# Patient Record
Sex: Male | Born: 1945 | Hispanic: Yes | Smoking: Never smoker
Health system: Southern US, Community
[De-identification: ages and names within clinical notes are randomized; demographics above are authoritative.]

## PROBLEM LIST (undated history)

## (undated) DIAGNOSIS — E119 Type 2 diabetes mellitus without complications: Secondary | ICD-10-CM

## (undated) DIAGNOSIS — B89 Unspecified parasitic disease: Secondary | ICD-10-CM

## (undated) DIAGNOSIS — F32A Depression, unspecified: Secondary | ICD-10-CM

## (undated) HISTORY — PX: NO PAST SURGERIES: SHX2092

---

## 2002-07-18 ENCOUNTER — Encounter: Payer: Self-pay | Admitting: Emergency Medicine

## 2002-07-18 ENCOUNTER — Emergency Department (HOSPITAL_COMMUNITY): Admission: EM | Admit: 2002-07-18 | Discharge: 2002-07-18 | Payer: Self-pay | Admitting: Emergency Medicine

## 2007-05-05 ENCOUNTER — Emergency Department (HOSPITAL_COMMUNITY): Admission: EM | Admit: 2007-05-05 | Discharge: 2007-05-06 | Payer: Self-pay | Admitting: Emergency Medicine

## 2007-05-05 IMAGING — CT CT PELVIS W/O CM
3 of 4 series · 13 of 42 positions shown, 19 images · IV contrast (agent unspecified)
Comparison: None available.

CLINICAL DATA: Abdominal and pelvic pain.  Right flank pain.
ABDOMEN CT WITHOUT CONTRAST:
TECHNIQUE: Multidetector CT imaging of the abdomen was performed following the standard protocol without IV contrast.
TECHNIQUE: Multidetector CT imaging of the pelvis was performed following the standard protocol without IV contrast.

[Series 2: routine abdomen · axial · 0.70mm/px · z∈[-406,-112]mm · 8 of 77 slices shown, 13 images]
[im 9/77  soft-tissue]
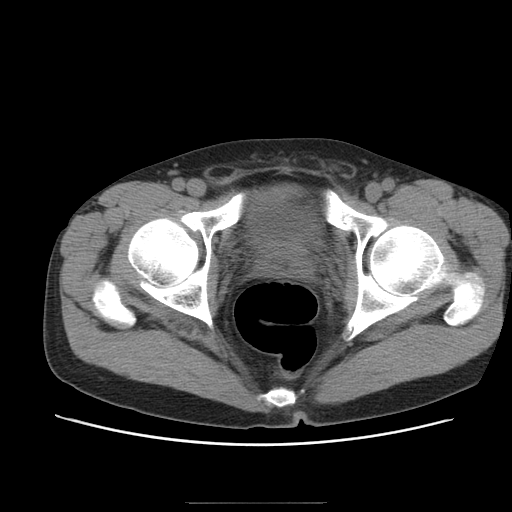
[im 9/77  bone]
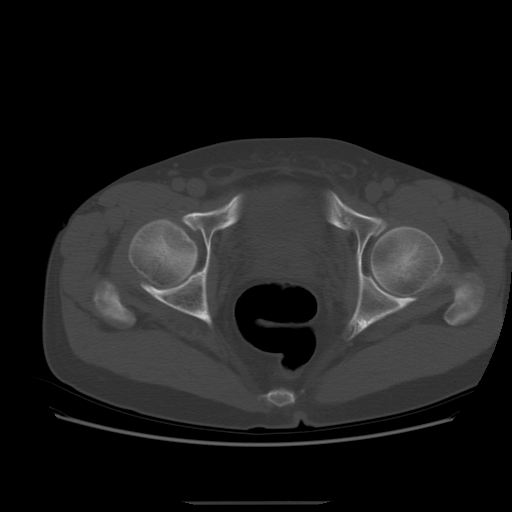
[im 17/77  soft-tissue]
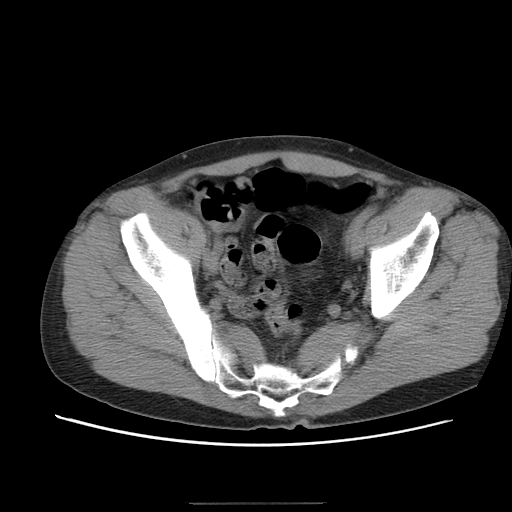
[im 26/77  soft-tissue]
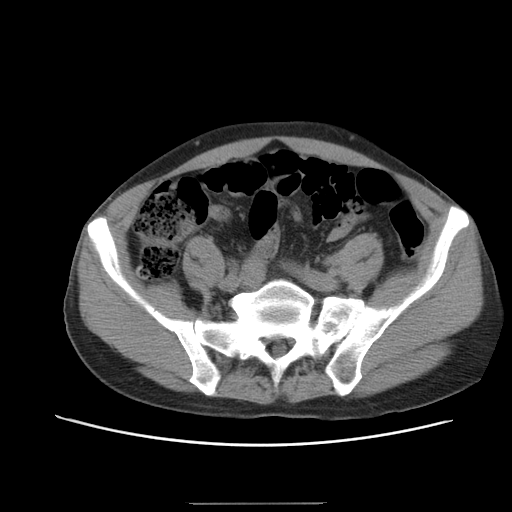
[im 34/77  soft-tissue]
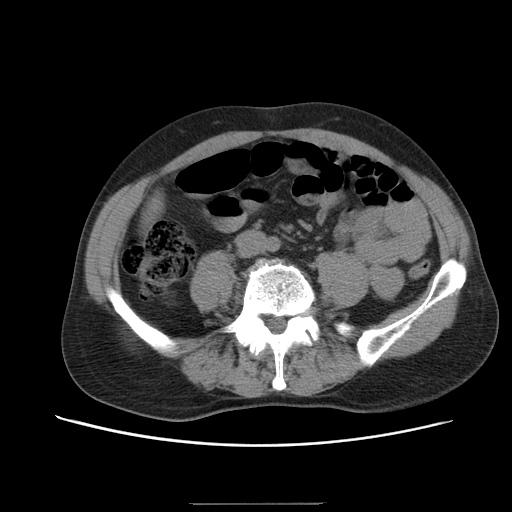
[im 43/77  soft-tissue]
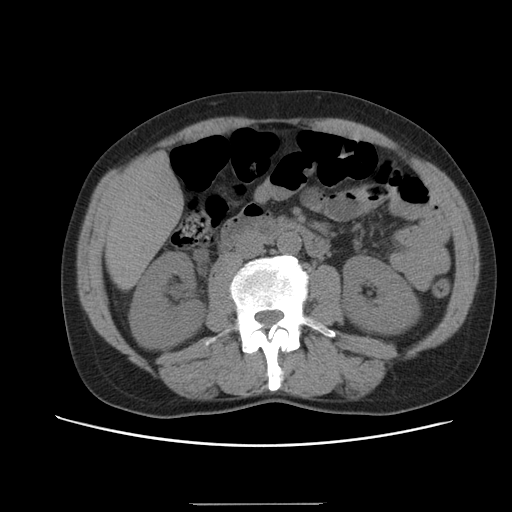
[im 43/77  lung]
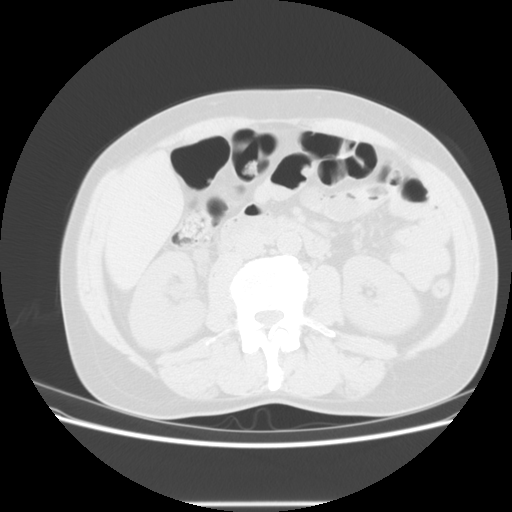
[im 51/77  soft-tissue]
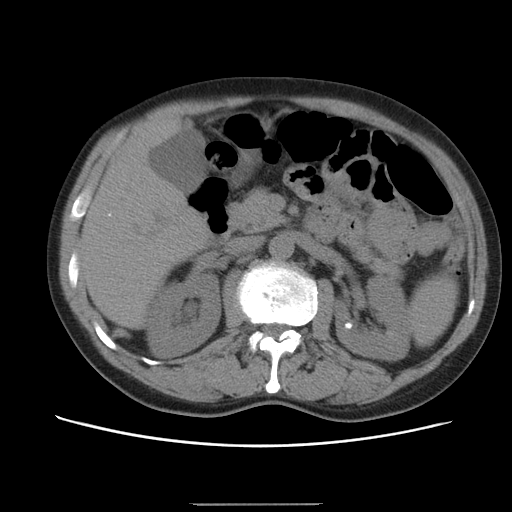
[im 51/77  lung]
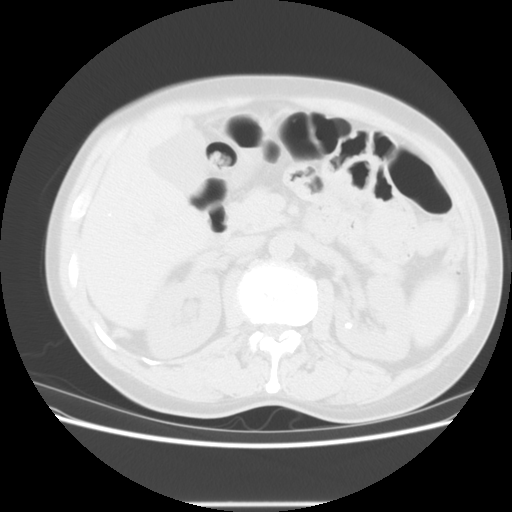
[im 60/77  soft-tissue]
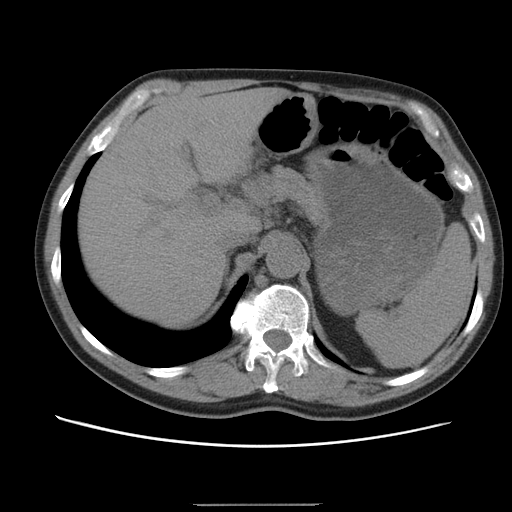
[im 60/77  lung]
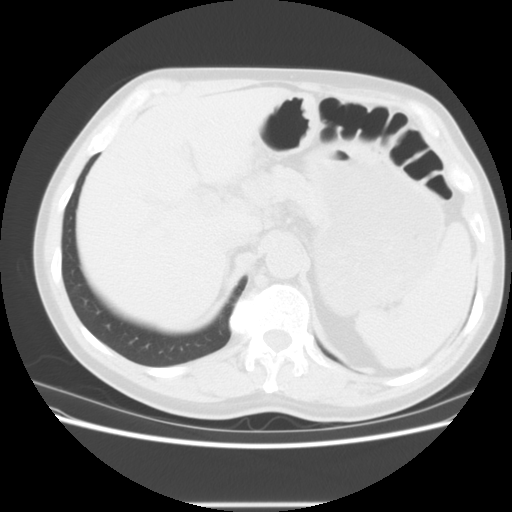
[im 68/77  soft-tissue]
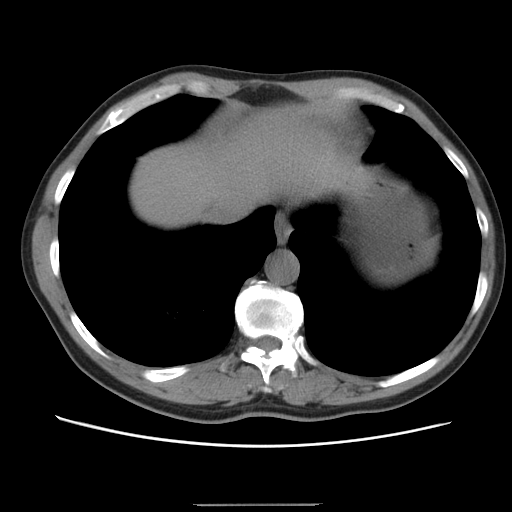
[im 68/77  lung]
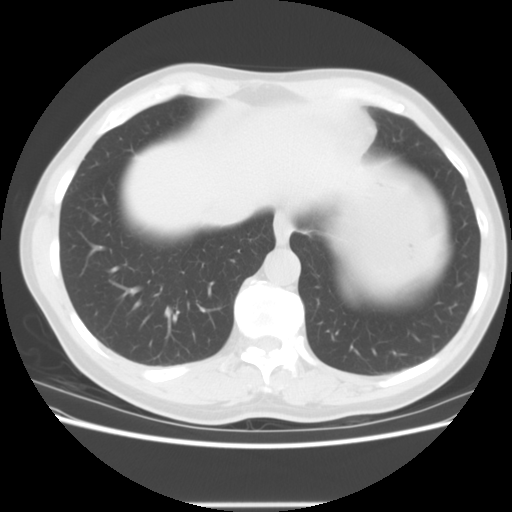

[Series 400: sag · sagittal · 0.82mm/px · 3 of 164 slices shown, 4 images]
[im 55/164  soft-tissue]
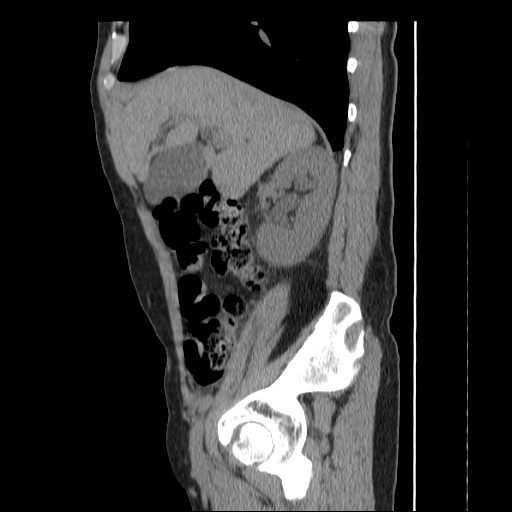
[im 73/164  soft-tissue]
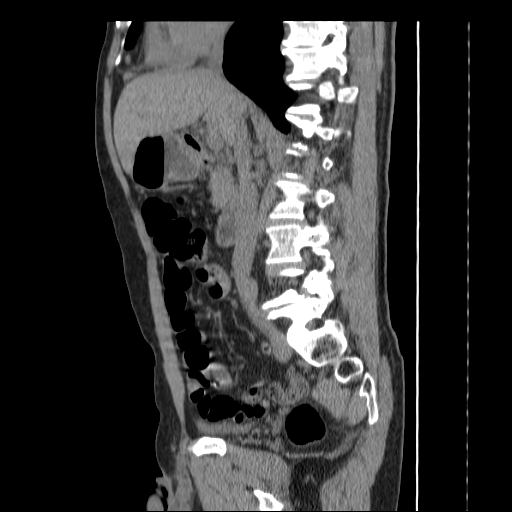
[im 73/164  bone]
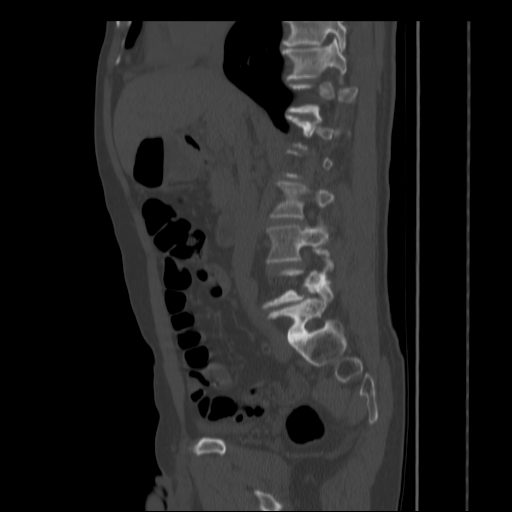
[im 91/164  soft-tissue]
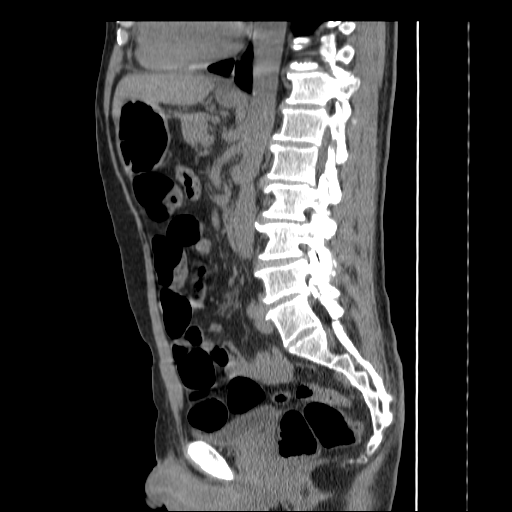

[Series 401: cor · coronal · 0.82mm/px · 2 of 128 slices shown]
[im 15/128  soft-tissue]
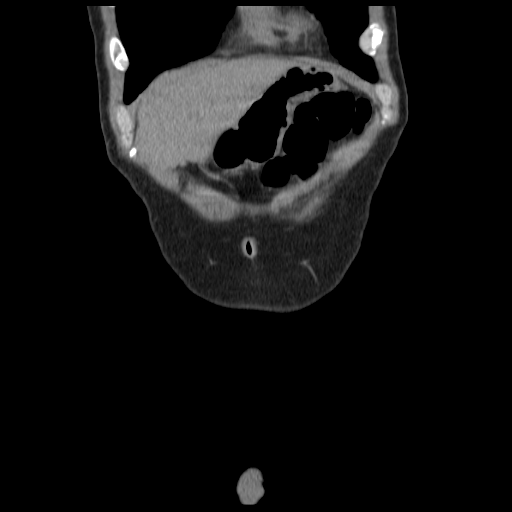
[im 30/128  soft-tissue]
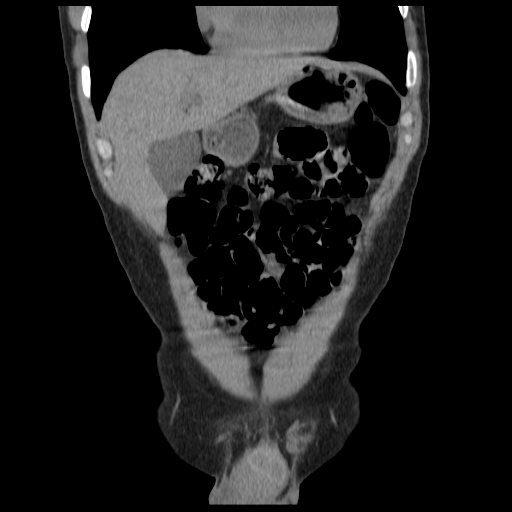

[13 of 42 positions shown; findings below may reference images not displayed]

FINDINGS: The liver, spleen, adrenal glands, pancreas, gallbladder are unremarkable.  There is a 5 mm nonobstructing calcification within the left mid kidney, but the remainder of the left kidney is unremarkable.  There is mild right hydronephrosis caused by a 1.5 mm mid right ureteral calculus (image 46).  No evidence of free fluid, enlarged lymph nodes, abdominal aortic aneurysm, or biliary dilatation.  The visualized bowel is within normal limits.  Please note that parenchymal abnormalities may be missed as intravenous contrast was not administered.
IMPRESSION: 1.5 mm mid right ureteral calculus with mild right hydronephrosis. 
PELVIS CT WITHOUT CONTRAST:
FINDINGS: The bladder and bowel are within normal limits.  No evidence of free fluid or enlarged lymph nodes.  The appendix is unremarkable.  Moderate-severe degenerative changes throughout the lumbar spine are noted.
IMPRESSION: 1. No acute abnormality within the pelvis.
2. Moderate to severe degenerative changes within the lumbar spine.

## 2011-08-01 LAB — CBC
HCT: 40
Hemoglobin: 13.5
MCHC: 33.7
MCV: 95.9
RDW: 11.9

## 2011-08-01 LAB — URINALYSIS, ROUTINE W REFLEX MICROSCOPIC
Bilirubin Urine: NEGATIVE
Glucose, UA: NEGATIVE
Ketones, ur: 15 — AB
Leukocytes, UA: NEGATIVE
Nitrite: NEGATIVE
Protein, ur: NEGATIVE
Specific Gravity, Urine: 1.017
Urobilinogen, UA: 0.2
pH: 7.5

## 2011-08-01 LAB — DIFFERENTIAL
Basophils Absolute: 0
Basophils Relative: 0
Eosinophils Absolute: 0.1
Eosinophils Relative: 2
Monocytes Absolute: 0.5
Neutro Abs: 4.2

## 2011-08-01 LAB — I-STAT 8, (EC8 V) (CONVERTED LAB)
Acid-Base Excess: 1
TCO2: 28
pCO2, Ven: 45.1
pH, Ven: 7.378 — ABNORMAL HIGH

## 2011-08-01 LAB — URINE MICROSCOPIC-ADD ON

## 2016-02-08 ENCOUNTER — Emergency Department (HOSPITAL_COMMUNITY): Payer: Medicare Other

## 2016-02-08 ENCOUNTER — Encounter (HOSPITAL_COMMUNITY): Payer: Self-pay | Admitting: Emergency Medicine

## 2016-02-08 ENCOUNTER — Emergency Department (HOSPITAL_COMMUNITY)
Admission: EM | Admit: 2016-02-08 | Discharge: 2016-02-08 | Disposition: A | Discharge status: Home / Self Care | Payer: Medicare Other | Attending: Emergency Medicine | Admitting: Emergency Medicine

## 2016-02-08 DIAGNOSIS — R200 Anesthesia of skin: Secondary | ICD-10-CM | POA: Diagnosis not present

## 2016-02-08 DIAGNOSIS — R079 Chest pain, unspecified: Secondary | ICD-10-CM | POA: Insufficient documentation

## 2016-02-08 DIAGNOSIS — Z79899 Other long term (current) drug therapy: Secondary | ICD-10-CM | POA: Insufficient documentation

## 2016-02-08 DIAGNOSIS — M5441 Lumbago with sciatica, right side: Secondary | ICD-10-CM | POA: Insufficient documentation

## 2016-02-08 DIAGNOSIS — Z7982 Long term (current) use of aspirin: Secondary | ICD-10-CM | POA: Insufficient documentation

## 2016-02-08 DIAGNOSIS — M79604 Pain in right leg: Secondary | ICD-10-CM | POA: Insufficient documentation

## 2016-02-08 DIAGNOSIS — M545 Low back pain: Secondary | ICD-10-CM | POA: Diagnosis present

## 2016-02-08 LAB — BASIC METABOLIC PANEL
ANION GAP: 9 (ref 5–15)
BUN: 14 mg/dL (ref 6–20)
CALCIUM: 9.7 mg/dL (ref 8.9–10.3)
CHLORIDE: 101 mmol/L (ref 101–111)
CO2: 25 mmol/L (ref 22–32)
Creatinine, Ser: 0.75 mg/dL (ref 0.61–1.24)
GFR calc non Af Amer: >60 mL/min (ref >60)
Glucose, Bld: 111 mg/dL — ABNORMAL HIGH (ref 65–99)
Potassium: 4.3 mmol/L (ref 3.5–5.1)
Sodium: 135 mmol/L (ref 135–145)

## 2016-02-08 LAB — CBC
HCT: 39.4 % (ref 39.0–52.0)
HEMOGLOBIN: 14 g/dL (ref 13.0–17.0)
MCH: 32.2 pg (ref 26.0–34.0)
MCHC: 35.5 g/dL (ref 30.0–36.0)
MCV: 90.6 fL (ref 78.0–100.0)
Platelets: 101 10*3/uL — ABNORMAL LOW (ref 150–400)
RBC: 4.35 MIL/uL (ref 4.22–5.81)
RDW: 12.1 % (ref 11.5–15.5)
WBC: 4.8 10*3/uL (ref 4.0–10.5)

## 2016-02-08 LAB — I-STAT TROPONIN, ED: TROPONIN I, POC: 0 ng/mL (ref 0.00–0.08)

## 2016-02-08 IMAGING — DX DG CHEST 2V
2 series · 2 of 2 positions shown · non-contrast
Comparison: None.

CLINICAL DATA: Left chest pain.

EXAM:
CHEST  2 VIEW

[chest pa]
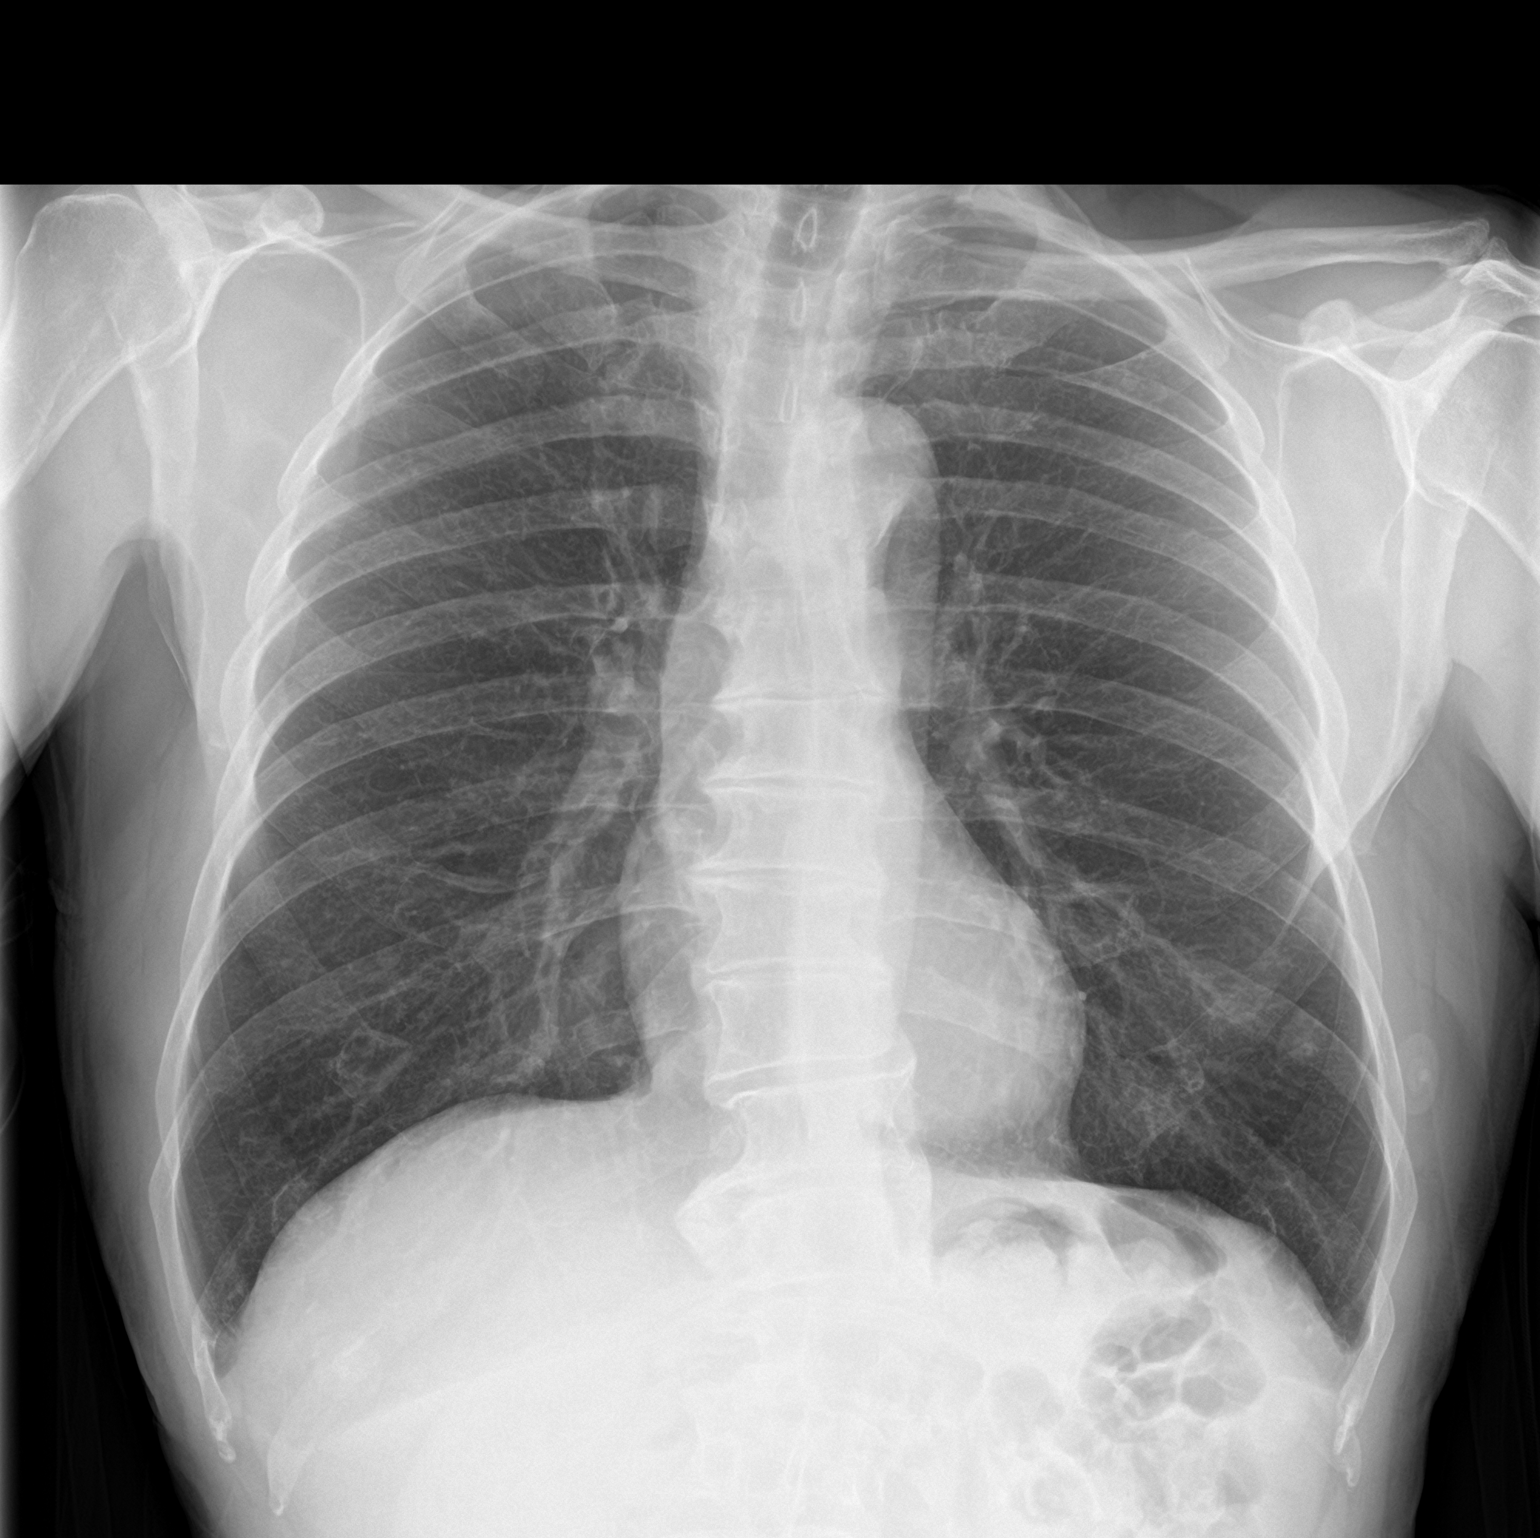

[chest lat]
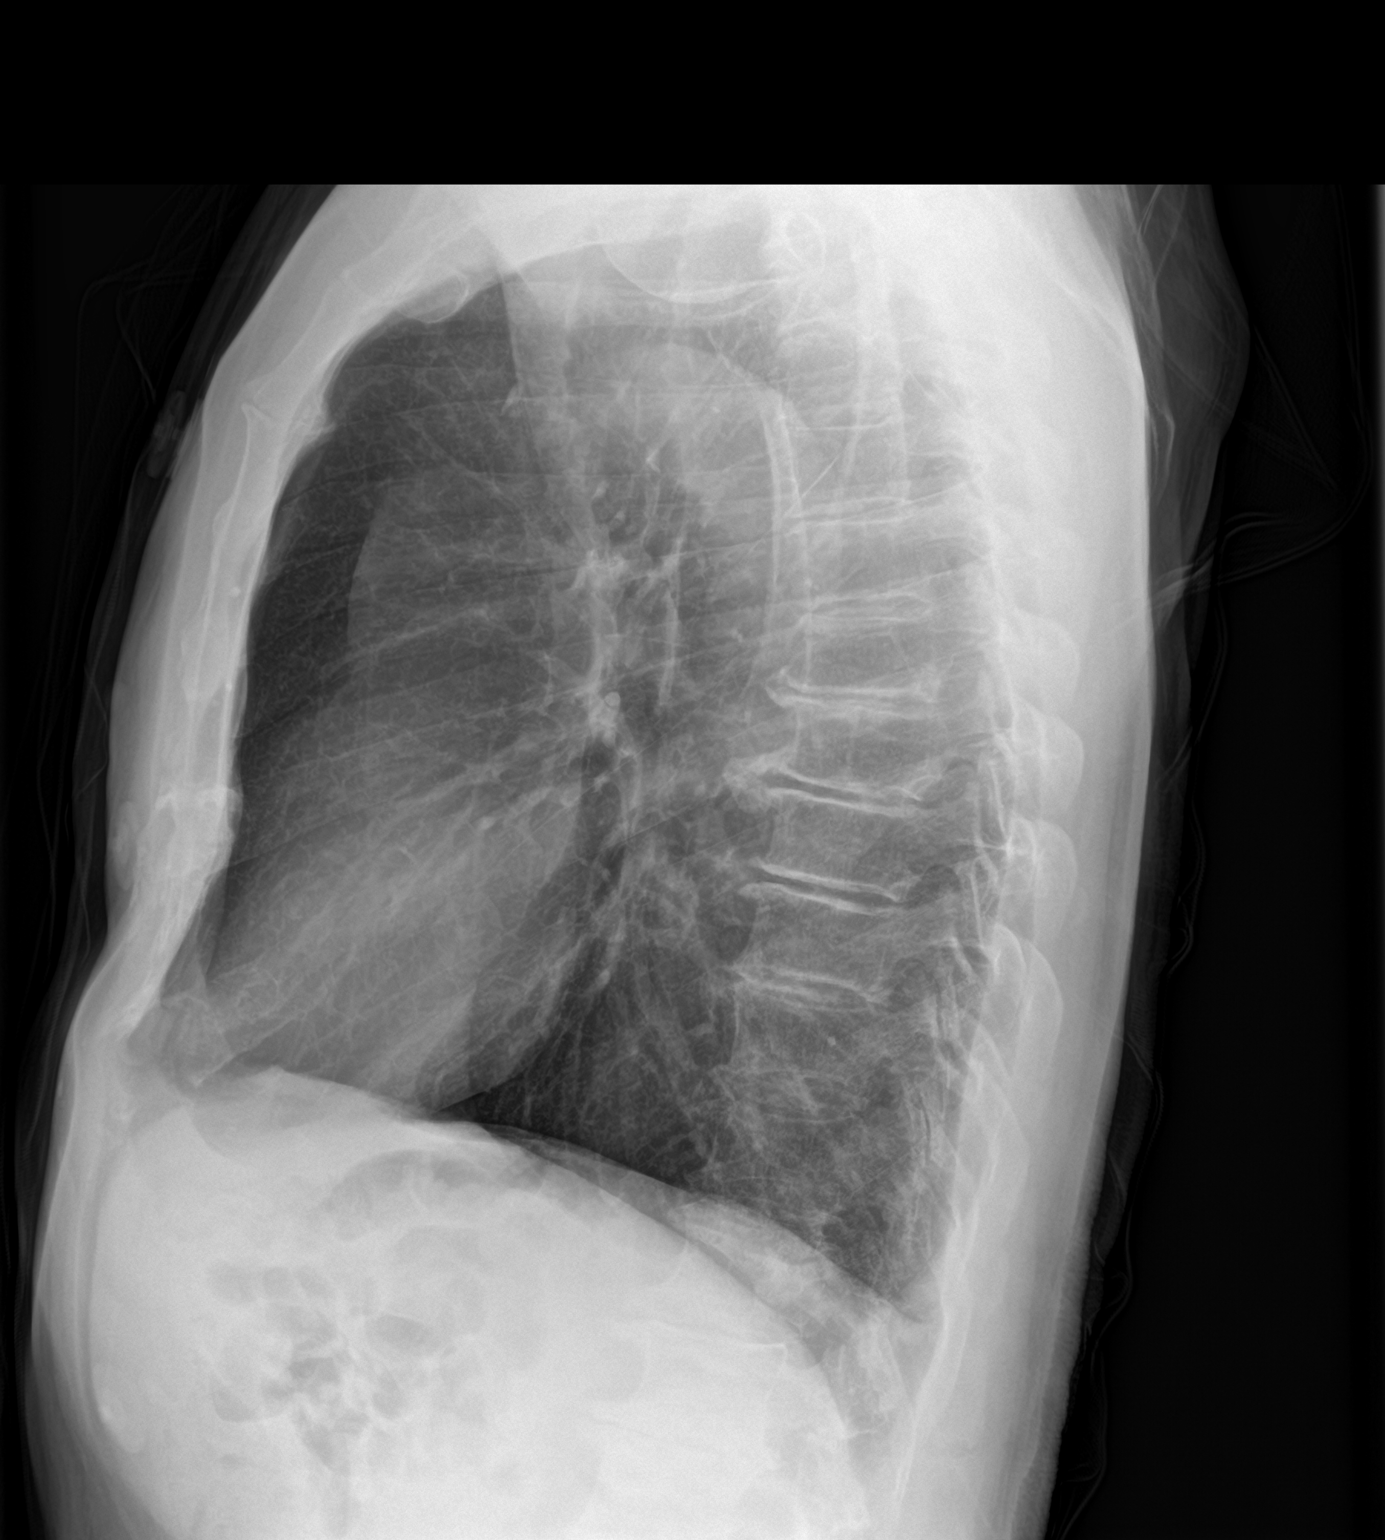

[2 of 2 positions shown; findings below may reference images not displayed]

FINDINGS: The heart size and mediastinal contours are within normal limits.
Both lungs are clear. The visualized skeletal structures are
unremarkable.
IMPRESSION: No active cardiopulmonary disease.

## 2016-02-08 IMAGING — CR DG LUMBAR SPINE COMPLETE 4+V
5 series · 5 of 5 positions shown · non-contrast
Comparison: CT of the abdomen and pelvis performed [DATE]

CLINICAL DATA: Acute onset of lower back pain, radiating down the
right leg. Initial encounter.

EXAM:
LUMBAR SPINE - COMPLETE 4+ VIEW

[t lumbar spine ap]
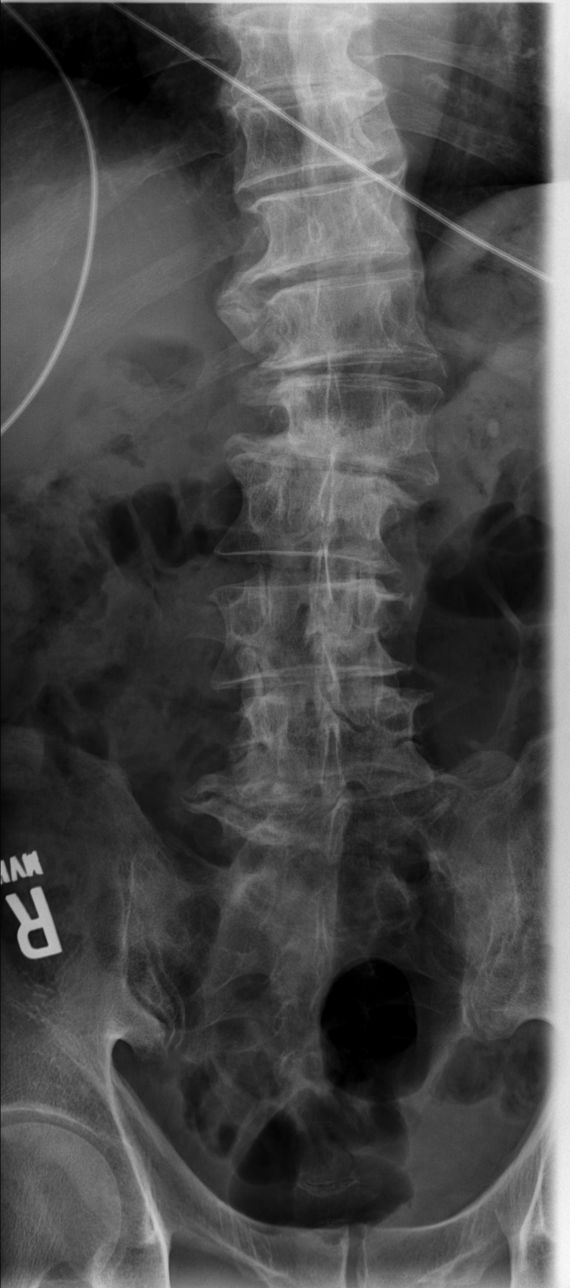

[t lumbar spine obl (1 of 2)]
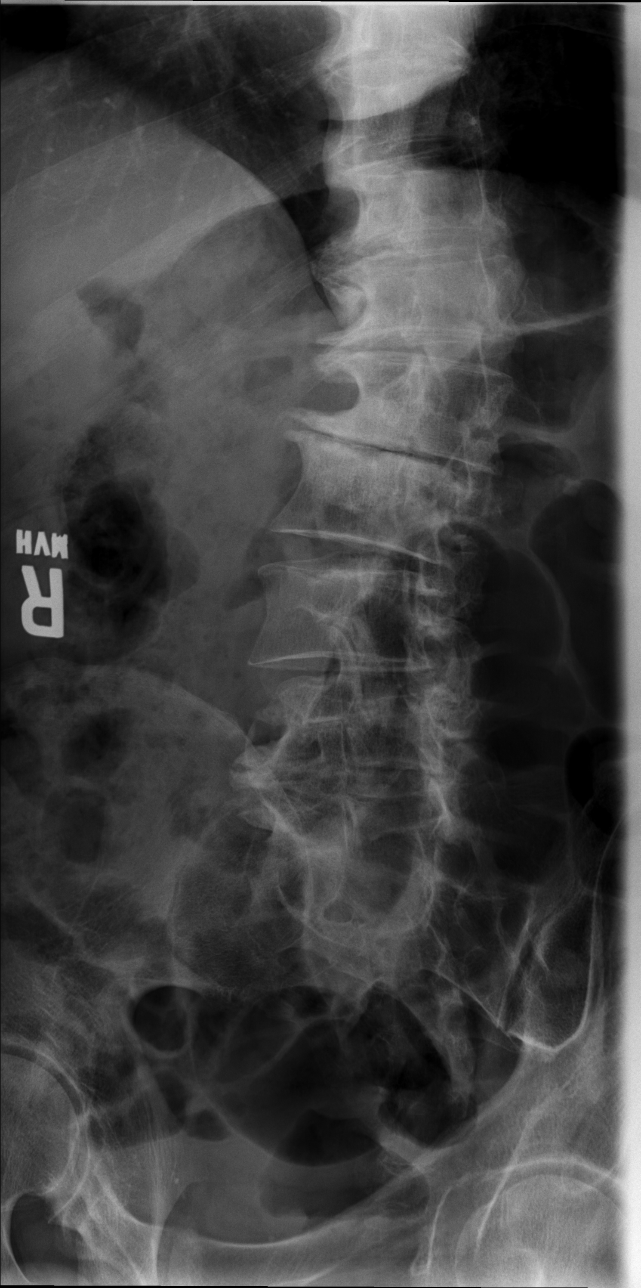

[t lumbar spine obl (2 of 2)]
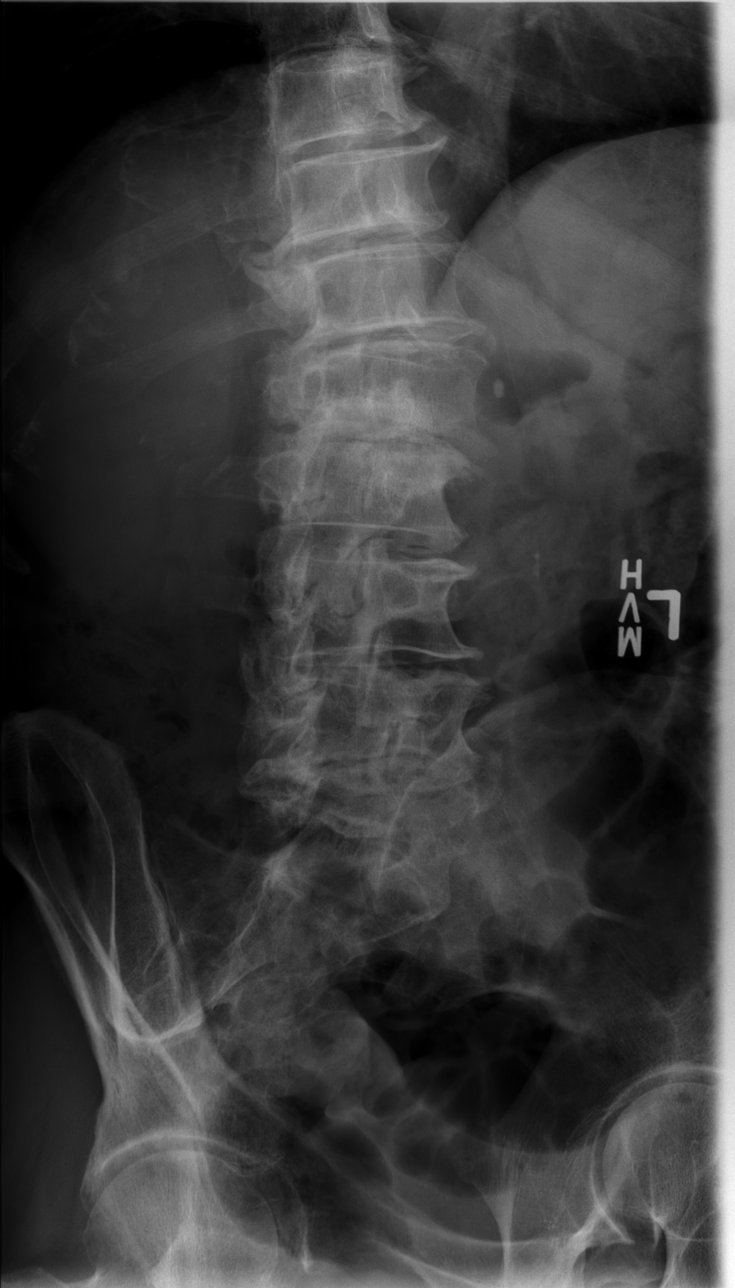

[t lumbar spine lat]
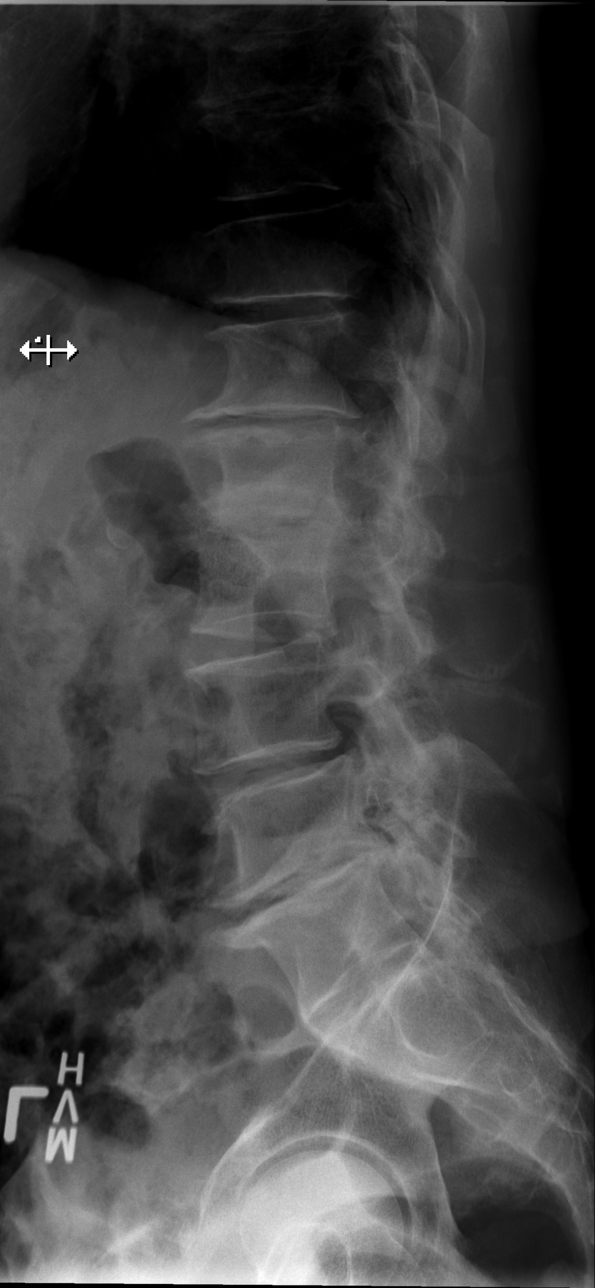

[t lumbar l-5 s-1 spot]
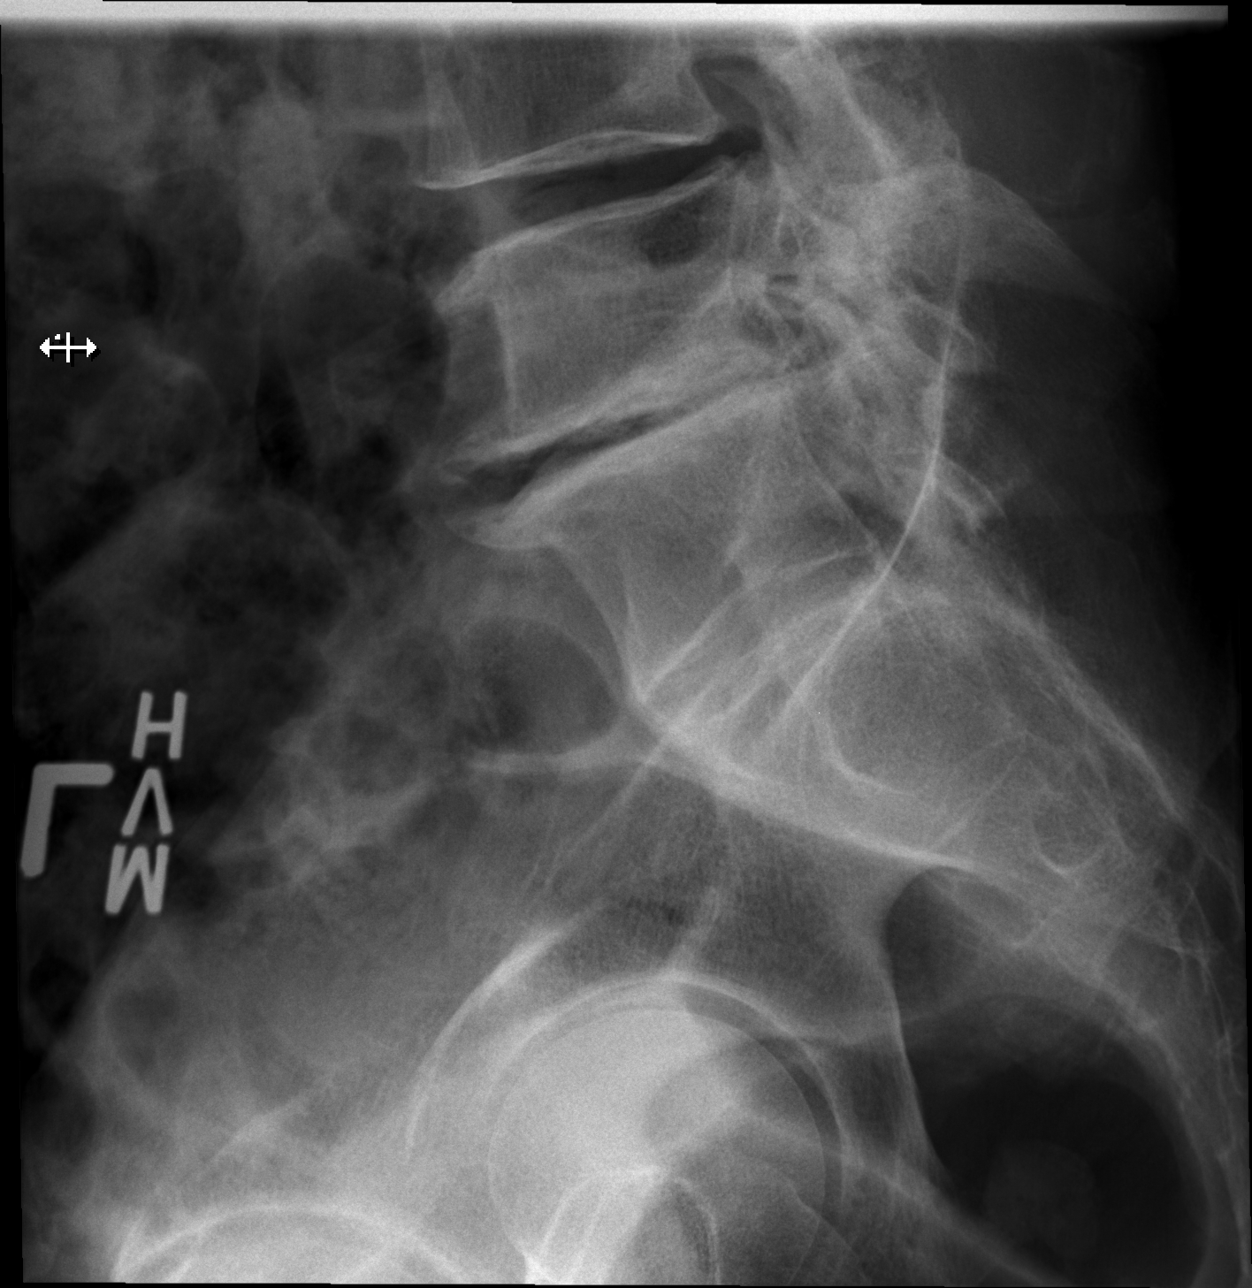

[5 of 5 positions shown; findings below may reference images not displayed]

FINDINGS: There is no evidence of acute fracture or subluxation. Vertebral
bodies demonstrate normal height. There is slight chronic lateral
subluxation at the upper lumbar spine. Lateral and anterior
osteophytes are noted along the lower thoracic and lumbar spine.
Underlying facet disease is noted.

The visualized bowel gas pattern is unremarkable in appearance; air
and stool are noted within the colon. The sacroiliac joints are
within normal limits.
IMPRESSION: 1. No evidence of acute fracture or subluxation along the lumbar
spine.
2. Mild degenerative change along the lumbar spine.

## 2016-02-08 MED ORDER — HYDROCODONE-ACETAMINOPHEN 5-325 MG PO TABS: 1.0000 | ORAL_TABLET | ORAL | Status: DC | PRN

## 2016-02-08 MED ORDER — HYDROCODONE-ACETAMINOPHEN 5-325 MG PO TABS
1.0000 | ORAL_TABLET | Freq: Once | ORAL | Status: AC
  Administered 2016-02-08: 1 via ORAL
  Filled 2016-02-08: qty 1

## 2016-02-08 MED ORDER — IBUPROFEN 600 MG PO TABS: 600.0000 mg | ORAL_TABLET | Freq: Four times a day (QID) | ORAL | Status: DC | PRN

## 2016-02-08 NOTE — ED Notes (Signed)
Bed: WTR7 Expected date:  Expected time:  Means of arrival:  Comments: Blood draw

## 2016-02-08 NOTE — ED Notes (Addendum)
Patient states he's been having right leg numbness and intermittent chest pain for "a long time." Also c/o having not slept for 5 weeks. Decided to come to the ER because he woke up in a cold sweat last night with SOB. In no obvious distress in triage, no major neuro deficits observed in triage. Family hx of diabetes, pt doesn't have a PCP, hasn't been to one in "a long time."

## 2016-02-08 NOTE — Discharge Instructions (Signed)
Take your medications as prescribed. I also recommend refraining from doing any shortness activity, heavy lifting or exacerbating movements that worsens your pain. I also recommend applying ice and/or heat to affected area for 15-20 minutes 3-4 times daily. Please follow up with a primary care provider from the Resource Guide provided below in 1 week. Please return to the Emergency Department if symptoms worsen or new onset of fever, numbness, tingling, groin numbness, loss of bowel or bladder, weakness.

## 2016-02-08 NOTE — ED Provider Notes (Signed)
CSN: [id number]     Arrival date & time 02/08/16  1352 History   First MD Initiated Contact with Patient 02/08/16 1900     Chief Complaint  Patient presents with  . Chest Pain  . Numbness     (Consider location/radiation/quality/duration/timing/severity/associated sxs/prior Treatment) HPI   Patient is a 70-year-old male with pertinent past medical history presents the ED with complaint of back pain. Patient reports he has had intermittent aching pain to his right lower back for the past few months which he notes radiates down the back of his right leg. He notes pain is worse with ambulation or bending. He also endorses having associated intermittent tingling to his right leg which is worse with ambulating or bending. Denies any aggravating or alleviating factors. Denies any recent falls, trauma or injury however pt reports he lifts heavy pieces of wood daily and he states he thinks his pain worsened after lifting a large piece of wood a few months ago. Pt denies fever, numbness, tingling, saddle anesthesia, loss of bowel or bladder, abdominal pain, N/V, urinary retention, weakness, IVDU, cancer or recent spinal manipulation. Patient denies taking any medications at home. Patient notes he has been having difficulty sleeping due to pain.  Patient also reports having intermittent left-sided chest pain which she describes as feeling like his heart is "beating fast". He states the chest discomfort will last for a few seconds to a few minutes. He notes when he lays down in bed and tries to relax and the chest pain will spontaneously resolved. He states he started having these episodes once his pain worsened resulting in him having difficulty sleeping. He notes he thinks these episodes occur when he becomes worried about his back pain and then resolve when he is no longer worried or stressed. Denies fever, chills, HA, cough, SOB, wheezing, abdominal pain, diaphoresis, lightheadedness, dizziness. Denies  cardiac hx or hx of smoking.  History reviewed. No pertinent past medical history. History reviewed. No pertinent past surgical history. History reviewed. No pertinent family history. Social History  Substance Use Topics  . Smoking status: None  . Smokeless tobacco: None  . Alcohol Use: None    Review of Systems  Musculoskeletal: Positive for myalgias (right leg) and back pain.  Neurological: Positive for numbness.  All other systems reviewed and are negative.     Allergies  Review of patient's allergies indicates no known allergies.  Home Medications   Prior to Admission medications   Medication Sig Start Date End Date Taking? Authorizing Provider  aspirin EC 325 MG tablet Take 325 mg by mouth daily.   Yes Historical Provider, MD  Multiple Vitamin (MULTIVITAMIN WITH MINERALS) TABS tablet Take 1 tablet by mouth daily.   Yes Historical Provider, MD  POTASSIUM GLUCONATE PO Take 1 tablet by mouth daily.   Yes Historical Provider, MD  HYDROcodone-acetaminophen (NORCO/VICODIN) 5-325 MG tablet Take 1 tablet by mouth every 4 (four) hours as needed. 02/08/16   Nona Dell, PA-C  ibuprofen (ADVIL,MOTRIN) 600 MG tablet Take 1 tablet (600 mg total) by mouth every 6 (six) hours as needed. 02/08/16   Chesley Noon Debora [place], PA-C   BP 162/89 mmHg  Pulse 75  Temp(Src) 98.7 F (37.1 C) (Oral)  Resp 15  SpO2 100% Physical Exam  Constitutional: He is oriented to person, place, and time. He appears well-developed and well-nourished. No distress.  HENT:  Head: Normocephalic and atraumatic.  Mouth/Throat: Oropharynx is clear and moist. No oropharyngeal exudate.  Eyes: Conjunctivae and EOM are  normal. Pupils are equal, round, and reactive to light. Right eye exhibits no discharge. Left eye exhibits no discharge. No scleral icterus.  Neck: Normal range of motion. Neck supple.  Cardiovascular: Normal rate, regular rhythm, normal heart sounds and intact distal pulses.    Pulmonary/Chest: Effort normal and breath sounds normal. No respiratory distress. He has no wheezes. He has no rales. He exhibits no tenderness.  Abdominal: Soft. Bowel sounds are normal. He exhibits no distension and no mass. There is no tenderness. There is no rebound and no guarding.  Musculoskeletal: Normal range of motion. He exhibits no edema.  No midline C, T, or L tenderness. Full range of motion of neck and back however pt endorses pain down right leg with ambulating and bending. Full range of motion of bilateral upper and lower extremities, with 5/5 strength. Positive right straight leg raise. Sensation intact. 2+ radial and PT pulses. Cap refill <2 seconds. Patient able to stand and ambulate without assistance.    Neurological: He is alert and oriented to person, place, and time. He has normal strength and normal reflexes. No sensory deficit.  Skin: Skin is warm and dry. He is not diaphoretic.  Nursing note and vitals reviewed.   ED Course  Procedures (including critical care time) Labs Review Labs Reviewed  BASIC METABOLIC PANEL - Abnormal; Notable for the following:    Glucose, Bld 111 (*)    All other components within normal limits  CBC - Abnormal; Notable for the following:    Platelets 101 (*)    All other components within normal limits  I-STAT TROPOININ, ED    Imaging Review Dg Chest 2 View  02/08/2016  CLINICAL DATA:  Left chest pain. EXAM: CHEST  2 VIEW COMPARISON:  None. FINDINGS: The heart size and mediastinal contours are within normal limits. Both lungs are clear. The visualized skeletal structures are unremarkable. IMPRESSION: No active cardiopulmonary disease. Electronically Signed   By: Rolm Baptise M.D.   On: 02/08/2016 15:04   Dg Lumbar Spine Complete  02/08/2016  CLINICAL DATA:  Acute onset of lower back pain, radiating down the right leg. Initial encounter. EXAM: LUMBAR SPINE - COMPLETE 4+ VIEW COMPARISON:  CT of the abdomen and pelvis performed 05/05/2007  FINDINGS: There is no evidence of acute fracture or subluxation. Vertebral bodies demonstrate normal height. There is slight chronic lateral subluxation at the upper lumbar spine. Lateral and anterior osteophytes are noted along the lower thoracic and lumbar spine. Underlying facet disease is noted. The visualized bowel gas pattern is unremarkable in appearance; air and stool are noted within the colon. The sacroiliac joints are within normal limits. IMPRESSION: 1. No evidence of acute fracture or subluxation along the lumbar spine. 2. Mild degenerative change along the lumbar spine. Electronically Signed   By: Garald Balding M.D.   On: 02/08/2016 20:21   I have personally reviewed and evaluated these images and lab results as part of my medical decision-making.   EKG Interpretation   Date/Time:  Monday February 08 2016 14:24:30 EDT Ventricular Rate:  83 PR Interval:  150 QRS Duration: 102 QT Interval:  398 QTC Calculation: 468 R Axis:   -63 Text Interpretation:  Sinus rhythm Left axis deviation RSR' in V1 or V2,  probably normal variant Nonspecific repol abnormality, lateral leads  Baseline wander in lead(s) I II III aVL aVF V1 V2 V3 V4 V6 Confirmed by  KNOTT MD, DANIEL [id number]) on 02/08/2016 9:43:36 PM      MDM   Final  diagnoses:  Right-sided low back pain with right-sided sciatica    Patient presents with right-sided lower back pain that she has had for the past few months. Denies any recent fall, trauma, injury however he endorses lifting heavy pieces of wood daily for work. No back pain red flags. Patient reports having intermittent episodes of left-sided chest pain that he states occurred when he is worried about his back pain and notes resolve spontaneously when he relaxes and is no longer worried. Denies cardiac hx. VSS. Exam revealed positive straight leg raise, no neuro deficits. Patient able to stand and ambulate without assistance. Patient given pain meds in the ED. EKG showed sinus  rhythm with left axis deviation. Trop negative. Labs unremarkable. Chest x-ray negative. Lumbar spine revealed mild degenerative changes, otherwise unremarkable. On reevaluation patient reports his pain has improved. I suspect pt's sxs are likely due to sciatica. I do not suspect spinal cord compression and do not feel that any further imaging is warranted at this time. Plan to d/c pt home with pain meds and symptomatic tx. patient given resources to follow up with PCP.    Chesley Noon [place], [place] 02/09/16 0120  Leo Grosser, MD 02/09/16 [phone]

## 2021-03-05 ENCOUNTER — Encounter (HOSPITAL_COMMUNITY): Payer: Self-pay

## 2021-03-05 ENCOUNTER — Other Ambulatory Visit: Payer: Self-pay

## 2021-03-05 ENCOUNTER — Emergency Department (HOSPITAL_COMMUNITY): Payer: Medicare Other

## 2021-03-05 ENCOUNTER — Inpatient Hospital Stay (HOSPITAL_COMMUNITY)
Admission: EM | Admit: 2021-03-05 | Discharge: 2021-03-08 | DRG: 436 | Disposition: A | Discharge status: Home Health | Payer: Medicare Other | Attending: Internal Medicine | Admitting: Internal Medicine

## 2021-03-05 DIAGNOSIS — I10 Essential (primary) hypertension: Secondary | ICD-10-CM | POA: Diagnosis present

## 2021-03-05 DIAGNOSIS — E8809 Other disorders of plasma-protein metabolism, not elsewhere classified: Secondary | ICD-10-CM | POA: Diagnosis present

## 2021-03-05 DIAGNOSIS — R1011 Right upper quadrant pain: Secondary | ICD-10-CM | POA: Diagnosis present

## 2021-03-05 DIAGNOSIS — E871 Hypo-osmolality and hyponatremia: Secondary | ICD-10-CM | POA: Diagnosis present

## 2021-03-05 DIAGNOSIS — C220 Liver cell carcinoma: Principal | ICD-10-CM

## 2021-03-05 DIAGNOSIS — Z7982 Long term (current) use of aspirin: Secondary | ICD-10-CM

## 2021-03-05 DIAGNOSIS — Z8614 Personal history of Methicillin resistant Staphylococcus aureus infection: Secondary | ICD-10-CM

## 2021-03-05 DIAGNOSIS — Z79899 Other long term (current) drug therapy: Secondary | ICD-10-CM

## 2021-03-05 DIAGNOSIS — Z8249 Family history of ischemic heart disease and other diseases of the circulatory system: Secondary | ICD-10-CM

## 2021-03-05 DIAGNOSIS — R160 Hepatomegaly, not elsewhere classified: Secondary | ICD-10-CM

## 2021-03-05 DIAGNOSIS — R52 Pain, unspecified: Secondary | ICD-10-CM

## 2021-03-05 DIAGNOSIS — Z20822 Contact with and (suspected) exposure to covid-19: Secondary | ICD-10-CM | POA: Diagnosis present

## 2021-03-05 HISTORY — DX: Unspecified parasitic disease: B89

## 2021-03-05 HISTORY — DX: Depression, unspecified: F32.A

## 2021-03-05 LAB — URINALYSIS, ROUTINE W REFLEX MICROSCOPIC
Bilirubin Urine: NEGATIVE
Glucose, UA: NEGATIVE mg/dL
Hgb urine dipstick: NEGATIVE
Ketones, ur: NEGATIVE mg/dL
Leukocytes,Ua: NEGATIVE
Nitrite: NEGATIVE
Protein, ur: NEGATIVE mg/dL
Specific Gravity, Urine: 1.017 (ref 1.005–1.030)
pH: 6 (ref 5.0–8.0)

## 2021-03-05 LAB — RESP PANEL BY RT-PCR (FLU A&B, COVID) ARPGX2
Influenza A by PCR: NEGATIVE
Influenza B by PCR: NEGATIVE
SARS Coronavirus 2 by RT PCR: NEGATIVE

## 2021-03-05 LAB — BASIC METABOLIC PANEL
Anion gap: 10 (ref 5–15)
BUN: 19 mg/dL (ref 8–23)
CO2: 24 mmol/L (ref 22–32)
Calcium: 9.7 mg/dL (ref 8.9–10.3)
Chloride: 99 mmol/L (ref 98–111)
Creatinine, Ser: 0.78 mg/dL (ref 0.61–1.24)
GFR, Estimated: >60 mL/min (ref >60)
Glucose, Bld: 96 mg/dL (ref 70–99)
Potassium: 3.7 mmol/L (ref 3.5–5.1)
Sodium: 133 mmol/L — ABNORMAL LOW (ref 135–145)

## 2021-03-05 LAB — APTT: aPTT: 29 seconds (ref 24–36)

## 2021-03-05 LAB — PROTIME-INR
INR: 1.1 (ref 0.8–1.2)
Prothrombin Time: 13.7 seconds (ref 11.4–15.2)

## 2021-03-05 LAB — CBC
HCT: 39.4 % (ref 39.0–52.0)
Hemoglobin: 13.7 g/dL (ref 13.0–17.0)
MCH: 32.5 pg (ref 26.0–34.0)
MCHC: 34.8 g/dL (ref 30.0–36.0)
MCV: 93.4 fL (ref 80.0–100.0)
Platelets: 130 10*3/uL — ABNORMAL LOW (ref 150–400)
RBC: 4.22 MIL/uL (ref 4.22–5.81)
RDW: 11.8 % (ref 11.5–15.5)
WBC: 5.6 10*3/uL (ref 4.0–10.5)
nRBC: 0 % (ref 0.0–0.2)

## 2021-03-05 LAB — MAGNESIUM: Magnesium: 2 mg/dL (ref 1.7–2.4)

## 2021-03-05 LAB — CBG MONITORING, ED: Glucose-Capillary: 91 mg/dL (ref 70–99)

## 2021-03-05 LAB — HEPATIC FUNCTION PANEL
ALT: 90 U/L — ABNORMAL HIGH (ref 0–44)
AST: 112 U/L — ABNORMAL HIGH (ref 15–41)
Albumin: 3.7 g/dL (ref 3.5–5.0)
Alkaline Phosphatase: 60 U/L (ref 38–126)
Bilirubin, Direct: 0.2 mg/dL (ref 0.0–0.2)
Indirect Bilirubin: 0.4 mg/dL (ref 0.3–0.9)
Total Bilirubin: 0.6 mg/dL (ref 0.3–1.2)
Total Protein: 8.2 g/dL — ABNORMAL HIGH (ref 6.5–8.1)

## 2021-03-05 LAB — HEMOGLOBIN A1C
Hgb A1c MFr Bld: 5.2 % (ref 4.8–5.6)
Mean Plasma Glucose: 102.54 mg/dL

## 2021-03-05 LAB — TROPONIN I (HIGH SENSITIVITY)
Troponin I (High Sensitivity): 4 ng/L (ref <18)
Troponin I (High Sensitivity): 5 ng/L (ref <18)

## 2021-03-05 LAB — D-DIMER, QUANTITATIVE: D-Dimer, Quant: 0.72 ug/mL-FEU — ABNORMAL HIGH (ref 0.00–0.50)

## 2021-03-05 LAB — TSH: TSH: 2.535 u[IU]/mL (ref 0.350–4.500)

## 2021-03-05 LAB — LACTIC ACID, PLASMA: Lactic Acid, Venous: 1.1 mmol/L (ref 0.5–1.9)

## 2021-03-05 LAB — LIPASE, BLOOD: Lipase: 34 U/L (ref 11–51)

## 2021-03-05 IMAGING — CT CT L SPINE W/O CM
3 series · 11 of 33 positions shown, 13 images · non-contrast
Comparison: Radiography [DATE]

CLINICAL DATA: Weight loss over the last month. Chest and abdominal
pain.

EXAM:
CT LUMBAR SPINE WITHOUT CONTRAST
TECHNIQUE: Multidetector CT imaging of the lumbar spine was performed without
intravenous contrast administration. Multiplanar CT image
reconstructions were also generated.

[Series 1: lspine coronal · coronal · 0.28mm/px · 3 of 68 slices shown]
[im 14/68  bone]
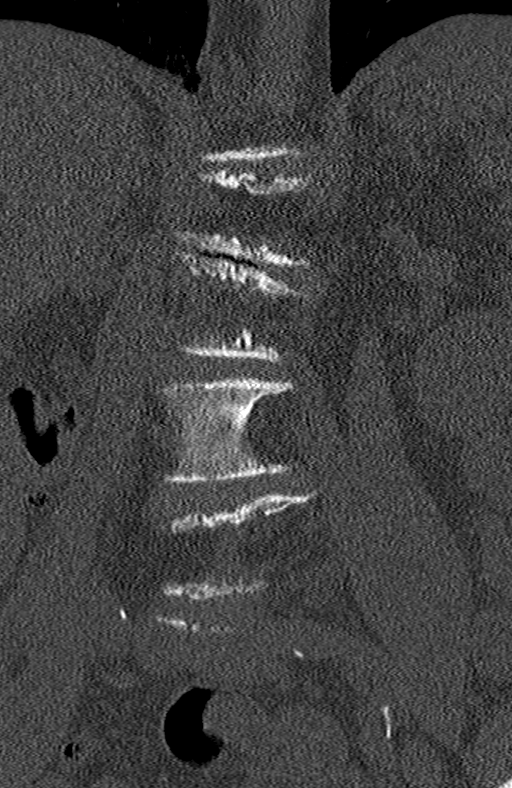
[im 27/68  bone]
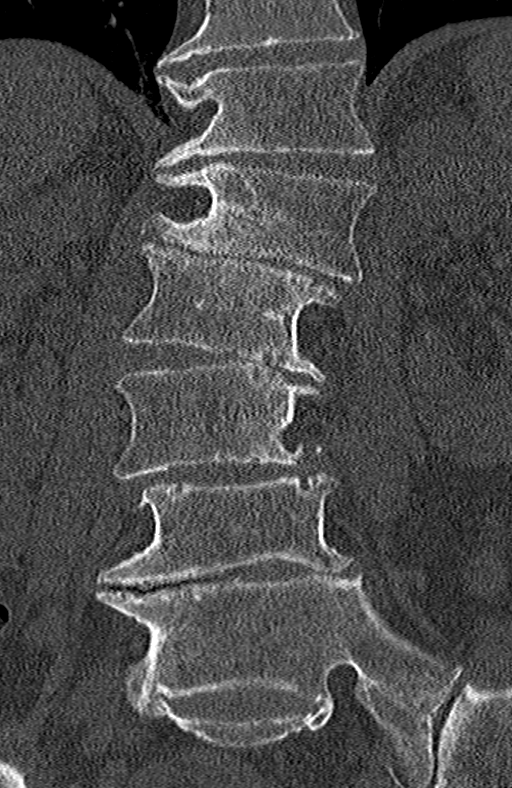
[im 41/68  bone]
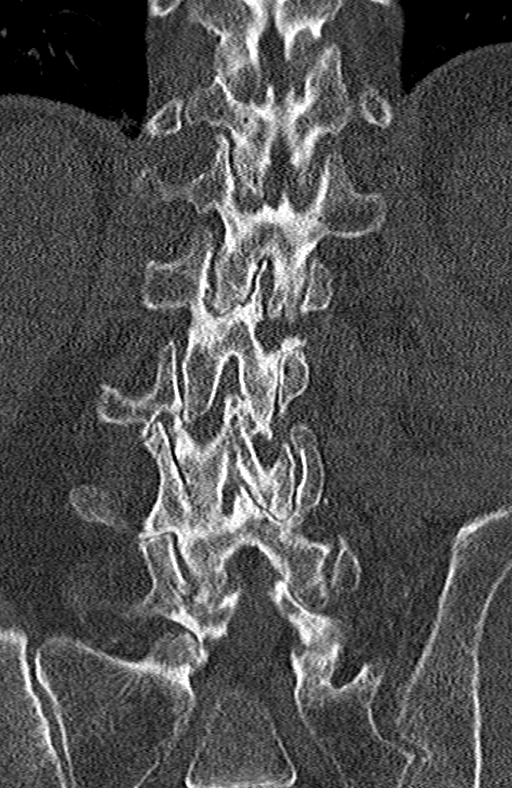

[Series 2: lspine sag · sagittal · 0.26mm/px · 5 of 71 slices shown, 6 images]
[im 24/71  bone]
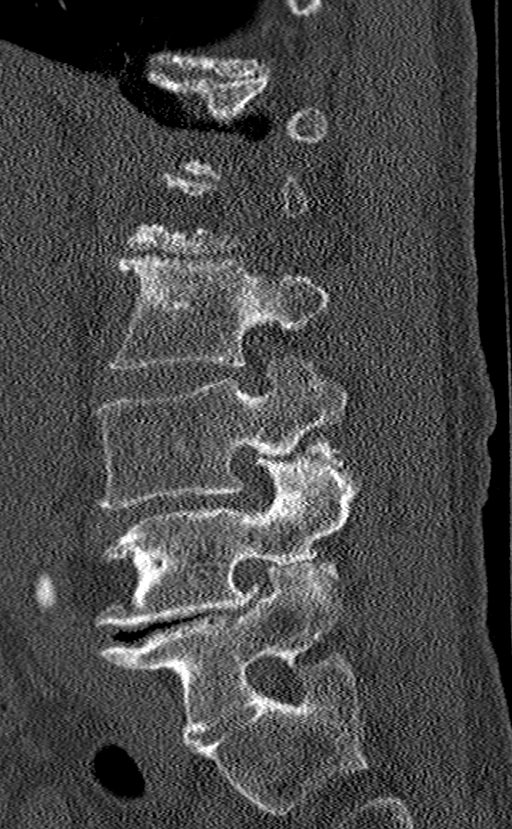
[im 30/71  bone]
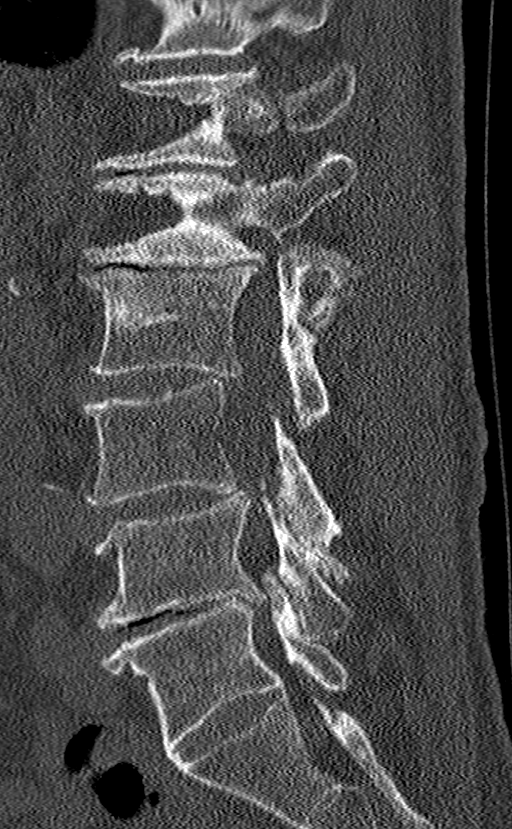
[im 36/71  soft-tissue]
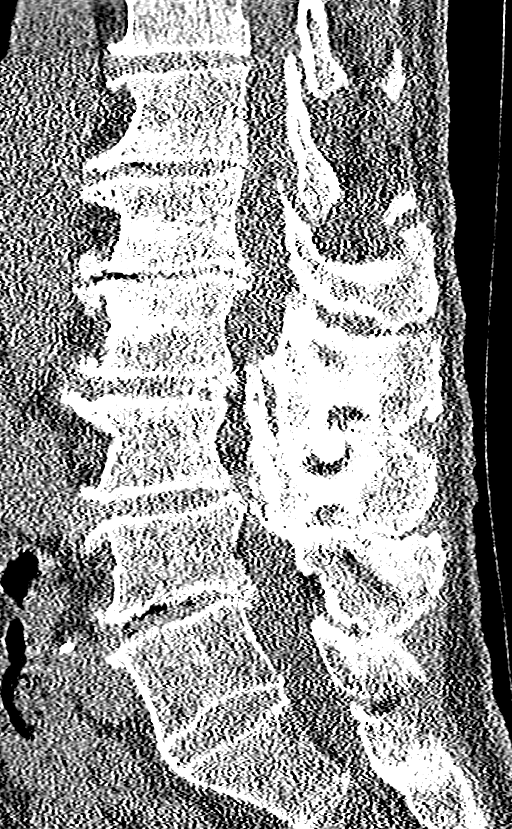
[im 36/71  bone]
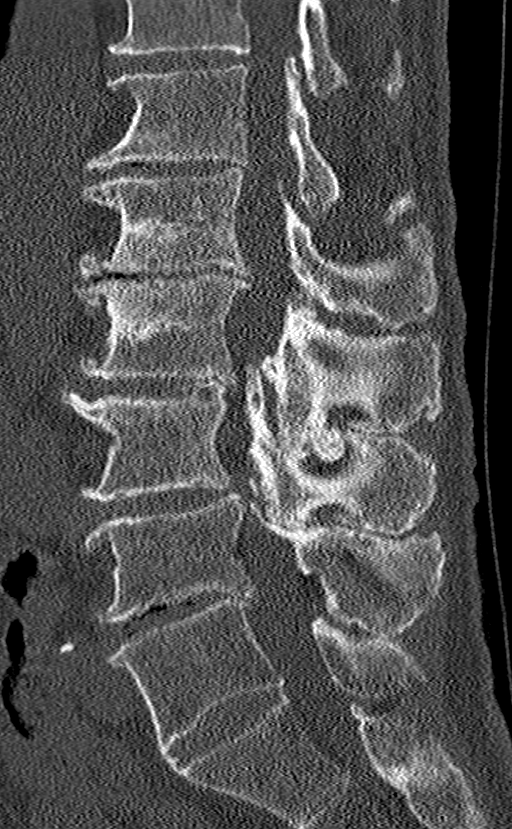
[im 41/71  bone]
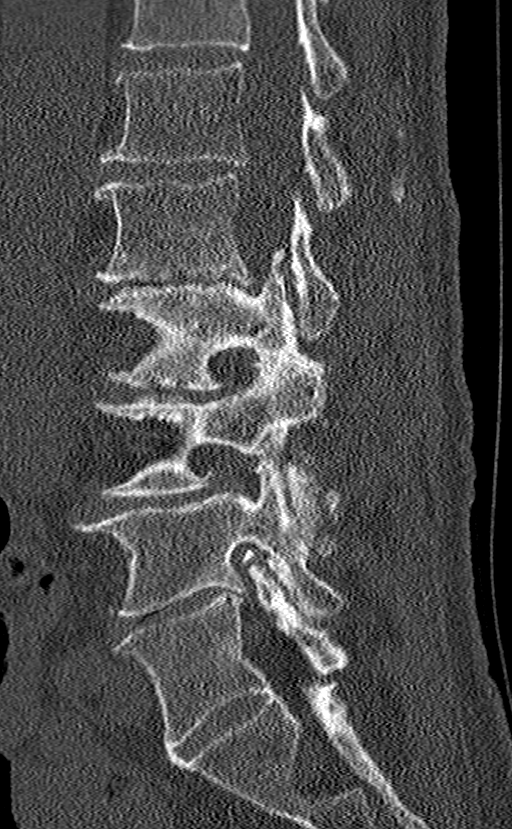
[im 47/71  bone]
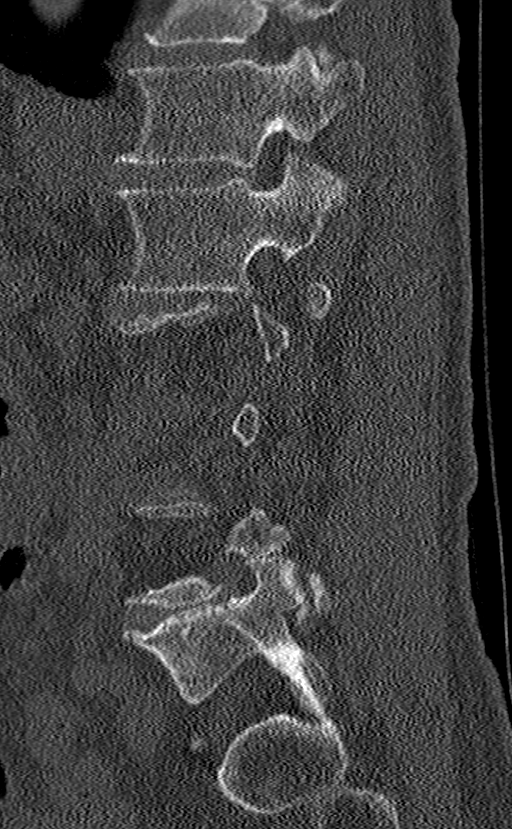

[Series 4: lspine · axial · 0.26mm/px · z∈[+1219,+1351]mm · 3 of 108 slices shown, 4 images]
[im 25/108  soft-tissue]
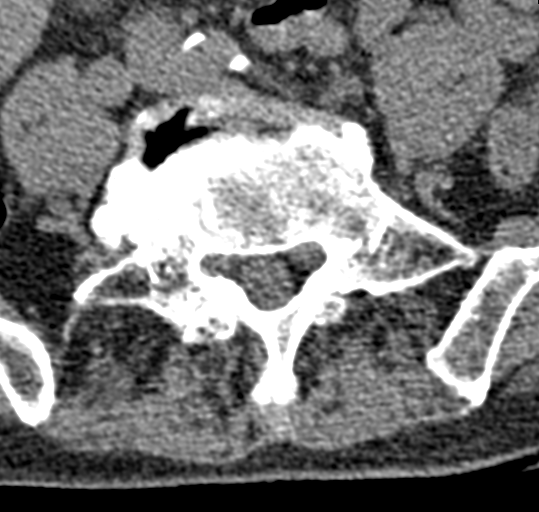
[im 25/108  bone]
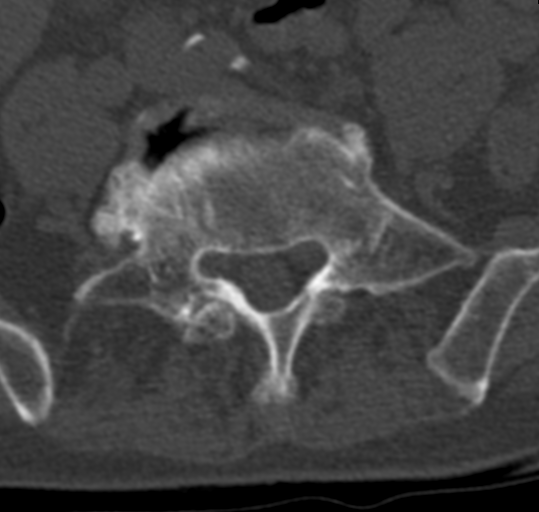
[im 58/108  bone]
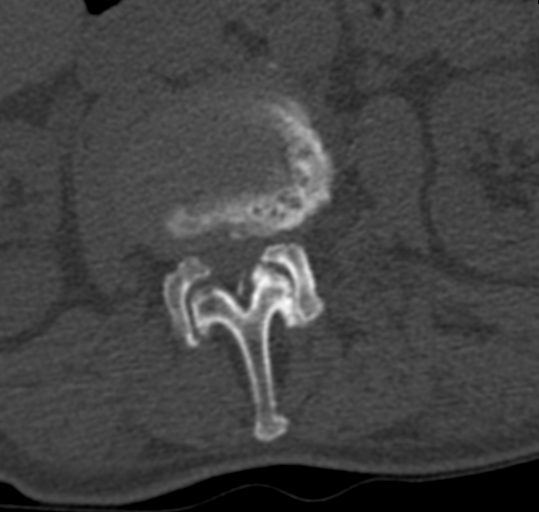
[im 91/108  bone]
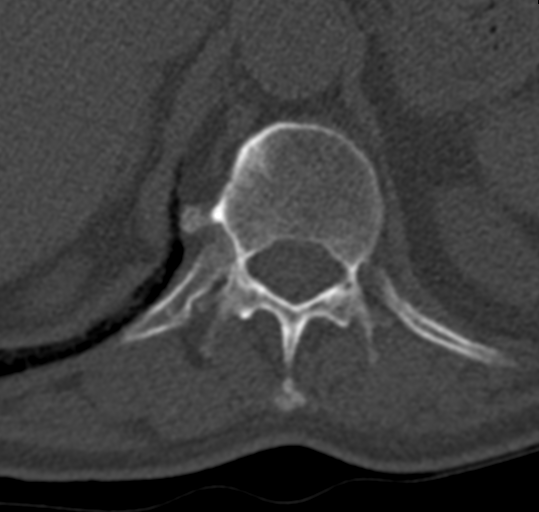

[11 of 33 positions shown; findings below may reference images not displayed]

FINDINGS: Segmentation: L5 is sacralized.

Alignment: Thoracolumbar curvature convex to the left and lower
lumbar curvature convex to the right.

Vertebrae: No fracture or primary bone lesion. Discogenic sclerotic
changes at the L1-2 level.

Paraspinal and other soft tissues: See results of abdominal CT.

Disc levels: T11-12: Mild disc bulge and facet osteoarthritis. No
compressive stenosis.

T12-L1: Disc bulge and facet osteoarthritis. Mild right foraminal
narrowing, not likely significant.

L1-2: Chronic disc degeneration with loss of disc height, endplate
osteophytes and sclerotic change of the endplates. Facet
degeneration and hypertrophy. Stenosis of both lateral recesses and
foramina, right more than left. Neural compression could occur at
this level, particularly on the right.

L2-3: Endplate osteophytes and bulging of the disc. Facet and
ligamentous hypertrophy. Mild stenosis of both lateral recesses and
neural foramina that could possibly be significant.

L3-4: Circumferential bulging of the disc. Facet and ligamentous
hypertrophy. Severe multifactorial stenosis likely to cause neural
compression.

L4-5: Endplate osteophytes and bulging of the disc. Facet
degeneration and hypertrophy. Stenosis of both lateral recesses and
foramina. Neural compression could occur on either side.

L5-S1: Transitional level. Sufficient patency of the canal foramina.
IMPRESSION: L5 is sacralized. Correlation with this numbering scheme would be
important should intervention be contemplated.

L3-4: Severe multifactorial spinal stenosis that would likely cause
neural compression.

L4-5: Stenosis of both lateral recesses and neural foramina that
would likely cause neural compression, particularly with respect to
the exiting L4 nerves.

L2-3: Mild stenosis of the lateral recesses and neural foramina.

L1-2: Lateral recess and foraminal stenosis right more than left.
Neural compression could possibly occur, particularly on the right.

## 2021-03-05 IMAGING — CR DG CHEST 2V
3 series · 3 of 3 positions shown · non-contrast
Comparison: [DATE]

CLINICAL DATA: Intermittent chest pain since yesterday.

EXAM:
CHEST - 2 VIEW

[w chest lat]
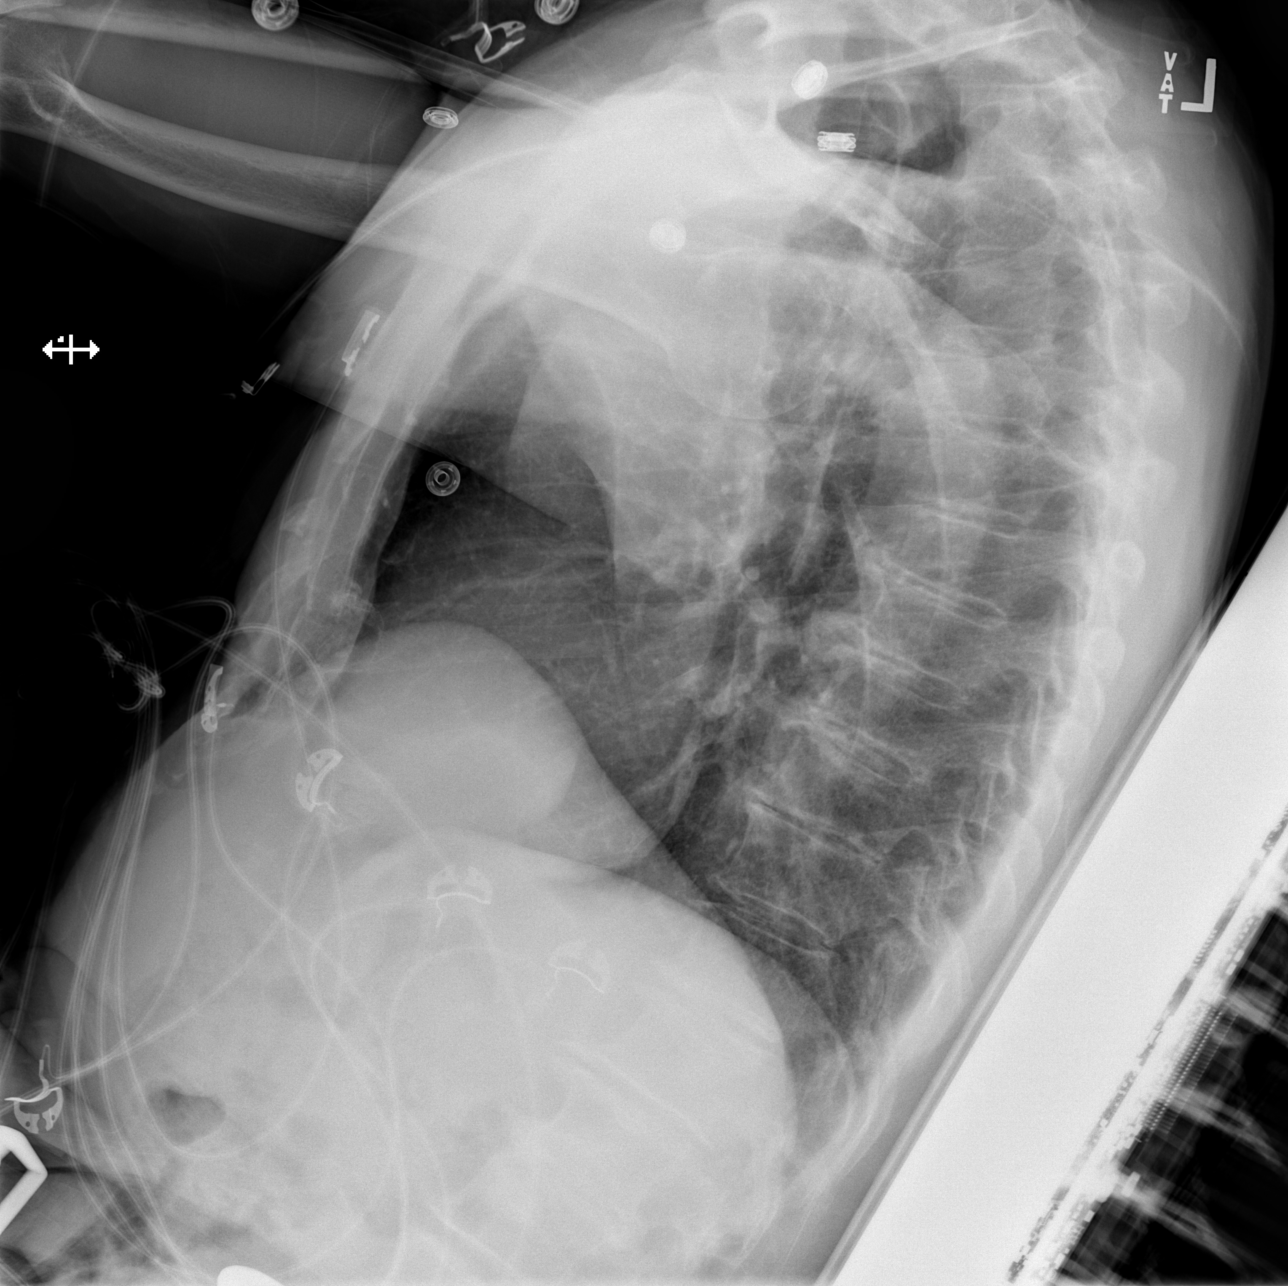

[x chest ap (1 of 2)]
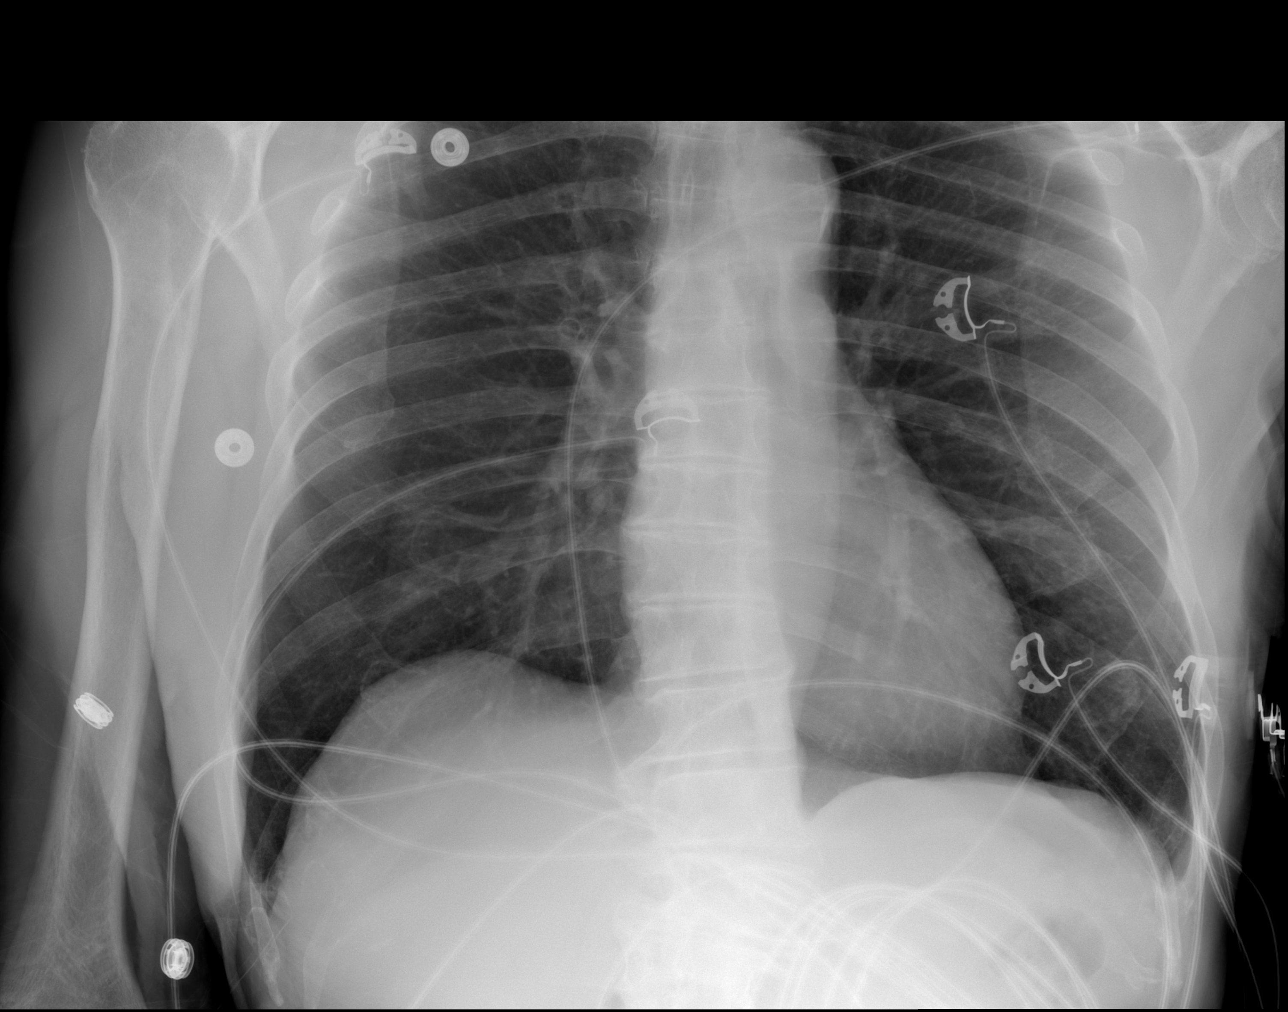

[x chest ap (2 of 2)]
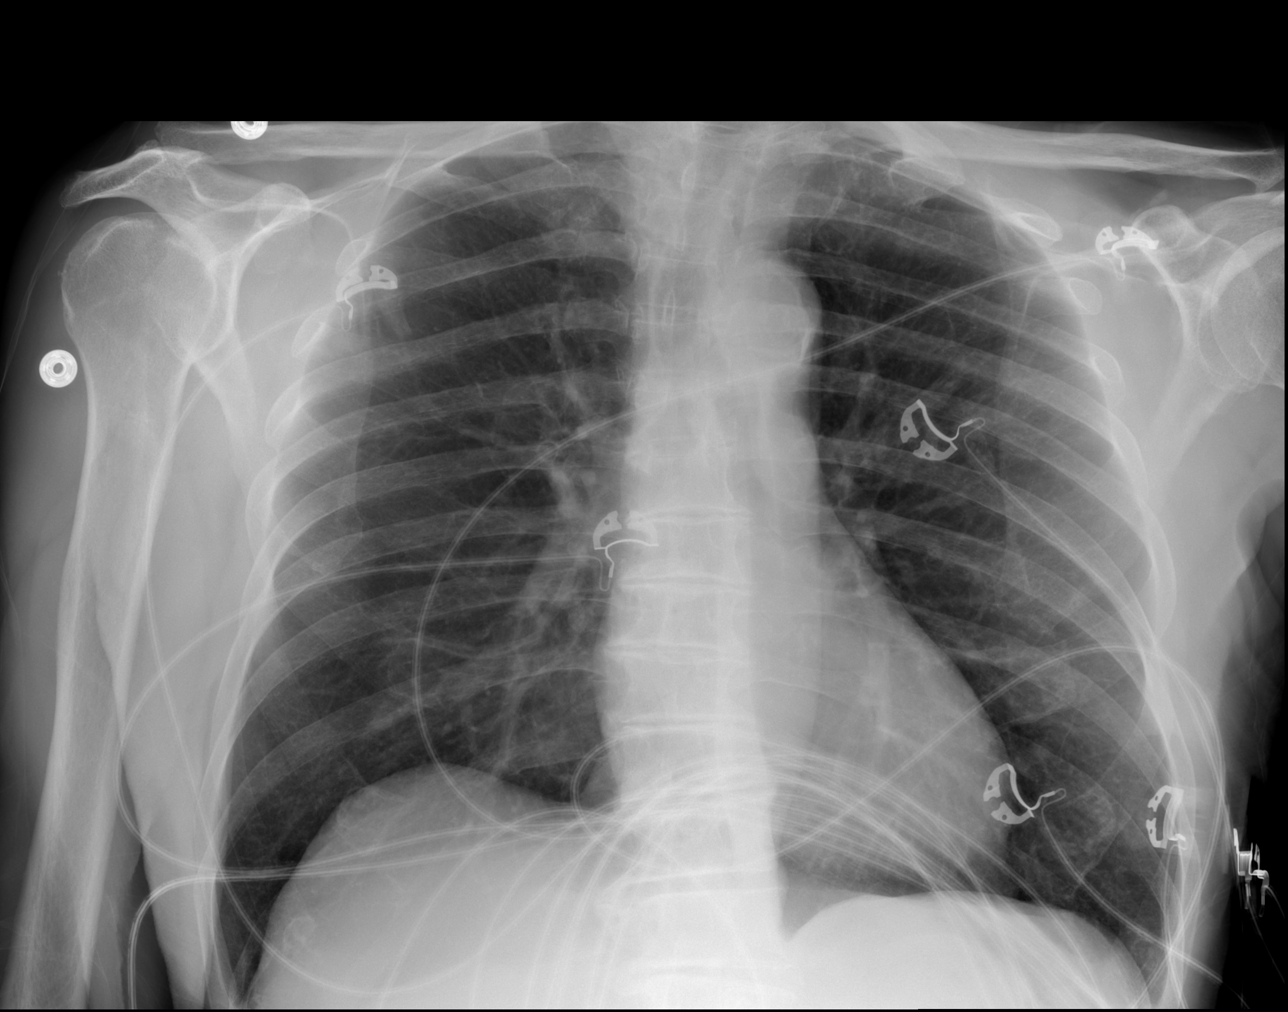

[3 of 3 positions shown; findings below may reference images not displayed]

FINDINGS: Heart size is normal. Minor aortic atherosclerotic calcification is
noted. The pulmonary vascularity is normal. The lungs are clear. No
effusions. No significant bone finding
IMPRESSION: No active disease.  Minor aortic atherosclerotic calcification.

## 2021-03-05 IMAGING — CT CT ABD-PELV W/O CM
2 of 4 series · 13 of 36 positions shown, 16 images · non-contrast
Comparison: [DATE] abdominopelvic CT, report only. Chest
radiograph of earlier today.

CLINICAL DATA: Chest pain since yesterday.  Weight loss.  Weakness.

EXAM:
CT CHEST, ABDOMEN AND PELVIS WITHOUT CONTRAST
TECHNIQUE: Multidetector CT imaging of the chest, abdomen and pelvis was
performed following the standard protocol without IV contrast.

[Series 3: cap w/o · axial · non-contrast · 0.71mm/px · z∈[+1086,+1581]mm · 10 of 123 slices shown, 13 images]
[im 12/123  mediastinal]
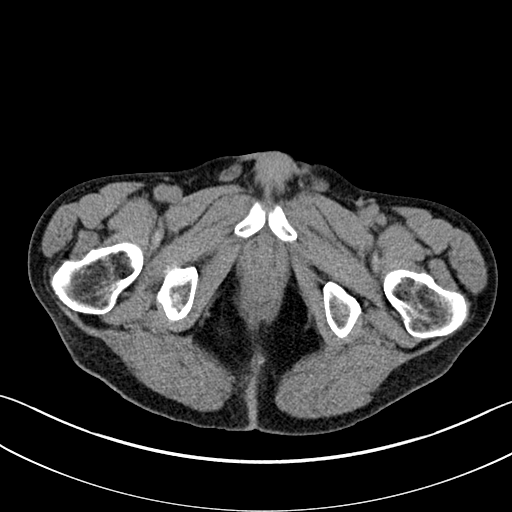
[im 12/123  lung]
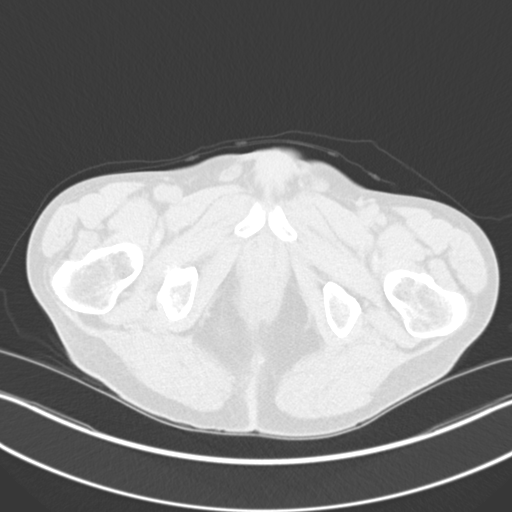
[im 23/123  lung]
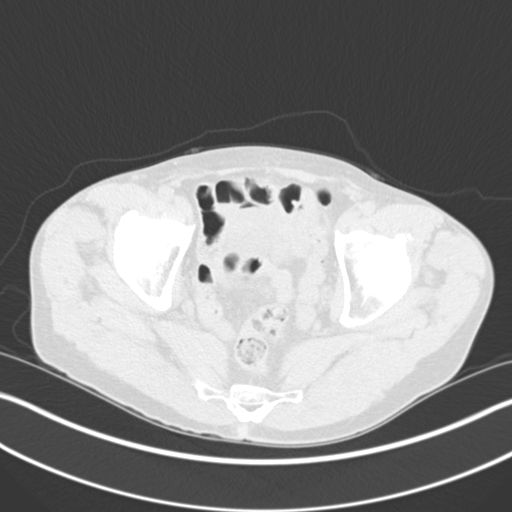
[im 34/123  lung]
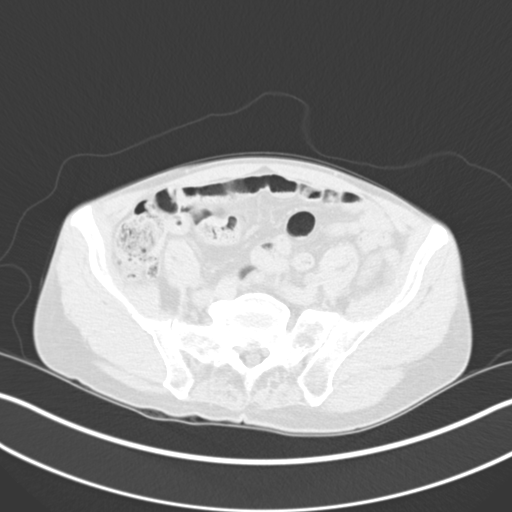
[im 45/123  lung]
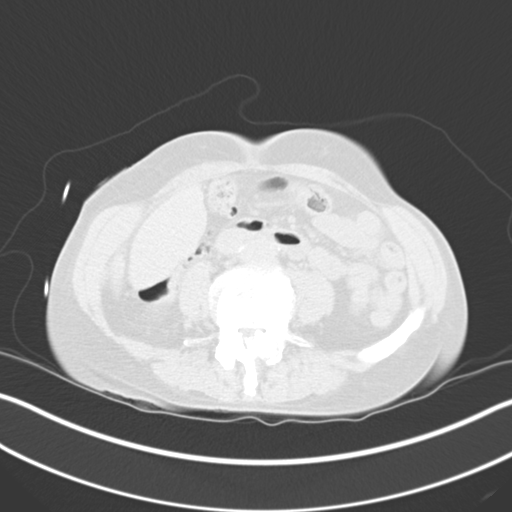
[im 56/123  mediastinal]
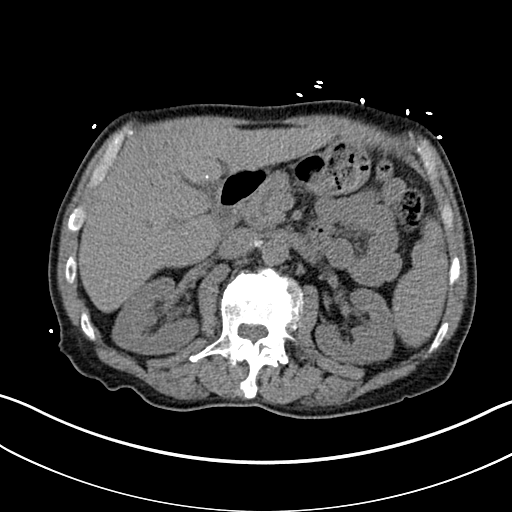
[im 56/123  lung]
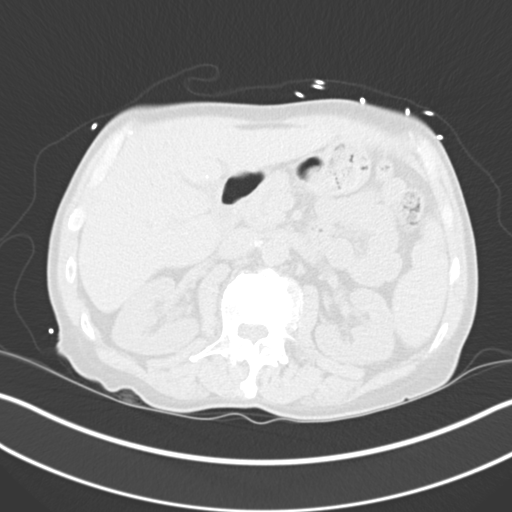
[im 67/123  lung]
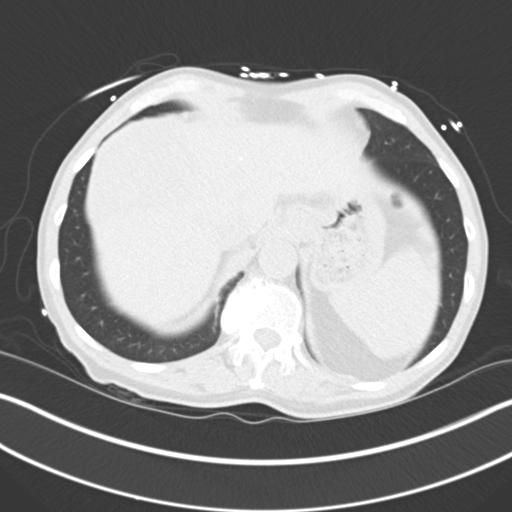
[im 78/123  lung]
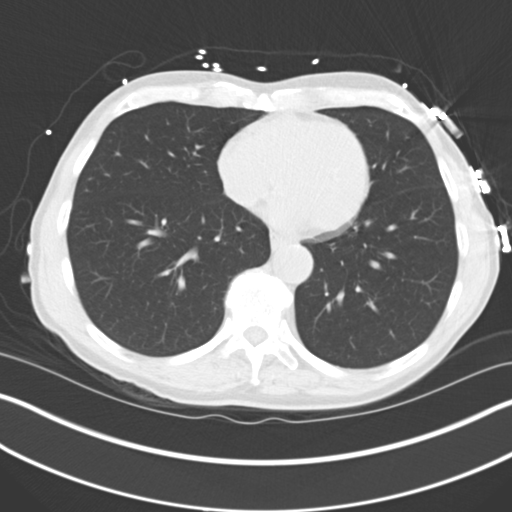
[im 89/123  lung]
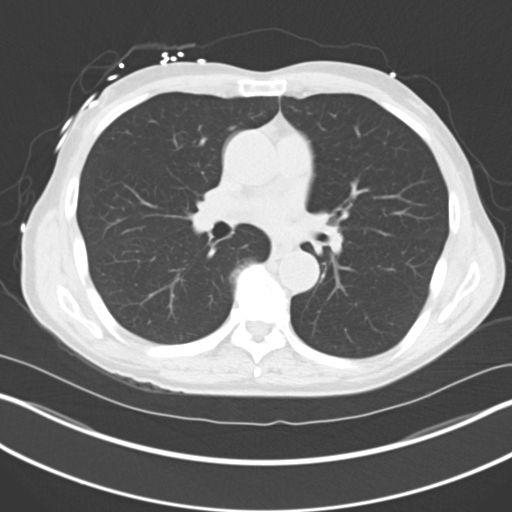
[im 100/123  mediastinal]
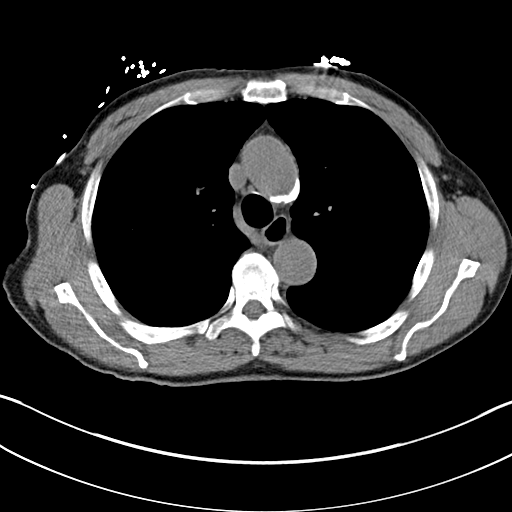
[im 100/123  lung]
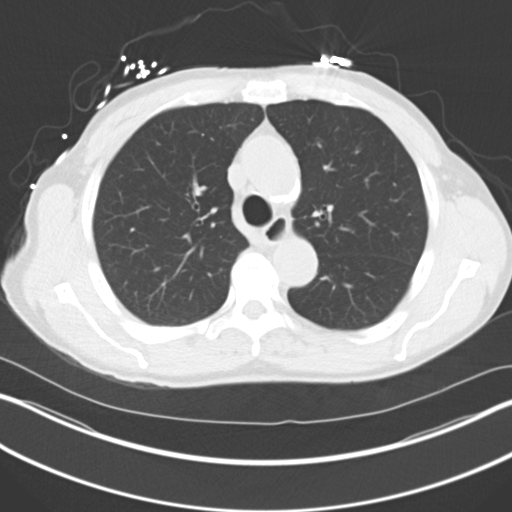
[im 111/123  lung]
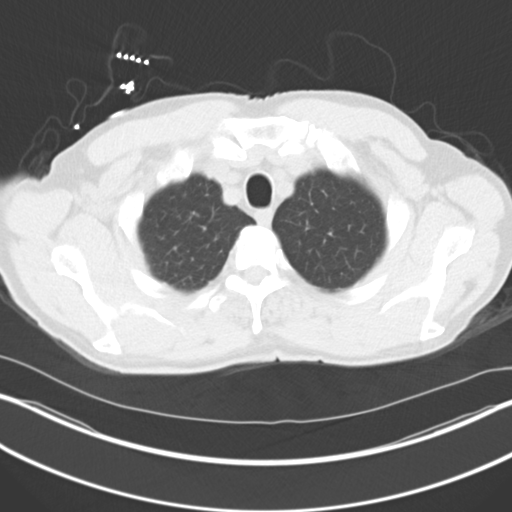

[Series 6: coronals · coronal · 0.78mm/px · 3 of 138 slices shown]
[im 28/138  lung]
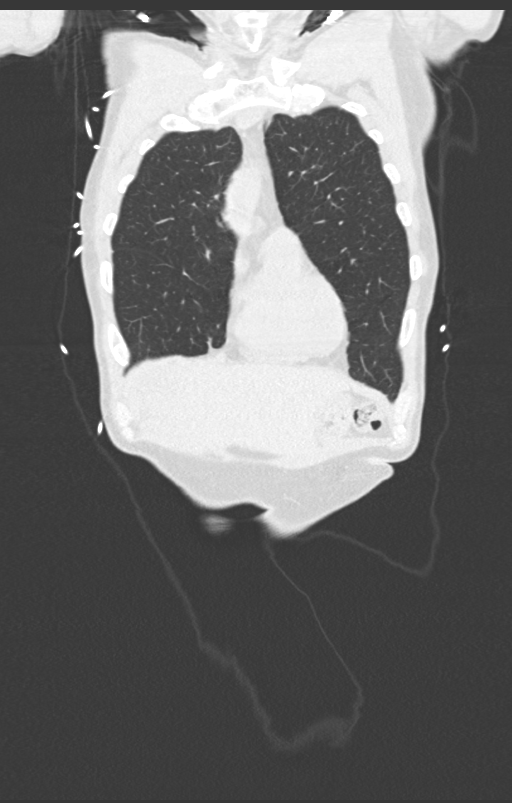
[im 55/138  lung]
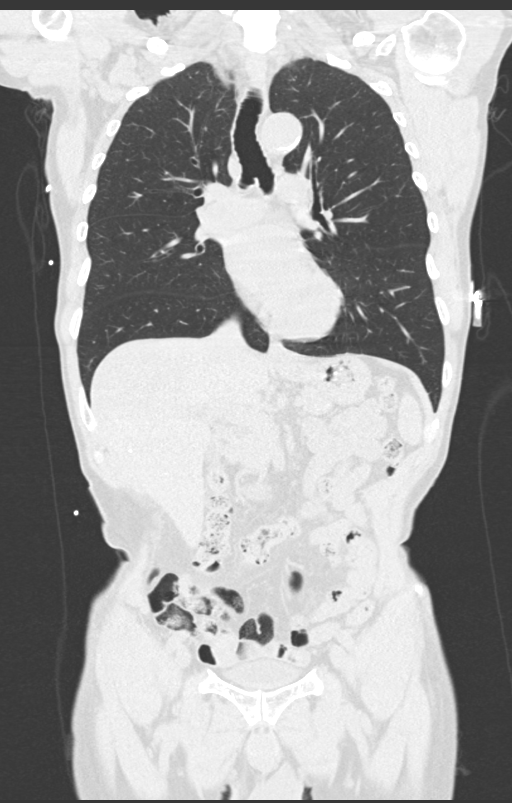
[im 83/138  lung]
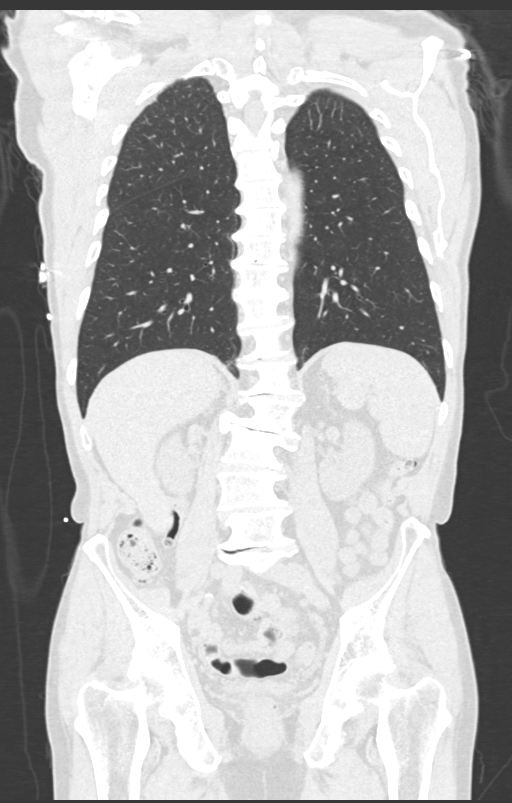

[13 of 36 positions shown; findings below may reference images not displayed]

FINDINGS: CT CHEST FINDINGS

Cardiovascular: Aortic atherosclerosis. Tortuous thoracic aorta.
Normal heart size, without pericardial effusion. Left main coronary
artery calcification. Distal right or left circumflex coronary
calcification as well.

Mediastinum/Nodes: No supraclavicular adenopathy. No mediastinal or
definite hilar adenopathy, given limitations of unenhanced CT.

Esophageal fluid level on [DATE].

Lungs/Pleura: No pleural fluid. Right lower lobe calcified granuloma
on 119/5.

Probable subpleural lymph node in the superior segment right lower
lobe at 6 mm on 65/5.

Left upper lobe calcified granuloma on 36/5.

Musculoskeletal: Lower thoracic and lower cervical spondylosis.

CT ABDOMEN PELVIS FINDINGS

Hepatobiliary: Dominant hepatic dome area of hypoattenuation
measures 6.1 x 7.6 cm on 58/3.

A separate posterior right hepatic lobe hypoattenuating lesion of
1.7 cm on 62/3.

Normal gallbladder, without biliary ductal dilatation.

Pancreas: Normal, without mass or ductal dilatation.

Spleen: Normal in size, without focal abnormality.

Adrenals/Urinary Tract: Normal adrenal glands. Left renal collecting
system calculi of maximally 6 mm in the upper pole. No right-sided
renal calculi. No hydronephrosis. No bladder calculi.

Stomach/Bowel: Normal stomach, without wall thickening. Normal
colon, appendix, and terminal ileum. Normal small bowel.

Vascular/Lymphatic: Aortic atherosclerosis. No abdominopelvic
adenopathy.

Reproductive: Trace bilateral hydroceles are likely physiologic.
Normal prostate.

Other: No significant free fluid.

Musculoskeletal: S-shaped thoracolumbar spine curvature.
Thoracolumbar spondylosis.
IMPRESSION: 1. Right hepatic lobe masses which are suspicious for either
metastatic disease or primary malignancy such as hepatocellular
carcinoma. These are suboptimally evaluated on this noncontrast
exam. Consider multidisciplinary oncology consultation with
potential clinical strategies of tissue sampling versus PET to
direct sampling.
2. No primary malignancy identified within the chest, abdomen, or
pelvis, given limitation of noncontrast exam.
3. Left nephrolithiasis.
4. Coronary artery atherosclerosis. Aortic Atherosclerosis
([TN]-[TN]).
5. Esophageal air fluid level suggests dysmotility or
gastroesophageal reflux.

## 2021-03-05 MED ORDER — TRAZODONE HCL 50 MG PO TABS
50.0000 mg | ORAL_TABLET | Freq: Once | ORAL | Status: AC
  Administered 2021-03-05: 50 mg via ORAL
  Filled 2021-03-05: qty 1

## 2021-03-05 MED ORDER — ENOXAPARIN SODIUM 40 MG/0.4ML IJ SOSY
40.0000 mg | PREFILLED_SYRINGE | INTRAMUSCULAR | Status: DC
  Administered 2021-03-05 – 2021-03-06 (×2): 40 mg via SUBCUTANEOUS
  Filled 2021-03-05 (×2): qty 0.4

## 2021-03-05 MED ORDER — ONDANSETRON HCL 4 MG PO TABS: 4.0000 mg | ORAL_TABLET | Freq: Four times a day (QID) | ORAL | Status: DC | PRN

## 2021-03-05 MED ORDER — ACETAMINOPHEN 325 MG PO TABS: 650.0000 mg | ORAL_TABLET | Freq: Four times a day (QID) | ORAL | Status: DC | PRN

## 2021-03-05 MED ORDER — HYDROCODONE-ACETAMINOPHEN 5-325 MG PO TABS
1.0000 | ORAL_TABLET | ORAL | Status: DC | PRN
Start: 2021-03-05 — End: 2021-03-08

## 2021-03-05 MED ORDER — VITAMIN B-12 100 MCG PO TABS
100.0000 ug | ORAL_TABLET | Freq: Every day | ORAL | Status: DC
  Administered 2021-03-06 – 2021-03-08 (×3): 100 ug via ORAL
  Filled 2021-03-05 (×3): qty 1

## 2021-03-05 MED ORDER — ENSURE ENLIVE PO LIQD
237.0000 mL | Freq: Two times a day (BID) | ORAL | Status: DC
  Administered 2021-03-06 – 2021-03-07 (×4): 237 mL via ORAL

## 2021-03-05 MED ORDER — PNEUMOCOCCAL VAC POLYVALENT 25 MCG/0.5ML IJ INJ
0.5000 mL | INJECTION | INTRAMUSCULAR | Status: DC
  Filled 2021-03-05: qty 0.5

## 2021-03-05 MED ORDER — POLYETHYLENE GLYCOL 3350 17 G PO PACK: 17.0000 g | PACK | Freq: Every day | ORAL | Status: DC | PRN

## 2021-03-05 MED ORDER — MORPHINE SULFATE (PF) 4 MG/ML IV SOLN
4.0000 mg | INTRAVENOUS | Status: DC | PRN
Start: 2021-03-05 — End: 2021-03-08

## 2021-03-05 MED ORDER — ONDANSETRON HCL 4 MG/2ML IJ SOLN: 4.0000 mg | Freq: Four times a day (QID) | INTRAMUSCULAR | Status: DC | PRN

## 2021-03-05 MED ORDER — MORPHINE SULFATE (PF) 4 MG/ML IV SOLN
4.0000 mg | Freq: Once | INTRAVENOUS | Status: AC
  Administered 2021-03-05: 4 mg via INTRAVENOUS
  Filled 2021-03-05: qty 1

## 2021-03-05 MED ORDER — ASCORBIC ACID 500 MG PO TABS
500.0000 mg | ORAL_TABLET | Freq: Every day | ORAL | Status: DC
  Administered 2021-03-06 – 2021-03-08 (×3): 500 mg via ORAL
  Filled 2021-03-05 (×3): qty 1

## 2021-03-05 MED ORDER — ACETAMINOPHEN 650 MG RE SUPP: 650.0000 mg | Freq: Four times a day (QID) | RECTAL | Status: DC | PRN

## 2021-03-05 MED ORDER — LACTATED RINGERS IV BOLUS (SEPSIS)
1000.0000 mL | Freq: Once | INTRAVENOUS | Status: AC
  Administered 2021-03-05: 1000 mL via INTRAVENOUS

## 2021-03-05 MED ORDER — SENNA 8.6 MG PO TABS
1.0000 | ORAL_TABLET | Freq: Two times a day (BID) | ORAL | Status: DC
  Administered 2021-03-05 – 2021-03-08 (×4): 8.6 mg via ORAL
  Filled 2021-03-05 (×5): qty 1

## 2021-03-05 MED ORDER — ADULT MULTIVITAMIN W/MINERALS CH
1.0000 | ORAL_TABLET | Freq: Every day | ORAL | Status: DC
  Administered 2021-03-06 – 2021-03-08 (×3): 1 via ORAL
  Filled 2021-03-05 (×3): qty 1

## 2021-03-05 MED ORDER — ONDANSETRON HCL 4 MG/2ML IJ SOLN
4.0000 mg | Freq: Once | INTRAMUSCULAR | Status: AC
  Administered 2021-03-05: 4 mg via INTRAVENOUS
  Filled 2021-03-05: qty 2

## 2021-03-05 MED ORDER — BISACODYL 5 MG PO TBEC: 5.0000 mg | DELAYED_RELEASE_TABLET | Freq: Every day | ORAL | Status: DC | PRN

## 2021-03-05 MED ORDER — ENSURE ENLIVE PO LIQD: 237.0000 mL | Freq: Two times a day (BID) | ORAL | Status: DC

## 2021-03-05 MED ORDER — HYDRALAZINE HCL 25 MG PO TABS: 25.0000 mg | ORAL_TABLET | Freq: Three times a day (TID) | ORAL | Status: DC | PRN

## 2021-03-05 NOTE — ED Provider Notes (Signed)
[facility] DEPT Provider Note   CSN: [id number] Arrival date & time: 03/05/21  1143     History Chief Complaint  Patient presents with  . Chest Pain  . Weakness  . Weight Loss    Shawn Bailey is a 75 y.o. male who presents with concern for intermittent chest pain on the right chest that radiates into the right shoulder x2 weeks.  Patient states that it is significantly exacerbated with deep inhalation, he endorses mild swelling over the right chest wall, and sharp stabbing pain when he lies on that side.  Additionally endorses generalized abdominal pain and diarrhea x2 weeks, denies fevers or chills.  Of note, patient endorses a 50 pound weight loss unintentionally over the last 4 to 6 weeks.  Also endorses palpitations feeling to get his heart is racing. The patient's ex-wife is at the bedside, who regularly checks in on him.  She states that he has been acting confused, "like a different person", listless. Patient also endorses new itchy "mole" underneath of his right eye, as well as "seeing yellow in spots" whenever he changes positions.  Patient is tearful, fearful.  Endorses whole body generalized weakness despite increased hydration and food intake at home.  Endorses nausea but denies any vomiting, denies any hematuria, dysuria, urinary frequency or urgency, denies melena or hematochezia.  I have personally reviewed this patient's medical records.  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  Endorses intermittent social alcohol use, denies smoking in the past.  HPI     Past Medical History:  Diagnosis Date  . Depression   . Parasite infestation     Patient Active Problem List   Diagnosis Date Noted  . RUQ abdominal pain 03/05/2021    Past Surgical History:  Procedure Laterality Date  . NO PAST SURGERIES         History reviewed. No pertinent family  history.  Social History   Tobacco Use  . Smoking status: Never Smoker  . Smokeless tobacco: Never Used  Vaping Use  . Vaping Use: Never used  Substance Use Topics  . Alcohol use: Yes    Comment: occasional beer  . Drug use: Not Currently    Home Medications Prior to Admission medications   Medication Sig Start Date End Date Taking? Authorizing Provider  aspirin EC 325 MG tablet Take 325 mg by mouth daily.    [provider]  HYDROcodone-acetaminophen (NORCO/VICODIN) 5-325 MG tablet Take 1 tablet by mouth every 4 (four) hours as needed. 02/08/16   Nona Dell, PA-C  ibuprofen (ADVIL,MOTRIN) 600 MG tablet Take 1 tablet (600 mg total) by mouth every 6 (six) hours as needed. 02/08/16   Nona Dell, PA-C  Multiple Vitamin (MULTIVITAMIN WITH MINERALS) TABS tablet Take 1 tablet by mouth daily.    [provider]  POTASSIUM GLUCONATE PO Take 1 tablet by mouth daily.    [provider]    Allergies    Patient has no known allergies.  Review of Systems   Review of Systems  Constitutional: Positive for activity change, fatigue, fever and unexpected weight change. Negative for appetite change, chills and diaphoresis.       50 Lb weight loss in 1 month, unintentional  HENT: Negative.   Eyes: Positive for visual disturbance. Negative for photophobia.  Respiratory: Positive for cough and shortness of breath. Negative for chest tightness and wheezing.   Cardiovascular: Positive  for chest pain and palpitations. Negative for leg swelling.  Gastrointestinal: Positive for abdominal pain, diarrhea and nausea. Negative for anal bleeding, blood in stool, constipation, rectal pain and vomiting.  Genitourinary: Negative.   Musculoskeletal: Positive for back pain and myalgias.  Skin: Positive for rash.  Neurological: Positive for weakness and light-headedness. Negative for dizziness, facial asymmetry and headaches.  Psychiatric/Behavioral: Positive  for confusion.    Physical Exam Updated Vital Signs BP (!) 162/98   Pulse 82   Temp 99 F (37.2 C) (Oral)   Resp 17   Ht 5\' 5"  (1.651 m)   Wt 57.1 kg   SpO2 97%   BMI 20.95 kg/m   Physical Exam Vitals and nursing note reviewed.  Constitutional:      Appearance: He is normal weight. He is ill-appearing. He is not toxic-appearing.  HENT:     Head: Normocephalic and atraumatic.      Nose: Nose normal.     Mouth/Throat:     Mouth: Mucous membranes are moist.     Pharynx: Oropharynx is clear. Uvula midline. No oropharyngeal exudate, posterior oropharyngeal erythema or uvula swelling.     Tonsils: No tonsillar exudate.  Eyes:     General: Lids are normal. Vision grossly intact.        Right eye: No discharge.        Left eye: No discharge.     Extraocular Movements: Extraocular movements intact.     Conjunctiva/sclera: Conjunctivae normal.     Pupils: Pupils are equal, round, and reactive to light.  Neck:     Trachea: Trachea and phonation normal.  Cardiovascular:     Rate and Rhythm: Normal rate and regular rhythm.     Pulses: Normal pulses.     Heart sounds: Normal heart sounds. No murmur heard.   Pulmonary:     Effort: Pulmonary effort is normal. No tachypnea, bradypnea, accessory muscle usage, prolonged expiration or respiratory distress.     Breath sounds: Normal breath sounds. No wheezing or rales.     Comments: Pain in right chest with deep inspiration Chest:     Chest wall: Swelling and tenderness present. No crepitus or edema.    Abdominal:     General: Bowel sounds are normal. There is no distension.     Palpations: Abdomen is soft.     Tenderness: There is abdominal tenderness in the right upper quadrant, epigastric area and periumbilical area. There is no right CVA tenderness, left CVA tenderness, guarding or rebound.  Musculoskeletal:        General: No deformity.     Cervical back: Normal range of motion and neck supple. No edema, rigidity or crepitus.  No pain with movement, spinous process tenderness or muscular tenderness.     Right lower leg: No edema.     Left lower leg: No edema.  Lymphadenopathy:     Cervical: No cervical adenopathy.  Skin:    General: Skin is warm and dry.     Capillary Refill: Capillary refill takes less than 2 seconds.  Neurological:     General: No focal deficit present.     Mental Status: He is alert and oriented to person, place, and time. Mental status is at baseline.     Sensory: Sensation is intact.     Motor: Motor function is intact.  Psychiatric:        Mood and Affect: Mood normal. Affect is tearful.     ED Results / Procedures / Treatments  Labs (all labs ordered are listed, but only abnormal results are displayed) Labs Reviewed  BASIC METABOLIC PANEL - Abnormal; Notable for the following components:      Result Value   Sodium 133 (*)    All other components within normal limits  CBC - Abnormal; Notable for the following components:   Platelets 130 (*)    All other components within normal limits  HEPATIC FUNCTION PANEL - Abnormal; Notable for the following components:   Total Protein 8.2 (*)    AST 112 (*)    ALT 90 (*)    All other components within normal limits  URINE CULTURE  RESP PANEL BY RT-PCR (FLU A&B, COVID) ARPGX2  LACTIC ACID, PLASMA  PROTIME-INR  APTT  URINALYSIS, ROUTINE W REFLEX MICROSCOPIC  MAGNESIUM  TSH  LIPASE, BLOOD  HEMOGLOBIN A1C  CBC  CREATININE, SERUM  CBG MONITORING, ED  TROPONIN I (HIGH SENSITIVITY)  TROPONIN I (HIGH SENSITIVITY)    EKG None  Radiology CT Abdomen Pelvis Wo Contrast  Result Date: 03/05/2021 CLINICAL DATA:  Chest pain since yesterday.  Weight loss.  Weakness. EXAM: CT CHEST, ABDOMEN AND PELVIS WITHOUT CONTRAST TECHNIQUE: Multidetector CT imaging of the chest, abdomen and pelvis was performed following the standard protocol without IV contrast. COMPARISON:  05/05/2007 abdominopelvic CT, report only. Chest radiograph of earlier  today. FINDINGS: CT CHEST FINDINGS Cardiovascular: Aortic atherosclerosis. Tortuous thoracic aorta. Normal heart size, without pericardial effusion. Left main coronary artery calcification. Distal right or left circumflex coronary calcification as well. Mediastinum/Nodes: No supraclavicular adenopathy. No mediastinal or definite hilar adenopathy, given limitations of unenhanced CT. Esophageal fluid level on 30/3. Lungs/Pleura: No pleural fluid. Right lower lobe calcified granuloma on 119/5. Probable subpleural lymph node in the superior segment right lower lobe at 6 mm on 65/5. Left upper lobe calcified granuloma on 36/5. Musculoskeletal: Lower thoracic and lower cervical spondylosis. CT ABDOMEN PELVIS FINDINGS Hepatobiliary: Dominant hepatic dome area of hypoattenuation measures 6.1 x 7.6 cm on 58/3. A separate posterior right hepatic lobe hypoattenuating lesion of 1.7 cm on 62/3. Normal gallbladder, without biliary ductal dilatation. Pancreas: Normal, without mass or ductal dilatation. Spleen: Normal in size, without focal abnormality. Adrenals/Urinary Tract: Normal adrenal glands. Left renal collecting system calculi of maximally 6 mm in the upper pole. No right-sided renal calculi. No hydronephrosis. No bladder calculi. Stomach/Bowel: Normal stomach, without wall thickening. Normal colon, appendix, and terminal ileum. Normal small bowel. Vascular/Lymphatic: Aortic atherosclerosis. No abdominopelvic adenopathy. Reproductive: Trace bilateral hydroceles are likely physiologic. Normal prostate. Other: No significant free fluid. Musculoskeletal: S-shaped thoracolumbar spine curvature. Thoracolumbar spondylosis. IMPRESSION: 1. Right hepatic lobe masses which are suspicious for either metastatic disease or primary malignancy such as hepatocellular carcinoma. These are suboptimally evaluated on this noncontrast exam. Consider multidisciplinary oncology consultation with potential clinical strategies of tissue sampling  versus PET to direct sampling. 2. No primary malignancy identified within the chest, abdomen, or pelvis, given limitation of noncontrast exam. 3. Left nephrolithiasis. 4. Coronary artery atherosclerosis. Aortic Atherosclerosis (ICD10-I70.0). 5. Esophageal air fluid level suggests dysmotility or gastroesophageal reflux. Electronically Signed   By: Abigail Miyamoto M.D.   On: 03/05/2021 14:07   DG Chest 2 View  Result Date: 03/05/2021 CLINICAL DATA:  Intermittent chest pain since yesterday. EXAM: CHEST - 2 VIEW COMPARISON:  02/08/2016 FINDINGS: Heart size is normal. Minor aortic atherosclerotic calcification is noted. The pulmonary vascularity is normal. The lungs are clear. No effusions. No significant bone finding IMPRESSION: No active disease.  Minor aortic atherosclerotic calcification. Electronically Signed  By: Nelson Chimes M.D.   On: 03/05/2021 12:53   CT Chest Wo Contrast  Result Date: 03/05/2021 CLINICAL DATA:  Chest pain since yesterday.  Weight loss.  Weakness. EXAM: CT CHEST, ABDOMEN AND PELVIS WITHOUT CONTRAST TECHNIQUE: Multidetector CT imaging of the chest, abdomen and pelvis was performed following the standard protocol without IV contrast. COMPARISON:  05/05/2007 abdominopelvic CT, report only. Chest radiograph of earlier today. FINDINGS: CT CHEST FINDINGS Cardiovascular: Aortic atherosclerosis. Tortuous thoracic aorta. Normal heart size, without pericardial effusion. Left main coronary artery calcification. Distal right or left circumflex coronary calcification as well. Mediastinum/Nodes: No supraclavicular adenopathy. No mediastinal or definite hilar adenopathy, given limitations of unenhanced CT. Esophageal fluid level on 30/3. Lungs/Pleura: No pleural fluid. Right lower lobe calcified granuloma on 119/5. Probable subpleural lymph node in the superior segment right lower lobe at 6 mm on 65/5. Left upper lobe calcified granuloma on 36/5. Musculoskeletal: Lower thoracic and lower cervical  spondylosis. CT ABDOMEN PELVIS FINDINGS Hepatobiliary: Dominant hepatic dome area of hypoattenuation measures 6.1 x 7.6 cm on 58/3. A separate posterior right hepatic lobe hypoattenuating lesion of 1.7 cm on 62/3. Normal gallbladder, without biliary ductal dilatation. Pancreas: Normal, without mass or ductal dilatation. Spleen: Normal in size, without focal abnormality. Adrenals/Urinary Tract: Normal adrenal glands. Left renal collecting system calculi of maximally 6 mm in the upper pole. No right-sided renal calculi. No hydronephrosis. No bladder calculi. Stomach/Bowel: Normal stomach, without wall thickening. Normal colon, appendix, and terminal ileum. Normal small bowel. Vascular/Lymphatic: Aortic atherosclerosis. No abdominopelvic adenopathy. Reproductive: Trace bilateral hydroceles are likely physiologic. Normal prostate. Other: No significant free fluid. Musculoskeletal: S-shaped thoracolumbar spine curvature. Thoracolumbar spondylosis. IMPRESSION: 1. Right hepatic lobe masses which are suspicious for either metastatic disease or primary malignancy such as hepatocellular carcinoma. These are suboptimally evaluated on this noncontrast exam. Consider multidisciplinary oncology consultation with potential clinical strategies of tissue sampling versus PET to direct sampling. 2. No primary malignancy identified within the chest, abdomen, or pelvis, given limitation of noncontrast exam. 3. Left nephrolithiasis. 4. Coronary artery atherosclerosis. Aortic Atherosclerosis (ICD10-I70.0). 5. Esophageal air fluid level suggests dysmotility or gastroesophageal reflux. Electronically Signed   By: Abigail Miyamoto M.D.   On: 03/05/2021 14:07   CT L-SPINE NO CHARGE  Result Date: 03/05/2021 CLINICAL DATA:  Weight loss over the last month. Chest and abdominal pain. EXAM: CT LUMBAR SPINE WITHOUT CONTRAST TECHNIQUE: Multidetector CT imaging of the lumbar spine was performed without intravenous contrast administration. Multiplanar  CT image reconstructions were also generated. 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 Alignment: Thoracolumbar curvature convex to the left and lower lumbar curvature convex to the right. Vertebrae: No fracture or primary bone lesion. Discogenic sclerotic changes at the L1-2 level. Paraspinal and other soft tissues: See results of abdominal CT. Disc levels: T11-12: Mild disc bulge and facet osteoarthritis. No compressive stenosis. T12-L1: Disc bulge and facet osteoarthritis. Mild right foraminal narrowing, not likely significant. L1-2: Chronic disc degeneration with loss of disc height, endplate osteophytes and sclerotic change of the endplates. Facet degeneration and hypertrophy. Stenosis of both lateral recesses and foramina, right more than left. Neural compression could occur at this level, particularly on the right. L2-3: Endplate osteophytes and bulging of the disc. Facet and ligamentous hypertrophy. Mild stenosis of both lateral recesses and neural foramina that could possibly be significant. L3-4: Circumferential bulging of the disc. Facet and ligamentous hypertrophy. Severe multifactorial stenosis likely to cause neural compression. L4-5: Endplate osteophytes and bulging of the disc. Facet degeneration and hypertrophy.  Stenosis of both lateral recesses and foramina. Neural compression could occur on either side. L5-S1: Transitional level. Sufficient patency of the canal foramina. 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 Correlation with this numbering scheme would be important should intervention be contemplated. 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 L4-5: Stenosis of both lateral recesses and neural foramina that would likely cause neural compression, particularly with respect to the exiting L4 nerves. L2-3: Mild stenosis of the lateral recesses and neural foramina. L1-2: Lateral recess and foraminal stenosis right more than left.  Neural compression could possibly occur, particularly on the right. Electronically Signed   By: Nelson Chimes M.D.   On: 03/05/2021 13:41    Procedures Procedures   Medications Ordered in ED Medications  enoxaparin (LOVENOX) injection 40 mg (has no administration in time range)  acetaminophen (TYLENOL) tablet 650 mg (has no administration in time range)    Or  acetaminophen (TYLENOL) suppository 650 mg (has no administration in time range)  HYDROcodone-acetaminophen (NORCO/VICODIN) 5-325 MG per tablet 1-2 tablet (has no administration in time range)  senna (SENOKOT) tablet 8.6 mg (has no administration in time range)  polyethylene glycol (MIRALAX / GLYCOLAX) packet 17 g (has no administration in time range)  bisacodyl (DULCOLAX) EC tablet 5 mg (has no administration in time range)  ondansetron (ZOFRAN) tablet 4 mg (has no administration in time range)    Or  ondansetron (ZOFRAN) injection 4 mg (has no administration in time range)  hydrALAZINE (APRESOLINE) tablet 25 mg (has no administration in time range)  pneumococcal 23 valent vaccine (PNEUMOVAX-23) injection 0.5 mL (has no administration in time range)  lactated ringers bolus 1,000 mL (0 mLs Intravenous Stopped 03/05/21 1431)  morphine 4 MG/ML injection 4 mg (4 mg Intravenous Given 03/05/21 1255)  ondansetron (ZOFRAN) injection 4 mg (4 mg Intravenous Given 03/05/21 1255)    ED Course  I have reviewed the triage vital signs and the nursing notes.  Pertinent labs & imaging results that were available during my care of the patient were reviewed by me and considered in my medical decision making (see chart for details).  Clinical Course as of 03/05/21 1639  Fri Mar 05, 2021  1535 Consult to oncology, who feels outpatient or inpatient follow-up are both reasonable.  As patient has very limited social structure outpatient and no established outpatient medical care, and may be reasonable to admit him for further work-up and connection with  the oncology team.  We will share disposition decision with patient. [RS]  7829 CT findings and likely new malignancy were discussed with the patient at the bedside, who voiced understanding of his medical evaluation.  Shared decision making with the patient regarding disposition plan.  Given he has limited social support and has been feeling quite weak at home and unable to care for himself, he would prefer to be admitted to the hospital for observation and further oncologic evaluation.  I feel this is reasonable.  Consult placed to hospitalist. [RS]  1606 Consult to hospitalist Dr. Arbutus Ped who is agreeable to seeing this patient and admitting him to her service. I appreciate her collaboration in the care of the is patient.  [RS]    Clinical Course User Index [RS] Mailen Newborn, Gypsy Balsam, PA-C   MDM Rules/Calculators/A&P                          75 year old male presents with concern for constellation of symptoms over the last month including 30  pound weight loss unintentionally, generalized weakness, and upper abdominal pain that is worse on the right side.  Differential diagnosis is concerning primarily for malignancy, tuberculosis, thyroid or metabolic derangement.  Mildly hypertensive on intake, vital signs otherwise normal.  Cardiopulmonary exam without significant abnormality.  There is mild swelling tenderness palpation of the right chest wall.  Additionally abdomen is generally tender, most severe in the right upper quadrant and epigastrium.  Patient appears cachectic  CBC and BMP obtained in triage.  CBC with thrombocytopenia 130, BMP with mild hyponatremia 133.  Hepatic function panel with elevated AST/ALT  112/90.  Lipase is normal, troponin is negative, 4, delta troponin negative, 5.  Coags normal, magnesium is normal, lactic acid is normal.  EKG without ischemic changes.  CT chest right lower lobe calcified granuloma and subpleural lymph node in superior segment right lower lobe.  CT  abdomen pelvis concerning for multiple right hepatic lobe masses concerning for metastatic disease versus primary malignancy such as hepatocellular carcinoma. Case discussed with oncology who recommends either follow-up next week with the cancer center outpatient or inpatient admission for further evaluation and work-up.  Shared decision making with the patient with decision to proceed with admission to the hospital given generalized weakness at home, progression of symptoms over last month, and poor social support.  Consult placed to hospitalist.   Patient admitted to hospitalist. Stable for transfer at this time.   This chart was dictated using voice recognition software, Dragon. Despite the best efforts of this provider to proofread and correct errors, errors may still occur which can change documentation meaning.  Final Clinical Impression(s) / ED Diagnoses Final diagnoses:  Pain  Liver mass, right lobe    Rx / DC Orders ED Discharge Orders    None       Aura Dials 03/05/21 1639    Charlesetta Shanks, MD 03/06/21 1944

## 2021-03-05 NOTE — ED Notes (Signed)
Patient back from radiology at this time 

## 2021-03-05 NOTE — ED Notes (Signed)
Sponseller PA-C to bedside at this time

## 2021-03-05 NOTE — ED Triage Notes (Signed)
Coming from home, intermittent chest pain since yesterday, increased pressure since this morning, weakness, has lost 50 pounds in 1 month

## 2021-03-05 NOTE — Progress Notes (Signed)
Pt's ex-wife request that a spanish translator be used when speaking to pt. He understands a lot of english if spoken to slowly but she states there are some words that he does not understand. Shawn Bailey BSN, RN-BC Admissions RN 03/05/2021 4:27 PM

## 2021-03-05 NOTE — ED Notes (Signed)
All of patient's belongings given to ex wife except phone

## 2021-03-05 NOTE — ED Notes (Signed)
Patient transported to X-ray 

## 2021-03-05 NOTE — H&P (Signed)
History and Physical    Shawn Bailey [id number] DOB: 06-30-1946 DOA: 03/05/2021  PCP: Pcp, No  Patient coming from: home, lives alone  I have personally briefly reviewed patient's old medical records in [place]  Chief Complaint: generalized weakness, R chest pain  NOTE -patient is mostly Spanish-speaking, does speak some [place].  If his ex-wife is not at bedside, they request interpreter be used so patient completely understands questions asked of him.  HPI: Shawn Bailey is a 75 y.o. male without any known prior medical history (does not follow with any healthcare providers) who presented to the ED with complaints of right-sided chest pain since yesterday.  In addition, patient reported being severely weak resulting in near falls and lack of ability to perform ADLs.  Patient lives alone.  He also reports 35 pound weight loss in the past month, about 50 pounds in the last 2 months.  Denies fevers or chills, reports nausea without vomiting.  Reports diarrhea that occurs after eating.  Otherwise denies any acute complaints or recent illnesses.   ED Course: Temp 99 F, BP uncontrolled 159/94, later 181/95, HR 97.  Labs notable for elevated transaminases with AST 112, ALT 90, normal total bili.  CT chest abdomen and pelvis showed right hepatic lobe masses which are suspicious for either metastatic disease or primary malignancy such as hepatocellular carcinoma.  No other findings of malignancy were seen elsewhere in the chest abdomen or pelvis, limited by lack of contrast however.  Left nephrolithiasis.  Esophageal air-fluid level suggestive of dysmotility ordered.  Patient was treated with morphine in the ED and reported resolution of his pain.  Admitted for observation and PT OT evaluations given patient lacks necessary social support to safely be discharged home in his weak current state.   Review of Systems: As per HPI otherwise 10 point review of systems negative.     Past Medical History:  Diagnosis Date  . Parasite infestation     No past surgical history on file. -Reports none   has no history on file for tobacco use, alcohol use, and drug use.  No Known Allergies  Family history -mother: Hypertension, heart disease.  2 sons both diabetic.  1 son status post renal transplant   Prior to Admission medications   Medication Sig Start Date End Date Taking? Authorizing Provider  aspirin EC 325 MG tablet Take 325 mg by mouth daily.    [provider]  HYDROcodone-acetaminophen (NORCO/VICODIN) 5-325 MG tablet Take 1 tablet by mouth every 4 (four) hours as needed. 02/08/16   Nona Dell, PA-C  ibuprofen (ADVIL,MOTRIN) 600 MG tablet Take 1 tablet (600 mg total) by mouth every 6 (six) hours as needed. 02/08/16   Nona Dell, PA-C  Multiple Vitamin (MULTIVITAMIN WITH MINERALS) TABS tablet Take 1 tablet by mouth daily.    [provider]  POTASSIUM GLUCONATE PO Take 1 tablet by mouth daily.    [provider]    Physical Exam: Vitals:   03/05/21 1200 03/05/21 1300 03/05/21 1400 03/05/21 1530  BP: (!) 159/94 (!) 181/85 (!) 167/93 (!) 162/98  Pulse: 97 99 84 82  Resp: 16 17 (!) 21 17  Temp: 99 F (37.2 C)     TempSrc: Oral     SpO2: 98% 94% 97% 97%  Weight: 57.1 kg     Height: 5\' 5"  (1.651 m)        Constitutional: NAD, calm, comfortable, underweight Eyes: EOMI, lids and conjunctivae normal ENMT: Mucous membranes are  moist.  Hearing grossly normal Neck: normal, supple, no masses, no thyromegaly Respiratory: CTAB, no wheezing, no crackles. Normal respiratory effort. No accessory muscle use.  Cardiovascular: RRR, no murmurs / rubs / gallops. No extremity edema. 2+ pedal pulses. No carotid bruits.  Abdomen: soft, mild right upper quadrant tenderness, ND, no masses or HSM palpated. +Bowel sounds.  Musculoskeletal: no clubbing / cyanosis. No joint deformity upper and lower extremities. Normal  muscle tone.  Skin: dry, intact, normal color, normal temperature Neurologic: CN 2-12 grossly intact. Normal speech.  Grossly non-focal exam. Psychiatric: Alert and oriented x 3. Normal mood. Congruent affect.  Normal judgement and insight.  Labs on Admission: I have personally reviewed following labs and imaging studies  CBC: Recent Labs  Lab 03/05/21 1157  WBC 5.6  HGB 13.7  HCT 39.4  MCV 93.4  PLT 505*   Basic Metabolic Panel: Recent Labs  Lab 03/05/21 1157 03/05/21 1255  NA 133*  --   K 3.7  --   CL 99  --   CO2 24  --   GLUCOSE 96  --   BUN 19  --   CREATININE 0.78  --   CALCIUM 9.7  --   MG  --  2.0   GFR: Estimated Creatinine Clearance: 64.4 mL/min (by C-G formula based on SCr of 0.78 mg/dL). Liver Function Tests: Recent Labs  Lab 03/05/21 1203  AST 112*  ALT 90*  ALKPHOS 60  BILITOT 0.6  PROT 8.2*  ALBUMIN 3.7   Recent Labs  Lab 03/05/21 1336  LIPASE 34   No results for input(s): AMMONIA in the last 168 hours. Coagulation Profile: Recent Labs  Lab 03/05/21 1255  INR 1.1   Cardiac Enzymes: No results for input(s): CKTOTAL, CKMB, CKMBINDEX, TROPONINI in the last 168 hours. BNP (last 3 results) No results for input(s): PROBNP in the last 8760 hours. HbA1C: No results for input(s): HGBA1C in the last 72 hours. CBG: Recent Labs  Lab 03/05/21 1303  GLUCAP 91   Lipid Profile: No results for input(s): CHOL, HDL, LDLCALC, TRIG, CHOLHDL, LDLDIRECT in the last 72 hours. Thyroid Function Tests: Recent Labs    03/05/21 1245  TSH 2.535   Anemia Panel: No results for input(s): VITAMINB12, FOLATE, FERRITIN, TIBC, IRON, RETICCTPCT in the last 72 hours. Urine analysis:    Component Value Date/Time   COLORURINE YELLOW 03/05/2021 [place] 03/05/2021 1335   LABSPEC 1.017 03/05/2021 1335   PHURINE 6.0 03/05/2021 1335   GLUCOSEU NEGATIVE 03/05/2021 1335   [place] 03/05/2021 1335   BILIRUBINUR NEGATIVE 03/05/2021 1335    KETONESUR NEGATIVE 03/05/2021 1335   PROTEINUR NEGATIVE 03/05/2021 1335   UROBILINOGEN 0.2 05/05/2007 2151   NITRITE NEGATIVE 03/05/2021 1335   LEUKOCYTESUR NEGATIVE 03/05/2021 1335    Radiological Exams on Admission: CT Abdomen Pelvis Wo Contrast  Result Date: 03/05/2021 CLINICAL DATA:  Chest pain since yesterday.  Weight loss.  Weakness. EXAM: CT CHEST, ABDOMEN AND PELVIS WITHOUT CONTRAST TECHNIQUE: Multidetector CT imaging of the chest, abdomen and pelvis was performed following the standard protocol without IV contrast. COMPARISON:  05/05/2007 abdominopelvic CT, report only. Chest radiograph of earlier today. FINDINGS: CT CHEST FINDINGS Cardiovascular: Aortic atherosclerosis. Tortuous thoracic aorta. Normal heart size, without pericardial effusion. Left main coronary artery calcification. Distal right or left circumflex coronary calcification as well. Mediastinum/Nodes: No supraclavicular adenopathy. No mediastinal or definite hilar adenopathy, given limitations of unenhanced CT. Esophageal fluid level on 30/3. Lungs/Pleura: No pleural fluid. Right lower lobe calcified  granuloma on 119/5. Probable subpleural lymph node in the superior segment right lower lobe at 6 mm on 65/5. Left upper lobe calcified granuloma on 36/5. Musculoskeletal: Lower thoracic and lower cervical spondylosis. CT ABDOMEN PELVIS FINDINGS Hepatobiliary: Dominant hepatic dome area of hypoattenuation measures 6.1 x 7.6 cm on 58/3. A separate posterior right hepatic lobe hypoattenuating lesion of 1.7 cm on 62/3. Normal gallbladder, without biliary ductal dilatation. Pancreas: Normal, without mass or ductal dilatation. Spleen: Normal in size, without focal abnormality. Adrenals/Urinary Tract: Normal adrenal glands. Left renal collecting system calculi of maximally 6 mm in the upper pole. No right-sided renal calculi. No hydronephrosis. No bladder calculi. Stomach/Bowel: Normal stomach, without wall thickening. Normal colon, appendix,  and terminal ileum. Normal small bowel. Vascular/Lymphatic: Aortic atherosclerosis. No abdominopelvic adenopathy. Reproductive: Trace bilateral hydroceles are likely physiologic. Normal prostate. Other: No significant free fluid. Musculoskeletal: S-shaped thoracolumbar spine curvature. Thoracolumbar spondylosis. IMPRESSION: 1. Right hepatic lobe masses which are suspicious for either metastatic disease or primary malignancy such as hepatocellular carcinoma. These are suboptimally evaluated on this noncontrast exam. Consider multidisciplinary oncology consultation with potential clinical strategies of tissue sampling versus PET to direct sampling. 2. No primary malignancy identified within the chest, abdomen, or pelvis, given limitation of noncontrast exam. 3. Left nephrolithiasis. 4. Coronary artery atherosclerosis. Aortic Atherosclerosis (ICD10-I70.0). 5. Esophageal air fluid level suggests dysmotility or gastroesophageal reflux. Electronically Signed   By: Abigail Miyamoto M.D.   On: 03/05/2021 14:07   DG Chest 2 View  Result Date: 03/05/2021 CLINICAL DATA:  Intermittent chest pain since yesterday. EXAM: CHEST - 2 VIEW COMPARISON:  02/08/2016 FINDINGS: Heart size is normal. Minor aortic atherosclerotic calcification is noted. The pulmonary vascularity is normal. The lungs are clear. No effusions. No significant bone finding IMPRESSION: No active disease.  Minor aortic atherosclerotic calcification. Electronically Signed   By: Nelson Chimes M.D.   On: 03/05/2021 12:53   CT Chest Wo Contrast  Result Date: 03/05/2021 CLINICAL DATA:  Chest pain since yesterday.  Weight loss.  Weakness. EXAM: CT CHEST, ABDOMEN AND PELVIS WITHOUT CONTRAST TECHNIQUE: Multidetector CT imaging of the chest, abdomen and pelvis was performed following the standard protocol without IV contrast. COMPARISON:  05/05/2007 abdominopelvic CT, report only. Chest radiograph of earlier today. FINDINGS: CT CHEST FINDINGS Cardiovascular: Aortic  atherosclerosis. Tortuous thoracic aorta. Normal heart size, without pericardial effusion. Left main coronary artery calcification. Distal right or left circumflex coronary calcification as well. Mediastinum/Nodes: No supraclavicular adenopathy. No mediastinal or definite hilar adenopathy, given limitations of unenhanced CT. Esophageal fluid level on 30/3. Lungs/Pleura: No pleural fluid. Right lower lobe calcified granuloma on 119/5. Probable subpleural lymph node in the superior segment right lower lobe at 6 mm on 65/5. Left upper lobe calcified granuloma on 36/5. Musculoskeletal: Lower thoracic and lower cervical spondylosis. CT ABDOMEN PELVIS FINDINGS Hepatobiliary: Dominant hepatic dome area of hypoattenuation measures 6.1 x 7.6 cm on 58/3. A separate posterior right hepatic lobe hypoattenuating lesion of 1.7 cm on 62/3. Normal gallbladder, without biliary ductal dilatation. Pancreas: Normal, without mass or ductal dilatation. Spleen: Normal in size, without focal abnormality. Adrenals/Urinary Tract: Normal adrenal glands. Left renal collecting system calculi of maximally 6 mm in the upper pole. No right-sided renal calculi. No hydronephrosis. No bladder calculi. Stomach/Bowel: Normal stomach, without wall thickening. Normal colon, appendix, and terminal ileum. Normal small bowel. Vascular/Lymphatic: Aortic atherosclerosis. No abdominopelvic adenopathy. Reproductive: Trace bilateral hydroceles are likely physiologic. Normal prostate. Other: No significant free fluid. Musculoskeletal: S-shaped thoracolumbar spine curvature. Thoracolumbar spondylosis. IMPRESSION: 1. Right hepatic  lobe masses which are suspicious for either metastatic disease or primary malignancy such as hepatocellular carcinoma. These are suboptimally evaluated on this noncontrast exam. Consider multidisciplinary oncology consultation with potential clinical strategies of tissue sampling versus PET to direct sampling. 2. No primary malignancy  identified within the chest, abdomen, or pelvis, given limitation of noncontrast exam. 3. Left nephrolithiasis. 4. Coronary artery atherosclerosis. Aortic Atherosclerosis (ICD10-I70.0). 5. Esophageal air fluid level suggests dysmotility or gastroesophageal reflux. Electronically Signed   By: Abigail Miyamoto M.D.   On: 03/05/2021 14:07   CT L-SPINE NO CHARGE  Result Date: 03/05/2021 CLINICAL DATA:  Weight loss over the last month. Chest and abdominal pain. EXAM: CT LUMBAR SPINE WITHOUT CONTRAST TECHNIQUE: Multidetector CT imaging of the lumbar spine was performed without intravenous contrast administration. Multiplanar CT image reconstructions were also generated. 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 Alignment: Thoracolumbar curvature convex to the left and lower lumbar curvature convex to the right. Vertebrae: No fracture or primary bone lesion. Discogenic sclerotic changes at the L1-2 level. Paraspinal and other soft tissues: See results of abdominal CT. Disc levels: T11-12: Mild disc bulge and facet osteoarthritis. No compressive stenosis. T12-L1: Disc bulge and facet osteoarthritis. Mild right foraminal narrowing, not likely significant. L1-2: Chronic disc degeneration with loss of disc height, endplate osteophytes and sclerotic change of the endplates. Facet degeneration and hypertrophy. Stenosis of both lateral recesses and foramina, right more than left. Neural compression could occur at this level, particularly on the right. L2-3: Endplate osteophytes and bulging of the disc. Facet and ligamentous hypertrophy. Mild stenosis of both lateral recesses and neural foramina that could possibly be significant. L3-4: Circumferential bulging of the disc. Facet and ligamentous hypertrophy. Severe multifactorial stenosis likely to cause neural compression. L4-5: Endplate osteophytes and bulging of the disc. Facet degeneration and hypertrophy. Stenosis of both lateral recesses  and foramina. Neural compression could occur on either side. L5-S1: Transitional level. Sufficient patency of the canal foramina. 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 Correlation with this numbering scheme would be important should intervention be contemplated. 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 L4-5: Stenosis of both lateral recesses and neural foramina that would likely cause neural compression, particularly with respect to the exiting L4 nerves. L2-3: Mild stenosis of the lateral recesses and neural foramina. L1-2: Lateral recess and foraminal stenosis right more than left. Neural compression could possibly occur, particularly on the right. Electronically Signed   By: Nelson Chimes M.D.   On: 03/05/2021 13:41     Assessment/Plan Active Problems:   RUQ abdominal pain   RUQ abdominal pain/chest pain -suspect related to his liver lesions.  Patient complained of deep inspiration during physical exam aggravating his right-sided pain, concerning for possible PE.  Given likely malignancy, he is high risk.  Mildly tachycardic even though pain controlled.  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  CT chest abdomen pelvis did not show any other signs of malignancy, limited by lack of contrast however. --Consult oncology tomorrow -- Will need biopsy and further evaluation for staging  Hypertension -BP elevated in the ED.  Patient is not on antihypertensives but does not follow with any healthcare providers.  Suspect history of untreated hypertension. -- As needed oral hydralazine for now -- Start a daily medication if indicated  DVT  prophylaxis: enoxaparin (LOVENOX) injection 40 mg Start: 03/05/21 1615   Code Status: Full Family Communication: Patient's ex-wife was at  bedside during admission encounter.  She is patient's point of contact and surrogate if needed.  She checks on patient about once a month but lives a couple hours away. Disposition Plan: Home health versus SNF pending therapy evaluations.  May require placement. Consults called: EDP spoke with oncology.  Will consult formally tomorrow  Admission status:  Status is: Observation  The patient remains OBS appropriate and will d/c before 2 midnights.  Dispo: The patient is from: Home              Anticipated d/c is to: SNF  (likely)              Patient currently is not medically stable to d/c.   Difficult to place patient No      Ezekiel Slocumb, DO [facility]  03/05/2021, 4:14 PM    If 7PM-7AM, please contact night-coverage. How to contact the [facility] Attending or Consulting provider [place] or covering provider during after hours [place], for this patient?    1. Check the care team in [facility] and look for a) attending/consulting [facility] provider listed and b) the [facility] team listed 2. Log into [email or web address] and use 's universal password to access. If you do not have the password, please contact the hospital operator. 3. Locate the [facility] provider you are looking for under [facility] and page to a number that you can be directly reached. 4. If you still have difficulty reaching the provider, please page the [facility] (Director on Call) for the Hospitalists listed on amion for assistance.

## 2021-03-06 ENCOUNTER — Observation Stay (HOSPITAL_BASED_OUTPATIENT_CLINIC_OR_DEPARTMENT_OTHER): Payer: Medicare Other

## 2021-03-06 ENCOUNTER — Observation Stay (HOSPITAL_COMMUNITY): Payer: Medicare Other

## 2021-03-06 DIAGNOSIS — R160 Hepatomegaly, not elsewhere classified: Secondary | ICD-10-CM | POA: Diagnosis not present

## 2021-03-06 DIAGNOSIS — R52 Pain, unspecified: Secondary | ICD-10-CM | POA: Diagnosis not present

## 2021-03-06 DIAGNOSIS — Z7982 Long term (current) use of aspirin: Secondary | ICD-10-CM | POA: Diagnosis not present

## 2021-03-06 DIAGNOSIS — Z20822 Contact with and (suspected) exposure to covid-19: Secondary | ICD-10-CM | POA: Diagnosis present

## 2021-03-06 DIAGNOSIS — E8809 Other disorders of plasma-protein metabolism, not elsewhere classified: Secondary | ICD-10-CM | POA: Diagnosis present

## 2021-03-06 DIAGNOSIS — R1011 Right upper quadrant pain: Secondary | ICD-10-CM

## 2021-03-06 DIAGNOSIS — E871 Hypo-osmolality and hyponatremia: Secondary | ICD-10-CM | POA: Diagnosis present

## 2021-03-06 DIAGNOSIS — C220 Liver cell carcinoma: Secondary | ICD-10-CM | POA: Diagnosis present

## 2021-03-06 DIAGNOSIS — I10 Essential (primary) hypertension: Secondary | ICD-10-CM | POA: Diagnosis present

## 2021-03-06 DIAGNOSIS — Z8614 Personal history of Methicillin resistant Staphylococcus aureus infection: Secondary | ICD-10-CM | POA: Diagnosis not present

## 2021-03-06 DIAGNOSIS — Z8249 Family history of ischemic heart disease and other diseases of the circulatory system: Secondary | ICD-10-CM | POA: Diagnosis not present

## 2021-03-06 DIAGNOSIS — Z79899 Other long term (current) drug therapy: Secondary | ICD-10-CM | POA: Diagnosis not present

## 2021-03-06 LAB — COMPREHENSIVE METABOLIC PANEL
ALT: 81 U/L — ABNORMAL HIGH (ref 0–44)
AST: 96 U/L — ABNORMAL HIGH (ref 15–41)
Albumin: 3.3 g/dL — ABNORMAL LOW (ref 3.5–5.0)
Alkaline Phosphatase: 48 U/L (ref 38–126)
Anion gap: 7 (ref 5–15)
BUN: 15 mg/dL (ref 8–23)
CO2: 28 mmol/L (ref 22–32)
Calcium: 9.4 mg/dL (ref 8.9–10.3)
Chloride: 100 mmol/L (ref 98–111)
Creatinine, Ser: 0.63 mg/dL (ref 0.61–1.24)
GFR, Estimated: >60 mL/min (ref >60)
Glucose, Bld: 97 mg/dL (ref 70–99)
Potassium: 3.9 mmol/L (ref 3.5–5.1)
Sodium: 135 mmol/L (ref 135–145)
Total Bilirubin: 0.7 mg/dL (ref 0.3–1.2)
Total Protein: 7.6 g/dL (ref 6.5–8.1)

## 2021-03-06 LAB — HEPATITIS PANEL, ACUTE
HCV Ab: REACTIVE — AB
Hep A IgM: NONREACTIVE
Hep B C IgM: NONREACTIVE
Hepatitis B Surface Ag: NONREACTIVE

## 2021-03-06 LAB — HIV ANTIBODY (ROUTINE TESTING W REFLEX): HIV Screen 4th Generation wRfx: NONREACTIVE

## 2021-03-06 IMAGING — MR MR ABDOMEN WO/W CM
19 series · 48 of 48 positions shown · IV contrast (5ml GADAVIST)
Comparison: CT on [DATE]

CLINICAL DATA: Hepatic masses on recent CT.

EXAM:
MRI ABDOMEN WITHOUT AND WITH CONTRAST
TECHNIQUE: Multiplanar multisequence MR imaging of the abdomen was performed
both before and after the administration of intravenous contrast.
CONTRAST:  5mL GADAVIST GADOBUTROL 1 MMOL/ML IV SOLN

[Series 3: T2 · coronal · 6.0mm · 1.68mm/px · 2 of 32 slices shown (1 of 2)]
[im 1/32]
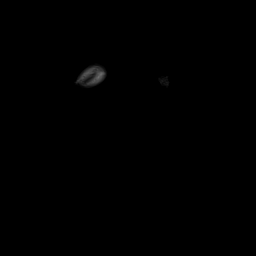
[im 32/32]
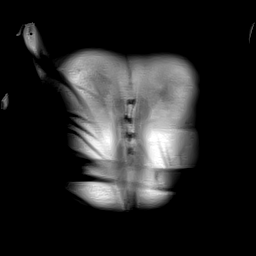

[Series 4: T2 fat-sat · axial · 6.0mm · 1.34mm/px · z∈[-117,+135]mm · 2 of 36 slices shown (1 of 2)]
[im 1/36]
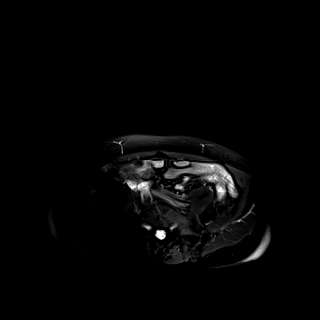
[im 36/36]
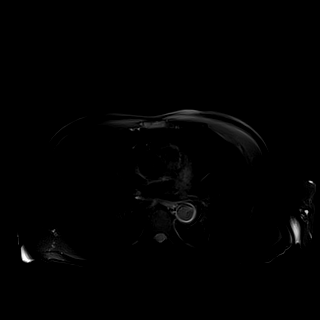

[Series 5: T2 fat-sat · 2 of 5 slices shown (2 of 2)]
[im 1/5]
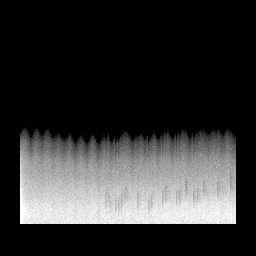
[im 5/5]
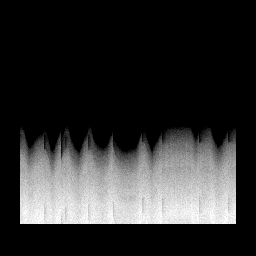

[Series 6: T1 · axial · 3.0mm · 1.34mm/px · z∈[-129,+108]mm · 3 of 80 slices shown (1 of 2)]
[im 1/80]
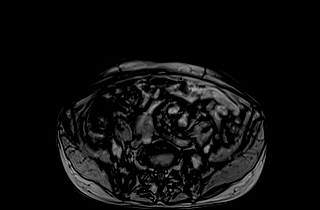
[im 40/80]
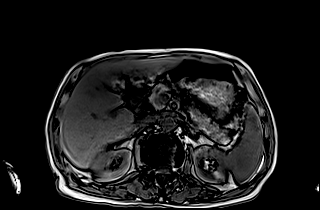
[im 80/80]
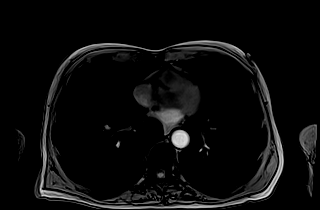

[Series 7: T1 · axial · 3.0mm · 1.34mm/px · z∈[-129,+108]mm · 3 of 80 slices shown (2 of 2)]
[im 1/80]
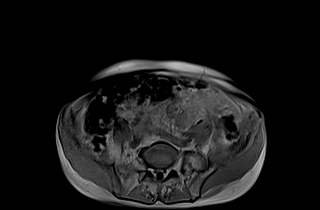
[im 40/80]
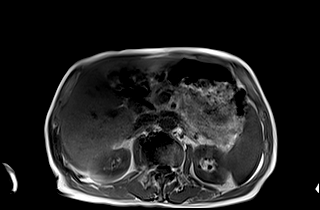
[im 80/80]
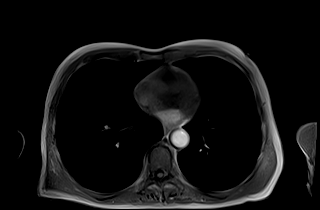

[Series 8: DWI · axial · 6.0mm · 1.60mm/px · z∈[-129,+108]mm · 3 of 68 slices shown (1 of 2)]
[im 1/68]
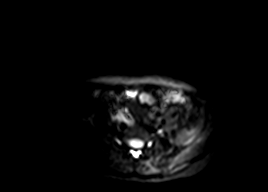
[im 34/68]
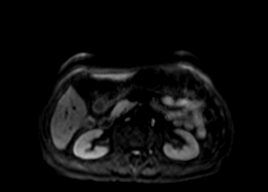
[im 68/68]
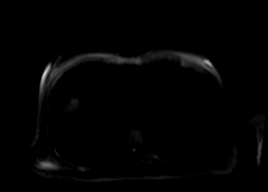

[Series 9: DWI · axial · 6.0mm · 1.60mm/px · 1 of 34 slices shown (2 of 2)]
[im 1/34]
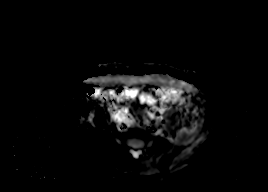

[Series 10: bSSFP · axial · 4.0mm · 0.84mm/px · z∈[-131,+109]mm · 2 of 61 slices shown]
[im 1/61]
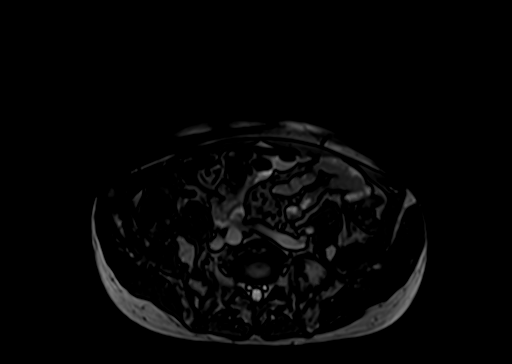
[im 61/61]
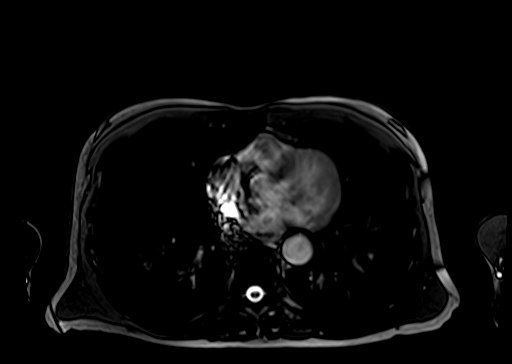

[Series 12: T1 dynamic · axial · 3.0mm · 1.34mm/px · z∈[-129,+108]mm · 3 of 80 slices shown (1 of 10)]
[im 1/80]
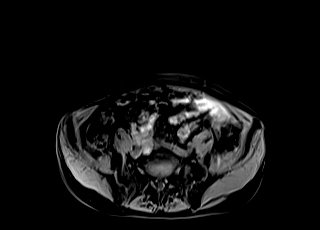
[im 40/80]
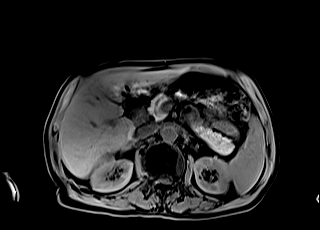
[im 80/80]
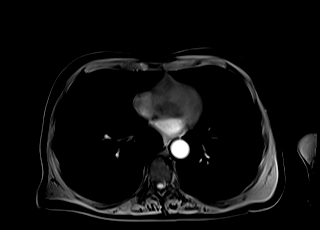

[Series 16: T1 dynamic · axial · 3.0mm · 1.34mm/px · z∈[-129,+108]mm · 3 of 80 slices shown (2 of 10)]
[im 1/80]
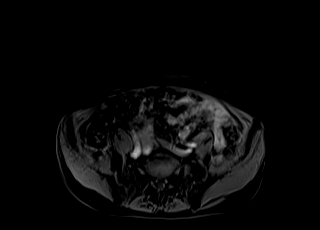
[im 40/80]
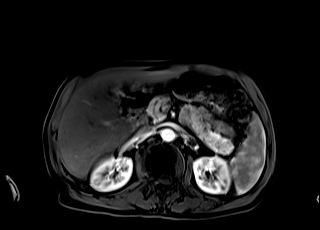
[im 80/80]
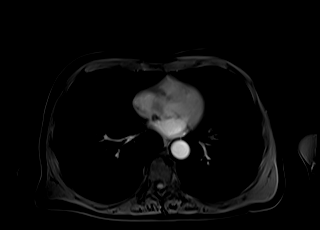

[Series 17: T1 dynamic · axial · 3.0mm · 1.34mm/px · z∈[-129,+108]mm · 3 of 80 slices shown (3 of 10)]
[im 1/80]
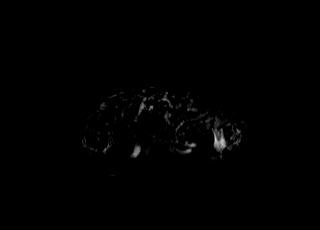
[im 40/80]
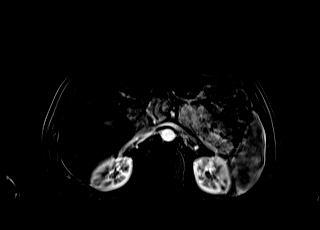
[im 80/80]
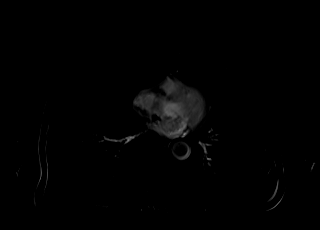

[Series 20: T1 dynamic · axial · 3.0mm · 1.34mm/px · z∈[-129,+108]mm · 3 of 80 slices shown (4 of 10)]
[im 1/80]
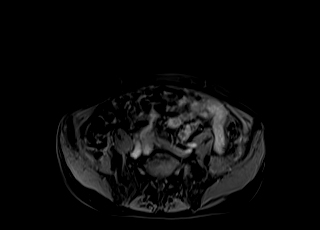
[im 40/80]
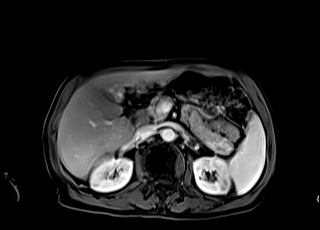
[im 80/80]
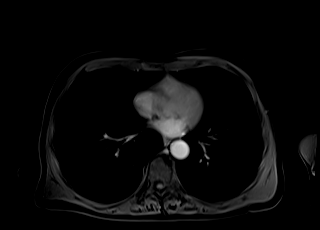

[Series 21: T1 dynamic · axial · 3.0mm · 1.34mm/px · z∈[-129,+108]mm · 3 of 80 slices shown (5 of 10)]
[im 1/80]
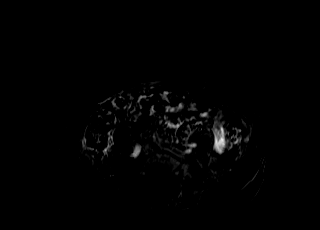
[im 40/80]
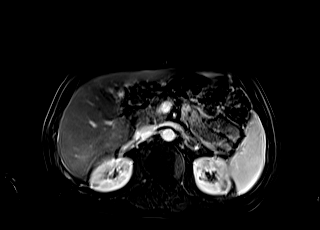
[im 80/80]
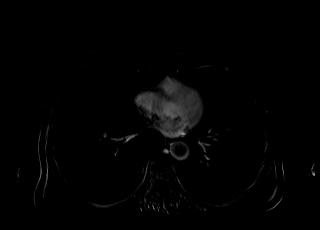

[Series 24: T1 dynamic · axial · 3.0mm · 1.34mm/px · z∈[-129,+108]mm · 3 of 80 slices shown (6 of 10)]
[im 1/80]
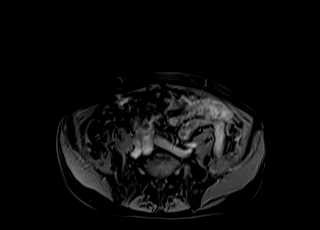
[im 40/80]
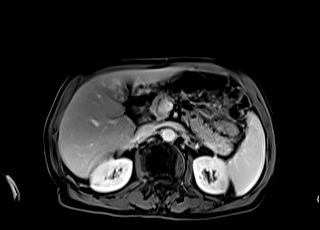
[im 80/80]
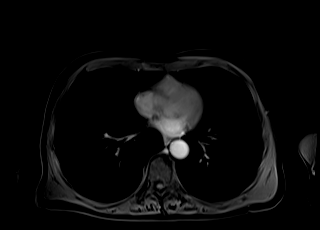

[Series 25: T1 dynamic · axial · 3.0mm · 1.34mm/px · z∈[-129,+108]mm · 3 of 80 slices shown (7 of 10)]
[im 1/80]
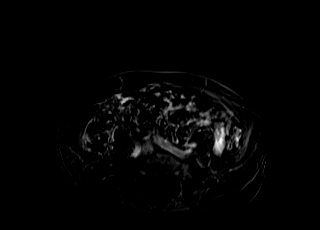
[im 40/80]
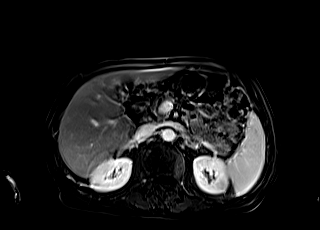
[im 80/80]
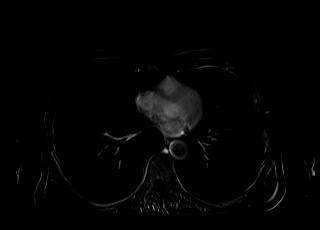

[Series 27: T1 dynamic · coronal · 5.0mm · 1.41mm/px · 2 of 44 slices shown (8 of 10)]
[im 1/44]
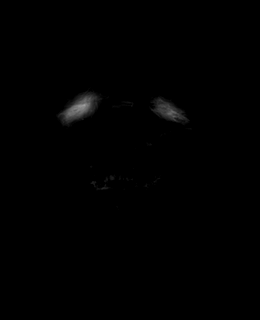
[im 44/44]
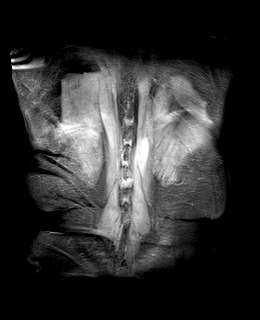

[Series 28: T2 · axial · 6.0mm · 1.68mm/px · 1 of 34 slices shown (2 of 2)]
[im 1/34]
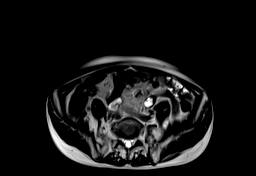

[Series 31: T1 dynamic · axial · 3.0mm · 1.34mm/px · z∈[-129,+108]mm · 3 of 80 slices shown (9 of 10)]
[im 1/80]
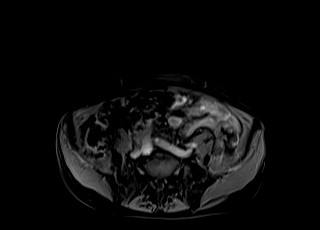
[im 40/80]
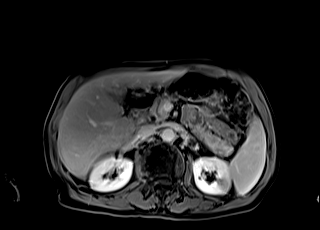
[im 80/80]
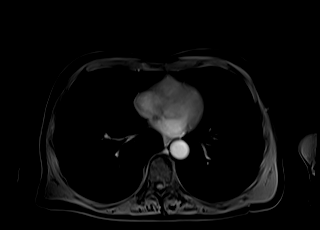

[Series 32: T1 dynamic · axial · 3.0mm · 1.34mm/px · z∈[-129,+108]mm · 3 of 80 slices shown (10 of 10)]
[im 1/80]
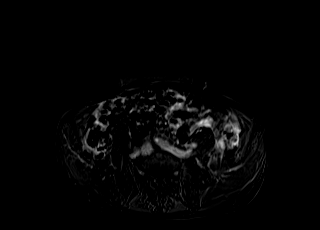
[im 40/80]
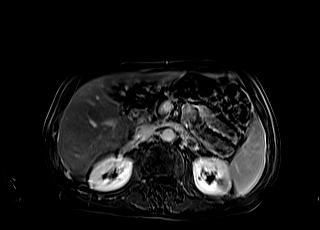
[im 80/80]
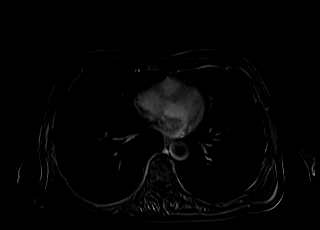

[48 of 48 positions shown; findings below may reference images not displayed]

FINDINGS: Lower chest: No acute findings.

Hepatobiliary: A heterogeneously enhancing hypovascular mass is seen
which is centered in segment 8 in the liver dome, measuring 9.5 x
6.6 cm on image 23/16. A small mass is seen in segment [DATE] of the
right lobe, which shows arterial phase hyperenhancement, contrast
washout, and delayed peripheral rim enhancement. This measures 1.3 x
1.2 cm on image 33/24. A tiny sub-cm subcapsular cyst is seen in the
posterior right hepatic lobe. No other liver masses are identified.
No evidence of portal or hepatic vein thrombus. No definite gross
morphologic findings of cirrhosis are noted.

Gallbladder is unremarkable. No evidence of biliary ductal
dilatation.

Pancreas:  No mass or inflammatory changes.

Spleen:  Within normal limits in size and appearance.

Adrenals/Urinary Tract: No masses identified. Tiny sub-cm left renal
cyst noted. No evidence of hydronephrosis.

Stomach/Bowel: Visualized portion unremarkable.

Vascular/Lymphatic: No pathologically enlarged lymph nodes
identified. No abdominal aortic aneurysm.

Other:  None.

Musculoskeletal:  No suspicious bone lesions identified.
IMPRESSION: 9.5 cm heterogeneous hypovascular mass in anterior right hepatic
lobe, and 1.3 cm hypervascular mass in the posterior right hepatic
lobe. Differential diagnosis includes liver metastases and
multifocal hepatocellular carcinoma. Suggest correlation with serum
AFP level, and possible tissue sampling.

No evidence of abdominal metastatic disease.

## 2021-03-06 MED ORDER — OXYCODONE HCL 5 MG PO TABS: 5.0000 mg | ORAL_TABLET | ORAL | Status: DC | PRN

## 2021-03-06 MED ORDER — GADOBUTROL 1 MMOL/ML IV SOLN
5.0000 mL | Freq: Once | INTRAVENOUS | Status: AC | PRN
  Administered 2021-03-06: 5 mL via INTRAVENOUS

## 2021-03-06 MED ORDER — FENTANYL 12 MCG/HR TD PT72
1.0000 | MEDICATED_PATCH | TRANSDERMAL | Status: DC
  Administered 2021-03-06: 1 via TRANSDERMAL
  Filled 2021-03-06: qty 1

## 2021-03-06 NOTE — Progress Notes (Signed)
Bilateral lower extremity venous duplex completed. Refer to "CV Proc" under chart review to view preliminary results.  03/06/2021 9:19 AM Kelby Aline., MHA, RVT, RDCS, RDMS

## 2021-03-06 NOTE — Evaluation (Signed)
Occupational Therapy Evaluation Patient Details Name: Morio Widen MRN: [id number] DOB: 25-Aug-1946 Today's Date: 03/06/2021    History of Present Illness 75 year old male without any known past medical history mated with right-sided chest pain, generalized weakness lack of ability to perform ADLs.  Patient lives alone.  He also reports a 50 pound weight loss in the last 2 months.  His appetite is poor.  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   Clinical Impression   Mr. Shon Indelicato is a 75 year old man who is typically independent, active and lives alone. Patient reports 6 months ago he could run 6 miles in the morning. Today patient able to perform ADLs and mobility with min guard and RW due to decreased activity tolerance and impaired balance. Patient will benefit from skilled OT services while in hospital to improve deficits and learn compensatory strategies as needed in order to maintain strength and return home safely. Would recommend that patient have at least intermittent assistance from family at discharge.        Follow Up Recommendations  No OT follow up;Supervision - Intermittent    Equipment Recommendations  Tub/shower seat    Recommendations for Other Services       Precautions / Restrictions Precautions Precautions: Fall Restrictions Weight Bearing Restrictions: No      Mobility Bed Mobility Overal bed mobility: Modified Independent                  Transfers Overall transfer level: Needs assistance Equipment used: Rolling walker (2 wheeled) Transfers: Sit to/from Stand Sit to Stand: Min guard         General transfer comment: min guard for standing. Initially ambulation slow, unsteady with an anterior tilt without DME. Able to ambulate ambulate in hall with RW. Attempted again to remove walker - once again patient began to lose his balance and leaning  forward. Resumed RW. Gait slow but controlled with RW.    Balance Overall balance assessment: Needs assistance Sitting-balance support: No upper extremity supported Sitting balance-Leahy Scale: Good     Standing balance support: During functional activity Standing balance-Leahy Scale: Fair Standing balance comment: Able to take hands off of walker for ADLs - needs RW support with ambulation.                           ADL either performed or assessed with clinical judgement   ADL Overall ADL's : Needs assistance/impaired Eating/Feeding: Independent   Grooming: Independent   Upper Body Bathing: Supervision/ safety   Lower Body Bathing: Supervison/ safety   Upper Body Dressing : Supervision/safety   Lower Body Dressing: Supervision/safety   Toilet Transfer: Supervision/safety   Toileting- Clothing Manipulation and Hygiene: Supervision/safety       Functional mobility during ADLs: Min guard;Rolling walker       Vision Patient Visual Report: No change from baseline       Perception     Praxis      Pertinent Vitals/Pain Pain Assessment: Faces Faces Pain Scale: Hurts a little bit Pain Location: R flank - with breathing Pain Descriptors / Indicators: Grimacing Pain Intervention(s): Monitored during session;Limited activity within patient's tolerance     Hand Dominance Right   Extremity/Trunk Assessment Upper Extremity Assessment Upper Extremity Assessment: Overall WFL for tasks assessed (WNL ROM and 5/5 strength.)   Lower Extremity Assessment Lower Extremity Assessment: Defer to PT evaluation   Cervical / Trunk  Assessment Cervical / Trunk Assessment: Normal   Communication Communication Communication: No difficulties   Cognition Arousal/Alertness: Awake/alert Behavior During Therapy: WFL for tasks assessed/performed Overall Cognitive Status: Within Functional Limits for tasks assessed                                 General  Comments: Became a little emotional talking about his past and family.   General Comments       Exercises     Shoulder Instructions      Home Living Family/patient expects to be discharged to:: Private residence Living Arrangements: Alone Available Help at Discharge: Family;Available PRN/intermittently Type of Home: House Home Access: Level entry     Home Layout: One level     Bathroom Shower/Tub: Occupational psychologist: Standard     Home Equipment: Cane - single point          Prior Functioning/Environment Level of Independence: Independent                 OT Problem List: Decreased activity tolerance;Impaired balance (sitting and/or standing);Pain      OT Treatment/Interventions: Self-care/ADL training;DME and/or AE instruction;Energy conservation;Therapeutic activities;Balance training;Patient/family education    OT Goals(Current goals can be found in the care plan section) Acute Rehab OT Goals Patient Stated Goal: to get better and stronger OT Goal Formulation: With patient Time For Goal Achievement: 03/20/21 Potential to Achieve Goals: Good  OT Frequency: Min 2X/week   Barriers to D/C:            Co-evaluation PT/OT/SLP Co-Evaluation/Treatment: Yes Reason for Co-Treatment: For patient/therapist safety;To address functional/ADL transfers PT goals addressed during session: Mobility/safety with mobility OT goals addressed during session: ADL's and self-care      AM-PAC OT "6 Clicks" Daily Activity     Outcome Measure Help from another person eating meals?: None Help from another person taking care of personal grooming?: A Little Help from another person toileting, which includes using toliet, bedpan, or urinal?: A Little Help from another person bathing (including washing, rinsing, drying)?: A Little Help from another person to put on and taking off regular upper body clothing?: A Little Help from another person to put on and taking  off regular lower body clothing?: A Little 6 Click Score: 19   End of Session Equipment Utilized During Treatment: Gait belt;Rolling walker Nurse Communication: Mobility status  Activity Tolerance: Patient tolerated treatment well Patient left: in chair;with call bell/phone within reach  OT Visit Diagnosis: Unsteadiness on feet (R26.81)                Time: 5638-7564 OT Time Calculation (min): 30 min Charges:  OT General Charges $OT Visit: 1 Visit OT Evaluation $OT Eval Low Complexity: 1 Low  Treshaun Carrico, OTR/L [place]  Office [phone] Pager: [place] 03/06/2021, 3:43 PM

## 2021-03-06 NOTE — Progress Notes (Signed)
PROGRESS NOTE    Shawn Bailey  XNA:[id number] DOB: [birth year] DOA: 03/05/2021 PCP: Pcp, No   Brief Narrative: 75 year old male without any known past medical history mated with right-sided chest pain, generalized weakness lack of ability to perform ADLs.  Patient lives alone.  He also reports a 50 pound weight loss in the last 2 months.  His appetite is poor.  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  CTs were done without contrast.  No other findings of malignancy was noted.  Patient does not follow-up with a primary care physician on a regular basis.  He is requiring IV morphine for pain control.  Assessment & Plan:   Active Problems:   RUQ abdominal pain   #1 Multiple liver masses right-sided chest/abdominal pain likely related to liver lesions.  He is requiring IV morphine for pain control. CT chest with multiple liver masses suspicious for primary hepatocellular carcinoma versus metastasis. Consult IR for biopsy. Check HIV and hepatitis panel. Check alpha-fetoprotein carcinoembryonic antigen CA 19-9 Discussed with Dr. Irene Limbo, he will see this patient as an outpatient follow-up after biopsy. Continue IV morphine I will add oxycodone p.o.  #2 hypertension/tachycardia-likely from pain-he probably has undiagnosed hypertension.  Start him on metoprolol 12.5 twice a day.  #3 elevated LFTs follow levels likely from liver lesions no history of alcohol use  #4 mild hyponatremia sodium 133, asymptomatic follow levels  #5 hypoalbuminemia dietary consulted    Nutrition Problem: Predicted suboptimal nutrient intake Etiology: chronic illness (possible liver cancer)   Signs/Symptoms: per patient/family report    Interventions: Ensure Enlive (each supplement provides 350kcal and 20 grams of protein),Magic cup,MVI  Estimated body mass index is 20.95 kg/m as calculated from the following:   Height as  of this encounter: 5\' 5"  (1.651 m).   Weight as of this encounter: 57.1 kg.  DVT prophylaxis: Lovenox  code Status: Full code Family Communication: Called ex-wife unable to reach phone kept ringing Disposition Plan:  Status is:  inpatient   Dispo: The patient is from: Home              Anticipated d/c is to: Home              Patient currently is not medically stable to d/c.   Difficult to place patient No  Consultants: Discussed with oncology over the phone/chart message Procedures: None Antimicrobials: None Subjective: Continues to complain of severe right chest right upper abdomen pain, denies shortness of breath. Lower extremity venous Dopplers done today with no evidence of DVT. D-dimer 0.79. He is on room air not hypoxic.  Objective: Vitals:   03/05/21 1747 03/05/21 2042 03/06/21 0209 03/06/21 0546  BP: (!) 163/96 (!) 152/83 133/85 (!) 157/94  Pulse: 96 82 91 (!) 101  Resp: 17 18 18 18   Temp: 99.1 F (37.3 C) 98.4 F (36.9 C) 98.2 F (36.8 C) 97.9 F (36.6 C)  TempSrc: Oral Oral Oral Oral  SpO2: 98% 100% 98% 99%  Weight:      Height:        Intake/Output Summary (Last 24 hours) at 03/06/2021 1104 Last data filed at 03/06/2021 1000 Gross per 24 hour  Intake 2560 ml  Output 0 ml  Net 2560 ml   Filed Weights   03/05/21 1200  Weight: 57.1 kg    Examination:  General exam: Appears in  distress due to pain Respiratory system: Clear to auscultation. Respiratory effort normal. Cardiovascular system: S1 &  S2 heard, RRR. No JVD, murmurs, rubs, gallops or clicks. No pedal edema. Gastrointestinal system: Abdomen is scaphoid nondistended, soft and nontender. No organomegaly or masses felt. Normal bowel sounds heard. Central nervous system: Alert and oriented. No focal neurological deficits. Extremities: Symmetric 5 x 5 power. Skin: No rashes, lesions or ulcers Psychiatry: Judgement and insight appear normal. Mood & affect appropriate.     Data Reviewed: I have  personally reviewed following labs and imaging studies  CBC: Recent Labs  Lab 03/05/21 1157  WBC 5.6  HGB 13.7  HCT 39.4  MCV 93.4  PLT 161*   Basic Metabolic Panel: Recent Labs  Lab 03/05/21 1157 03/05/21 1255 03/06/21 0500  NA 133*  --  135  K 3.7  --  3.9  CL 99  --  100  CO2 24  --  28  GLUCOSE 96  --  97  BUN 19  --  15  CREATININE 0.78  --  0.63  CALCIUM 9.7  --  9.4  MG  --  2.0  --    GFR: Estimated Creatinine Clearance: 64.4 mL/min (by C-G formula based on SCr of 0.63 mg/dL). Liver Function Tests: Recent Labs  Lab 03/05/21 1203 03/06/21 0500  AST 112* 96*  ALT 90* 81*  ALKPHOS 60 48  BILITOT 0.6 0.7  PROT 8.2* 7.6  ALBUMIN 3.7 3.3*   Recent Labs  Lab 03/05/21 1336  LIPASE 34   No results for input(s): AMMONIA in the last 168 hours. Coagulation Profile: Recent Labs  Lab 03/05/21 1255  INR 1.1   Cardiac Enzymes: No results for input(s): CKTOTAL, CKMB, CKMBINDEX, TROPONINI in the last 168 hours. BNP (last 3 results) No results for input(s): PROBNP in the last 8760 hours. HbA1C: Recent Labs    03/05/21 1259  HGBA1C 5.2   CBG: Recent Labs  Lab 03/05/21 1303  GLUCAP 91   Lipid Profile: No results for input(s): CHOL, HDL, LDLCALC, TRIG, CHOLHDL, LDLDIRECT in the last 72 hours. Thyroid Function Tests: Recent Labs    03/05/21 1245  TSH 2.535   Anemia Panel: No results for input(s): VITAMINB12, FOLATE, FERRITIN, TIBC, IRON, RETICCTPCT in the last 72 hours. Sepsis Labs: Recent Labs  Lab 03/05/21 1255  LATICACIDVEN 1.1    Recent Results (from the past 240 hour(s))  Resp Panel by RT-PCR (Flu A&B, Covid) Nasopharyngeal Swab     Status: None   Collection Time: 03/05/21  4:22 PM   Specimen: Nasopharyngeal Swab; Nasopharyngeal(NP) swabs in vial transport medium  Result Value Ref Range Status   SARS Coronavirus 2 by RT PCR NEGATIVE NEGATIVE Final    Comment: (NOTE) SARS-CoV-2 target nucleic acids are NOT DETECTED.  The SARS-CoV-2  RNA is generally detectable in upper respiratory specimens during the acute phase of infection. The lowest concentration of SARS-CoV-2 viral copies this assay can detect is 138 copies/mL. A negative result does not preclude SARS-Cov-2 infection and should not be used as the sole basis for treatment or other patient management decisions. A negative result may occur with  improper specimen collection/handling, submission of specimen other than nasopharyngeal swab, presence of viral mutation(s) within the areas targeted by this assay, and inadequate number of viral copies(<138 copies/mL). A negative result must be combined with clinical observations, patient history, and epidemiological information. The expected result is Negative.  Fact Sheet for Patients:  EntrepreneurPulse.com.au  Fact Sheet for Healthcare Providers:  IncredibleEmployment.be  This test is no t yet approved or cleared by the [place] and  has been authorized for detection and/or diagnosis of SARS-CoV-2 by FDA under an Emergency Use Authorization (EUA). This EUA will remain  in effect (meaning this test can be used) for the duration of the COVID-19 declaration under Section 564(b)(1) of the Act, 21 U.S.C.section 360bbb-3(b)(1), unless the authorization is terminated  or revoked sooner.       Influenza A by PCR NEGATIVE NEGATIVE Final   Influenza B by PCR NEGATIVE NEGATIVE Final    Comment: (NOTE) The Xpert Xpress SARS-CoV-2/FLU/RSV plus assay is intended as an aid in the diagnosis of influenza from Nasopharyngeal swab specimens and should not be used as a sole basis for treatment. Nasal washings and aspirates are unacceptable for Xpert Xpress SARS-CoV-2/FLU/RSV testing.  Fact Sheet for Patients: EntrepreneurPulse.com.au  Fact Sheet for Healthcare Providers: IncredibleEmployment.be  This test is not yet approved or cleared by the  [place] FDA and has been authorized for detection and/or diagnosis of SARS-CoV-2 by FDA under an Emergency Use Authorization (EUA). This EUA will remain in effect (meaning this test can be used) for the duration of the COVID-19 declaration under Section 564(b)(1) of the Act, 21 U.S.C. section 360bbb-3(b)(1), unless the authorization is terminated or revoked.  Performed at [facility], [place] [address], [place], [place] [address]          Radiology Studies: CT Abdomen Pelvis Wo Contrast  Result Date: 03/05/2021 CLINICAL DATA:  Chest pain since yesterday.  Weight loss.  Weakness. EXAM: CT CHEST, ABDOMEN AND PELVIS WITHOUT CONTRAST TECHNIQUE: Multidetector CT imaging of the chest, abdomen and pelvis was performed following the standard protocol without IV contrast. COMPARISON:  05/05/2007 abdominopelvic CT, report only. Chest radiograph of earlier today. FINDINGS: CT CHEST FINDINGS Cardiovascular: Aortic atherosclerosis. Tortuous thoracic aorta. Normal heart size, without pericardial effusion. Left main coronary artery calcification. Distal right or left circumflex coronary calcification as well. Mediastinum/Nodes: No supraclavicular adenopathy. No mediastinal or definite hilar adenopathy, given limitations of unenhanced CT. Esophageal fluid level on 30/3. Lungs/Pleura: No pleural fluid. Right lower lobe calcified granuloma on 119/5. Probable subpleural lymph node in the superior segment right lower lobe at 6 mm on 65/5. Left upper lobe calcified granuloma on 36/5. Musculoskeletal: Lower thoracic and lower cervical spondylosis. CT ABDOMEN PELVIS FINDINGS Hepatobiliary: Dominant hepatic dome area of hypoattenuation measures 6.1 x 7.6 cm on 58/3. A separate posterior right hepatic lobe hypoattenuating lesion of 1.7 cm on 62/3. Normal gallbladder, without biliary ductal dilatation. Pancreas: Normal, without mass or ductal dilatation. Spleen: Normal in size, without focal  abnormality. Adrenals/Urinary Tract: Normal adrenal glands. Left renal collecting system calculi of maximally 6 mm in the upper pole. No right-sided renal calculi. No hydronephrosis. No bladder calculi. Stomach/Bowel: Normal stomach, without wall thickening. Normal colon, appendix, and terminal ileum. Normal small bowel. Vascular/Lymphatic: Aortic atherosclerosis. No abdominopelvic adenopathy. Reproductive: Trace bilateral hydroceles are likely physiologic. Normal prostate. Other: No significant free fluid. Musculoskeletal: S-shaped thoracolumbar spine curvature. Thoracolumbar spondylosis. IMPRESSION: 1. Right hepatic lobe masses which are suspicious for either metastatic disease or primary malignancy such as hepatocellular carcinoma. These are suboptimally evaluated on this noncontrast exam. Consider multidisciplinary oncology consultation with potential clinical strategies of tissue sampling versus PET to direct sampling. 2. No primary malignancy identified within the chest, abdomen, or pelvis, given limitation of noncontrast exam. 3. Left nephrolithiasis. 4. Coronary artery atherosclerosis. Aortic Atherosclerosis (ICD10-I70.0). 5. Esophageal air fluid level suggests dysmotility or gastroesophageal reflux. Electronically Signed   By: Abigail Miyamoto M.D.   On: 03/05/2021 14:07   DG  Chest 2 View  Result Date: 03/05/2021 CLINICAL DATA:  Intermittent chest pain since yesterday. EXAM: CHEST - 2 VIEW COMPARISON:  02/08/2016 FINDINGS: Heart size is normal. Minor aortic atherosclerotic calcification is noted. The pulmonary vascularity is normal. The lungs are clear. No effusions. No significant bone finding IMPRESSION: No active disease.  Minor aortic atherosclerotic calcification. Electronically Signed   By: Nelson Chimes M.D.   On: 03/05/2021 12:53   CT Chest Wo Contrast  Result Date: 03/05/2021 CLINICAL DATA:  Chest pain since yesterday.  Weight loss.  Weakness. EXAM: CT CHEST, ABDOMEN AND PELVIS WITHOUT CONTRAST  TECHNIQUE: Multidetector CT imaging of the chest, abdomen and pelvis was performed following the standard protocol without IV contrast. COMPARISON:  05/05/2007 abdominopelvic CT, report only. Chest radiograph of earlier today. FINDINGS: CT CHEST FINDINGS Cardiovascular: Aortic atherosclerosis. Tortuous thoracic aorta. Normal heart size, without pericardial effusion. Left main coronary artery calcification. Distal right or left circumflex coronary calcification as well. Mediastinum/Nodes: No supraclavicular adenopathy. No mediastinal or definite hilar adenopathy, given limitations of unenhanced CT. Esophageal fluid level on 30/3. Lungs/Pleura: No pleural fluid. Right lower lobe calcified granuloma on 119/5. Probable subpleural lymph node in the superior segment right lower lobe at 6 mm on 65/5. Left upper lobe calcified granuloma on 36/5. Musculoskeletal: Lower thoracic and lower cervical spondylosis. CT ABDOMEN PELVIS FINDINGS Hepatobiliary: Dominant hepatic dome area of hypoattenuation measures 6.1 x 7.6 cm on 58/3. A separate posterior right hepatic lobe hypoattenuating lesion of 1.7 cm on 62/3. Normal gallbladder, without biliary ductal dilatation. Pancreas: Normal, without mass or ductal dilatation. Spleen: Normal in size, without focal abnormality. Adrenals/Urinary Tract: Normal adrenal glands. Left renal collecting system calculi of maximally 6 mm in the upper pole. No right-sided renal calculi. No hydronephrosis. No bladder calculi. Stomach/Bowel: Normal stomach, without wall thickening. Normal colon, appendix, and terminal ileum. Normal small bowel. Vascular/Lymphatic: Aortic atherosclerosis. No abdominopelvic adenopathy. Reproductive: Trace bilateral hydroceles are likely physiologic. Normal prostate. Other: No significant free fluid. Musculoskeletal: S-shaped thoracolumbar spine curvature. Thoracolumbar spondylosis. IMPRESSION: 1. Right hepatic lobe masses which are suspicious for either metastatic disease  or primary malignancy such as hepatocellular carcinoma. These are suboptimally evaluated on this noncontrast exam. Consider multidisciplinary oncology consultation with potential clinical strategies of tissue sampling versus PET to direct sampling. 2. No primary malignancy identified within the chest, abdomen, or pelvis, given limitation of noncontrast exam. 3. Left nephrolithiasis. 4. Coronary artery atherosclerosis. Aortic Atherosclerosis (ICD10-I70.0). 5. Esophageal air fluid level suggests dysmotility or gastroesophageal reflux. Electronically Signed   By: Abigail Miyamoto M.D.   On: 03/05/2021 14:07   CT L-SPINE NO CHARGE  Result Date: 03/05/2021 CLINICAL DATA:  Weight loss over the last month. Chest and abdominal pain. EXAM: CT LUMBAR SPINE WITHOUT CONTRAST TECHNIQUE: Multidetector CT imaging of the lumbar spine was performed without intravenous contrast administration. Multiplanar CT image reconstructions were also generated. 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 Alignment: Thoracolumbar curvature convex to the left and lower lumbar curvature convex to the right. Vertebrae: No fracture or primary bone lesion. Discogenic sclerotic changes at the L1-2 level. Paraspinal and other soft tissues: See results of abdominal CT. Disc levels: T11-12: Mild disc bulge and facet osteoarthritis. No compressive stenosis. T12-L1: Disc bulge and facet osteoarthritis. Mild right foraminal narrowing, not likely significant. L1-2: Chronic disc degeneration with loss of disc height, endplate osteophytes and sclerotic change of the endplates. Facet degeneration and hypertrophy. Stenosis of both lateral recesses and foramina, right more than left. Neural compression could occur at  this level, particularly on the right. L2-3: Endplate osteophytes and bulging of the disc. Facet and ligamentous hypertrophy. Mild stenosis of both lateral recesses and neural foramina that could possibly be  significant. L3-4: Circumferential bulging of the disc. Facet and ligamentous hypertrophy. Severe multifactorial stenosis likely to cause neural compression. L4-5: Endplate osteophytes and bulging of the disc. Facet degeneration and hypertrophy. Stenosis of both lateral recesses and foramina. Neural compression could occur on either side. L5-S1: Transitional level. Sufficient patency of the canal foramina. 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 Correlation with this numbering scheme would be important should intervention be contemplated. 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 L4-5: Stenosis of both lateral recesses and neural foramina that would likely cause neural compression, particularly with respect to the exiting L4 nerves. L2-3: Mild stenosis of the lateral recesses and neural foramina. L1-2: Lateral recess and foraminal stenosis right more than left. Neural compression could possibly occur, particularly on the right. Electronically Signed   By: Nelson Chimes M.D.   On: 03/05/2021 13:41   VAS [place] LOWER EXTREMITY VENOUS (DVT)  Result Date: 03/06/2021  Lower Venous DVT Study Patient Name:  Shawn Bailey  Date of Exam:   03/06/2021 Medical Rec #: [id number]            Accession #:    [id number] Date of Birth: 06/12/1946            Patient Gender: M Patient Age:   74Y Exam Location:  [facility] Procedure:      VAS [place] LOWER EXTREMITY VENOUS (DVT) Referring Phys: [id number] KELLY A GRIFFITH --------------------------------------------------------------------------------  Indications: Malignancy.  Comparison Study: No prior study Performing Technologist: Maudry Mayhew MHA, RDMS, RVT, RDCS  Examination Guidelines: A complete evaluation includes B-mode imaging, spectral Doppler, color Doppler, and power Doppler as needed of all accessible portions of each vessel. Bilateral testing is considered an integral part of a complete examination. Limited examinations for  reoccurring indications may be performed as noted. The reflux portion of the exam is performed with the patient in reverse Trendelenburg.  +---------+---------------+---------+-----------+----------+--------------+ RIGHT    CompressibilityPhasicitySpontaneityPropertiesThrombus Aging +---------+---------------+---------+-----------+----------+--------------+ CFV      Full           Yes      Yes                                 +---------+---------------+---------+-----------+----------+--------------+ SFJ      Full                                                        +---------+---------------+---------+-----------+----------+--------------+ FV Prox  Full                                                        +---------+---------------+---------+-----------+----------+--------------+ FV Mid   Full                                                        +---------+---------------+---------+-----------+----------+--------------+  FV DistalFull                                                        +---------+---------------+---------+-----------+----------+--------------+ PFV      Full                                                        +---------+---------------+---------+-----------+----------+--------------+ POP      Full           Yes      Yes                                 +---------+---------------+---------+-----------+----------+--------------+ PTV      Full                                                        +---------+---------------+---------+-----------+----------+--------------+ PERO     Full                                                        +---------+---------------+---------+-----------+----------+--------------+   +---------+---------------+---------+-----------+----------+--------------+ LEFT     CompressibilityPhasicitySpontaneityPropertiesThrombus Aging  +---------+---------------+---------+-----------+----------+--------------+ CFV      Full           Yes      Yes                                 +---------+---------------+---------+-----------+----------+--------------+ SFJ      Full                                                        +---------+---------------+---------+-----------+----------+--------------+ FV Prox  Full                                                        +---------+---------------+---------+-----------+----------+--------------+ FV Mid   Full                                                        +---------+---------------+---------+-----------+----------+--------------+ FV DistalFull                                                        +---------+---------------+---------+-----------+----------+--------------+   PFV      Full                                                        +---------+---------------+---------+-----------+----------+--------------+ POP      Full           Yes      Yes                                 +---------+---------------+---------+-----------+----------+--------------+ PTV      Full                                                        +---------+---------------+---------+-----------+----------+--------------+ PERO     Full                                                        +---------+---------------+---------+-----------+----------+--------------+     Summary: RIGHT: - There is no evidence of deep vein thrombosis in the lower extremity.  - No cystic structure found in the popliteal fossa.  LEFT: - There is no evidence of deep vein thrombosis in the lower extremity.  - No cystic structure found in the popliteal fossa.  *See table(s) above for measurements and observations. Electronically signed by Deitra Mayo MD on 03/06/2021 at 10:06:53 AM.    Final         Scheduled Meds: . ascorbic acid  500 mg Oral Daily  . enoxaparin  (LOVENOX) injection  40 mg Subcutaneous Q24H  . feeding supplement  237 mL Oral BID BM  . multivitamin with minerals  1 tablet Oral Daily  . pneumococcal 23 valent vaccine  0.5 mL Intramuscular Tomorrow-1000  . senna  1 tablet Oral BID  . vitamin B-12  100 mcg Oral Daily   Continuous Infusions:   LOS: 0 days   Georgette Shell, MD  03/06/2021, 11:04 AM

## 2021-03-06 NOTE — Evaluation (Signed)
Physical Therapy Evaluation Patient Details Name: Shawn Bailey Bailey MRN: [id number] DOB: 12/26/1945 Today's Date: 03/06/2021   History of Present Illness  75 year old male without any known past medical history mated with right-sided chest pain, generalized weakness lack of ability to perform ADLs.  Patient lives alone.  He also reports a 50 pound weight loss in the last 2 months.  His appetite is poor.  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  Clinical Impression  Shawn Bailey Bailey is a 75 year old man who is typically independent, active and lives alone. Patient reports 6 months ago he could run 6 miles in the morning. Today patient requires use of RW due to decreased activity tolerance and impaired balance. Patient will benefit from skilled PT services while in hospital to improve deficits and learn compensatory strategies as needed in order to maintain strength and return home safely. Would recommend that patient have at least intermittent assistance from family at discharge.        Follow Up Recommendations Home health PT    Equipment Recommendations  Rolling walker with 5" wheels (Youth level)    Recommendations for Other Services       Precautions / Restrictions Precautions Precautions: Fall Restrictions Weight Bearing Restrictions: No      Mobility  Bed Mobility Overal bed mobility: Modified Independent                  Transfers Overall transfer level: Needs assistance Equipment used: None Transfers: Sit to/from Stand Sit to Stand: Min guard         General transfer comment: steady assist for balance  Ambulation/Gait Ambulation/Gait assistance: Min assist;Min guard Gait Distance (Feet): 250 Feet Assistive device: Rolling walker (2 wheeled) Gait Pattern/deviations: Step-through pattern;Decreased step length - right;Decreased step length - left;Shuffle;Trunk flexed      General Gait Details: Pt ambulated 18' with min assist and sans AD with noted instability and pt stepping very cautiously and reports "its my balance".  Pt ambulated additional 250' with RW and marked improvement in stability and pt noting "feeling steadier/safer".  Stairs            Wheelchair Mobility    Modified Rankin (Stroke Patients Only)       Balance Overall balance assessment: Needs assistance Sitting-balance support: No upper extremity supported Sitting balance-Leahy Scale: Good     Standing balance support: No upper extremity supported Standing balance-Leahy Scale: Fair Standing balance comment: Able to take hands off of walker for ADLs - needs RW support with ambulation.                             Pertinent Vitals/Pain Pain Assessment: Faces Faces Pain Scale: Hurts [name] Bailey bit Pain Location: R flank - with breathing Pain Descriptors / Indicators: Grimacing Pain Intervention(s): Limited activity within patient's tolerance;Monitored during session    [place] expects to be discharged to:: Private residence Living Arrangements: Alone Available Help at Discharge: Family;Available PRN/intermittently Type of Home: House Home Access: Level entry     Home Layout: One level Home Equipment: Cane - single point      Prior Function Level of Independence: Independent               Hand Dominance   Dominant Hand: Right    Extremity/Trunk Assessment   Upper Extremity Assessment Upper Extremity Assessment: Overall WFL for tasks assessed  Lower Extremity Assessment Lower Extremity Assessment: Overall WFL for tasks assessed (~4/5 throughout)    Cervical / Trunk Assessment Cervical / Trunk Assessment: Normal  Communication   Communication: No difficulties  Cognition Arousal/Alertness: Awake/alert Behavior During Therapy: WFL for tasks assessed/performed Overall Cognitive Status: Within Functional Limits for  tasks assessed                                 General Comments: Became [name] Bailey emotional talking about his past and family.      General Comments      Exercises     Assessment/Plan    PT Assessment Patient needs continued PT services  PT Problem List Decreased strength;Decreased activity tolerance;Decreased balance;Decreased mobility;Decreased knowledge of use of DME;Pain       PT Treatment Interventions DME instruction;Gait training;Functional mobility training;Therapeutic activities;Therapeutic exercise;Patient/family education;Balance training    PT Goals (Current goals can be found in the Care Plan section)  Acute Rehab PT Goals Patient Stated Goal: to get better and stronger PT Goal Formulation: With patient Time For Goal Achievement: 03/19/21 Potential to Achieve Goals: Good    Frequency Min 3X/week   Barriers to discharge Decreased caregiver support Pt lives alone    Co-evaluation PT/OT/SLP Co-Evaluation/Treatment: Yes Reason for Co-Treatment: For patient/therapist safety PT goals addressed during session: Mobility/safety with mobility OT goals addressed during session: ADL's and self-care       AM-PAC PT "6 Clicks" Mobility  Outcome Measure Help needed turning from your back to your side while in a flat bed without using bedrails?: None Help needed moving from lying on your back to sitting on the side of a flat bed without using bedrails?: None Help needed moving to and from a bed to a chair (including a wheelchair)?: [name] Bailey Help needed standing up from a chair using your arms (e.g., wheelchair or bedside chair)?: [name] Bailey Help needed to walk in hospital room?: [name] Bailey Help needed climbing 3-5 steps with a railing? : [name] Bailey 6 Click Score: 20    End of Session Equipment Utilized During Treatment: Gait belt Activity Tolerance: Patient tolerated treatment well Patient left: in chair;with call bell/phone within reach Nurse  Communication: Mobility status PT Visit Diagnosis: Difficulty in walking, not elsewhere classified (R26.2)    Time: 1354-1420 PT Time Calculation (min) (ACUTE ONLY): 26 min   Charges:   PT Evaluation $PT Eval Low Complexity: 1 Low          [place] Pager [phone] Office [phone]   Nixon Sparr 03/06/2021, 5:01 PM

## 2021-03-06 NOTE — TOC Initial Note (Addendum)
Transition of Care [facility]) - Initial/Assessment Note    Patient Details  Name: Shawn Bailey MRN: [id number] Date of Birth: [birth year]  [facility] CM/SW Contact:    Dessa Phi, RN Phone Number: 03/06/2021, 4:46 PM  Clinical Narrative:Spoke to patient's ex wife [name] Bravo-d/c plan home. Noted OT no f/u-recc shower stool [employer] notified. Await PT recc. [name] states patietn has no pcp, or script coverage-will leave [facility] pcp provider list in rm,also informed [name] of senior resoures tel#, & meals on wheels tel #. Patietn has transport home. May need MATCH for meds since [name] says no script coverage.Continue to monitor.                   Expected Discharge Plan: Home/Self Care Barriers to Discharge: Continued Medical Work up   Patient Goals and CMS Choice Patient states their goals for this hospitalization and ongoing recovery are:: go home CMS Medicare.gov Compare Post Acute Care list provided to:: Patient Represenative (must comment) ([name] ex wife) Choice offered to / list presented to :  (ex wife [name])  Expected Discharge Plan and Services Expected Discharge Plan: Home/Self Care   Discharge Planning Services: CM Consult   Living arrangements for the past 2 months: Single Family Home                                      Prior Living Arrangements/Services Living arrangements for the past 2 months: Single Family Home Lives with:: Self Patient language and need for interpreter reviewed:: Yes Do you feel safe going back to the place where you live?: Yes      Need for Family Participation in Patient Care: No (Comment) Care giver support system in place?: Yes (comment)   Criminal Activity/Legal Involvement Pertinent to Current Situation/Hospitalization: No - Comment as needed  Activities of Daily Living Home Assistive Devices/Equipment: None ADL Screening (condition at time of admission) Patient's cognitive ability adequate to safely  complete daily activities?: Yes Is the patient deaf or have difficulty hearing?: No Does the patient have difficulty seeing, even when wearing glasses/contacts?: Yes Does the patient have difficulty concentrating, remembering, or making decisions?: Yes Patient able to express need for assistance with ADLs?: Yes Does the patient have difficulty dressing or bathing?: No Independently performs ADLs?: No Communication: Independent Dressing (OT): Independent Grooming: Independent Feeding: Independent Bathing: Independent Toileting: Needs assistance Is this a change from baseline?: Pre-admission baseline In/Out Bed: Needs assistance Is this a change from baseline?: Pre-admission baseline Walks in Home: Needs assistance Is this a change from baseline?: Pre-admission baseline Does the patient have difficulty walking or climbing stairs?: Yes Weakness of Legs: Both Weakness of Arms/Hands: Both  Permission Sought/Granted Permission sought to share information with : Case Manager Permission granted to share information with : Yes, Verbal Permission Granted  Share Information with NAME: Case Manager     Permission granted to share info w Relationship: [name] wx wife [phone]     Emotional Assessment Appearance:: Appears stated age Attitude/Demeanor/Rapport: Gracious Affect (typically observed): Accepting Orientation: : Oriented to Self,Oriented to Place,Oriented to  Time Alcohol / Substance Use: Not Applicable Psych Involvement: No (comment)  Admission diagnosis:  Pain [R52] RUQ abdominal pain [R10.11] Liver mass, right lobe [R16.0] Patient Active Problem List   Diagnosis Date Noted  . Liver mass, right lobe   . Pain   . RUQ abdominal pain 03/05/2021   PCP:  Pcp,  No Pharmacy:   [facility] [place], [address] [place] [address] Phone: [phone] Fax:  [phone]     Social Determinants of Health (SDOH) Interventions    Readmission Risk Interventions No flowsheet data found.

## 2021-03-06 NOTE — Progress Notes (Signed)
Initial Nutrition Assessment  DOCUMENTATION CODES:   Not applicable  INTERVENTION:   -Continue Ensure Enlive po BID, each supplement provides 350 kcal and 20 grams of protein -Magic cup BID with meals, each supplement provides 290 kcal and 9 grams of protein -MVI with minerals daily   NUTRITION DIAGNOSIS:   Predicted suboptimal nutrient intake related to chronic illness (possible liver cancer) as evidenced by per patient/family report.  GOAL:   Patient will meet greater than or equal to 90% of their needs  MONITOR:   PO intake,Supplement acceptance,Labs,Weight trends,Skin,I & O's  REASON FOR ASSESSMENT:   Consult Assessment of nutrition requirement/status  ASSESSMENT:   Shawn Bailey is a 75 y.o. male without any known prior medical history (does not follow with any healthcare providers) who presented to the ED with complaints of right-sided chest pain since yesterday.  In addition, patient reported being severely weak resulting in near falls and lack of ability to perform ADLs.  Patient lives alone.  He also reports 35 pound weight loss in the past month, about 50 pounds in the last 2 months.  Denies fevers or chills, reports nausea without vomiting.  Reports diarrhea that occurs after eating.  Otherwise denies any acute complaints or recent illnesses.  Pt admitted with RUQ abdominal chest pain (suspect related to liver lesions) and rt hepatic lobe mass.   Reviewed I/O's: +2.3 L x 24 hours  Pt unavailable at time of visit. Attempted to speak with pt via call to hospital room phone, however, unable to reach. RD unable to obtain further nutrition-related history or complete nutrition-focused physical exam at this time.   Pt with fair oral intake; noted meal completions 60-75%.   Per chart review, pt reports a 50# wt loss over the past 2 months. No wt hx available to assess weight loss at this time.   Pt with increased nutritional needs and would benefit from addition  of oral nutrition supplements. Pt is at high risk for malnutrition, however, unable to identify at this time.  Medications reviewed and include vitamin C, senna, and vitamin B-12.   Labs reviewed.   Diet Order:   Diet Order            Diet regular Room service appropriate? Yes; Fluid consistency: Thin  Diet effective now                 EDUCATION NEEDS:   No education needs have been identified at this time  Skin:  Skin Assessment: Reviewed RN Assessment  Last BM:  03/05/21  Height:   Ht Readings from Last 1 Encounters:  03/05/21 5\' 5"  (1.651 m)    Weight:   Wt Readings from Last 1 Encounters:  03/05/21 57.1 kg    Ideal Body Weight:  61.8 kg  BMI:  Body mass index is 20.95 kg/m.  Estimated Nutritional Needs:   Kcal:  1800-2000  Protein:  100-115 grams  Fluid:  > 1.8 L    Loistine Chance, RD, LDN, [place] Registered Dietitian II Certified Diabetes Care and Education Specialist Please refer to [facility] for RD and/or RD on-call/weekend/after hours pager

## 2021-03-07 DIAGNOSIS — R160 Hepatomegaly, not elsewhere classified: Secondary | ICD-10-CM | POA: Diagnosis not present

## 2021-03-07 LAB — COMPREHENSIVE METABOLIC PANEL
ALT: 93 U/L — ABNORMAL HIGH (ref 0–44)
AST: 111 U/L — ABNORMAL HIGH (ref 15–41)
Albumin: 3.4 g/dL — ABNORMAL LOW (ref 3.5–5.0)
Alkaline Phosphatase: 53 U/L (ref 38–126)
Anion gap: 6 (ref 5–15)
BUN: 21 mg/dL (ref 8–23)
CO2: 27 mmol/L (ref 22–32)
Calcium: 9.8 mg/dL (ref 8.9–10.3)
Chloride: 102 mmol/L (ref 98–111)
Creatinine, Ser: 0.7 mg/dL (ref 0.61–1.24)
GFR, Estimated: >60 mL/min (ref >60)
Glucose, Bld: 99 mg/dL (ref 70–99)
Potassium: 3.9 mmol/L (ref 3.5–5.1)
Sodium: 135 mmol/L (ref 135–145)
Total Bilirubin: 0.5 mg/dL (ref 0.3–1.2)
Total Protein: 7.7 g/dL (ref 6.5–8.1)

## 2021-03-07 LAB — URINE CULTURE: Culture: NO GROWTH

## 2021-03-07 NOTE — Progress Notes (Signed)
Pt's son Hardeep Reetz. called and requested information r/t the pt's care. Pt was informed and gave permission to allow his son to know about his care via phone.

## 2021-03-07 NOTE — Consult Note (Signed)
Chief Complaint: Patient was seen in consultation today for liver lesion biopsy.  Referring Physician(s): Landis Gandy, MD  Supervising Physician: Jacqulynn Cadet  Patient Status: [facility] - In-pt  History of Present Illness: Shawn Bailey is a 75 y.o. male with a past medical history significant for depression who presented to the ED on 5/20 with complaints of right sided chest pain, generalized weakness and poor appetite. He has been unable to care for himself and lives alone. He was admitted for further evaluation and workup showed a right hepatic lobe mass concerning for neoplasm. IR has been asked to perform a liver lesion biopsy for further evaluation.   Shawn Bailey seen in his room, he is very appreciative of the care he has been receiving. 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 He has a son and a daughter who he is very proud of, they visit him often. He describes his ex-wife as his best friend and she is the one who brought him here because he was not well. She helps him with written English and some other things, she also visits him frequently. He understands that he has some spots on his liver that may be cancer and that we have been asked to get some samples for further testing, he is very agreeable to proceed.   Past Medical History:  Diagnosis Date  . Depression   . Parasite infestation     Past Surgical History:  Procedure Laterality Date  . NO PAST SURGERIES      Allergies: Patient has no known allergies.  Medications: Prior to Admission medications   Medication Sig Start Date End Date Taking? Authorizing Provider  Ascorbic Acid (VITAMIN C) 100 MG tablet Take 100 mg by mouth daily.   Yes [provider]  Multiple Vitamin (MULTIVITAMIN WITH MINERALS) TABS tablet Take 1 tablet by mouth daily.   Yes  [provider]  vitamin B-12 (CYANOCOBALAMIN) 100 MCG tablet Take 100 mcg by mouth daily.   Yes [provider]  HYDROcodone-acetaminophen (NORCO/VICODIN) 5-325 MG tablet Take 1 tablet by mouth every 4 (four) hours as needed. Patient not taking: No sig reported 02/08/16   Nona Dell, PA-C  ibuprofen (ADVIL,MOTRIN) 600 MG tablet Take 1 tablet (600 mg total) by mouth every 6 (six) hours as needed. Patient not taking: No sig reported 02/08/16   Nona Dell, PA-C     History reviewed. No pertinent family history.  Social History   Socioeconomic History  . Marital status: Divorced    Spouse name: Not on file  . Number of children: Not on file  . Years of education: Not on file  . Highest education level: Not on file  Occupational History  . Not on file  Tobacco Use  . Smoking status: Never Smoker  . Smokeless tobacco: Never Used  Vaping Use  . Vaping Use: Never used  Substance and Sexual Activity  . Alcohol use: Yes    Comment: occasional beer  . Drug use: Not Currently  . Sexual activity: Not on file  Other Topics Concern  . Not on file  Social History Narrative  . Not on file   Social Determinants of Health   Financial Resource Strain: Not on file  Food Insecurity: Not on file  Transportation Needs: Not on file  Physical Activity: Not on file  Stress: Not on file  Social Connections: Not on file     Review of Systems: A 12 point ROS discussed and pertinent positives are indicated in the HPI above.  All other systems are negative.  Review of Systems  Constitutional: Positive for fatigue. Negative for chills and fever.  Respiratory: Negative for cough and shortness of breath.   Cardiovascular: Negative for chest pain.  Gastrointestinal: Negative for abdominal pain, nausea and vomiting.  Musculoskeletal: Negative for back pain.  Neurological: Negative for dizziness and headaches.    Vital Signs: BP 131/85 (BP Location:  Right Arm)   Pulse 83   Temp 98.1 F (36.7 C) (Oral)   Resp 16   Ht 5\' 5"  (1.651 m)   Wt 125 lb 14.4 oz (57.1 kg)   SpO2 100%   BMI 20.95 kg/m   Physical Exam Vitals and nursing note reviewed.  Constitutional:      General: He is not in acute distress. HENT:     Head: Normocephalic.     Mouth/Throat:     Mouth: Mucous membranes are moist.     Pharynx: Oropharynx is clear. No oropharyngeal exudate or posterior oropharyngeal erythema.  Eyes:     General: No scleral icterus. Cardiovascular:     Rate and Rhythm: Normal rate and regular rhythm.  Pulmonary:     Effort: Pulmonary effort is normal.     Breath sounds: Normal breath sounds.  Abdominal:     General: There is no distension.     Palpations: Abdomen is soft.     Tenderness: There is no abdominal tenderness.  Skin:    General: Skin is warm and dry.     Coloration: Skin is not jaundiced.     Comments: Multiple tattoos (+) small hematoma to left AC, non tender. Patient instructed to point this area out to RN next time he sees them.  Neurological:     Mental Status: He is alert and oriented to person, place, and time.  Psychiatric:        Mood and Affect: Mood normal.        Behavior: Behavior normal.        Thought Content: Thought content normal.        Judgment: Judgment normal.      MD Evaluation Airway: WNL Heart: WNL Abdomen: WNL Chest/ Lungs: WNL ASA  Classification: 3 Mallampati/Airway Score: One   Imaging: CT Abdomen Pelvis Wo Contrast  Result Date: 03/05/2021 CLINICAL DATA:  Chest pain since yesterday.  Weight loss.  Weakness. EXAM: CT CHEST, ABDOMEN AND PELVIS WITHOUT CONTRAST TECHNIQUE: Multidetector CT imaging of the chest, abdomen and pelvis was performed following the standard protocol without IV contrast. COMPARISON:  05/05/2007 abdominopelvic CT, report only. Chest radiograph of earlier today. FINDINGS: CT CHEST FINDINGS Cardiovascular: Aortic atherosclerosis. Tortuous thoracic aorta. Normal  heart size, without pericardial effusion. Left main coronary artery calcification. Distal right or left circumflex coronary calcification as well. Mediastinum/Nodes: No supraclavicular adenopathy. No mediastinal or definite hilar adenopathy, given limitations of unenhanced CT. Esophageal fluid level on 30/3. Lungs/Pleura: No pleural fluid. Right lower lobe calcified granuloma on 119/5. Probable subpleural lymph node in the superior segment right lower lobe at 6 mm on 65/5. Left upper lobe calcified granuloma on 36/5. Musculoskeletal: Lower thoracic and lower cervical spondylosis. CT ABDOMEN PELVIS FINDINGS Hepatobiliary: Dominant hepatic dome area of hypoattenuation measures 6.1 x 7.6 cm on 58/3. A separate posterior right hepatic lobe hypoattenuating lesion of 1.7 cm on 62/3. Normal gallbladder, without biliary ductal  dilatation. Pancreas: Normal, without mass or ductal dilatation. Spleen: Normal in size, without focal abnormality. Adrenals/Urinary Tract: Normal adrenal glands. Left renal collecting system calculi of maximally 6 mm in the upper pole. No right-sided renal calculi. No hydronephrosis. No bladder calculi. Stomach/Bowel: Normal stomach, without wall thickening. Normal colon, appendix, and terminal ileum. Normal small bowel. Vascular/Lymphatic: Aortic atherosclerosis. No abdominopelvic adenopathy. Reproductive: Trace bilateral hydroceles are likely physiologic. Normal prostate. Other: No significant free fluid. Musculoskeletal: S-shaped thoracolumbar spine curvature. Thoracolumbar spondylosis. IMPRESSION: 1. Right hepatic lobe masses which are suspicious for either metastatic disease or primary malignancy such as hepatocellular carcinoma. These are suboptimally evaluated on this noncontrast exam. Consider multidisciplinary oncology consultation with potential clinical strategies of tissue sampling versus PET to direct sampling. 2. No primary malignancy identified within the chest, abdomen, or pelvis,  given limitation of noncontrast exam. 3. Left nephrolithiasis. 4. Coronary artery atherosclerosis. Aortic Atherosclerosis (ICD10-I70.0). 5. Esophageal air fluid level suggests dysmotility or gastroesophageal reflux. Electronically Signed   By: Abigail Miyamoto M.D.   On: 03/05/2021 14:07   DG Chest 2 View  Result Date: 03/05/2021 CLINICAL DATA:  Intermittent chest pain since yesterday. EXAM: CHEST - 2 VIEW COMPARISON:  02/08/2016 FINDINGS: Heart size is normal. Minor aortic atherosclerotic calcification is noted. The pulmonary vascularity is normal. The lungs are clear. No effusions. No significant bone finding IMPRESSION: No active disease.  Minor aortic atherosclerotic calcification. Electronically Signed   By: Nelson Chimes M.D.   On: 03/05/2021 12:53   CT Chest Wo Contrast  Result Date: 03/05/2021 CLINICAL DATA:  Chest pain since yesterday.  Weight loss.  Weakness. EXAM: CT CHEST, ABDOMEN AND PELVIS WITHOUT CONTRAST TECHNIQUE: Multidetector CT imaging of the chest, abdomen and pelvis was performed following the standard protocol without IV contrast. COMPARISON:  05/05/2007 abdominopelvic CT, report only. Chest radiograph of earlier today. FINDINGS: CT CHEST FINDINGS Cardiovascular: Aortic atherosclerosis. Tortuous thoracic aorta. Normal heart size, without pericardial effusion. Left main coronary artery calcification. Distal right or left circumflex coronary calcification as well. Mediastinum/Nodes: No supraclavicular adenopathy. No mediastinal or definite hilar adenopathy, given limitations of unenhanced CT. Esophageal fluid level on 30/3. Lungs/Pleura: No pleural fluid. Right lower lobe calcified granuloma on 119/5. Probable subpleural lymph node in the superior segment right lower lobe at 6 mm on 65/5. Left upper lobe calcified granuloma on 36/5. Musculoskeletal: Lower thoracic and lower cervical spondylosis. CT ABDOMEN PELVIS FINDINGS Hepatobiliary: Dominant hepatic dome area of hypoattenuation measures  6.1 x 7.6 cm on 58/3. A separate posterior right hepatic lobe hypoattenuating lesion of 1.7 cm on 62/3. Normal gallbladder, without biliary ductal dilatation. Pancreas: Normal, without mass or ductal dilatation. Spleen: Normal in size, without focal abnormality. Adrenals/Urinary Tract: Normal adrenal glands. Left renal collecting system calculi of maximally 6 mm in the upper pole. No right-sided renal calculi. No hydronephrosis. No bladder calculi. Stomach/Bowel: Normal stomach, without wall thickening. Normal colon, appendix, and terminal ileum. Normal small bowel. Vascular/Lymphatic: Aortic atherosclerosis. No abdominopelvic adenopathy. Reproductive: Trace bilateral hydroceles are likely physiologic. Normal prostate. Other: No significant free fluid. Musculoskeletal: S-shaped thoracolumbar spine curvature. Thoracolumbar spondylosis. IMPRESSION: 1. Right hepatic lobe masses which are suspicious for either metastatic disease or primary malignancy such as hepatocellular carcinoma. These are suboptimally evaluated on this noncontrast exam. Consider multidisciplinary oncology consultation with potential clinical strategies of tissue sampling versus PET to direct sampling. 2. No primary malignancy identified within the chest, abdomen, or pelvis, given limitation of noncontrast exam. 3. Left nephrolithiasis. 4. Coronary artery atherosclerosis. Aortic Atherosclerosis (ICD10-I70.0). 5. Esophageal air  fluid level suggests dysmotility or gastroesophageal reflux. Electronically Signed   By: Abigail Miyamoto M.D.   On: 03/05/2021 14:07   MR LIVER W WO CONTRAST  Result Date: 03/06/2021 CLINICAL DATA:  Hepatic masses on recent CT. EXAM: MRI ABDOMEN WITHOUT AND WITH CONTRAST TECHNIQUE: Multiplanar multisequence MR imaging of the abdomen was performed both before and after the administration of intravenous contrast. CONTRAST:  64mL GADAVIST GADOBUTROL 1 MMOL/ML IV SOLN COMPARISON:  CT on 03/05/2021 FINDINGS: Lower chest: No acute  findings. Hepatobiliary: A heterogeneously enhancing hypovascular mass is seen which is centered in segment 8 in the liver dome, measuring 9.5 x 6.6 cm on image 23/16. A small mass is seen in segment 6/7 of the right lobe, which shows arterial phase hyperenhancement, contrast washout, and delayed peripheral rim enhancement. This measures 1.3 x 1.2 cm on image 33/24. A tiny sub-cm subcapsular cyst is seen in the posterior right hepatic lobe. No other liver masses are identified. No evidence of portal or hepatic vein thrombus. No definite gross morphologic findings of cirrhosis are noted. Gallbladder is unremarkable. No evidence of biliary ductal dilatation. Pancreas:  No mass or inflammatory changes. Spleen:  Within normal limits in size and appearance. Adrenals/Urinary Tract: No masses identified. Tiny sub-cm left renal cyst noted. No evidence of hydronephrosis. Stomach/Bowel: Visualized portion unremarkable. Vascular/Lymphatic: No pathologically enlarged lymph nodes identified. No abdominal aortic aneurysm. Other:  None. Musculoskeletal:  No suspicious bone lesions identified. IMPRESSION: 9.5 cm heterogeneous hypovascular mass in anterior right hepatic lobe, and 1.3 cm hypervascular mass in the posterior right hepatic lobe. Differential diagnosis includes liver metastases and multifocal hepatocellular carcinoma. Suggest correlation with serum AFP level, and possible tissue sampling. No evidence of abdominal metastatic disease. Electronically Signed   By: Marlaine Hind M.D.   On: 03/06/2021 16:08   CT L-SPINE NO CHARGE  Result Date: 03/05/2021 CLINICAL DATA:  Weight loss over the last month. Chest and abdominal pain. EXAM: CT LUMBAR SPINE WITHOUT CONTRAST TECHNIQUE: Multidetector CT imaging of the lumbar spine was performed without intravenous contrast administration. Multiplanar CT image reconstructions were also generated. 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 Alignment:  Thoracolumbar curvature convex to the left and lower lumbar curvature convex to the right. Vertebrae: No fracture or primary bone lesion. Discogenic sclerotic changes at the L1-2 level. Paraspinal and other soft tissues: See results of abdominal CT. Disc levels: T11-12: Mild disc bulge and facet osteoarthritis. No compressive stenosis. T12-L1: Disc bulge and facet osteoarthritis. Mild right foraminal narrowing, not likely significant. L1-2: Chronic disc degeneration with loss of disc height, endplate osteophytes and sclerotic change of the endplates. Facet degeneration and hypertrophy. Stenosis of both lateral recesses and foramina, right more than left. Neural compression could occur at this level, particularly on the right. L2-3: Endplate osteophytes and bulging of the disc. Facet and ligamentous hypertrophy. Mild stenosis of both lateral recesses and neural foramina that could possibly be significant. L3-4: Circumferential bulging of the disc. Facet and ligamentous hypertrophy. Severe multifactorial stenosis likely to cause neural compression. L4-5: Endplate osteophytes and bulging of the disc. Facet degeneration and hypertrophy. Stenosis of both lateral recesses and foramina. Neural compression could occur on either side. L5-S1: Transitional level. Sufficient patency of the canal foramina. 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 Correlation with this numbering scheme would be important should intervention be contemplated. 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 L4-5: Stenosis of both lateral recesses and neural foramina that would likely cause neural compression, particularly with respect to  the exiting L4 nerves. L2-3: Mild stenosis of the lateral recesses and neural foramina. L1-2: Lateral recess and foraminal stenosis right more than left. Neural compression could possibly occur, particularly on the right. Electronically Signed   By: Nelson Chimes M.D.   On: 03/05/2021  13:41   VAS [place] LOWER EXTREMITY VENOUS (DVT)  Result Date: 03/06/2021  Lower Venous DVT Study Patient Name:  Shawn Bailey  Date of Exam:   03/06/2021 Medical Rec #: [id number]            Accession #:    [id number] Date of Birth: 02-03-1946            Patient Gender: M Patient Age:   74Y Exam Location:  [facility] Procedure:      VAS [place] LOWER EXTREMITY VENOUS (DVT) Referring Phys: [id number] KELLY A GRIFFITH --------------------------------------------------------------------------------  Indications: Malignancy.  Comparison Study: No prior study Performing Technologist: Maudry Mayhew MHA, RDMS, RVT, RDCS  Examination Guidelines: A complete evaluation includes B-mode imaging, spectral Doppler, color Doppler, and power Doppler as needed of all accessible portions of each vessel. Bilateral testing is considered an integral part of a complete examination. Limited examinations for reoccurring indications may be performed as noted. The reflux portion of the exam is performed with the patient in reverse Trendelenburg.  +---------+---------------+---------+-----------+----------+--------------+ RIGHT    CompressibilityPhasicitySpontaneityPropertiesThrombus Aging +---------+---------------+---------+-----------+----------+--------------+ CFV      Full           Yes      Yes                                 +---------+---------------+---------+-----------+----------+--------------+ SFJ      Full                                                        +---------+---------------+---------+-----------+----------+--------------+ FV Prox  Full                                                        +---------+---------------+---------+-----------+----------+--------------+ FV Mid   Full                                                        +---------+---------------+---------+-----------+----------+--------------+ FV DistalFull                                                         +---------+---------------+---------+-----------+----------+--------------+ PFV      Full                                                        +---------+---------------+---------+-----------+----------+--------------+ POP  Full           Yes      Yes                                 +---------+---------------+---------+-----------+----------+--------------+ PTV      Full                                                        +---------+---------------+---------+-----------+----------+--------------+ PERO     Full                                                        +---------+---------------+---------+-----------+----------+--------------+   +---------+---------------+---------+-----------+----------+--------------+ LEFT     CompressibilityPhasicitySpontaneityPropertiesThrombus Aging +---------+---------------+---------+-----------+----------+--------------+ CFV      Full           Yes      Yes                                 +---------+---------------+---------+-----------+----------+--------------+ SFJ      Full                                                        +---------+---------------+---------+-----------+----------+--------------+ FV Prox  Full                                                        +---------+---------------+---------+-----------+----------+--------------+ FV Mid   Full                                                        +---------+---------------+---------+-----------+----------+--------------+ FV DistalFull                                                        +---------+---------------+---------+-----------+----------+--------------+ PFV      Full                                                        +---------+---------------+---------+-----------+----------+--------------+ POP      Full           Yes      Yes                                  +---------+---------------+---------+-----------+----------+--------------+  PTV      Full                                                        +---------+---------------+---------+-----------+----------+--------------+ PERO     Full                                                        +---------+---------------+---------+-----------+----------+--------------+     Summary: RIGHT: - There is no evidence of deep vein thrombosis in the lower extremity.  - No cystic structure found in the popliteal fossa.  LEFT: - There is no evidence of deep vein thrombosis in the lower extremity.  - No cystic structure found in the popliteal fossa.  *See table(s) above for measurements and observations. Electronically signed by Deitra Mayo MD on 03/06/2021 at 10:06:53 AM.    Final     Labs:  CBC: Recent Labs    03/05/21 1157  WBC 5.6  HGB 13.7  HCT 39.4  PLT 130*    COAGS: Recent Labs    03/05/21 1255  INR 1.1  APTT 29    BMP: Recent Labs    03/05/21 1157 03/06/21 0500 03/07/21 0738  NA 133* 135 135  K 3.7 3.9 3.9  CL 99 100 102  CO2 24 28 27   GLUCOSE 96 97 99  BUN 19 15 21   CALCIUM 9.7 9.4 9.8  CREATININE 0.78 0.63 0.70  GFRNONAA >60 >60 >60    LIVER FUNCTION TESTS: Recent Labs    03/05/21 1203 03/06/21 0500 03/07/21 0738  BILITOT 0.6 0.7 0.5  AST 112* 96* 111*  ALT 90* 81* 93*  ALKPHOS 60 48 53  PROT 8.2* 7.6 7.7  ALBUMIN 3.7 3.3* 3.4*    TUMOR MARKERS: No results for input(s): AFPTM, CEA, CA199, CHROMGRNA in the last 8760 hours.  Assessment and Plan:  75 y/o M who presented to [facility] ED on 5/20 with complaints of weakness, chest pain, poor appetite, weight loss and inability to care for himself at home alone. He was found to have right hepatic lobe masses suspicious for metastatic disease vs HCC. IR was consulted for biopsy and requested MRI liver w/contrast to further evaluate the masses, this was performed yesterday and reviewed by IR attending who  approves procedure.  Will tentatively plan for procedure tomorrow 5/23 pending [place] availability and emergent procedures. Patient to be NPO at midnight, hold Lovenox until post procedure, AM labs ordered. IR will call for patient when ready.  Risks and benefits of liver lesion biopsy was discussed with the patient and/or patient's family including, but not limited to bleeding, infection, damage to adjacent structures or low yield requiring additional tests.  All of the questions were answered and there is agreement to proceed.  Consent signed and in chart.  Thank you for this interesting consult.  I greatly enjoyed meeting Shawn Bailey and look forward to participating in their care.  A copy of this report was sent to the requesting provider on this date.  Electronically Signed: Joaquim Nam, PA-C 03/07/2021, 11:26 AM   I spent a total of 40 Minutes  in face to face in clinical consultation, greater than  50% of which was counseling/coordinating care for liver lesion biopsy.

## 2021-03-07 NOTE — Progress Notes (Signed)
PROGRESS NOTE    Shawn Bailey  ELF:[id number] DOB: October 04, 1946 DOA: 03/05/2021 PCP: Pcp, No   Brief Narrative: 75 year old male without any known past medical history mated with right-sided chest pain, generalized weakness lack of ability to perform ADLs.  Patient lives alone.  He also reports a 50 pound weight loss in the last 2 months.  His appetite is poor.  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  CTs were done without contrast.  No other findings of malignancy was noted.  Patient does not follow-up with a primary care physician on a regular basis.  He is requiring IV morphine for pain control.  Assessment & Plan:   Active Problems:   RUQ abdominal pain   Liver mass, right lobe   Pain   #1 Multiple liver masses right-sided chest/abdominal pain likely related to liver lesions.  He is requiring IV morphine for pain control. CT chest with multiple liver masses suspicious for primary hepatocellular carcinoma versus metastasis. Consult IR for biopsy. Check HIV and hepatitis panel. Check alpha-fetoprotein carcinoembryonic antigen CA 19-9 Discussed with Dr. Irene Limbo, he will see this patient as an outpatient follow-up after biopsy. Continue IV morphine and oxycodone. MRI liver 9.5 cm heterogeneous hypovascular mass in the anterior right hepatic lobe and 1.3 cm hypervascular mass in the posterior right hepatic lobe. Oncology work-up pending  #2 hypertension/tachycardia-likely from pain-he probably has undiagnosed hypertension.  Started him on metoprolol 12.5 twice a day.  #3 elevated LFTs follow levels likely from liver lesions no history of alcohol use  #4 mild hyponatremia resolved.    #5 hypoalbuminemia dietary consulted    Nutrition Problem: Predicted suboptimal nutrient intake Etiology: chronic illness (possible liver cancer)   Signs/Symptoms: per patient/family report    Interventions:  Ensure Enlive (each supplement provides 350kcal and 20 grams of protein),Magic cup,MVI  Estimated body mass index is 20.95 kg/m as calculated from the following:   Height as of this encounter: 5\' 5"  (1.651 m).   Weight as of this encounter: 57.1 kg.  DVT prophylaxis: Lovenox  code Status: Full code Family Communication: Called ex-wife unable to reach phone kept ringing Disposition Plan:  Status is:  inpatient   Dispo: The patient is from: Home              Anticipated d/c is to: Home              Patient currently is not medically stable to d/c.   Difficult to place patient No  Consultants: Discussed with oncology over the phone/chart message Procedures: None Antimicrobials: None Subjective: Patient resting in bed pain improved with morphine and oxycodone continues to have pain with increased  Objective: Vitals:   03/06/21 0546 03/06/21 1342 03/06/21 2030 03/07/21 0551  BP: (!) 157/94 133/76 129/72 131/85  Pulse: (!) 101 95 96 83  Resp: 18 18 18 16   Temp: 97.9 F (36.6 C) 98.4 F (36.9 C) 98.3 F (36.8 C) 98.1 F (36.7 C)  TempSrc: Oral Oral Oral Oral  SpO2: 99% 97% 97% 100%  Weight:      Height:        Intake/Output Summary (Last 24 hours) at 03/07/2021 1256 Last data filed at 03/07/2021 5852 Gross per 24 hour  Intake 600 ml  Output 0 ml  Net 600 ml   Filed Weights   03/05/21 1200  Weight: 57.1 kg    Examination:  General exam: Appears in  distress due to pain Respiratory system:  Clear to auscultation. Respiratory effort normal. Cardiovascular system: S1 & S2 heard, RRR. No JVD, murmurs, rubs, gallops or clicks. No pedal edema. Gastrointestinal system: Abdomen is scaphoid nondistended, soft and nontender. No organomegaly or masses felt. Normal bowel sounds heard. Central nervous system: Alert and oriented. No focal neurological deficits. Extremities: Symmetric 5 x 5 power. Skin: No rashes, lesions or ulcers Psychiatry: Judgement and insight appear normal.  Mood & affect appropriate.     Data Reviewed: I have personally reviewed following labs and imaging studies  CBC: Recent Labs  Lab 03/05/21 1157  WBC 5.6  HGB 13.7  HCT 39.4  MCV 93.4  PLT [id number]*   Basic Metabolic Panel: Recent Labs  Lab 03/05/21 1157 03/05/21 1255 03/06/21 0500 03/07/21 0738  NA 133*  --  135 135  K 3.7  --  3.9 3.9  CL 99  --  100 102  CO2 24  --  28 27  GLUCOSE 96  --  97 99  BUN 19  --  15 21  CREATININE 0.78  --  0.63 0.70  CALCIUM 9.7  --  9.4 9.8  MG  --  2.0  --   --    GFR: Estimated Creatinine Clearance: 64.4 mL/min (by C-G formula based on SCr of 0.7 mg/dL). Liver Function Tests: Recent Labs  Lab 03/05/21 1203 03/06/21 0500 03/07/21 0738  AST 112* 96* 111*  ALT 90* 81* 93*  ALKPHOS 60 48 53  BILITOT 0.6 0.7 0.5  PROT 8.2* 7.6 7.7  ALBUMIN 3.7 3.3* 3.4*   Recent Labs  Lab 03/05/21 1336  LIPASE 34   No results for input(s): AMMONIA in the last 168 hours. Coagulation Profile: Recent Labs  Lab 03/05/21 1255  INR 1.1   Cardiac Enzymes: No results for input(s): CKTOTAL, CKMB, CKMBINDEX, TROPONINI in the last 168 hours. BNP (last 3 results) No results for input(s): PROBNP in the last 8760 hours. HbA1C: Recent Labs    03/05/21 1259  HGBA1C 5.2   CBG: Recent Labs  Lab 03/05/21 1303  GLUCAP 91   Lipid Profile: No results for input(s): CHOL, HDL, LDLCALC, TRIG, CHOLHDL, LDLDIRECT in the last 72 hours. Thyroid Function Tests: Recent Labs    03/05/21 1245  TSH 2.535   Anemia Panel: No results for input(s): VITAMINB12, FOLATE, FERRITIN, TIBC, IRON, RETICCTPCT in the last 72 hours. Sepsis Labs: Recent Labs  Lab 03/05/21 1255  LATICACIDVEN 1.1    Recent Results (from the past 240 hour(s))  Urine culture     Status: None   Collection Time: 03/05/21  1:35 PM   Specimen: In/Out Cath Urine  Result Value Ref Range Status   Specimen Description   Final    IN/OUT CATH URINE Performed at [facility], [place] [address] [place], [place] [address]    Special Requests   Final    NONE Performed at [facility], [place] [address] [place], [place] [address]    Culture   Final    NO GROWTH Performed at [facility], [place] [address], [place], [place] [address]    Report Status 03/07/2021 FINAL  Final  Resp Panel by RT-PCR (Flu A&B, Covid) Nasopharyngeal Swab     Status: None   Collection Time: 03/05/21  4:22 PM   Specimen: Nasopharyngeal Swab; Nasopharyngeal(NP) swabs in vial transport medium  Result Value Ref Range Status   SARS Coronavirus 2 by RT PCR NEGATIVE NEGATIVE Final    Comment: (NOTE) SARS-CoV-2 target nucleic  acids are NOT DETECTED.  The SARS-CoV-2 RNA is generally detectable in upper respiratory specimens during the acute phase of infection. The lowest concentration of SARS-CoV-2 viral copies this assay can detect is 138 copies/mL. A negative result does not preclude SARS-Cov-2 infection and should not be used as the sole basis for treatment or other patient management decisions. A negative result may occur with  improper specimen collection/handling, submission of specimen other than nasopharyngeal swab, presence of viral mutation(s) within the areas targeted by this assay, and inadequate number of viral copies(<138 copies/mL). A negative result must be combined with clinical observations, patient history, and epidemiological information. The expected result is Negative.  Fact Sheet for Patients:  EntrepreneurPulse.com.au  Fact Sheet for Healthcare Providers:  IncredibleEmployment.be  This test is no t yet approved or cleared by the [place] FDA and  has been authorized for detection and/or diagnosis of SARS-CoV-2 by FDA under an Emergency Use Authorization (EUA). This EUA will remain  in effect (meaning this test can be used) for the duration of the COVID-19 declaration under Section 564(b)(1)  of the Act, 21 U.S.C.section 360bbb-3(b)(1), unless the authorization is terminated  or revoked sooner.       Influenza A by PCR NEGATIVE NEGATIVE Final   Influenza B by PCR NEGATIVE NEGATIVE Final    Comment: (NOTE) The Xpert Xpress SARS-CoV-2/FLU/RSV plus assay is intended as an aid in the diagnosis of influenza from Nasopharyngeal swab specimens and should not be used as a sole basis for treatment. Nasal washings and aspirates are unacceptable for Xpert Xpress SARS-CoV-2/FLU/RSV testing.  Fact Sheet for Patients: EntrepreneurPulse.com.au  Fact Sheet for Healthcare Providers: IncredibleEmployment.be  This test is not yet approved or cleared by the [place] FDA and has been authorized for detection and/or diagnosis of SARS-CoV-2 by FDA under an Emergency Use Authorization (EUA). This EUA will remain in effect (meaning this test can be used) for the duration of the COVID-19 declaration under Section 564(b)(1) of the Act, 21 U.S.C. section 360bbb-3(b)(1), unless the authorization is terminated or revoked.  Performed at [facility], [place] [address] [place], [place] [address]          Radiology Studies: CT Abdomen Pelvis Wo Contrast  Result Date: 03/05/2021 CLINICAL DATA:  Chest pain since yesterday.  Weight loss.  Weakness. EXAM: CT CHEST, ABDOMEN AND PELVIS WITHOUT CONTRAST TECHNIQUE: Multidetector CT imaging of the chest, abdomen and pelvis was performed following the standard protocol without IV contrast. COMPARISON:  05/05/2007 abdominopelvic CT, report only. Chest radiograph of earlier today. FINDINGS: CT CHEST FINDINGS Cardiovascular: Aortic atherosclerosis. Tortuous thoracic aorta. Normal heart size, without pericardial effusion. Left main coronary artery calcification. Distal right or left circumflex coronary calcification as well. Mediastinum/Nodes: No supraclavicular adenopathy. No mediastinal or definite  hilar adenopathy, given limitations of unenhanced CT. Esophageal fluid level on 30/3. Lungs/Pleura: No pleural fluid. Right lower lobe calcified granuloma on 119/5. Probable subpleural lymph node in the superior segment right lower lobe at 6 mm on 65/5. Left upper lobe calcified granuloma on 36/5. Musculoskeletal: Lower thoracic and lower cervical spondylosis. CT ABDOMEN PELVIS FINDINGS Hepatobiliary: Dominant hepatic dome area of hypoattenuation measures 6.1 x 7.6 cm on 58/3. A separate posterior right hepatic lobe hypoattenuating lesion of 1.7 cm on 62/3. Normal gallbladder, without biliary ductal dilatation. Pancreas: Normal, without mass or ductal dilatation. Spleen: Normal in size, without focal abnormality. Adrenals/Urinary Tract: Normal adrenal glands. Left renal collecting system calculi of maximally 6 mm in the upper pole. No right-sided renal  calculi. No hydronephrosis. No bladder calculi. Stomach/Bowel: Normal stomach, without wall thickening. Normal colon, appendix, and terminal ileum. Normal small bowel. Vascular/Lymphatic: Aortic atherosclerosis. No abdominopelvic adenopathy. Reproductive: Trace bilateral hydroceles are likely physiologic. Normal prostate. Other: No significant free fluid. Musculoskeletal: S-shaped thoracolumbar spine curvature. Thoracolumbar spondylosis. IMPRESSION: 1. Right hepatic lobe masses which are suspicious for either metastatic disease or primary malignancy such as hepatocellular carcinoma. These are suboptimally evaluated on this noncontrast exam. Consider multidisciplinary oncology consultation with potential clinical strategies of tissue sampling versus PET to direct sampling. 2. No primary malignancy identified within the chest, abdomen, or pelvis, given limitation of noncontrast exam. 3. Left nephrolithiasis. 4. Coronary artery atherosclerosis. Aortic Atherosclerosis (ICD10-I70.0). 5. Esophageal air fluid level suggests dysmotility or gastroesophageal reflux.  Electronically Signed   By: Abigail Miyamoto M.D.   On: 03/05/2021 14:07   CT Chest Wo Contrast  Result Date: 03/05/2021 CLINICAL DATA:  Chest pain since yesterday.  Weight loss.  Weakness. EXAM: CT CHEST, ABDOMEN AND PELVIS WITHOUT CONTRAST TECHNIQUE: Multidetector CT imaging of the chest, abdomen and pelvis was performed following the standard protocol without IV contrast. COMPARISON:  05/05/2007 abdominopelvic CT, report only. Chest radiograph of earlier today. FINDINGS: CT CHEST FINDINGS Cardiovascular: Aortic atherosclerosis. Tortuous thoracic aorta. Normal heart size, without pericardial effusion. Left main coronary artery calcification. Distal right or left circumflex coronary calcification as well. Mediastinum/Nodes: No supraclavicular adenopathy. No mediastinal or definite hilar adenopathy, given limitations of unenhanced CT. Esophageal fluid level on 30/3. Lungs/Pleura: No pleural fluid. Right lower lobe calcified granuloma on 119/5. Probable subpleural lymph node in the superior segment right lower lobe at 6 mm on 65/5. Left upper lobe calcified granuloma on 36/5. Musculoskeletal: Lower thoracic and lower cervical spondylosis. CT ABDOMEN PELVIS FINDINGS Hepatobiliary: Dominant hepatic dome area of hypoattenuation measures 6.1 x 7.6 cm on 58/3. A separate posterior right hepatic lobe hypoattenuating lesion of 1.7 cm on 62/3. Normal gallbladder, without biliary ductal dilatation. Pancreas: Normal, without mass or ductal dilatation. Spleen: Normal in size, without focal abnormality. Adrenals/Urinary Tract: Normal adrenal glands. Left renal collecting system calculi of maximally 6 mm in the upper pole. No right-sided renal calculi. No hydronephrosis. No bladder calculi. Stomach/Bowel: Normal stomach, without wall thickening. Normal colon, appendix, and terminal ileum. Normal small bowel. Vascular/Lymphatic: Aortic atherosclerosis. No abdominopelvic adenopathy. Reproductive: Trace bilateral hydroceles are  likely physiologic. Normal prostate. Other: No significant free fluid. Musculoskeletal: S-shaped thoracolumbar spine curvature. Thoracolumbar spondylosis. IMPRESSION: 1. Right hepatic lobe masses which are suspicious for either metastatic disease or primary malignancy such as hepatocellular carcinoma. These are suboptimally evaluated on this noncontrast exam. Consider multidisciplinary oncology consultation with potential clinical strategies of tissue sampling versus PET to direct sampling. 2. No primary malignancy identified within the chest, abdomen, or pelvis, given limitation of noncontrast exam. 3. Left nephrolithiasis. 4. Coronary artery atherosclerosis. Aortic Atherosclerosis (ICD10-I70.0). 5. Esophageal air fluid level suggests dysmotility or gastroesophageal reflux. Electronically Signed   By: Abigail Miyamoto M.D.   On: 03/05/2021 14:07   MR LIVER W WO CONTRAST  Result Date: 03/06/2021 CLINICAL DATA:  Hepatic masses on recent CT. EXAM: MRI ABDOMEN WITHOUT AND WITH CONTRAST TECHNIQUE: Multiplanar multisequence MR imaging of the abdomen was performed both before and after the administration of intravenous contrast. CONTRAST:  80mL GADAVIST GADOBUTROL 1 MMOL/ML IV SOLN COMPARISON:  CT on 03/05/2021 FINDINGS: Lower chest: No acute findings. Hepatobiliary: A heterogeneously enhancing hypovascular mass is seen which is centered in segment 8 in the liver dome, measuring 9.5 x 6.6 cm on image  23/16. A small mass is seen in segment 6/7 of the right lobe, which shows arterial phase hyperenhancement, contrast washout, and delayed peripheral rim enhancement. This measures 1.3 x 1.2 cm on image 33/24. A tiny sub-cm subcapsular cyst is seen in the posterior right hepatic lobe. No other liver masses are identified. No evidence of portal or hepatic vein thrombus. No definite gross morphologic findings of cirrhosis are noted. Gallbladder is unremarkable. No evidence of biliary ductal dilatation. Pancreas:  No mass or  inflammatory changes. Spleen:  Within normal limits in size and appearance. Adrenals/Urinary Tract: No masses identified. Tiny sub-cm left renal cyst noted. No evidence of hydronephrosis. Stomach/Bowel: Visualized portion unremarkable. Vascular/Lymphatic: No pathologically enlarged lymph nodes identified. No abdominal aortic aneurysm. Other:  None. Musculoskeletal:  No suspicious bone lesions identified. IMPRESSION: 9.5 cm heterogeneous hypovascular mass in anterior right hepatic lobe, and 1.3 cm hypervascular mass in the posterior right hepatic lobe. Differential diagnosis includes liver metastases and multifocal hepatocellular carcinoma. Suggest correlation with serum AFP level, and possible tissue sampling. No evidence of abdominal metastatic disease. Electronically Signed   By: Marlaine Hind M.D.   On: 03/06/2021 16:08   CT L-SPINE NO CHARGE  Result Date: 03/05/2021 CLINICAL DATA:  Weight loss over the last month. Chest and abdominal pain. EXAM: CT LUMBAR SPINE WITHOUT CONTRAST TECHNIQUE: Multidetector CT imaging of the lumbar spine was performed without intravenous contrast administration. Multiplanar CT image reconstructions were also generated. 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 Alignment: Thoracolumbar curvature convex to the left and lower lumbar curvature convex to the right. Vertebrae: No fracture or primary bone lesion. Discogenic sclerotic changes at the L1-2 level. Paraspinal and other soft tissues: See results of abdominal CT. Disc levels: T11-12: Mild disc bulge and facet osteoarthritis. No compressive stenosis. T12-L1: Disc bulge and facet osteoarthritis. Mild right foraminal narrowing, not likely significant. L1-2: Chronic disc degeneration with loss of disc height, endplate osteophytes and sclerotic change of the endplates. Facet degeneration and hypertrophy. Stenosis of both lateral recesses and foramina, right more than left. Neural compression could  occur at this level, particularly on the right. L2-3: Endplate osteophytes and bulging of the disc. Facet and ligamentous hypertrophy. Mild stenosis of both lateral recesses and neural foramina that could possibly be significant. L3-4: Circumferential bulging of the disc. Facet and ligamentous hypertrophy. Severe multifactorial stenosis likely to cause neural compression. L4-5: Endplate osteophytes and bulging of the disc. Facet degeneration and hypertrophy. Stenosis of both lateral recesses and foramina. Neural compression could occur on either side. L5-S1: Transitional level. Sufficient patency of the canal foramina. 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 Correlation with this numbering scheme would be important should intervention be contemplated. 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 L4-5: Stenosis of both lateral recesses and neural foramina that would likely cause neural compression, particularly with respect to the exiting L4 nerves. L2-3: Mild stenosis of the lateral recesses and neural foramina. L1-2: Lateral recess and foraminal stenosis right more than left. Neural compression could possibly occur, particularly on the right. Electronically Signed   By: Nelson Chimes M.D.   On: 03/05/2021 13:41   VAS [place] LOWER EXTREMITY VENOUS (DVT)  Result Date: 03/06/2021  Lower Venous DVT Study Patient Name:  TAVARRIS MCPARTLIN  Date of Exam:   03/06/2021 Medical Rec #: [id number]            Accession #:    [id number] Date of Birth: 05-15-1946  Patient Gender: M Patient Age:   75Y Exam Location:  [facility] Procedure:      VAS [place] LOWER EXTREMITY VENOUS (DVT) Referring Phys: [id number] KELLY A GRIFFITH --------------------------------------------------------------------------------  Indications: Malignancy.  Comparison Study: No prior study Performing Technologist: Maudry Mayhew MHA, RDMS, RVT, RDCS  Examination Guidelines: A complete evaluation includes  B-mode imaging, spectral Doppler, color Doppler, and power Doppler as needed of all accessible portions of each vessel. Bilateral testing is considered an integral part of a complete examination. Limited examinations for reoccurring indications may be performed as noted. The reflux portion of the exam is performed with the patient in reverse Trendelenburg.  +---------+---------------+---------+-----------+----------+--------------+ RIGHT    CompressibilityPhasicitySpontaneityPropertiesThrombus Aging +---------+---------------+---------+-----------+----------+--------------+ CFV      Full           Yes      Yes                                 +---------+---------------+---------+-----------+----------+--------------+ SFJ      Full                                                        +---------+---------------+---------+-----------+----------+--------------+ FV Prox  Full                                                        +---------+---------------+---------+-----------+----------+--------------+ FV Mid   Full                                                        +---------+---------------+---------+-----------+----------+--------------+ FV DistalFull                                                        +---------+---------------+---------+-----------+----------+--------------+ PFV      Full                                                        +---------+---------------+---------+-----------+----------+--------------+ POP      Full           Yes      Yes                                 +---------+---------------+---------+-----------+----------+--------------+ PTV      Full                                                        +---------+---------------+---------+-----------+----------+--------------+  PERO     Full                                                         +---------+---------------+---------+-----------+----------+--------------+   +---------+---------------+---------+-----------+----------+--------------+ LEFT     CompressibilityPhasicitySpontaneityPropertiesThrombus Aging +---------+---------------+---------+-----------+----------+--------------+ CFV      Full           Yes      Yes                                 +---------+---------------+---------+-----------+----------+--------------+ SFJ      Full                                                        +---------+---------------+---------+-----------+----------+--------------+ FV Prox  Full                                                        +---------+---------------+---------+-----------+----------+--------------+ FV Mid   Full                                                        +---------+---------------+---------+-----------+----------+--------------+ FV DistalFull                                                        +---------+---------------+---------+-----------+----------+--------------+ PFV      Full                                                        +---------+---------------+---------+-----------+----------+--------------+ POP      Full           Yes      Yes                                 +---------+---------------+---------+-----------+----------+--------------+ PTV      Full                                                        +---------+---------------+---------+-----------+----------+--------------+ PERO     Full                                                        +---------+---------------+---------+-----------+----------+--------------+  Summary: RIGHT: - There is no evidence of deep vein thrombosis in the lower extremity.  - No cystic structure found in the popliteal fossa.  LEFT: - There is no evidence of deep vein thrombosis in the lower extremity.  - No cystic structure found in the popliteal fossa.   *See table(s) above for measurements and observations. Electronically signed by Deitra Mayo MD on 03/06/2021 at 10:06:53 AM.    Final         Scheduled Meds: . ascorbic acid  500 mg Oral Daily  . enoxaparin (LOVENOX) injection  40 mg Subcutaneous Q24H  . feeding supplement  237 mL Oral BID BM  . fentaNYL  1 patch Transdermal Q72H  . multivitamin with minerals  1 tablet Oral Daily  . pneumococcal 23 valent vaccine  0.5 mL Intramuscular Tomorrow-1000  . senna  1 tablet Oral BID  . vitamin B-12  100 mcg Oral Daily   Continuous Infusions:   LOS: 1 day   Georgette Shell, MD  03/07/2021, 12:56 PM

## 2021-03-08 ENCOUNTER — Other Ambulatory Visit: Payer: Self-pay | Admitting: Oncology

## 2021-03-08 ENCOUNTER — Inpatient Hospital Stay (HOSPITAL_COMMUNITY): Payer: Medicare Other

## 2021-03-08 DIAGNOSIS — R52 Pain, unspecified: Secondary | ICD-10-CM | POA: Diagnosis not present

## 2021-03-08 DIAGNOSIS — R160 Hepatomegaly, not elsewhere classified: Secondary | ICD-10-CM

## 2021-03-08 DIAGNOSIS — C220 Liver cell carcinoma: Secondary | ICD-10-CM

## 2021-03-08 HISTORY — DX: Liver cell carcinoma: C22.0

## 2021-03-08 LAB — CBC
HCT: 45.4 % (ref 39.0–52.0)
Hemoglobin: 15.1 g/dL (ref 13.0–17.0)
MCH: 31.9 pg (ref 26.0–34.0)
MCHC: 33.3 g/dL (ref 30.0–36.0)
MCV: 96 fL (ref 80.0–100.0)
Platelets: 133 10*3/uL — ABNORMAL LOW (ref 150–400)
RBC: 4.73 MIL/uL (ref 4.22–5.81)
RDW: 12 % (ref 11.5–15.5)
WBC: 7.3 10*3/uL (ref 4.0–10.5)
nRBC: 0 % (ref 0.0–0.2)

## 2021-03-08 LAB — CEA: CEA: 2 ng/mL (ref 0.0–4.7)

## 2021-03-08 LAB — COMPREHENSIVE METABOLIC PANEL
ALT: 108 U/L — ABNORMAL HIGH (ref 0–44)
AST: 127 U/L — ABNORMAL HIGH (ref 15–41)
Albumin: 3.7 g/dL (ref 3.5–5.0)
Alkaline Phosphatase: 59 U/L (ref 38–126)
Anion gap: 6 (ref 5–15)
BUN: 23 mg/dL (ref 8–23)
CO2: 32 mmol/L (ref 22–32)
Calcium: 9.9 mg/dL (ref 8.9–10.3)
Chloride: 99 mmol/L (ref 98–111)
Creatinine, Ser: 1 mg/dL (ref 0.61–1.24)
GFR, Estimated: >60 mL/min (ref >60)
Glucose, Bld: 107 mg/dL — ABNORMAL HIGH (ref 70–99)
Potassium: 4.2 mmol/L (ref 3.5–5.1)
Sodium: 137 mmol/L (ref 135–145)
Total Bilirubin: 0.5 mg/dL (ref 0.3–1.2)
Total Protein: 8.7 g/dL — ABNORMAL HIGH (ref 6.5–8.1)

## 2021-03-08 LAB — AFP TUMOR MARKER: AFP, Serum, Tumor Marker: 1479 ng/mL — ABNORMAL HIGH (ref 0.0–8.4)

## 2021-03-08 LAB — PROTIME-INR
INR: 0.9 (ref 0.8–1.2)
Prothrombin Time: 12.6 seconds (ref 11.4–15.2)

## 2021-03-08 LAB — CANCER ANTIGEN 19-9: CA 19-9: 50 U/mL — ABNORMAL HIGH (ref 0–35)

## 2021-03-08 IMAGING — US US BIOPSY CORE LIVER
1 series · 15 of 25 positions shown · non-contrast
Comparison: none

INDICATION: Large central anterior liver mass, concern for metastatic disease or
hepatocellular carcinoma

[Series 1: us biopsy mc & wl · 15 of 28 slices shown]
[im 1/28]
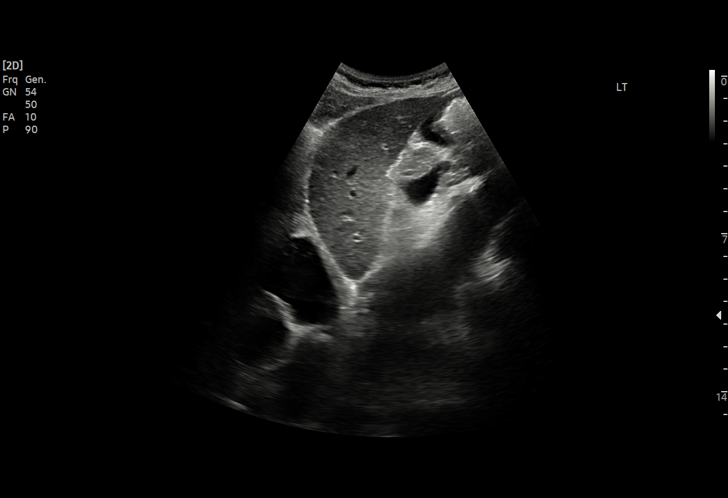
[im 3/28]
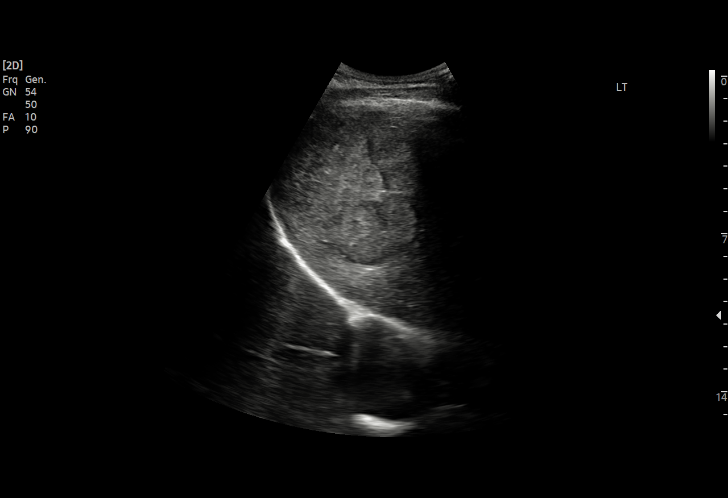
[im 5/28]
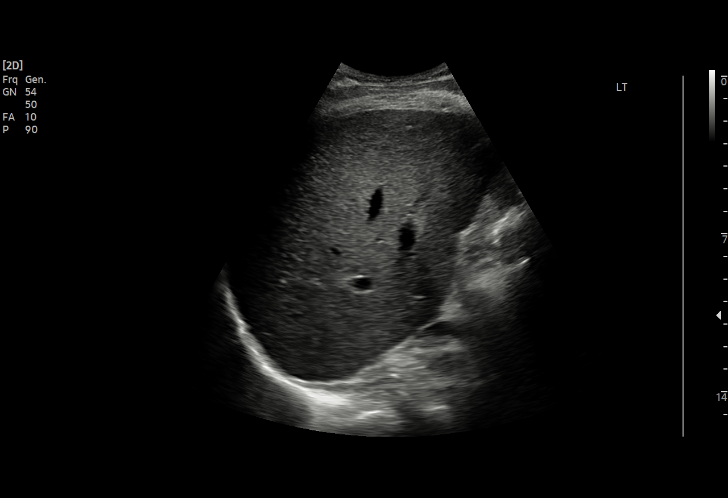
[im 6/28]
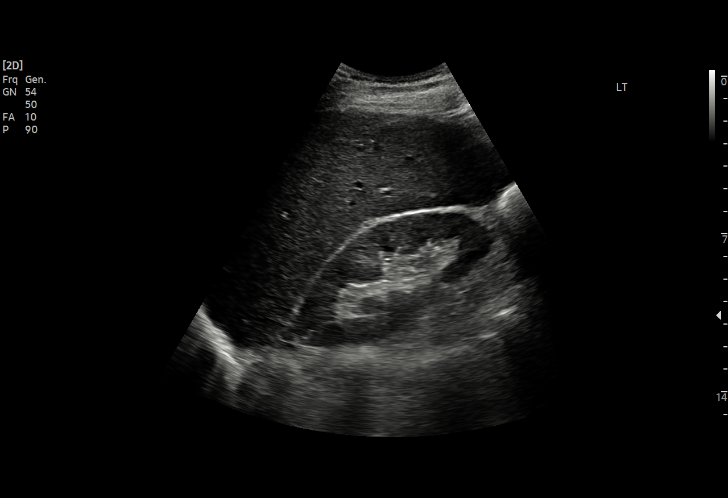
[im 8/28]
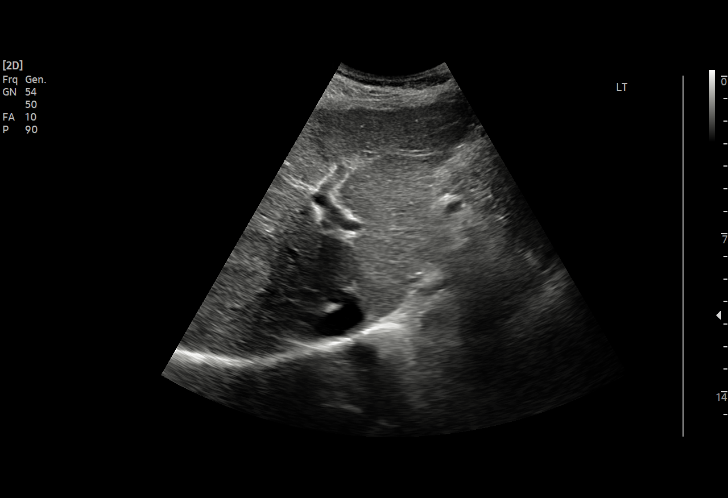
[im 11/28]
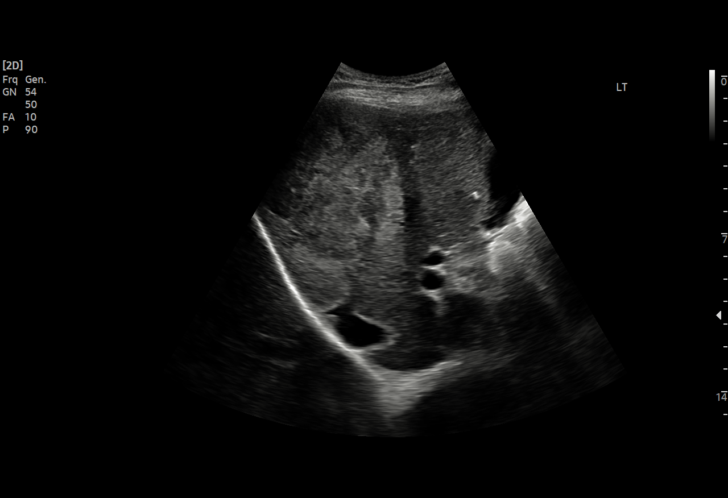
[im 12/28]
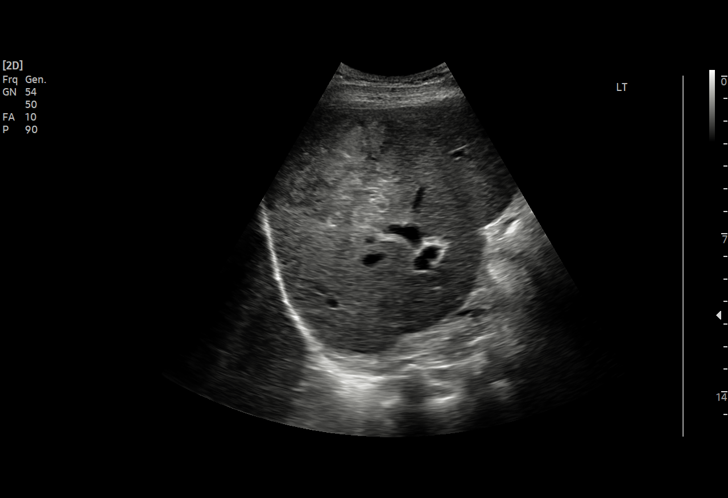
[im 14/28]
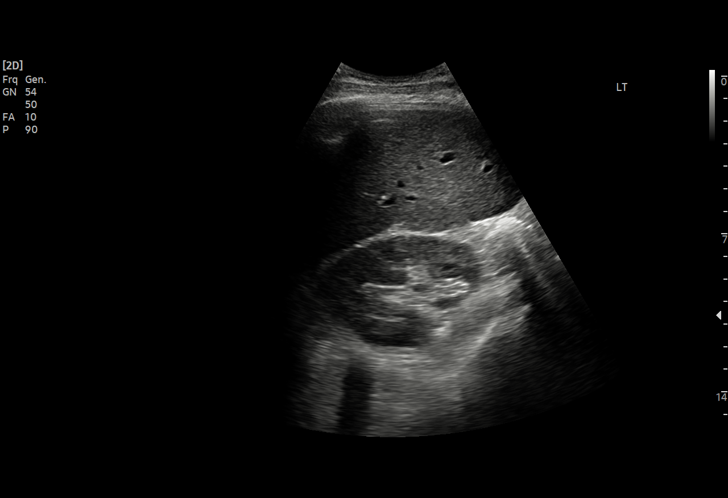
[im 16/28]
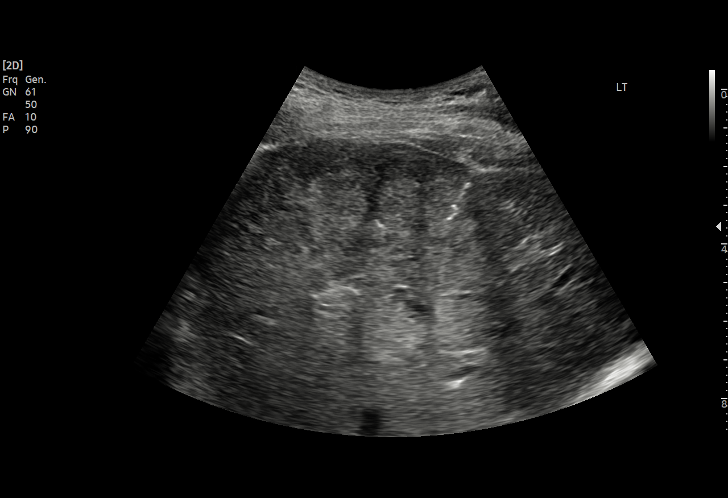
[im 17/28]
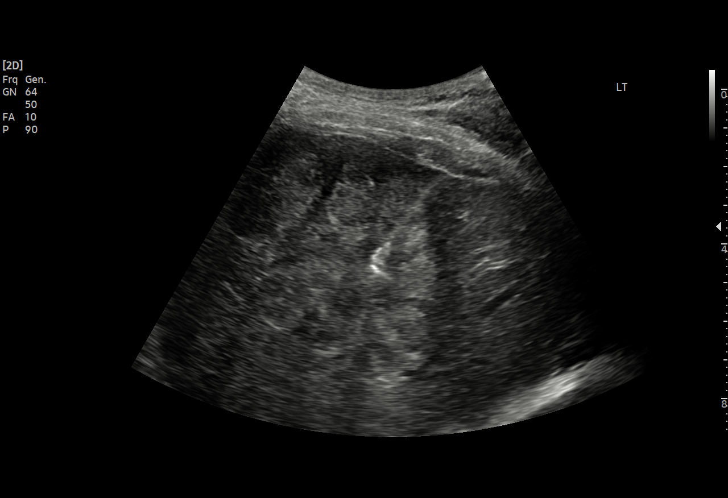
[im 20/28]
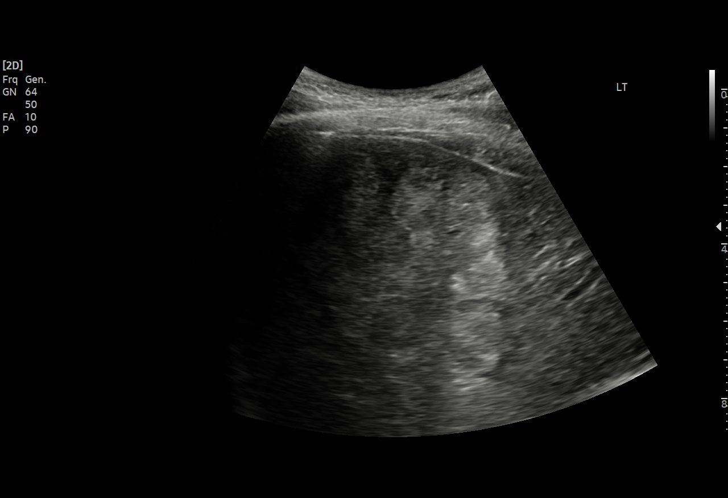
[im 22/28]
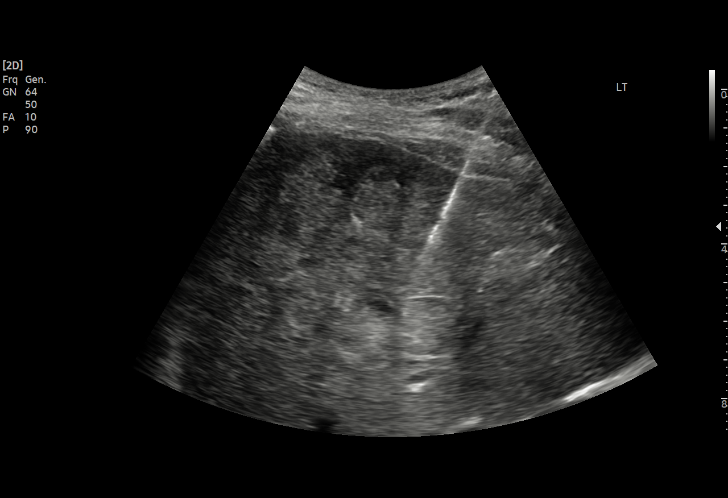
[im 23/28]
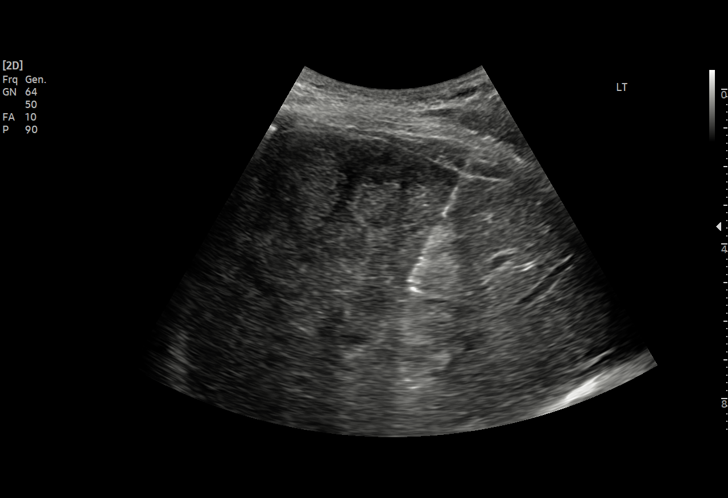
[im 25/28]
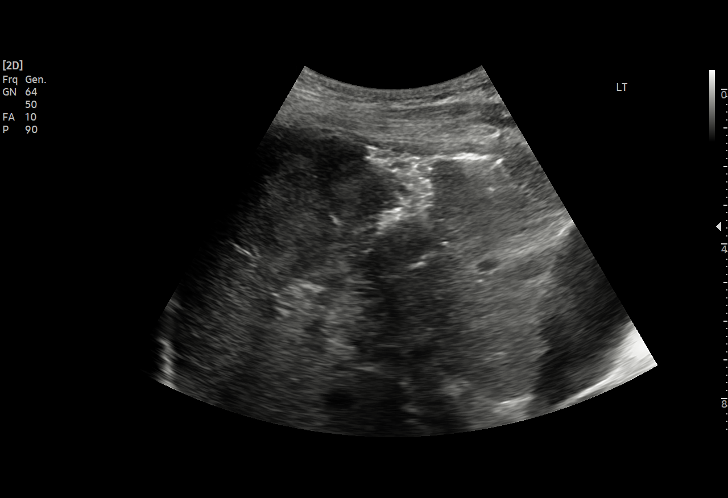
[im 28/28]
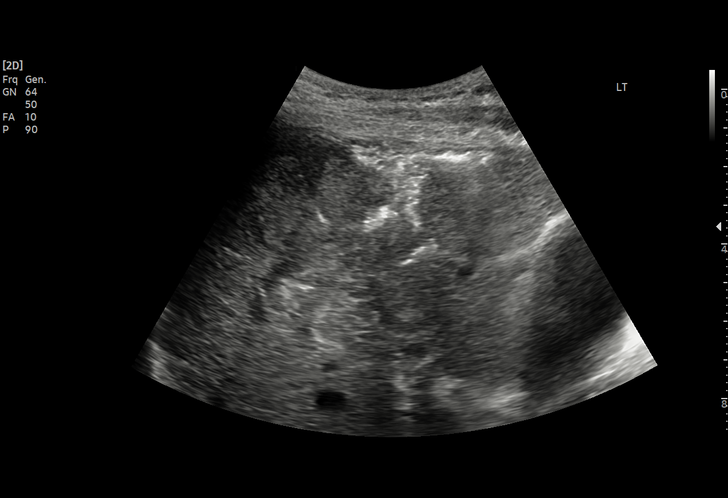

[15 of 25 positions shown; findings below may reference images not displayed]

EXAM:
ULTRASOUND RIGHT LIVER MASS 18 GAUGE CORE BIOPSY

MEDICATIONS:
1% LIDOCAINE LOCAL

ANESTHESIA/SEDATION:
Moderate (conscious) sedation was employed during this procedure. A
total of Versed 2.0 mg and Fentanyl 0 100 mcg was administered
intravenously.

Moderate Sedation Time: 10 minutes. The patient's level of
consciousness and vital signs were monitored continuously by
radiology nursing throughout the procedure under my direct
supervision.

FLUOROSCOPY TIME:  Fluoroscopy Time: NONE.

COMPLICATIONS:
None immediate.

PROCEDURE:
Informed written consent was obtained from the patient after a
thorough discussion of the procedural risks, benefits and
alternatives. All questions were addressed. Maximal Sterile Barrier
Technique was utilized including caps, mask, sterile gowns, sterile
gloves, sterile drape, hand hygiene and skin antiseptic. A timeout
was performed prior to the initiation of the procedure.

previous imaging reviewed. preliminary ultrasound performed. the
anterior right liver lesion was localized and marked. this was
correlated with the prior mri and ct.

under sterile conditions and local anesthesia, a 17 gauge coaxial
guide was advanced to the hyperechoic right liver lesion. needle
position confirmed with ultrasound. images obtained for
documentation. 18 gauge core biopsies obtained. samples were intact
and non fragmented. these were placed in formalin. needle tract
occluded with gel-foam. postprocedure imaging demonstrates no
hemorrhage or hematoma. patient tolerated biopsy well.
IMPRESSION: Successful ultrasound right liver mass 18 gauge core biopsies

## 2021-03-08 MED ORDER — GELATIN ABSORBABLE 12-7 MM EX MISC
CUTANEOUS | Status: AC
  Filled 2021-03-08: qty 1

## 2021-03-08 MED ORDER — FENTANYL CITRATE (PF) 100 MCG/2ML IJ SOLN
INTRAMUSCULAR | Status: AC | PRN
  Administered 2021-03-08 (×2): 50 ug via INTRAVENOUS

## 2021-03-08 MED ORDER — POLYETHYLENE GLYCOL 3350 17 G PO PACK: 17.0000 g | PACK | Freq: Every day | ORAL | 0 refills | Status: DC | PRN

## 2021-03-08 MED ORDER — LIDOCAINE HCL (PF) 1 % IJ SOLN
INTRAMUSCULAR | Status: AC | PRN
  Administered 2021-03-08: 10 mL

## 2021-03-08 MED ORDER — MIDAZOLAM HCL 2 MG/2ML IJ SOLN
INTRAMUSCULAR | Status: AC | PRN
  Administered 2021-03-08 (×2): 1 mg via INTRAVENOUS

## 2021-03-08 MED ORDER — MIDAZOLAM HCL 2 MG/2ML IJ SOLN
INTRAMUSCULAR | Status: AC
  Filled 2021-03-08: qty 4

## 2021-03-08 MED ORDER — FENTANYL CITRATE (PF) 100 MCG/2ML IJ SOLN
INTRAMUSCULAR | Status: AC
  Filled 2021-03-08: qty 2

## 2021-03-08 MED ORDER — SENNA 8.6 MG PO TABS: 1.0000 | ORAL_TABLET | Freq: Two times a day (BID) | ORAL | 0 refills | Status: DC

## 2021-03-08 MED ORDER — LIDOCAINE HCL 1 % IJ SOLN
INTRAMUSCULAR | Status: AC
  Filled 2021-03-08: qty 20

## 2021-03-08 NOTE — Plan of Care (Signed)
  Problem: Education: Goal: Knowledge of General Education information will improve Description: Including pain rating scale, medication(s)/side effects and non-pharmacologic comfort measures 03/08/2021 1520 by Charlyne Petrin, RN Outcome: Adequate for Discharge 03/08/2021 1519 by Charlyne Petrin, RN Outcome: Adequate for Discharge   Problem: Health Behavior/Discharge Planning: Goal: Ability to manage health-related needs will improve 03/08/2021 1520 by Charlyne Petrin, RN Outcome: Adequate for Discharge 03/08/2021 1519 by Charlyne Petrin, RN Outcome: Adequate for Discharge   Problem: Clinical Measurements: Goal: Ability to maintain clinical measurements within normal limits will improve 03/08/2021 1520 by Charlyne Petrin, RN Outcome: Adequate for Discharge 03/08/2021 1519 by Charlyne Petrin, RN Outcome: Adequate for Discharge Goal: Will remain free from infection 03/08/2021 1520 by Charlyne Petrin, RN Outcome: Adequate for Discharge 03/08/2021 1519 by Charlyne Petrin, RN Outcome: Adequate for Discharge Goal: Diagnostic test results will improve 03/08/2021 1520 by Charlyne Petrin, RN Outcome: Adequate for Discharge 03/08/2021 1519 by Charlyne Petrin, RN Outcome: Adequate for Discharge Goal: Respiratory complications will improve 03/08/2021 1520 by Charlyne Petrin, RN Outcome: Adequate for Discharge 03/08/2021 1519 by Charlyne Petrin, RN Outcome: Adequate for Discharge Goal: Cardiovascular complication will be avoided 03/08/2021 1520 by Charlyne Petrin, RN Outcome: Adequate for Discharge 03/08/2021 1519 by Charlyne Petrin, RN Outcome: Adequate for Discharge   Problem: Activity: Goal: Risk for activity intolerance will decrease 03/08/2021 1520 by Charlyne Petrin, RN Outcome: Adequate for Discharge 03/08/2021 1519 by Charlyne Petrin, RN Outcome: Adequate for Discharge   Problem: Nutrition: Goal: Adequate nutrition will be maintained 03/08/2021 1520 by Charlyne Petrin, RN Outcome: Adequate for Discharge 03/08/2021 1519 by Charlyne Petrin,  RN Outcome: Adequate for Discharge   Problem: Coping: Goal: Level of anxiety will decrease 03/08/2021 1520 by Charlyne Petrin, RN Outcome: Adequate for Discharge 03/08/2021 1519 by Charlyne Petrin, RN Outcome: Adequate for Discharge   Problem: Elimination: Goal: Will not experience complications related to bowel motility 03/08/2021 1520 by Charlyne Petrin, RN Outcome: Adequate for Discharge 03/08/2021 1519 by Charlyne Petrin, RN Outcome: Adequate for Discharge Goal: Will not experience complications related to urinary retention 03/08/2021 1520 by Charlyne Petrin, RN Outcome: Adequate for Discharge 03/08/2021 1519 by Charlyne Petrin, RN Outcome: Adequate for Discharge   Problem: Pain Managment: Goal: General experience of comfort will improve 03/08/2021 1520 by Charlyne Petrin, RN Outcome: Adequate for Discharge 03/08/2021 1519 by Charlyne Petrin, RN Outcome: Adequate for Discharge   Problem: Safety: Goal: Ability to remain free from injury will improve 03/08/2021 1520 by Charlyne Petrin, RN Outcome: Adequate for Discharge 03/08/2021 1519 by Charlyne Petrin, RN Outcome: Adequate for Discharge   Problem: Skin Integrity: Goal: Risk for impaired skin integrity will decrease 03/08/2021 1520 by Charlyne Petrin, RN Outcome: Adequate for Discharge 03/08/2021 1519 by Charlyne Petrin, RN Outcome: Adequate for Discharge

## 2021-03-08 NOTE — Progress Notes (Signed)
Brief oncology note:  We were made aware of request for consult of this patient.  He is having a liver biopsy today and then will likely discharge to home.  Outpatient follow-up has been scheduled at the cancer center on 03/11/2021.

## 2021-03-08 NOTE — Procedures (Signed)
Interventional Radiology Procedure Note  Procedure: RT LIVER MASS BX    Complications: None  Estimated Blood Loss:  MIN  Findings: 18 G CORE BX X 3 PATH PENDING    M. Daryll Brod, MD

## 2021-03-08 NOTE — Progress Notes (Signed)
Shawn Bailey gives permission to discuss his medical condition with his son Raelyn Mora, contact information for his son is [phone].

## 2021-03-08 NOTE — Discharge Summary (Signed)
Physician Discharge Summary  Shawn Bailey [id number] DOB: [birth year] DOA: 03/05/2021  PCP: Merryl Hacker, No  Admit date: 03/05/2021 Discharge date: 03/08/2021  Admitted From: Home Disposition: Home Recommendations for Outpatient Follow-up:  1. Follow up with PCP in 1-2 weeks 2. Please obtain BMP/CBC in one week 3    Please follow up with Dr. Burr Medico on 03/11/2021 appointment has been made prior to discharge. Home Health: None  equipment/Devices: None Discharge Condition stable  CODE STATUS full code Diet recommendation: Cardiac diet Brief/Interim Summary:75 year old male without any known past medical history mated with right-sided chest pain, generalized weakness lack of ability to perform ADLs.  Patient lives alone.  He also reports a 50 pound weight loss in the last 2 months.  His appetite is poor.  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  CTs were done without contrast.  No other findings of malignancy was noted.  Patient does not follow-up with a primary care physician on a regular basis.   Discharge Diagnoses:  Active Problems:   RUQ abdominal pain   Liver mass, right lobe   Pain     #1 Multiple liver masses -patient was admitted with right-sided chest and abdominal pain this was likely thought to be related to liver lesions. CT chest with multiple liver masses suspicious for primary hepatocellular carcinoma versus metastasis. 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 Discussed with Dr. Irene Limbo, he will see this patient as an outpatient follow-up after biopsy. MRI liver 9.5 cm heterogeneous hypovascular mass in the anterior right hepatic lobe and 1.3 cm hypervascular mass in the posterior right hepatic lobe.  #2 hypertension/tachycardia-likely from pain-resolved.  #3 elevated LFTs need to monitor as  outpatient  #4 mild hyponatremia resolved.    Nutrition Problem: Predicted suboptimal nutrient intake Etiology: chronic illness (possible liver cancer)  Signs/Symptoms: per patient/family report  Interventions: Ensure Enlive (each supplement provides 350kcal and 20 grams of protein),Magic cup,MVI  Estimated body mass index is 20.95 kg/m as calculated from the following:   Height as of this encounter: 5\' 5"  (1.651 m).   Weight as of this encounter: 57.1 kg.  Discharge Instructions  Discharge Instructions    Diet - low sodium heart healthy   Complete by: As directed    Increase activity slowly   Complete by: As directed    No wound care   Complete by: As directed      Allergies as of 03/08/2021   No Known Allergies     Medication List    STOP taking these medications   HYDROcodone-acetaminophen 5-325 MG tablet Commonly known as: NORCO/VICODIN   ibuprofen 600 MG tablet Commonly known as: ADVIL     TAKE these medications   multivitamin with minerals Tabs tablet Take 1 tablet by mouth daily.   polyethylene glycol 17 g packet Commonly known as: MIRALAX / GLYCOLAX Take 17 g by mouth daily as needed for mild constipation.   senna 8.6 MG Tabs tablet Commonly known as: SENOKOT Take 1 tablet (8.6 mg total) by mouth 2 (two) times daily.   vitamin B-12 100 MCG tablet Commonly known as: CYANOCOBALAMIN Take 100 mcg by mouth daily.   vitamin C 100 MG tablet Take 100 mg by mouth daily.            Durable Medical Equipment  (From admission, onward)  Start     Ordered   03/06/21 1651  For home use only DME Shower stool  Once        03/06/21 1650          Follow-up Information    [facility] Follow up.   Why: shower stool Contact information: [address] [place] [address] [phone]        [facility] Follow up.   Specialty: Home Health Services Why: PT Contact information: [place]  [address] [phone]        [place] DSS. Call.   Why: Call to start a Medicaid application Contact information: [place], [place] [address] [phone]             No Known Allergies  Consultations:oncology   Procedures/Studies: CT Abdomen Pelvis Wo Contrast  Result Date: 03/05/2021 CLINICAL DATA:  Chest pain since yesterday.  Weight loss.  Weakness. EXAM: CT CHEST, ABDOMEN AND PELVIS WITHOUT CONTRAST TECHNIQUE: Multidetector CT imaging of the chest, abdomen and pelvis was performed following the standard protocol without IV contrast. COMPARISON:  05/05/2007 abdominopelvic CT, report only. Chest radiograph of earlier today. FINDINGS: CT CHEST FINDINGS Cardiovascular: Aortic atherosclerosis. Tortuous thoracic aorta. Normal heart size, without pericardial effusion. Left main coronary artery calcification. Distal right or left circumflex coronary calcification as well. Mediastinum/Nodes: No supraclavicular adenopathy. No mediastinal or definite hilar adenopathy, given limitations of unenhanced CT. Esophageal fluid level on 30/3. Lungs/Pleura: No pleural fluid. Right lower lobe calcified granuloma on 119/5. Probable subpleural lymph node in the superior segment right lower lobe at 6 mm on 65/5. Left upper lobe calcified granuloma on 36/5. Musculoskeletal: Lower thoracic and lower cervical spondylosis. CT ABDOMEN PELVIS FINDINGS Hepatobiliary: Dominant hepatic dome area of hypoattenuation measures 6.1 x 7.6 cm on 58/3. A separate posterior right hepatic lobe hypoattenuating lesion of 1.7 cm on 62/3. Normal gallbladder, without biliary ductal dilatation. Pancreas: Normal, without mass or ductal dilatation. Spleen: Normal in size, without focal abnormality. Adrenals/Urinary Tract: Normal adrenal glands. Left renal collecting system calculi of maximally 6 mm in the upper pole. No right-sided renal calculi. No hydronephrosis. No bladder calculi. Stomach/Bowel: Normal stomach,  without wall thickening. Normal colon, appendix, and terminal ileum. Normal small bowel. Vascular/Lymphatic: Aortic atherosclerosis. No abdominopelvic adenopathy. Reproductive: Trace bilateral hydroceles are likely physiologic. Normal prostate. Other: No significant free fluid. Musculoskeletal: S-shaped thoracolumbar spine curvature. Thoracolumbar spondylosis. IMPRESSION: 1. Right hepatic lobe masses which are suspicious for either metastatic disease or primary malignancy such as hepatocellular carcinoma. These are suboptimally evaluated on this noncontrast exam. Consider multidisciplinary oncology consultation with potential clinical strategies of tissue sampling versus PET to direct sampling. 2. No primary malignancy identified within the chest, abdomen, or pelvis, given limitation of noncontrast exam. 3. Left nephrolithiasis. 4. Coronary artery atherosclerosis. Aortic Atherosclerosis (ICD10-I70.0). 5. Esophageal air fluid level suggests dysmotility or gastroesophageal reflux. Electronically Signed   By: Abigail Miyamoto M.D.   On: 03/05/2021 14:07   DG Chest 2 View  Result Date: 03/05/2021 CLINICAL DATA:  Intermittent chest pain since yesterday. EXAM: CHEST - 2 VIEW COMPARISON:  02/08/2016 FINDINGS: Heart size is normal. Minor aortic atherosclerotic calcification is noted. The pulmonary vascularity is normal. The lungs are clear. No effusions. No significant bone finding IMPRESSION: No active disease.  Minor aortic atherosclerotic calcification. Electronically Signed   By: Nelson Chimes M.D.   On: 03/05/2021 12:53   CT Chest Wo Contrast  Result Date: 03/05/2021 CLINICAL DATA:  Chest pain  since yesterday.  Weight loss.  Weakness. EXAM: CT CHEST, ABDOMEN AND PELVIS WITHOUT CONTRAST TECHNIQUE: Multidetector CT imaging of the chest, abdomen and pelvis was performed following the standard protocol without IV contrast. COMPARISON:  05/05/2007 abdominopelvic CT, report only. Chest radiograph of earlier today.  FINDINGS: CT CHEST FINDINGS Cardiovascular: Aortic atherosclerosis. Tortuous thoracic aorta. Normal heart size, without pericardial effusion. Left main coronary artery calcification. Distal right or left circumflex coronary calcification as well. Mediastinum/Nodes: No supraclavicular adenopathy. No mediastinal or definite hilar adenopathy, given limitations of unenhanced CT. Esophageal fluid level on 30/3. Lungs/Pleura: No pleural fluid. Right lower lobe calcified granuloma on 119/5. Probable subpleural lymph node in the superior segment right lower lobe at 6 mm on 65/5. Left upper lobe calcified granuloma on 36/5. Musculoskeletal: Lower thoracic and lower cervical spondylosis. CT ABDOMEN PELVIS FINDINGS Hepatobiliary: Dominant hepatic dome area of hypoattenuation measures 6.1 x 7.6 cm on 58/3. A separate posterior right hepatic lobe hypoattenuating lesion of 1.7 cm on 62/3. Normal gallbladder, without biliary ductal dilatation. Pancreas: Normal, without mass or ductal dilatation. Spleen: Normal in size, without focal abnormality. Adrenals/Urinary Tract: Normal adrenal glands. Left renal collecting system calculi of maximally 6 mm in the upper pole. No right-sided renal calculi. No hydronephrosis. No bladder calculi. Stomach/Bowel: Normal stomach, without wall thickening. Normal colon, appendix, and terminal ileum. Normal small bowel. Vascular/Lymphatic: Aortic atherosclerosis. No abdominopelvic adenopathy. Reproductive: Trace bilateral hydroceles are likely physiologic. Normal prostate. Other: No significant free fluid. Musculoskeletal: S-shaped thoracolumbar spine curvature. Thoracolumbar spondylosis. IMPRESSION: 1. Right hepatic lobe masses which are suspicious for either metastatic disease or primary malignancy such as hepatocellular carcinoma. These are suboptimally evaluated on this noncontrast exam. Consider multidisciplinary oncology consultation with potential clinical strategies of tissue sampling versus  PET to direct sampling. 2. No primary malignancy identified within the chest, abdomen, or pelvis, given limitation of noncontrast exam. 3. Left nephrolithiasis. 4. Coronary artery atherosclerosis. Aortic Atherosclerosis (ICD10-I70.0). 5. Esophageal air fluid level suggests dysmotility or gastroesophageal reflux. Electronically Signed   By: Jeronimo Greaves M.D.   On: 03/05/2021 14:07   MR LIVER W WO CONTRAST  Result Date: 03/06/2021 CLINICAL DATA:  Hepatic masses on recent CT. EXAM: MRI ABDOMEN WITHOUT AND WITH CONTRAST TECHNIQUE: Multiplanar multisequence MR imaging of the abdomen was performed both before and after the administration of intravenous contrast. CONTRAST:  80mL GADAVIST GADOBUTROL 1 MMOL/ML IV SOLN COMPARISON:  CT on 03/05/2021 FINDINGS: Lower chest: No acute findings. Hepatobiliary: A heterogeneously enhancing hypovascular mass is seen which is centered in segment 8 in the liver dome, measuring 9.5 x 6.6 cm on image 23/16. A small mass is seen in segment 6/7 of the right lobe, which shows arterial phase hyperenhancement, contrast washout, and delayed peripheral rim enhancement. This measures 1.3 x 1.2 cm on image 33/24. A tiny sub-cm subcapsular cyst is seen in the posterior right hepatic lobe. No other liver masses are identified. No evidence of portal or hepatic vein thrombus. No definite gross morphologic findings of cirrhosis are noted. Gallbladder is unremarkable. No evidence of biliary ductal dilatation. Pancreas:  No mass or inflammatory changes. Spleen:  Within normal limits in size and appearance. Adrenals/Urinary Tract: No masses identified. Tiny sub-cm left renal cyst noted. No evidence of hydronephrosis. Stomach/Bowel: Visualized portion unremarkable. Vascular/Lymphatic: No pathologically enlarged lymph nodes identified. No abdominal aortic aneurysm. Other:  None. Musculoskeletal:  No suspicious bone lesions identified. IMPRESSION: 9.5 cm heterogeneous hypovascular mass in anterior right  hepatic lobe, and 1.3 cm hypervascular mass in the posterior right hepatic  lobe. Differential diagnosis includes liver metastases and multifocal hepatocellular carcinoma. Suggest correlation with serum AFP level, and possible tissue sampling. No evidence of abdominal metastatic disease. Electronically Signed   By: Marlaine Hind M.D.   On: 03/06/2021 16:08   CT L-SPINE NO CHARGE  Result Date: 03/05/2021 CLINICAL DATA:  Weight loss over the last month. Chest and abdominal pain. EXAM: CT LUMBAR SPINE WITHOUT CONTRAST TECHNIQUE: Multidetector CT imaging of the lumbar spine was performed without intravenous contrast administration. Multiplanar CT image reconstructions were also generated. 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 Alignment: Thoracolumbar curvature convex to the left and lower lumbar curvature convex to the right. Vertebrae: No fracture or primary bone lesion. Discogenic sclerotic changes at the L1-2 level. Paraspinal and other soft tissues: See results of abdominal CT. Disc levels: T11-12: Mild disc bulge and facet osteoarthritis. No compressive stenosis. T12-L1: Disc bulge and facet osteoarthritis. Mild right foraminal narrowing, not likely significant. L1-2: Chronic disc degeneration with loss of disc height, endplate osteophytes and sclerotic change of the endplates. Facet degeneration and hypertrophy. Stenosis of both lateral recesses and foramina, right more than left. Neural compression could occur at this level, particularly on the right. L2-3: Endplate osteophytes and bulging of the disc. Facet and ligamentous hypertrophy. Mild stenosis of both lateral recesses and neural foramina that could possibly be significant. L3-4: Circumferential bulging of the disc. Facet and ligamentous hypertrophy. Severe multifactorial stenosis likely to cause neural compression. L4-5: Endplate osteophytes and bulging of the disc. Facet degeneration and hypertrophy. Stenosis of  both lateral recesses and foramina. Neural compression could occur on either side. L5-S1: Transitional level. Sufficient patency of the canal foramina. 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 Correlation with this numbering scheme would be important should intervention be contemplated. 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 L4-5: Stenosis of both lateral recesses and neural foramina that would likely cause neural compression, particularly with respect to the exiting L4 nerves. L2-3: Mild stenosis of the lateral recesses and neural foramina. L1-2: Lateral recess and foraminal stenosis right more than left. Neural compression could possibly occur, particularly on the right. Electronically Signed   By: Nelson Chimes M.D.   On: 03/05/2021 13:41   VAS [place] LOWER EXTREMITY VENOUS (DVT)  Result Date: 03/06/2021  Lower Venous DVT Study Patient Name:  Shawn Bailey  Date of Exam:   03/06/2021 Medical Rec #: [id number]            Accession #:    [id number] Date of Birth: 16-Aug-1946            Patient Gender: M Patient Age:   74Y Exam Location:  [facility] Procedure:      VAS [place] LOWER EXTREMITY VENOUS (DVT) Referring Phys: [id number] KELLY A GRIFFITH --------------------------------------------------------------------------------  Indications: Malignancy.  Comparison Study: No prior study Performing Technologist: Maudry Mayhew MHA, RDMS, RVT, RDCS  Examination Guidelines: A complete evaluation includes B-mode imaging, spectral Doppler, color Doppler, and power Doppler as needed of all accessible portions of each vessel. Bilateral testing is considered an integral part of a complete examination. Limited examinations for reoccurring indications may be performed as noted. The reflux portion of the exam is performed with the patient in reverse Trendelenburg.  +---------+---------------+---------+-----------+----------+--------------+ RIGHT     CompressibilityPhasicitySpontaneityPropertiesThrombus Aging +---------+---------------+---------+-----------+----------+--------------+ CFV      Full           Yes      Yes                                 +---------+---------------+---------+-----------+----------+--------------+  SFJ      Full                                                        +---------+---------------+---------+-----------+----------+--------------+ FV Prox  Full                                                        +---------+---------------+---------+-----------+----------+--------------+ FV Mid   Full                                                        +---------+---------------+---------+-----------+----------+--------------+ FV DistalFull                                                        +---------+---------------+---------+-----------+----------+--------------+ PFV      Full                                                        +---------+---------------+---------+-----------+----------+--------------+ POP      Full           Yes      Yes                                 +---------+---------------+---------+-----------+----------+--------------+ PTV      Full                                                        +---------+---------------+---------+-----------+----------+--------------+ PERO     Full                                                        +---------+---------------+---------+-----------+----------+--------------+   +---------+---------------+---------+-----------+----------+--------------+ LEFT     CompressibilityPhasicitySpontaneityPropertiesThrombus Aging +---------+---------------+---------+-----------+----------+--------------+ CFV      Full           Yes      Yes                                 +---------+---------------+---------+-----------+----------+--------------+ SFJ      Full                                                         +---------+---------------+---------+-----------+----------+--------------+  FV Prox  Full                                                        +---------+---------------+---------+-----------+----------+--------------+ FV Mid   Full                                                        +---------+---------------+---------+-----------+----------+--------------+ FV DistalFull                                                        +---------+---------------+---------+-----------+----------+--------------+ PFV      Full                                                        +---------+---------------+---------+-----------+----------+--------------+ POP      Full           Yes      Yes                                 +---------+---------------+---------+-----------+----------+--------------+ PTV      Full                                                        +---------+---------------+---------+-----------+----------+--------------+ PERO     Full                                                        +---------+---------------+---------+-----------+----------+--------------+     Summary: RIGHT: - There is no evidence of deep vein thrombosis in the lower extremity.  - No cystic structure found in the popliteal fossa.  LEFT: - There is no evidence of deep vein thrombosis in the lower extremity.  - No cystic structure found in the popliteal fossa.  *See table(s) above for measurements and observations. Electronically signed by Deitra Mayo MD on 03/06/2021 at 10:06:53 AM.    Final     (Echo, Carotid, EGD, Colonoscopy, ERCP)    Subjective:no new c/o    Discharge Exam: Vitals:   03/08/21 1312 03/08/21 1325  BP: 116/90 134/90  Pulse: 87 87  Resp: 18 18  Temp: 98.1 F (36.7 C) 97.6 F (36.4 C)  SpO2: 98% 97%   Vitals:   03/08/21 1245 03/08/21 1250 03/08/21 1312 03/08/21 1325  BP: 131/88 109/75 116/90 134/90  Pulse: 87 86 87 87   Resp: (!) 21 14 18 18   Temp:   98.1 F (36.7 C) 97.6 F (36.4 C)  TempSrc:   Oral Oral  SpO2: 100% 100% 98% 97%  Weight:      Height:        General: Pt is alert, awake, not in acute distress Cardiovascular: RRR, S1/S2 +, no rubs, no gallops Respiratory: CTA bilaterally, no wheezing, no rhonchi Abdominal: Soft, NT, ND, bowel sounds + Extremities: no edema, no cyanosis    The results of significant diagnostics from this hospitalization (including imaging, microbiology, ancillary and laboratory) are listed below for reference.     Microbiology: Recent Results (from the past 240 hour(s))  Urine culture     Status: None   Collection Time: 03/05/21  1:35 PM   Specimen: In/Out Cath Urine  Result Value Ref Range Status   Specimen Description   Final    IN/OUT CATH URINE Performed at [facility], [place] [address], [place], [place] [address]    Special Requests   Final    NONE Performed at [facility], [place] [address] [place], [place] [address]    Culture   Final    NO GROWTH Performed at [facility], [place] [address] [place], [place] [address]    Report Status 03/07/2021 FINAL  Final  Resp Panel by RT-PCR (Flu A&B, Covid) Nasopharyngeal Swab     Status: None   Collection Time: 03/05/21  4:22 PM   Specimen: Nasopharyngeal Swab; Nasopharyngeal(NP) swabs in vial transport medium  Result Value Ref Range Status   SARS Coronavirus 2 by RT PCR NEGATIVE NEGATIVE Final    Comment: (NOTE) SARS-CoV-2 target nucleic acids are NOT DETECTED.  The SARS-CoV-2 RNA is generally detectable in upper respiratory specimens during the acute phase of infection. The lowest concentration of SARS-CoV-2 viral copies this assay can detect is 138 copies/mL. A negative result does not preclude SARS-Cov-2 infection and should not be used as the sole basis for treatment or other patient management decisions. A negative result may occur with  improper  specimen collection/handling, submission of specimen other than nasopharyngeal swab, presence of viral mutation(s) within the areas targeted by this assay, and inadequate number of viral copies(<138 copies/mL). A negative result must be combined with clinical observations, patient history, and epidemiological information. The expected result is Negative.  Fact Sheet for Patients:  EntrepreneurPulse.com.au  Fact Sheet for Healthcare Providers:  IncredibleEmployment.be  This test is no t yet approved or cleared by the [place] FDA and  has been authorized for detection and/or diagnosis of SARS-CoV-2 by FDA under an Emergency Use Authorization (EUA). This EUA will remain  in effect (meaning this test can be used) for the duration of the COVID-19 declaration under Section 564(b)(1) of the Act, 21 U.S.C.section 360bbb-3(b)(1), unless the authorization is terminated  or revoked sooner.       Influenza A by PCR NEGATIVE NEGATIVE Final   Influenza B by PCR NEGATIVE NEGATIVE Final    Comment: (NOTE) The Xpert Xpress SARS-CoV-2/FLU/RSV plus assay is intended as an aid in the diagnosis of influenza from Nasopharyngeal swab specimens and should not be used as a sole basis for treatment. Nasal washings and aspirates are unacceptable for Xpert Xpress SARS-CoV-2/FLU/RSV testing.  Fact Sheet for Patients: EntrepreneurPulse.com.au  Fact Sheet for Healthcare Providers: IncredibleEmployment.be  This test is not yet approved or cleared by the [place] FDA and has been authorized for detection and/or diagnosis of SARS-CoV-2 by FDA under an Emergency Use Authorization (EUA). This EUA will remain in effect (meaning this test can be used) for the duration  of the COVID-19 declaration under Section 564(b)(1) of the Act, 21 U.S.C. section 360bbb-3(b)(1), unless the authorization is terminated or revoked.  Performed at  [facility], [place] [address], [place], [place] [address]      Labs: BNP (last 3 results) No results for input(s): BNP in the last 8760 hours. Basic Metabolic Panel: Recent Labs  Lab 03/05/21 1157 03/05/21 1255 03/06/21 0500 03/07/21 0738 03/08/21 0446  NA 133*  --  135 135 137  K 3.7  --  3.9 3.9 4.2  CL 99  --  100 102 99  CO2 24  --  28 27 32  GLUCOSE 96  --  97 99 107*  BUN 19  --  15 21 23   CREATININE 0.78  --  0.63 0.70 1.00  CALCIUM 9.7  --  9.4 9.8 9.9  MG  --  2.0  --   --   --    Liver Function Tests: Recent Labs  Lab 03/05/21 1203 03/06/21 0500 03/07/21 0738 03/08/21 0446  AST 112* 96* 111* 127*  ALT 90* 81* 93* 108*  ALKPHOS 60 48 53 59  BILITOT 0.6 0.7 0.5 0.5  PROT 8.2* 7.6 7.7 8.7*  ALBUMIN 3.7 3.3* 3.4* 3.7   Recent Labs  Lab 03/05/21 1336  LIPASE 34   No results for input(s): AMMONIA in the last 168 hours. CBC: Recent Labs  Lab 03/05/21 1157 03/08/21 0446  WBC 5.6 7.3  HGB 13.7 15.1  HCT 39.4 45.4  MCV 93.4 96.0  PLT 130* 133*   Cardiac Enzymes: No results for input(s): CKTOTAL, CKMB, CKMBINDEX, TROPONINI in the last 168 hours. BNP: Invalid input(s): POCBNP CBG: Recent Labs  Lab 03/05/21 1303  GLUCAP 91   D-Dimer Recent Labs    03/05/21 1819  DDIMER 0.72*   Hgb A1c No results for input(s): HGBA1C in the last 72 hours. Lipid Profile No results for input(s): CHOL, HDL, LDLCALC, TRIG, CHOLHDL, LDLDIRECT in the last 72 hours. Thyroid function studies No results for input(s): TSH, T4TOTAL, T3FREE, THYROIDAB in the last 72 hours.  Invalid input(s): FREET3 Anemia work up No results for input(s): VITAMINB12, FOLATE, FERRITIN, TIBC, IRON, RETICCTPCT in the last 72 hours. Urinalysis    Component Value Date/Time   COLORURINE YELLOW 03/05/2021 1335   APPEARANCEUR CLEAR 03/05/2021 1335   LABSPEC 1.017 03/05/2021 1335   PHURINE 6.0 03/05/2021 1335   GLUCOSEU NEGATIVE 03/05/2021 1335   HGBUR NEGATIVE  03/05/2021 1335   [place] 03/05/2021 1335   KETONESUR NEGATIVE 03/05/2021 1335   PROTEINUR NEGATIVE 03/05/2021 1335   UROBILINOGEN 0.2 05/05/2007 2151   NITRITE NEGATIVE 03/05/2021 1335   LEUKOCYTESUR NEGATIVE 03/05/2021 1335   Sepsis Labs Invalid input(s): PROCALCITONIN,  WBC,  LACTICIDVEN Microbiology Recent Results (from the past 240 hour(s))  Urine culture     Status: None   Collection Time: 03/05/21  1:35 PM   Specimen: In/Out Cath Urine  Result Value Ref Range Status   Specimen Description   Final    IN/OUT CATH URINE Performed at [facility], [place] [address], [place], [place] [address]    Special Requests   Final    NONE Performed at [facility], [place] [address] [place], [place] [address]    Culture   Final    NO GROWTH Performed at [facility], [place] [address] [place], [place] [address]    Report Status 03/07/2021 FINAL  Final  Resp Panel by RT-PCR (Flu A&B, Covid) Nasopharyngeal  Swab     Status: None   Collection Time: 03/05/21  4:22 PM   Specimen: Nasopharyngeal Swab; Nasopharyngeal(NP) swabs in vial transport medium  Result Value Ref Range Status   SARS Coronavirus 2 by RT PCR NEGATIVE NEGATIVE Final    Comment: (NOTE) SARS-CoV-2 target nucleic acids are NOT DETECTED.  The SARS-CoV-2 RNA is generally detectable in upper respiratory specimens during the acute phase of infection. The lowest concentration of SARS-CoV-2 viral copies this assay can detect is 138 copies/mL. A negative result does not preclude SARS-Cov-2 infection and should not be used as the sole basis for treatment or other patient management decisions. A negative result may occur with  improper specimen collection/handling, submission of specimen other than nasopharyngeal swab, presence of viral mutation(s) within the areas targeted by this assay, and inadequate number of viral copies(<138 copies/mL). A negative result must be combined  with clinical observations, patient history, and epidemiological information. The expected result is Negative.  Fact Sheet for Patients:  EntrepreneurPulse.com.au  Fact Sheet for Healthcare Providers:  IncredibleEmployment.be  This test is no t yet approved or cleared by the [place] FDA and  has been authorized for detection and/or diagnosis of SARS-CoV-2 by FDA under an Emergency Use Authorization (EUA). This EUA will remain  in effect (meaning this test can be used) for the duration of the COVID-19 declaration under Section 564(b)(1) of the Act, 21 U.S.C.section 360bbb-3(b)(1), unless the authorization is terminated  or revoked sooner.       Influenza A by PCR NEGATIVE NEGATIVE Final   Influenza B by PCR NEGATIVE NEGATIVE Final    Comment: (NOTE) The Xpert Xpress SARS-CoV-2/FLU/RSV plus assay is intended as an aid in the diagnosis of influenza from Nasopharyngeal swab specimens and should not be used as a sole basis for treatment. Nasal washings and aspirates are unacceptable for Xpert Xpress SARS-CoV-2/FLU/RSV testing.  Fact Sheet for Patients: EntrepreneurPulse.com.au  Fact Sheet for Healthcare Providers: IncredibleEmployment.be  This test is not yet approved or cleared by the [place] FDA and has been authorized for detection and/or diagnosis of SARS-CoV-2 by FDA under an Emergency Use Authorization (EUA). This EUA will remain in effect (meaning this test can be used) for the duration of the COVID-19 declaration under Section 564(b)(1) of the Act, 21 U.S.C. section 360bbb-3(b)(1), unless the authorization is terminated or revoked.  Performed at [facility], [place] [address] [place],  [address]      Time coordinating discharge: 39 minutes  SIGNED:   Georgette Shell, MD  [facility] 03/08/2021, 1:38 PM

## 2021-03-08 NOTE — TOC Progression Note (Addendum)
Transition of Care [facility]) - Progression Note   Patient Details  Name: Shawn Bailey MRN: [id number] Date of Birth: 1946/02/20  Transition of Care [facility]) CM/SW [place], LCSW Phone Number: 03/08/2021, 11:48 AM  Clinical Narrative: PT evaluation recommended HHPT. CSW spoke with patient and patient is agreeable to referral for [facility]. Patient does not have a preference. CSW spoke with patient regarding medication assistance and explained that as patient has insurance TOC cannot assist with a MATCH voucher. Patient asked about a Medicaid application and patient was notified he will need to call DSS to do a Medicaid application. DSS contact information added to AVS. Patient referred to [facility] for HHPT. TOC to follow.  Addendum: Patient to discharge home today. HH orders placed. CSW notified Amy with [facility].  Expected Discharge Plan: [place] Barriers to Discharge: Continued Medical Work up  Expected Discharge Plan and Services Expected Discharge Plan: [place] In-house Referral: Clinical Social Work Discharge Planning Services: CM Consult Post Acute Care Choice: [place] arrangements for the past 2 months: Single Family Home HH Arranged: PT [place]: [place] Date [place]: 03/08/21 Time [place]: 1137 Representative spoke with at [place]: Amy  Readmission Risk Interventions No flowsheet data found.

## 2021-03-08 NOTE — Plan of Care (Signed)
Pt was discharged home today. Instructions were reviewed with patient, and questions were answered. Pt was taken to main entrance via wheelchair by NT.  

## 2021-03-10 NOTE — Progress Notes (Signed)
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 Right hepatic lobe masses which are suspicious for either metastatic disease or primary malignancy such as hepatocellular carcinoma. These are suboptimally evaluated on this noncontrast exam. Consider multidisciplinary oncology consultation with potential clinical strategies of tissue sampling versus PET to direct sampling. 2. No primary malignancy identified within the chest, abdomen, or pelvis, given limitation of noncontrast exam. 3. Left nephrolithiasis. 4. Coronary artery atherosclerosis. Aortic Atherosclerosis (ICD10-I70.0). 5. Esophageal air fluid level suggests dysmotility or gastroesophageal reflux.   03/05/2021 Imaging   CT Chest  IMPRESSION: 1. Right hepatic lobe masses which are suspicious for either metastatic disease or primary malignancy such as hepatocellular carcinoma. These are suboptimally evaluated on this noncontrast exam. Consider multidisciplinary oncology consultation with potential clinical strategies of tissue sampling versus PET to direct sampling. 2. No primary malignancy identified within the chest, abdomen, or pelvis,  given limitation of noncontrast exam. 3. Left nephrolithiasis. 4. Coronary artery atherosclerosis. Aortic Atherosclerosis (ICD10-I70.0). 5. Esophageal air fluid level suggests dysmotility or gastroesophageal reflux.   03/06/2021 Imaging   MRI Liver  IMPRESSION: 9.5 cm heterogeneous hypovascular mass in anterior right hepatic lobe, and 1.3 cm hypervascular mass in the posterior right hepatic lobe. Differential diagnosis includes liver metastases and multifocal hepatocellular carcinoma. Suggest correlation with serum AFP level, and possible tissue sampling.   No evidence of abdominal metastatic disease.   03/08/2021 Initial Biopsy   Still pending.       HISTORY OF PRESENTING ILLNESS:  Shawn Bailey 75 y.o. male without significant past medical history is here because of newly diagnosed liver cancer. The patient was referred by hospitalist on recent hospital discharge. The patient presents to the clinic today accompanied by his ex-wife Shawn Bailey.  He presented to hospital on Mar 05, 2021 for right-sided chest pain, fatigue, weight loss.  He has noticed progressive weakness in the past few months, and about 50 to 60 pound weight loss, along with loss of appetite.  He lives alone, it has been getting difficult for him to take care of himself at home.  He reports episode of passing out when he was sitting in the yard feeding birds.  His chest pain started a few weeks ago, it is located in the right lateral low chest, and it is triggered by taking deep breaths or turning around.  No significant dyspnea, or chest tightness.  He was admitted to hospital for further evaluation, CT chest showed right hepatic lobe masses, highly suspicious for malignancy. Abdominal MRI on 03/06/21 showed 9.5 x 6.5 cm large mass in segment 8 of right lobe liver, and a additional 1.3 x 1 1.2 cm lesion in segment 6/7.  Both are highly suspicious for malignancy.  No adenopathy or  other metastasis.  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  He underwent liver biopsy on Mar 08, 2021, and was discharged home.   He has not seen physician routinely for most of his life before this hospitalization. He denies any significant past medical history.  No known liver disease.  He used to drink alcohol moderately, stopped several years ago.  He reports blood transfusion when he was 17.  No other surgery.  He lives alone, retired.  He has 2 children, but they are not in close contact with him or his ex-wife.    REVIEW OF SYSTEMS:    Constitutional: Denies fevers, chills or abnormal night sweats, (+) moderate fatigue and 60 pound weight loss in the past 2 months. Eyes: Denies blurriness of vision, double vision or watery eyes Ears, nose, mouth, throat, and face: Denies mucositis or sore throat Respiratory: Denies cough, dyspnea or wheezes, (+) right low lateral chest pain Cardiovascular: Denies palpitation, chest discomfort or lower extremity swelling Gastrointestinal:  Denies nausea, heartburn or change in bowel habits, (+) decreased appetite Skin: Denies abnormal skin rashes Lymphatics: Denies new lymphadenopathy or easy bruising Neurological:Denies numbness, tingling or new weaknesses Behavioral/Psych: Mood is stable, no new changes  All other systems were reviewed with the patient and are negative.   MEDICAL HISTORY:  Past Medical History:  Diagnosis Date  . Depression   . Parasite infestation     SURGICAL HISTORY: Past Surgical History:  Procedure Laterality Date  . NO PAST SURGERIES      SOCIAL HISTORY: Social History   Socioeconomic History  . Marital status: Divorced    Spouse name: Not on file  . Number of children: 2  . Years of education: Not on file  . Highest education level: Not on file  Occupational History  . Not on file  Tobacco Use  . Smoking status: Never Smoker  . Smokeless tobacco: Never Used  Vaping Use  . Vaping Use: Never used   Substance and Sexual Activity  . Alcohol use: Yes    Comment: occasional beer, he used to drink alcohol regularly 15 years   . Drug use: Not Currently  . Sexual activity: Not on file  Other Topics Concern  . Not on file  Social History Narrative  . Not on file   Social Determinants of Health   Financial Resource Strain: Not on file  Food Insecurity: Not on file  Transportation Needs: Not on file  Physical Activity: Not on file  Stress: Not on file  Social Connections: Not on file  Intimate Partner Violence: Not on file    FAMILY HISTORY: Family History  Problem Relation Age of Onset  . Diabetes Mother     ALLERGIES:  has No Known Allergies.  MEDICATIONS:  Current Outpatient Medications  Medication Sig Dispense Refill  . Ascorbic Acid (VITAMIN C) 100 MG tablet Take 100 mg by mouth daily.    . Multiple Vitamin (MULTIVITAMIN WITH MINERALS) TABS tablet Take 1 tablet by mouth daily.    . polyethylene glycol (MIRALAX / GLYCOLAX) 17 g packet Take 17 g by mouth daily as needed for mild constipation. 14 each 0  . senna (SENOKOT) 8.6 MG TABS tablet Take 1 tablet (8.6 mg total) by mouth 2 (two) times daily. 120 tablet 0  . vitamin B-12 (CYANOCOBALAMIN) 100 MCG tablet Take 100 mcg by mouth daily.     No current facility-administered medications for this visit.    PHYSICAL  EXAMINATION: ECOG PERFORMANCE STATUS: 2 - Symptomatic, <50% confined to bed  Vitals:   03/11/21 1504  BP: (!) 145/88  Pulse: (!) 102  Resp: 16  Temp: 97.9 F (36.6 C)  SpO2: 100%   Filed Weights   03/11/21 1504  Weight: 126 lb 3.2 oz (57.2 kg)    GENERAL:alert, no distress and comfortable SKIN: skin color, texture, turgor are normal, no rashes or significant lesions EYES: normal, Conjunctiva are pink and non-injected, sclera clear NECK: supple, thyroid normal size, non-tender, without nodularity LYMPH:  no palpable lymphadenopathy in the cervical, axillary  LUNGS: clear to auscultation and  percussion with normal breathing effort HEART: regular rate & rhythm and no murmurs and no lower extremity edema ABDOMEN:abdomen soft, non-tender and normal bowel sounds. (+) Liver is palpable, 3cm below rib cage. Musculoskeletal:no cyanosis of digits and no clubbing  NEURO: alert & oriented x 3 with fluent speech, no focal motor/sensory deficits  LABORATORY DATA:  I have reviewed the data as listed CBC Latest Ref Rng & Units 03/08/2021 03/05/2021 02/08/2016  WBC 4.0 - 10.5 K/uL 7.3 5.6 4.8  Hemoglobin 13.0 - 17.0 g/dL 15.1 13.7 14.0  Hematocrit 39.0 - 52.0 % 45.4 39.4 39.4  Platelets 150 - 400 K/uL 133(L) 130(L) 101(L)    CMP Latest Ref Rng & Units 03/08/2021 03/07/2021 03/06/2021  Glucose 70 - 99 mg/dL 107(H) 99 97  BUN 8 - 23 mg/dL 23 21 15   Creatinine 0.61 - 1.24 mg/dL 1.00 0.70 0.63  Sodium 135 - 145 mmol/L 137 135 135  Potassium 3.5 - 5.1 mmol/L 4.2 3.9 3.9  Chloride 98 - 111 mmol/L 99 102 100  CO2 22 - 32 mmol/L 32 27 28  Calcium 8.9 - 10.3 mg/dL 9.9 9.8 9.4  Total Protein 6.5 - 8.1 g/dL 8.7(H) 7.7 7.6  Total Bilirubin 0.3 - 1.2 mg/dL 0.5 0.5 0.7  Alkaline Phos 38 - 126 U/L 59 53 48  AST 15 - 41 U/L 127(H) 111(H) 96(H)  ALT 0 - 44 U/L 108(H) 93(H) 81(H)     RADIOGRAPHIC STUDIES: I have personally reviewed the radiological images as listed and agreed with the findings in the report. CT Abdomen Pelvis Wo Contrast  Result Date: 03/05/2021 CLINICAL DATA:  Chest pain since yesterday.  Weight loss.  Weakness. EXAM: CT CHEST, ABDOMEN AND PELVIS WITHOUT CONTRAST TECHNIQUE: Multidetector CT imaging of the chest, abdomen and pelvis was performed following the standard protocol without IV contrast. COMPARISON:  05/05/2007 abdominopelvic CT, report only. Chest radiograph of earlier today. FINDINGS: CT CHEST FINDINGS Cardiovascular: Aortic atherosclerosis. Tortuous thoracic aorta. Normal heart size, without pericardial effusion. Left main coronary artery calcification. Distal right or left  circumflex coronary calcification as well. Mediastinum/Nodes: No supraclavicular adenopathy. No mediastinal or definite hilar adenopathy, given limitations of unenhanced CT. Esophageal fluid level on 30/3. Lungs/Pleura: No pleural fluid. Right lower lobe calcified granuloma on 119/5. Probable subpleural lymph node in the superior segment right lower lobe at 6 mm on 65/5. Left upper lobe calcified granuloma on 36/5. Musculoskeletal: Lower thoracic and lower cervical spondylosis. CT ABDOMEN PELVIS FINDINGS Hepatobiliary: Dominant hepatic dome area of hypoattenuation measures 6.1 x 7.6 cm on 58/3. A separate posterior right hepatic lobe hypoattenuating lesion of 1.7 cm on 62/3. Normal gallbladder, without biliary ductal dilatation. Pancreas: Normal, without mass or ductal dilatation. Spleen: Normal in size, without focal abnormality. Adrenals/Urinary Tract: Normal adrenal glands. Left renal collecting system calculi of maximally 6 mm in the upper pole. No right-sided renal calculi. No hydronephrosis. No bladder  calculi. Stomach/Bowel: Normal stomach, without wall thickening. Normal colon, appendix, and terminal ileum. Normal small bowel. Vascular/Lymphatic: Aortic atherosclerosis. No abdominopelvic adenopathy. Reproductive: Trace bilateral hydroceles are likely physiologic. Normal prostate. Other: No significant free fluid. Musculoskeletal: S-shaped thoracolumbar spine curvature. Thoracolumbar spondylosis. IMPRESSION: 1. Right hepatic lobe masses which are suspicious for either metastatic disease or primary malignancy such as hepatocellular carcinoma. These are suboptimally evaluated on this noncontrast exam. Consider multidisciplinary oncology consultation with potential clinical strategies of tissue sampling versus PET to direct sampling. 2. No primary malignancy identified within the chest, abdomen, or pelvis, given limitation of noncontrast exam. 3. Left nephrolithiasis. 4. Coronary artery atherosclerosis. Aortic  Atherosclerosis (ICD10-I70.0). 5. Esophageal air fluid level suggests dysmotility or gastroesophageal reflux. Electronically Signed   By: Abigail Miyamoto M.D.   On: 03/05/2021 14:07   DG Chest 2 View  Result Date: 03/05/2021 CLINICAL DATA:  Intermittent chest pain since yesterday. EXAM: CHEST - 2 VIEW COMPARISON:  02/08/2016 FINDINGS: Heart size is normal. Minor aortic atherosclerotic calcification is noted. The pulmonary vascularity is normal. The lungs are clear. No effusions. No significant bone finding IMPRESSION: No active disease.  Minor aortic atherosclerotic calcification. Electronically Signed   By: Nelson Chimes M.D.   On: 03/05/2021 12:53   CT Chest Wo Contrast  Result Date: 03/05/2021 CLINICAL DATA:  Chest pain since yesterday.  Weight loss.  Weakness. EXAM: CT CHEST, ABDOMEN AND PELVIS WITHOUT CONTRAST TECHNIQUE: Multidetector CT imaging of the chest, abdomen and pelvis was performed following the standard protocol without IV contrast. COMPARISON:  05/05/2007 abdominopelvic CT, report only. Chest radiograph of earlier today. FINDINGS: CT CHEST FINDINGS Cardiovascular: Aortic atherosclerosis. Tortuous thoracic aorta. Normal heart size, without pericardial effusion. Left main coronary artery calcification. Distal right or left circumflex coronary calcification as well. Mediastinum/Nodes: No supraclavicular adenopathy. No mediastinal or definite hilar adenopathy, given limitations of unenhanced CT. Esophageal fluid level on 30/3. Lungs/Pleura: No pleural fluid. Right lower lobe calcified granuloma on 119/5. Probable subpleural lymph node in the superior segment right lower lobe at 6 mm on 65/5. Left upper lobe calcified granuloma on 36/5. Musculoskeletal: Lower thoracic and lower cervical spondylosis. CT ABDOMEN PELVIS FINDINGS Hepatobiliary: Dominant hepatic dome area of hypoattenuation measures 6.1 x 7.6 cm on 58/3. A separate posterior right hepatic lobe hypoattenuating lesion of 1.7 cm on 62/3.  Normal gallbladder, without biliary ductal dilatation. Pancreas: Normal, without mass or ductal dilatation. Spleen: Normal in size, without focal abnormality. Adrenals/Urinary Tract: Normal adrenal glands. Left renal collecting system calculi of maximally 6 mm in the upper pole. No right-sided renal calculi. No hydronephrosis. No bladder calculi. Stomach/Bowel: Normal stomach, without wall thickening. Normal colon, appendix, and terminal ileum. Normal small bowel. Vascular/Lymphatic: Aortic atherosclerosis. No abdominopelvic adenopathy. Reproductive: Trace bilateral hydroceles are likely physiologic. Normal prostate. Other: No significant free fluid. Musculoskeletal: S-shaped thoracolumbar spine curvature. Thoracolumbar spondylosis. IMPRESSION: 1. Right hepatic lobe masses which are suspicious for either metastatic disease or primary malignancy such as hepatocellular carcinoma. These are suboptimally evaluated on this noncontrast exam. Consider multidisciplinary oncology consultation with potential clinical strategies of tissue sampling versus PET to direct sampling. 2. No primary malignancy identified within the chest, abdomen, or pelvis, given limitation of noncontrast exam. 3. Left nephrolithiasis. 4. Coronary artery atherosclerosis. Aortic Atherosclerosis (ICD10-I70.0). 5. Esophageal air fluid level suggests dysmotility or gastroesophageal reflux. Electronically Signed   By: Abigail Miyamoto M.D.   On: 03/05/2021 14:07   MR LIVER W WO CONTRAST  Result Date: 03/06/2021 CLINICAL DATA:  Hepatic masses on recent CT. EXAM:  MRI ABDOMEN WITHOUT AND WITH CONTRAST TECHNIQUE: Multiplanar multisequence MR imaging of the abdomen was performed both before and after the administration of intravenous contrast. CONTRAST:  9mL GADAVIST GADOBUTROL 1 MMOL/ML IV SOLN COMPARISON:  CT on 03/05/2021 FINDINGS: Lower chest: No acute findings. Hepatobiliary: A heterogeneously enhancing hypovascular mass is seen which is centered in  segment 8 in the liver dome, measuring 9.5 x 6.6 cm on image 23/16. A small mass is seen in segment 6/7 of the right lobe, which shows arterial phase hyperenhancement, contrast washout, and delayed peripheral rim enhancement. This measures 1.3 x 1.2 cm on image 33/24. A tiny sub-cm subcapsular cyst is seen in the posterior right hepatic lobe. No other liver masses are identified. No evidence of portal or hepatic vein thrombus. No definite gross morphologic findings of cirrhosis are noted. Gallbladder is unremarkable. No evidence of biliary ductal dilatation. Pancreas:  No mass or inflammatory changes. Spleen:  Within normal limits in size and appearance. Adrenals/Urinary Tract: No masses identified. Tiny sub-cm left renal cyst noted. No evidence of hydronephrosis. Stomach/Bowel: Visualized portion unremarkable. Vascular/Lymphatic: No pathologically enlarged lymph nodes identified. No abdominal aortic aneurysm. Other:  None. Musculoskeletal:  No suspicious bone lesions identified. IMPRESSION: 9.5 cm heterogeneous hypovascular mass in anterior right hepatic lobe, and 1.3 cm hypervascular mass in the posterior right hepatic lobe. Differential diagnosis includes liver metastases and multifocal hepatocellular carcinoma. Suggest correlation with serum AFP level, and possible tissue sampling. No evidence of abdominal metastatic disease. Electronically Signed   By: Marlaine Hind M.D.   On: 03/06/2021 16:08   US BIOPSY (LIVER)  Result Date: 03/08/2021 INDICATION: Large central anterior liver mass, concern for metastatic disease or hepatocellular carcinoma EXAM: ULTRASOUND RIGHT LIVER MASS 18 GAUGE CORE BIOPSY MEDICATIONS: 1% LIDOCAINE LOCAL ANESTHESIA/SEDATION: Moderate (conscious) sedation was employed during this procedure. A total of Versed 2.0 mg and Fentanyl 0 100 mcg was administered intravenously. Moderate Sedation Time: 10 minutes. The patient's level of consciousness and vital signs were monitored continuously by  radiology nursing throughout the procedure under my direct supervision. FLUOROSCOPY TIME:  Fluoroscopy Time: NONE. COMPLICATIONS: None immediate. PROCEDURE: Informed written consent was obtained from the patient after a thorough discussion of the procedural risks, benefits and alternatives. All questions were addressed. Maximal Sterile Barrier Technique was utilized including caps, mask, sterile gowns, sterile gloves, sterile drape, hand hygiene and skin antiseptic. A timeout was performed prior to the initiation of the procedure. previous imaging reviewed. preliminary ultrasound performed. the anterior right liver lesion was localized and marked. this was correlated with the prior mri and ct. under sterile conditions and local anesthesia, a 17 gauge coaxial guide was advanced to the hyperechoic right liver lesion. needle position confirmed with ultrasound. images obtained for documentation. 18 gauge core biopsies obtained. samples were intact and non fragmented. these were placed in formalin. needle tract occluded with gel-foam. postprocedure imaging demonstrates no hemorrhage or hematoma. patient tolerated biopsy well. IMPRESSION: Successful ultrasound right liver mass 18 gauge core biopsies Electronically Signed   By: Jerilynn Mages.  Shick M.D.   On: 03/08/2021 13:59   CT L-SPINE NO CHARGE  Result Date: 03/05/2021 CLINICAL DATA:  Weight loss over the last month. Chest and abdominal pain. EXAM: CT LUMBAR SPINE WITHOUT CONTRAST TECHNIQUE: Multidetector CT imaging of the lumbar spine was performed without intravenous contrast administration. Multiplanar CT image reconstructions were also generated. 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 Alignment: Thoracolumbar curvature convex to the left and lower lumbar curvature convex to the right. Vertebrae: No  fracture or primary bone lesion. Discogenic sclerotic changes at the L1-2 level. Paraspinal and other soft tissues: See results of  abdominal CT. Disc levels: T11-12: Mild disc bulge and facet osteoarthritis. No compressive stenosis. T12-L1: Disc bulge and facet osteoarthritis. Mild right foraminal narrowing, not likely significant. L1-2: Chronic disc degeneration with loss of disc height, endplate osteophytes and sclerotic change of the endplates. Facet degeneration and hypertrophy. Stenosis of both lateral recesses and foramina, right more than left. Neural compression could occur at this level, particularly on the right. L2-3: Endplate osteophytes and bulging of the disc. Facet and ligamentous hypertrophy. Mild stenosis of both lateral recesses and neural foramina that could possibly be significant. L3-4: Circumferential bulging of the disc. Facet and ligamentous hypertrophy. Severe multifactorial stenosis likely to cause neural compression. L4-5: Endplate osteophytes and bulging of the disc. Facet degeneration and hypertrophy. Stenosis of both lateral recesses and foramina. Neural compression could occur on either side. L5-S1: Transitional level. Sufficient patency of the canal foramina. 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 Correlation with this numbering scheme would be important should intervention be contemplated. 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 L4-5: Stenosis of both lateral recesses and neural foramina that would likely cause neural compression, particularly with respect to the exiting L4 nerves. L2-3: Mild stenosis of the lateral recesses and neural foramina. L1-2: Lateral recess and foraminal stenosis right more than left. Neural compression could possibly occur, particularly on the right. Electronically Signed   By: Nelson Chimes M.D.   On: 03/05/2021 13:41   VAS [place] LOWER EXTREMITY VENOUS (DVT)  Result Date: 03/06/2021  Lower Venous DVT Study Patient Name:  Shawn Bailey  Date of Exam:   03/06/2021 Medical Rec #: [id number]            Accession #:    [id number] Date of Birth:  01-Sep-1946            Patient Gender: M Patient Age:   74Y Exam Location:  [facility] Procedure:      VAS [place] LOWER EXTREMITY VENOUS (DVT) Referring Phys: [id number] KELLY A GRIFFITH --------------------------------------------------------------------------------  Indications: Malignancy.  Comparison Study: No prior study Performing Technologist: Maudry Mayhew MHA, RDMS, RVT, RDCS  Examination Guidelines: A complete evaluation includes B-mode imaging, spectral Doppler, color Doppler, and power Doppler as needed of all accessible portions of each vessel. Bilateral testing is considered an integral part of a complete examination. Limited examinations for reoccurring indications may be performed as noted. The reflux portion of the exam is performed with the patient in reverse Trendelenburg.  +---------+---------------+---------+-----------+----------+--------------+ RIGHT    CompressibilityPhasicitySpontaneityPropertiesThrombus Aging +---------+---------------+---------+-----------+----------+--------------+ CFV      Full           Yes      Yes                                 +---------+---------------+---------+-----------+----------+--------------+ SFJ      Full                                                        +---------+---------------+---------+-----------+----------+--------------+ FV Prox  Full                                                        +---------+---------------+---------+-----------+----------+--------------+  FV Mid   Full                                                        +---------+---------------+---------+-----------+----------+--------------+ FV DistalFull                                                        +---------+---------------+---------+-----------+----------+--------------+ PFV      Full                                                         +---------+---------------+---------+-----------+----------+--------------+ POP      Full           Yes      Yes                                 +---------+---------------+---------+-----------+----------+--------------+ PTV      Full                                                        +---------+---------------+---------+-----------+----------+--------------+ PERO     Full                                                        +---------+---------------+---------+-----------+----------+--------------+   +---------+---------------+---------+-----------+----------+--------------+ LEFT     CompressibilityPhasicitySpontaneityPropertiesThrombus Aging +---------+---------------+---------+-----------+----------+--------------+ CFV      Full           Yes      Yes                                 +---------+---------------+---------+-----------+----------+--------------+ SFJ      Full                                                        +---------+---------------+---------+-----------+----------+--------------+ FV Prox  Full                                                        +---------+---------------+---------+-----------+----------+--------------+ FV Mid   Full                                                        +---------+---------------+---------+-----------+----------+--------------+  FV DistalFull                                                        +---------+---------------+---------+-----------+----------+--------------+ PFV      Full                                                        +---------+---------------+---------+-----------+----------+--------------+ POP      Full           Yes      Yes                                 +---------+---------------+---------+-----------+----------+--------------+ PTV      Full                                                         +---------+---------------+---------+-----------+----------+--------------+ PERO     Full                                                        +---------+---------------+---------+-----------+----------+--------------+     Summary: RIGHT: - There is no evidence of deep vein thrombosis in the lower extremity.  - No cystic structure found in the popliteal fossa.  LEFT: - There is no evidence of deep vein thrombosis in the lower extremity.  - No cystic structure found in the popliteal fossa.  *See table(s) above for measurements and observations. Electronically signed by Deitra Mayo MD on 03/06/2021 at 10:06:53 AM.    Final     ASSESSMENT & PLAN:  Shawn Bailey is a 75 y.o. male without significant past medical history, presented with right-sided lower chest pain and 60 pound weight loss   1.  Multifocal hepatocellular carcinoma, cT3N0M, stage IIIA -I reviewed his lab results, CT and MRI images with patient and his ex-wife, and discussed his recent liver biopsy results with him in detail. -Liver MRI showed a large 9.5 cm and a 1.3 cm liver mass is seen in right lobe, biopsy confirmed moderately differentiated hepatocellular carcinoma.  No nodal or distant metastasis on scan. -This is likely related to his HCV infection -He is not a candidate for liver transplant.  I will discuss his case in GI tumor board next week, to get a surgical opinion to see if his liver cancer is resectable.  Given the large size tumor and multifocal disease, I suspect that this is may not be resectable. -If surgery is not feasible, I recommend liver targeted therapy, such as planned or radioembolization by interventional radiology.  I discussed the procedures with him.  He is interested. -I discussed that his liver cancer is not curable without surgery, and all treatment palliative for disease control, and prolong his life. -I discussed systemic treatment options, such as TKIs, immunotherapy in combination  with VGFR inhibitor.  I discussed benefit and potential side effects, he is interested. -Given his large tumor burden, and symptomatic disease, I recommend post liver targeted therapy and systemic therapy. I can start him on first-line atezolizumab and bevacizumab, while he is awaiting for liver embolization.   2. HCV  -During 02/2021 Hospitalization, labs showed HIV non reactive and hepatitis panel hep C ab positive, he was not aware that he has HCV infection  -due to his recent [facility] diagnosis, will hold of HCV treatment for now  -f/u LFTs   3. Weight loss and fatigue -Secondary to liver cancer -Nutrition consult, we give him a case of Ensure today   PLAN:  -We will present his case in GI tumor board next week, to see if he is a candidate for liver resection -If his liver cancer is not resectable, we will refer him to IR for liver embolization, and I plan to start him on first-line systemic treatment atezolizumab and bevacizumab -Nutrition consult -Patient given me consent to call his ex-wife Shawn Bailey to discuss tumor board recommendations    No orders of the defined types were placed in this encounter.   All questions were answered. The patient knows to call the clinic with any problems, questions or concerns. The total time spent in the appointment was 60 minutes.     [name], MD 03/11/2021   I, Joslyn Devon, am acting as scribe for [name], MD.   I have reviewed the above documentation for accuracy and completeness, and I agree with the above.

## 2021-03-11 ENCOUNTER — Other Ambulatory Visit: Payer: Self-pay

## 2021-03-11 ENCOUNTER — Encounter: Payer: Self-pay | Admitting: Hematology

## 2021-03-11 ENCOUNTER — Encounter (HOSPITAL_COMMUNITY): Payer: Self-pay | Admitting: Dietician

## 2021-03-11 ENCOUNTER — Inpatient Hospital Stay: Payer: Medicare Other | Attending: Hematology | Admitting: Hematology

## 2021-03-11 VITALS — BP 145/88 | HR 102 | Temp 97.9°F | Resp 16 | Ht 65.0 in | Wt 126.2 lb

## 2021-03-11 DIAGNOSIS — J8410 Pulmonary fibrosis, unspecified: Secondary | ICD-10-CM | POA: Diagnosis not present

## 2021-03-11 DIAGNOSIS — N200 Calculus of kidney: Secondary | ICD-10-CM | POA: Diagnosis not present

## 2021-03-11 DIAGNOSIS — I2510 Atherosclerotic heart disease of native coronary artery without angina pectoris: Secondary | ICD-10-CM | POA: Diagnosis not present

## 2021-03-11 DIAGNOSIS — K219 Gastro-esophageal reflux disease without esophagitis: Secondary | ICD-10-CM | POA: Diagnosis not present

## 2021-03-11 DIAGNOSIS — R630 Anorexia: Secondary | ICD-10-CM | POA: Insufficient documentation

## 2021-03-11 DIAGNOSIS — Z79899 Other long term (current) drug therapy: Secondary | ICD-10-CM | POA: Insufficient documentation

## 2021-03-11 DIAGNOSIS — M47812 Spondylosis without myelopathy or radiculopathy, cervical region: Secondary | ICD-10-CM | POA: Insufficient documentation

## 2021-03-11 DIAGNOSIS — C220 Liver cell carcinoma: Secondary | ICD-10-CM | POA: Insufficient documentation

## 2021-03-11 DIAGNOSIS — M5136 Other intervertebral disc degeneration, lumbar region: Secondary | ICD-10-CM | POA: Diagnosis not present

## 2021-03-11 DIAGNOSIS — Z833 Family history of diabetes mellitus: Secondary | ICD-10-CM | POA: Insufficient documentation

## 2021-03-11 DIAGNOSIS — R634 Abnormal weight loss: Secondary | ICD-10-CM | POA: Diagnosis not present

## 2021-03-11 DIAGNOSIS — I700 Atherosclerosis of aorta: Secondary | ICD-10-CM | POA: Diagnosis not present

## 2021-03-11 DIAGNOSIS — R55 Syncope and collapse: Secondary | ICD-10-CM | POA: Insufficient documentation

## 2021-03-11 DIAGNOSIS — M5135 Other intervertebral disc degeneration, thoracolumbar region: Secondary | ICD-10-CM | POA: Insufficient documentation

## 2021-03-11 DIAGNOSIS — R531 Weakness: Secondary | ICD-10-CM | POA: Insufficient documentation

## 2021-03-11 DIAGNOSIS — R160 Hepatomegaly, not elsewhere classified: Secondary | ICD-10-CM

## 2021-03-11 DIAGNOSIS — M48061 Spinal stenosis, lumbar region without neurogenic claudication: Secondary | ICD-10-CM | POA: Diagnosis not present

## 2021-03-11 LAB — SURGICAL PATHOLOGY

## 2021-03-11 NOTE — Progress Notes (Unsigned)
am

## 2021-03-11 NOTE — Progress Notes (Signed)
Nutrition  Received secure chat from RN Valda Favia) requesting case of Ensure for patient. Reports patient has lost 60 pounds over the last 2 months. Complimentary Ensure Plus case provided today. Nutrition referral will be placed per RN.

## 2021-03-12 ENCOUNTER — Telehealth: Payer: Self-pay | Admitting: Nutrition

## 2021-03-12 NOTE — Progress Notes (Signed)
Met with patient and his ex-wife today at his initial medical oncology consult with Dr. [name].  I introduced myself and explained my role.  Patient reports approximate 60 pound weight loss in 2 months, drinking some Ensure at home however financially cannot afford to purchase all food items he thinks he can tolerate due to his income.  I have supplied him with a case of Ensure Plus today.  A referral to nutrition has been made.  I am also referring to CSW today.  Currently his ex-wife can provide transportation to his appointments.

## 2021-03-12 NOTE — Telephone Encounter (Signed)
Scheduled per referral. Called and spoke with pt, pt informed me to call [name] to get his appts scheduled. Called and spoke with [name] confirmed 6/8 appt.

## 2021-03-16 ENCOUNTER — Other Ambulatory Visit: Payer: Self-pay | Admitting: Hematology

## 2021-03-16 ENCOUNTER — Encounter: Payer: Self-pay | Admitting: *Deleted

## 2021-03-16 MED ORDER — TRAMADOL HCL 50 MG PO TABS: 50.0000 mg | ORAL_TABLET | Freq: Three times a day (TID) | ORAL | 0 refills | Status: DC | PRN

## 2021-03-16 NOTE — Progress Notes (Signed)
[place] Work  Clinical Social Work received referral from medical oncology for [employer] of Attorney and Living Will.  CSW contacted patients ex-wife per request to discuss Advance directives.  CSW provided information on the Advance Directives clinic at [facility].  Patient is scheduled for the 04/09/21 clinic.  CSW provided contact information and encouraged patient to call with questions or concerns.   Johnnye Lana, MSW, LCSW, OSW-C Clinical Social Worker [facility] [phone]

## 2021-03-16 NOTE — Progress Notes (Signed)
Patient's friend [name] calls asking if Dr. Burr Medico could prescribe something for pain.  He currently has pain all the time but especially with certain movements and deep breaths.  He uses the [employer] on file in the chart.

## 2021-03-17 ENCOUNTER — Other Ambulatory Visit: Payer: Self-pay

## 2021-03-17 NOTE — Progress Notes (Signed)
Emailed [place] Surgical [facility]cology coordinator with referral for surgical consideration for hepatocellular carcinoma, first available.

## 2021-03-17 NOTE — Progress Notes (Signed)
The proposed treatment discussed in conference is for discussion purposes only and is not a binding recommendation.  The patients have not been physically examined, or presented with their treatment options.  Therefore, final treatment plans cannot be decided.   

## 2021-03-17 NOTE — Progress Notes (Signed)
Spoke with [name] (patient's ex-wife) regarding outcome of Tumor Board discussion this morning.  Explained the recommendation of the surgeons here is to refer him to [facility] for surgical consideration.  I explained this is a big surgery and if it is determined that he is not a surgical candidate then we can consider referral to IR for procedures.  She verbalized an understanding and desires to proceed with surgical referral.

## 2021-03-24 ENCOUNTER — Encounter: Payer: Self-pay | Admitting: Nutrition

## 2021-03-24 ENCOUNTER — Inpatient Hospital Stay: Payer: Medicare Other | Attending: Hematology | Admitting: Nutrition

## 2021-03-24 ENCOUNTER — Encounter: Payer: Self-pay | Admitting: General Practice

## 2021-03-24 NOTE — Progress Notes (Signed)
Patient did not show up for nutrition appointment. 

## 2021-03-24 NOTE — Progress Notes (Signed)
[place] CSW Progress Notes  Request received from [place] to meet w patient today after his nutrition appointment to discuss [employer] and any other options for supportive resources.  Patient did not show for nutrition appointment.  Called patient.  He is being referred to [facility] for surgery and may/may not return to [facility].  He expressed that he is current on his expenses and has no needs at this time.  He does receive psychosocial support from his ex-wife.  No needs at this time, encouraged him to contact CSW w needs/concerns when he returns to [facility] if needs arise.  Edwyna Shell, LCSW Clinical Social Worker Phone:  [phone]

## 2021-03-25 ENCOUNTER — Telehealth: Payer: Self-pay | Admitting: Hematology

## 2021-03-25 NOTE — Telephone Encounter (Signed)
Scheduled appointment per 06/09 sch msg. Patient is aware. 

## 2021-04-07 ENCOUNTER — Inpatient Hospital Stay: Payer: Medicare Other | Admitting: Nutrition

## 2021-04-07 ENCOUNTER — Other Ambulatory Visit: Payer: Self-pay

## 2021-04-07 NOTE — Progress Notes (Signed)
75 year old male diagnosed with hepatocellular cancer followed by Dr. Burr Medico.  Past medical history includes depression and parasite infestation.  Medications include vitamin C, multivitamin, Senokot, vitamin B12.  Labs include glucose 107 on May 23.  Height: 5 feet 5 inches. Weight: 128.6 pounds improved from 123 pounds. Usual body weight: 150-155 pounds per patient BMI: 21.4. ECOG: 2.  Patient arrives to nutrition consult with his friend Prentiss Bells.  He currently feels well and strong.  He has a good appetite.  He has been trying to eat more food more often.  Breakfast typically is coffee and 2 toast with butter and oatmeal.  Lunch consists of a sandwich made out of salami with cheese or tuna with water to drink.  Dinner is usually meat like steak, with rice and vegetables.  Reports drinking 2 Ensure Plus a day.  He continues to exercise and walk on a regular basis.  He is interested in information on how he can eat to promote weight gain.  Nutrition diagnosis: Food and nutrition related knowledge deficit related to hepatocellular cancer and associated treatments as evidenced by no prior need for nutrition related information.  Intervention: Educated patient on strategies for increasing calories and protein in small frequent meals and snacks. Provided nutrition fact sheets on high-calorie high-protein foods and snacks. Continue Ensure Plus twice daily. Provided 1 complementary case of Ensure Plus. Continue activity per MD.  Monitoring, evaluation, goals: Patient will tolerate increased calories and protein to promote continued weight gain.  Next visit: To be scheduled with upcoming treatment as needed.  **Disclaimer: This note was dictated with voice recognition software. Similar sounding words can inadvertently be transcribed and this note may contain transcription errors which may not have been corrected upon publication of note.**

## 2021-04-09 ENCOUNTER — Inpatient Hospital Stay: Payer: Medicare Other | Admitting: General Practice

## 2021-04-09 ENCOUNTER — Encounter: Payer: Self-pay | Admitting: General Practice

## 2021-04-09 ENCOUNTER — Telehealth: Payer: Self-pay | Admitting: Hematology

## 2021-04-09 ENCOUNTER — Other Ambulatory Visit: Payer: Self-pay

## 2021-04-09 NOTE — Telephone Encounter (Signed)
Scheduled appt per 6/23 sch msg. Called [name], per sch msg. No answer. Left msg with appt date and time.

## 2021-04-09 NOTE — Progress Notes (Signed)
[place] Social Work  Clinical Social Work was referred by [facility]  to review and complete healthcare advance directives.  Clinical Social Worker met with patient and Chaplain Lorrin Jackson in [place]  The patient designated [name] Bravo as their primary healthcare agent and no one as their secondary agent.  Patient also completed healthcare living will.    Clinical Social Worker notarized documents and made copies for patient/family. Clinical Social Worker will send documents to medical records to be scanned into patient's chart. Clinical Social Worker encouraged patient/family to contact with any additional questions or concerns.  Edwyna Shell, LCSW Clinical Social Worker Phone:  [phone]

## 2021-04-16 ENCOUNTER — Inpatient Hospital Stay: Payer: Medicare Other | Attending: Hematology | Admitting: Hematology

## 2021-04-16 ENCOUNTER — Other Ambulatory Visit: Payer: Self-pay

## 2021-04-16 ENCOUNTER — Encounter: Payer: Self-pay | Admitting: Hematology

## 2021-04-16 VITALS — BP 142/92 | HR 85 | Temp 98.7°F | Resp 18 | Wt 130.2 lb

## 2021-04-16 DIAGNOSIS — Z5112 Encounter for antineoplastic immunotherapy: Secondary | ICD-10-CM | POA: Diagnosis not present

## 2021-04-16 DIAGNOSIS — C220 Liver cell carcinoma: Secondary | ICD-10-CM | POA: Diagnosis present

## 2021-04-16 DIAGNOSIS — R5383 Other fatigue: Secondary | ICD-10-CM | POA: Insufficient documentation

## 2021-04-16 DIAGNOSIS — R634 Abnormal weight loss: Secondary | ICD-10-CM | POA: Diagnosis not present

## 2021-04-16 DIAGNOSIS — B1920 Unspecified viral hepatitis C without hepatic coma: Secondary | ICD-10-CM | POA: Diagnosis not present

## 2021-04-16 NOTE — Progress Notes (Signed)
START OFF PATHWAY REGIMEN - Other   [id number]:Atezolizumab 1,200 mg IV D1 + Bevacizumab 15 mg/kg IV D1 q21 Days:   A cycle is every 21 Days:     Atezolizumab      Bevacizumab-xxxx   **Always confirm dose/schedule in your pharmacy ordering system**  Patient Characteristics: Intent of Therapy: Non-Curative / Palliative Intent, Discussed with Patient 

## 2021-04-16 NOTE — Progress Notes (Signed)
[name]   Telephone:([phone]) [phone] Fax:(336) [phone]   Clinic Follow up Note   Patient Care Team: Pcp, No as PCP - General [name], MD as Consulting Physician (Hematology and Oncology) [name], RN (Inactive) as Oncology Nurse Navigator  Date of Service:  04/16/2021  CHIEF COMPLAINT: f/u of liver cancer  SUMMARY OF ONCOLOGIC HISTORY: Oncology History Overview Note  Cancer Staging Hepatocellular carcinoma [facility]) Staging form: Liver, AJCC 8th Edition - Clinical stage from 03/08/2021: Stage IIIA (cT3, cN0, cM0) - Signed by [name], MD on 03/11/2021 Stage prefix: Initial diagnosis Histologic grade (G): G2 Histologic grading system: 4 grade system     Hepatocellular carcinoma ([place])   Initial Diagnosis   Liver mass, right lobe   03/05/2021 Imaging   CT CAP  IMPRESSION: 1. Right hepatic lobe masses which are suspicious for either metastatic disease or primary malignancy such as hepatocellular carcinoma. These are suboptimally evaluated on this noncontrast exam. Consider multidisciplinary oncology consultation with potential clinical strategies of tissue sampling versus PET to direct sampling. 2. No primary malignancy identified within the chest, abdomen, or pelvis, given limitation of noncontrast exam. 3. Left nephrolithiasis. 4. Coronary artery atherosclerosis. Aortic Atherosclerosis (ICD10-I70.0). 5. Esophageal air fluid level suggests dysmotility or gastroesophageal reflux.   03/06/2021 Imaging   MRI Liver  IMPRESSION: 9.5 cm heterogeneous hypovascular mass in anterior right hepatic lobe, and 1.3 cm hypervascular mass in the posterior right hepatic lobe. Differential diagnosis includes liver metastases and multifocal hepatocellular carcinoma. Suggest correlation with serum AFP level, and possible tissue sampling.   No evidence of abdominal metastatic disease.   03/08/2021 Initial Biopsy   A. LIVER, RIGHT, BIOPSY:  - Hepatocellular  carcinoma.   COMMENT:  The hepatocellular carcinoma is moderately differentiated and associated with areas of necrosis.  Immunohistochemistry shows positivity with glypican-3 and weak staining with HepPar 1.  The tumor is negative with arginase 1, alpha-fetoprotein, CDX2, cytokeratin 7, cytokeratin 20, cytokeratin AE1/AE3, PSA, prostein and MOC31.  Polyclonal CEA shows focal canalicular pattern.  The morphology and immunophenotype are consistent with hepatocellular carcinoma.    03/08/2021 Cancer Staging   Staging form: Liver, AJCC 8th Edition - Clinical stage from 03/08/2021: Stage IIIA (cT3, cN0, cM0) - Signed by [name], MD on 03/11/2021  Stage prefix: Initial diagnosis  Histologic grade (G): G2  Histologic grading system: 4 grade system    04/26/2021 -  Chemotherapy    Patient is on Treatment Plan: LUNG ATEZOLIZUMAB + BEVACIZUMAB Q21D MAINTENANCE          CURRENT THERAPY:  PENDING atezolizumab and bevacizumab week of 04/26/21  INTERVAL HISTORY:  Shawn Bailey is here for a follow up of liver cancer. He was last seen by me on 03/11/21 in consultation. He presents to the clinic accompanied by his ex-wife, Shawn Bailey, and her husband. He speaks fairly good [place], but Shawn Bailey served as our interpreter today so he could better understand the medical terms and explanations. Shawn Bailey relayed their discussion with Dr. [name]-- surgery was not recommended due to the proximity of nearby vessels and the gallbladder. He is doing well today and is without complaints.  All other systems were reviewed with the patient and are negative.  MEDICAL HISTORY:  Past Medical History:  Diagnosis Date   Depression    Parasite infestation     SURGICAL HISTORY: Past Surgical History:  Procedure Laterality Date   NO PAST SURGERIES      I have reviewed the social history and family history with the patient  and they are unchanged from previous note.  ALLERGIES:  has No Known  Allergies.  MEDICATIONS:  Current Outpatient Medications  Medication Sig Dispense Refill   Ascorbic Acid (VITAMIN C) 100 MG tablet Take 100 mg by mouth daily.     Multiple Vitamin (MULTIVITAMIN WITH MINERALS) TABS tablet Take 1 tablet by mouth daily.     polyethylene glycol (MIRALAX / GLYCOLAX) 17 g packet Take 17 g by mouth daily as needed for mild constipation. 14 each 0   senna (SENOKOT) 8.6 MG TABS tablet Take 1 tablet (8.6 mg total) by mouth 2 (two) times daily. 120 tablet 0   traMADol (ULTRAM) 50 MG tablet Take 1 tablet (50 mg total) by mouth every 8 (eight) hours as needed. 20 tablet 0   vitamin B-12 (CYANOCOBALAMIN) 100 MCG tablet Take 100 mcg by mouth daily.     No current facility-administered medications for this visit.    PHYSICAL EXAMINATION: ECOG PERFORMANCE STATUS: 1 - Symptomatic but completely ambulatory  Vitals:   04/16/21 1407  BP: (!) 142/92  Pulse: 85  Resp: 18  Temp: 98.7 F (37.1 C)  SpO2: 98%   Filed Weights   04/16/21 1407  Weight: 130 lb 3.2 oz (59.1 kg)   Due to COVID19 we will limit examination to appearance. Patient had no complaints.  GENERAL:alert, no distress and comfortable SKIN: skin color normal, no rashes or significant lesions EYES: normal, Conjunctiva are pink and non-injected, sclera clear  NEURO: alert & oriented x 3 with fluent speech  LABORATORY DATA:  I have reviewed the data as listed CBC Latest Ref Rng & Units 03/08/2021 03/05/2021 02/08/2016  WBC 4.0 - 10.5 K/uL 7.3 5.6 4.8  Hemoglobin 13.0 - 17.0 g/dL 15.1 13.7 14.0  Hematocrit 39.0 - 52.0 % 45.4 39.4 39.4  Platelets 150 - 400 K/uL 133(L) 130(L) 101(L)     CMP Latest Ref Rng & Units 03/08/2021 03/07/2021 03/06/2021  Glucose 70 - 99 mg/dL 107(H) 99 97  BUN 8 - 23 mg/dL 23 21 15   Creatinine 0.61 - 1.24 mg/dL 1.00 0.70 0.63  Sodium 135 - 145 mmol/L 137 135 135  Potassium 3.5 - 5.1 mmol/L 4.2 3.9 3.9  Chloride 98 - 111 mmol/L 99 102 100  CO2 22 - 32 mmol/L 32 27 28  Calcium  8.9 - 10.3 mg/dL 9.9 9.8 9.4  Total Protein 6.5 - 8.1 g/dL 8.7(H) 7.7 7.6  Total Bilirubin 0.3 - 1.2 mg/dL 0.5 0.5 0.7  Alkaline Phos 38 - 126 U/L 59 53 48  AST 15 - 41 U/L 127(H) 111(H) 96(H)  ALT 0 - 44 U/L 108(H) 93(H) 81(H)      RADIOGRAPHIC STUDIES: I have personally reviewed the radiological images as listed and agreed with the findings in the report. No results found.   ASSESSMENT & PLAN:  Antony Sian is a 75 y.o. male with   1.  Multifocal hepatocellular carcinoma, cT3N0M, stage IIIA -Liver MRI 03/06/21 showed a large 9.5 cm and a 1.3 cm liver mass is seen in right lobe, biopsy 03/08/21 confirmed moderately differentiated hepatocellular carcinoma.  No nodal or distant metastasis on CT 03/05/21. -This is likely related to his HCV infection -He is not a candidate for liver transplant. Given the large size tumor and multifocal disease, I suspect that this is may not be resectable. I referred him to Dr. [name] at [facility] for a surgical opinion. He did not recommend surgery given the location and extent of disease. -Given his large tumor burden, and symptomatic  disease, I recommend both liver targeted therapy and systemic therapy. I will start him on first-line atezolizumab and bevacizumab, while he is awaiting for liver embolization. --Chemotherapy consent: Side effects including but does not not limited to, fatigue, diarrhea, skin rash, arthralgia, pneumonitis, colitis, thyroid disorder and other endocrine disorder, hypertension, proteinuria, small risk of thrombosis and hemorrhage, were discussed with patient in great detail. he agrees to proceed. -We discussed the goal of therapy is palliative, to prolong his life and improve his quality of life -He does not have significant portal hypertension or varices on recent imaging study, okay to proceed bevacizumab, but I will refer him to GI for no urgent endoscopy. -I reviewed that he will receive systemic therapy every 3 weeks.  He  notes he does not take any pills, and prefers not to, but is agreeable if chemo causes intolerable side effects. This will be further explained in our chemotherapy education class. I asked Shawn Bailey to accompany him, she plans to. She will be his driver for treatment. -He is agreeable to treatment and ready to start. I anticipate beginning treatment the week of 04/26/21.   2. Untreated HCV  -During 02/2021 Hospitalization, labs showed HIV non reactive and hepatitis panel hep C ab positive, he was not aware that he has HCV infection -due to his recent [facility] diagnosis, will hold of HCV treatment for now -he does not see any doctors regularly. I will refer him to the liver clinic for evaluation of his cancer, HCV, and for endoscopy when needed.   3. Weight loss and fatigue -Secondary to liver cancer -He will follow with our nutritionist, whom he most recently saw 04/07/21     PLAN:  -schedule him for chemo education class next week -plan to start him on first-line systemic treatment atezolizumab and bevacizumab the week of 04/26/21, I will see him prior to his first treatment. -Refer to GI for repeat disease management, and EGD -Refer patient to interventional radiology to consider liver targeted therapy for [facility]    No problem-specific Assessment & Plan notes found for this encounter.   Orders Placed This Encounter  Procedures   CBC with Differential ([place] Only)    Standing Status:   Standing    Number of Occurrences:   30    Standing Expiration Date:   04/16/2022   CMP ([place] only)    Standing Status:   Standing    Number of Occurrences:   30    Standing Expiration Date:   04/16/2022   AFP tumor marker    Standing Status:   Standing    Number of Occurrences:   30    Standing Expiration Date:   04/16/2022   Ambulatory referral to Gastroenterology    Referral Priority:   Routine    Referral Type:   Consultation    Referral Reason:   Specialty Services Required    Number of  Visits Requested:   1   Ambulatory referral to Interventional Radiology    Referral Priority:   Routine    Referral Type:   Consultation    Referral Reason:   Specialty Services Required    Requested Specialty:   Interventional Radiology    Number of Visits Requested:   1    All questions were answered. The patient knows to call the clinic with any problems, questions or concerns. No barriers to learning was detected. The total time spent in the appointment was 40 minutes.     [name], MD 04/16/2021  I, Wilburn Mylar, am acting as scribe for [name], MD.   I have reviewed the above documentation for accuracy and completeness, and I agree with the above.

## 2021-04-20 ENCOUNTER — Telehealth: Payer: Self-pay | Admitting: Physician Assistant

## 2021-04-20 NOTE — Telephone Encounter (Signed)
Schper 07/1 los, left message with caretaker, pt aware

## 2021-04-21 NOTE — Progress Notes (Signed)
Pharmacist Chemotherapy Monitoring - Initial Assessment    Anticipated start date: 04/28/21   The following has been reviewed per standard work regarding the patient's treatment regimen: The patient's diagnosis, treatment plan and drug doses, and organ/hematologic function Lab orders and baseline tests specific to treatment regimen  The treatment plan start date, drug sequencing, and pre-medications Prior authorization status  Patient's documented medication list, including drug-drug interaction screen and prescriptions for anti-emetics and supportive care specific to the treatment regimen The drug concentrations, fluid compatibility, administration routes, and timing of the medications to be used The patient's access for treatment and lifetime cumulative dose history, if applicable  The patient's medication allergies and previous infusion related reactions, if applicable   Changes made to treatment plan:  N/A  Follow up needed:  N/A   Larene [place], [place], 04/21/2021  12:25 PM

## 2021-04-22 ENCOUNTER — Other Ambulatory Visit: Payer: Self-pay | Admitting: Hematology

## 2021-04-22 ENCOUNTER — Telehealth: Payer: Self-pay | Admitting: Hematology

## 2021-04-22 ENCOUNTER — Telehealth: Payer: Self-pay

## 2021-04-22 MED ORDER — HYDROCODONE-ACETAMINOPHEN 5-300 MG PO TABS: 1.0000 | ORAL_TABLET | Freq: Three times a day (TID) | ORAL | 0 refills | Status: DC | PRN

## 2021-04-22 NOTE — Telephone Encounter (Signed)
I called patient's friend/ex-wife Shawn Bailey back, and called in hydrocodone/acetaminophen 5/300 mg every 8 hours as needed for pain to his pharmacy.  I reviewed management of constipation from narcotics, and other side effects.  She will pick up the medicine and explained to patient.  Patient is scheduled to have chemo class tomorrow, and start first treatment next week.    [name]  04/22/2021

## 2021-04-22 NOTE — Telephone Encounter (Signed)
This nurse spoke with patients care giver who stated that patient is complaining of bad abdominal pain.  That has caused his appetite to decrease.  Describes the pain as a burning pain and the Tramadol is not effective.  Patient would like to know if he can have something else for the pain.  MD made aware.

## 2021-04-23 ENCOUNTER — Other Ambulatory Visit: Payer: Self-pay

## 2021-04-23 ENCOUNTER — Inpatient Hospital Stay: Payer: Medicare Other

## 2021-04-23 DIAGNOSIS — C220 Liver cell carcinoma: Secondary | ICD-10-CM

## 2021-04-23 DIAGNOSIS — Z5112 Encounter for antineoplastic immunotherapy: Secondary | ICD-10-CM | POA: Diagnosis not present

## 2021-04-23 LAB — CBC WITH DIFFERENTIAL (CANCER CENTER ONLY)
Abs Immature Granulocytes: 0.02 10*3/uL (ref 0.00–0.07)
Basophils Absolute: 0 10*3/uL (ref 0.0–0.1)
Basophils Relative: 0 %
Eosinophils Absolute: 0 10*3/uL (ref 0.0–0.5)
Eosinophils Relative: 1 %
HCT: 38.7 % — ABNORMAL LOW (ref 39.0–52.0)
Hemoglobin: 13.4 g/dL (ref 13.0–17.0)
Immature Granulocytes: 0 %
Lymphocytes Relative: 33 %
Lymphs Abs: 1.7 10*3/uL (ref 0.7–4.0)
MCH: 31.9 pg (ref 26.0–34.0)
MCHC: 34.6 g/dL (ref 30.0–36.0)
MCV: 92.1 fL (ref 80.0–100.0)
Monocytes Absolute: 0.6 10*3/uL (ref 0.1–1.0)
Monocytes Relative: 13 %
Neutro Abs: 2.6 10*3/uL (ref 1.7–7.7)
Neutrophils Relative %: 53 %
Platelet Count: 118 10*3/uL — ABNORMAL LOW (ref 150–400)
RBC: 4.2 MIL/uL — ABNORMAL LOW (ref 4.22–5.81)
RDW: 12.4 % (ref 11.5–15.5)
WBC Count: 5 10*3/uL (ref 4.0–10.5)
nRBC: 0 % (ref 0.0–0.2)

## 2021-04-23 LAB — CMP (CANCER CENTER ONLY)
ALT: 122 U/L — ABNORMAL HIGH (ref 0–44)
AST: 141 U/L — ABNORMAL HIGH (ref 15–41)
Albumin: 3.2 g/dL — ABNORMAL LOW (ref 3.5–5.0)
Alkaline Phosphatase: 74 U/L (ref 38–126)
Anion gap: 5 (ref 5–15)
BUN: 13 mg/dL (ref 8–23)
CO2: 26 mmol/L (ref 22–32)
Calcium: 9.5 mg/dL (ref 8.9–10.3)
Chloride: 102 mmol/L (ref 98–111)
Creatinine: 0.71 mg/dL (ref 0.61–1.24)
GFR, Estimated: >60 mL/min (ref >60)
Glucose, Bld: 82 mg/dL (ref 70–99)
Potassium: 4 mmol/L (ref 3.5–5.1)
Sodium: 133 mmol/L — ABNORMAL LOW (ref 135–145)
Total Bilirubin: 0.9 mg/dL (ref 0.3–1.2)
Total Protein: 8.1 g/dL (ref 6.5–8.1)

## 2021-04-24 LAB — AFP TUMOR MARKER: AFP, Serum, Tumor Marker: 2353 ng/mL — ABNORMAL HIGH (ref 0.0–8.4)

## 2021-04-27 ENCOUNTER — Other Ambulatory Visit: Payer: Self-pay | Admitting: Physician Assistant

## 2021-04-27 DIAGNOSIS — C220 Liver cell carcinoma: Secondary | ICD-10-CM

## 2021-04-28 ENCOUNTER — Other Ambulatory Visit: Payer: Self-pay | Admitting: Nurse Practitioner

## 2021-04-28 ENCOUNTER — Inpatient Hospital Stay: Payer: Medicare Other

## 2021-04-28 ENCOUNTER — Inpatient Hospital Stay (HOSPITAL_BASED_OUTPATIENT_CLINIC_OR_DEPARTMENT_OTHER): Payer: Medicare Other | Admitting: Physician Assistant

## 2021-04-28 ENCOUNTER — Other Ambulatory Visit: Payer: Self-pay

## 2021-04-28 ENCOUNTER — Ambulatory Visit: Payer: Medicare Other | Admitting: Nurse Practitioner

## 2021-04-28 VITALS — BP 167/78

## 2021-04-28 VITALS — BP 153/81 | HR 80 | Temp 98.8°F | Resp 19 | Ht 65.0 in | Wt 130.5 lb

## 2021-04-28 DIAGNOSIS — C220 Liver cell carcinoma: Secondary | ICD-10-CM

## 2021-04-28 DIAGNOSIS — Z5112 Encounter for antineoplastic immunotherapy: Secondary | ICD-10-CM | POA: Diagnosis not present

## 2021-04-28 LAB — CMP (CANCER CENTER ONLY)
ALT: 111 U/L — ABNORMAL HIGH (ref 0–44)
AST: 126 U/L — ABNORMAL HIGH (ref 15–41)
Albumin: 3.2 g/dL — ABNORMAL LOW (ref 3.5–5.0)
Alkaline Phosphatase: 71 U/L (ref 38–126)
Anion gap: 6 (ref 5–15)
BUN: 14 mg/dL (ref 8–23)
CO2: 26 mmol/L (ref 22–32)
Calcium: 9.3 mg/dL (ref 8.9–10.3)
Chloride: 104 mmol/L (ref 98–111)
Creatinine: 0.75 mg/dL (ref 0.61–1.24)
GFR, Estimated: >60 mL/min (ref >60)
Glucose, Bld: 105 mg/dL — ABNORMAL HIGH (ref 70–99)
Potassium: 3.9 mmol/L (ref 3.5–5.1)
Sodium: 136 mmol/L (ref 135–145)
Total Bilirubin: 0.8 mg/dL (ref 0.3–1.2)
Total Protein: 7.9 g/dL (ref 6.5–8.1)

## 2021-04-28 LAB — CBC WITH DIFFERENTIAL (CANCER CENTER ONLY)
Abs Immature Granulocytes: 0.02 10*3/uL (ref 0.00–0.07)
Basophils Absolute: 0 10*3/uL (ref 0.0–0.1)
Basophils Relative: 1 %
Eosinophils Absolute: 0.1 10*3/uL (ref 0.0–0.5)
Eosinophils Relative: 2 %
HCT: 36.9 % — ABNORMAL LOW (ref 39.0–52.0)
Hemoglobin: 12.8 g/dL — ABNORMAL LOW (ref 13.0–17.0)
Immature Granulocytes: 0 %
Lymphocytes Relative: 39 %
Lymphs Abs: 2.1 10*3/uL (ref 0.7–4.0)
MCH: 31.9 pg (ref 26.0–34.0)
MCHC: 34.7 g/dL (ref 30.0–36.0)
MCV: 92 fL (ref 80.0–100.0)
Monocytes Absolute: 0.6 10*3/uL (ref 0.1–1.0)
Monocytes Relative: 11 %
Neutro Abs: 2.6 10*3/uL (ref 1.7–7.7)
Neutrophils Relative %: 47 %
Platelet Count: 105 10*3/uL — ABNORMAL LOW (ref 150–400)
RBC: 4.01 MIL/uL — ABNORMAL LOW (ref 4.22–5.81)
RDW: 12.3 % (ref 11.5–15.5)
WBC Count: 5.4 10*3/uL (ref 4.0–10.5)
nRBC: 0 % (ref 0.0–0.2)

## 2021-04-28 LAB — TOTAL PROTEIN, URINE DIPSTICK: Protein, ur: NEGATIVE mg/dL

## 2021-04-28 MED ORDER — HYDROCODONE-ACETAMINOPHEN 5-325 MG PO TABS: 1.0000 | ORAL_TABLET | Freq: Four times a day (QID) | ORAL | 0 refills | Status: DC | PRN

## 2021-04-28 MED ORDER — SODIUM CHLORIDE 0.9 % IV SOLN
1200.0000 mg | Freq: Once | INTRAVENOUS | Status: AC
  Administered 2021-04-28: 1200 mg via INTRAVENOUS
  Filled 2021-04-28: qty 20

## 2021-04-28 MED ORDER — SODIUM CHLORIDE 0.9 % IV SOLN
15.0000 mg/kg | Freq: Once | INTRAVENOUS | Status: AC
  Administered 2021-04-28: 900 mg via INTRAVENOUS
  Filled 2021-04-28: qty 4

## 2021-04-28 MED ORDER — ONDANSETRON HCL 8 MG PO TABS: 8.0000 mg | ORAL_TABLET | Freq: Three times a day (TID) | ORAL | 0 refills | Status: DC | PRN

## 2021-04-28 MED ORDER — SODIUM CHLORIDE 0.9 % IV SOLN
Freq: Once | INTRAVENOUS | Status: AC
  Filled 2021-04-28: qty 250

## 2021-04-28 MED ORDER — PROCHLORPERAZINE MALEATE 10 MG PO TABS: 10.0000 mg | ORAL_TABLET | Freq: Four times a day (QID) | ORAL | 0 refills | Status: DC | PRN

## 2021-04-28 NOTE — Progress Notes (Signed)
Shawn Bailey   [phone] [phone]   Clinic Follow up Note   Patient Care Team: Pcp, No as PCP - General [name], MD as Consulting Physician (Hematology and Oncology) [name], RN (Inactive) as Oncology Nurse Navigator  Date of Service:  04/29/2021  CHIEF COMPLAINT: f/u of liver cancer  SUMMARY OF ONCOLOGIC HISTORY: Oncology History Overview Note  Cancer Staging Hepatocellular carcinoma [facility]) Staging form: Liver, AJCC 8th Edition - Clinical stage from 03/08/2021: Stage IIIA (cT3, cN0, cM0) - Signed by [name], MD on 03/11/2021 Stage prefix: Initial diagnosis Histologic grade (G): G2 Histologic grading system: 4 grade system     Hepatocellular carcinoma ([place])   Initial Diagnosis   Liver mass, right lobe   03/05/2021 Imaging   CT CAP  IMPRESSION: 1. Right hepatic lobe masses which are suspicious for either metastatic disease or primary malignancy such as hepatocellular carcinoma. These are suboptimally evaluated on this noncontrast exam. Consider multidisciplinary oncology consultation with potential clinical strategies of tissue sampling versus PET to direct sampling. 2. No primary malignancy identified within the chest, abdomen, or pelvis, given limitation of noncontrast exam. 3. Left nephrolithiasis. 4. Coronary artery atherosclerosis. Aortic Atherosclerosis (ICD10-I70.0). 5. Esophageal air fluid level suggests dysmotility or gastroesophageal reflux.   03/06/2021 Imaging   MRI Liver  IMPRESSION: 9.5 cm heterogeneous hypovascular mass in anterior right hepatic lobe, and 1.3 cm hypervascular mass in the posterior right hepatic lobe. Differential diagnosis includes liver metastases and multifocal hepatocellular carcinoma. Suggest correlation with serum AFP level, and possible tissue sampling.   No evidence of abdominal metastatic disease.   03/08/2021 Initial Biopsy   A. LIVER, RIGHT, BIOPSY:  - Hepatocellular  carcinoma.   COMMENT:  The hepatocellular carcinoma is moderately differentiated and associated with areas of necrosis.  Immunohistochemistry shows positivity with glypican-3 and weak staining with HepPar 1.  The tumor is negative with arginase 1, alpha-fetoprotein, CDX2, cytokeratin 7, cytokeratin 20, cytokeratin AE1/AE3, PSA, prostein and MOC31.  Polyclonal CEA shows focal canalicular pattern.  The morphology and immunophenotype are consistent with hepatocellular carcinoma.    03/08/2021 Cancer Staging   Staging form: Liver, AJCC 8th Edition - Clinical stage from 03/08/2021: Stage IIIA (cT3, cN0, cM0) - Signed by [name], MD on 03/11/2021  Stage prefix: Initial diagnosis  Histologic grade (G): G2  Histologic grading system: 4 grade system    04/28/2021 -  Chemotherapy    Patient is on Treatment Plan: LUNG ATEZOLIZUMAB + BEVACIZUMAB Q21D MAINTENANCE          CURRENT THERAPY:  Atezolizumab and bevacizumab q 3 weeks  INTERVAL HISTORY:  Shawn Bailey is here for a follow up of liver cancer. He is scheduled to start treatment with Atezolizumab and Bevacizumab today. He presents to the clinic accompanied by his son. He speaks fairly good [place], but his son served as our interpreter today so he could better understand the medical terms and explanations.   Mr. Bontempo reports that his energy levels have mildly decreased. He tries to complete all his ADLs on his own. His appetite is stable without any weight changes. He denies any nausea, vomiting. He does not report of right sided abdominal pain today and is trying to avoid taking prescribed pain medication. Patient's bowel movements are more frequent after taking miralax and senna. He denies any easy bruising or signs of bleeding. Patient denies any fevers, chills, night sweats, shortness of breath, chest pain or cough. He has no other complaints.   All other systems  were reviewed with the patient and are negative.  MEDICAL  HISTORY:  Past Medical History:  Diagnosis Date   Depression    Parasite infestation     SURGICAL HISTORY: Past Surgical History:  Procedure Laterality Date   NO PAST SURGERIES      I have reviewed the social history and family history with the patient and they are unchanged from previous note.  ALLERGIES:  has No Known Allergies.  MEDICATIONS:  Current Outpatient Medications  Medication Sig Dispense Refill   Ascorbic Acid (VITAMIN C) 100 MG tablet Take 100 mg by mouth daily.     HYDROcodone-acetaminophen (NORCO) 5-325 MG tablet Take 1 tablet by mouth every 6 (six) hours as needed for moderate pain. 30 tablet 0   Multiple Vitamin (MULTIVITAMIN WITH MINERALS) TABS tablet Take 1 tablet by mouth daily.     ondansetron (ZOFRAN) 8 MG tablet Take 1 tablet (8 mg total) by mouth every 8 (eight) hours as needed for nausea or vomiting. 30 tablet 0   polyethylene glycol (MIRALAX / GLYCOLAX) 17 g packet Take 17 g by mouth daily as needed for mild constipation. 14 each 0   prochlorperazine (COMPAZINE) 10 MG tablet Take 1 tablet (10 mg total) by mouth every 6 (six) hours as needed for nausea or vomiting. 30 tablet 0   senna (SENOKOT) 8.6 MG TABS tablet Take 1 tablet (8.6 mg total) by mouth 2 (two) times daily. 120 tablet 0   vitamin B-12 (CYANOCOBALAMIN) 100 MCG tablet Take 100 mcg by mouth daily.     No current facility-administered medications for this visit.    PHYSICAL EXAMINATION: ECOG PERFORMANCE STATUS: 1 - Symptomatic but completely ambulatory  Vitals:   04/28/21 1217  BP: (!) 153/81  Pulse: 80  Resp: 19  Temp: 98.8 F (37.1 C)  SpO2: 100%   Filed Weights   04/28/21 1217  Weight: 130 lb 8 oz (59.2 kg)    GENERAL:alert, no distress and comfortable SKIN: skin color normal, no rashes or significant lesions EYES: normal, Conjunctiva are pink and non-injected, sclera clear  NEURO: alert & oriented x 3 with fluent speech LUNGS: Clear to auscultation. No wheezing or  crackles HEART: Regular rhythm and rate  ABDOMEN: No tenderness to palpation. No hepatosplenomegaly EXTREMITIES: No peripheral edema.   LABORATORY DATA:  I have reviewed the data as listed CBC Latest Ref Rng & Units 04/28/2021 04/23/2021 03/08/2021  WBC 4.0 - 10.5 K/uL 5.4 5.0 7.3  Hemoglobin 13.0 - 17.0 g/dL 12.8(L) 13.4 15.1  Hematocrit 39.0 - 52.0 % 36.9(L) 38.7(L) 45.4  Platelets 150 - 400 K/uL 105(L) 118(L) 133(L)     CMP Latest Ref Rng & Units 04/28/2021 04/23/2021 03/08/2021  Glucose 70 - 99 mg/dL 105(H) 82 107(H)  BUN 8 - 23 mg/dL 14 13 23   Creatinine 0.61 - 1.24 mg/dL 0.75 0.71 1.00  Sodium 135 - 145 mmol/L 136 133(L) 137  Potassium 3.5 - 5.1 mmol/L 3.9 4.0 4.2  Chloride 98 - 111 mmol/L 104 102 99  CO2 22 - 32 mmol/L 26 26 32  Calcium 8.9 - 10.3 mg/dL 9.3 9.5 9.9  Total Protein 6.5 - 8.1 g/dL 7.9 8.1 8.7(H)  Total Bilirubin 0.3 - 1.2 mg/dL 0.8 0.9 0.5  Alkaline Phos 38 - 126 U/L 71 74 59  AST 15 - 41 U/L 126(H) 141(H) 127(H)  ALT 0 - 44 U/L 111(H) 122(H) 108(H)      RADIOGRAPHIC STUDIES: I have personally reviewed the radiological images as listed and agreed with the  findings in the report. No results found.   ASSESSMENT & PLAN:  Gamaliel Charney is a 75 y.o. male with   1.  Multifocal hepatocellular carcinoma, cT3N0M, stage IIIA -Liver MRI 03/06/21 showed a large 9.5 cm and a 1.3 cm liver mass is seen in right lobe, biopsy 03/08/21 confirmed moderately differentiated hepatocellular carcinoma.  No nodal or distant metastasis on CT 03/05/21. -This is likely related to his HCV infection -He is not a candidate for liver transplant. Given the large size tumor and multifocal disease, I suspect that this is may not be resectable. I referred him to Dr. [name] at [facility] for a surgical opinion. He did not recommend surgery given the location and extent of disease. -Given his large tumor burden, and symptomatic disease, I recommend both liver targeted therapy and systemic therapy.  I will start him on first-line atezolizumab and bevacizumab, while he is awaiting for liver embolization. --Chemotherapy consent: Side effects including but does not not limited to, fatigue, diarrhea, skin rash, arthralgia, pneumonitis, colitis, thyroid disorder and other endocrine disorder, hypertension, proteinuria, small risk of thrombosis and hemorrhage, were discussed with patient in great detail. he agrees to proceed. -We discussed the goal of therapy is palliative, to prolong his life and improve his quality of life -He does not have significant portal hypertension or varices on recent imaging study, okay to proceed bevacizumab, but placed referral to GI for no urgent endoscopy. -Referral placed for interventional radiology to evaluate for liver directed therapies.    2. Untreated HCV  -During 02/2021 Hospitalization, labs showed HIV non reactive and hepatitis panel hep C ab positive, he was not aware that he has HCV infection -due to his recent [facility] diagnosis, will hold of HCV treatment for now -he does not see any doctors regularly. I will refer him to the liver clinic for evaluation of his cancer, HCV, and for endoscopy when needed.   3. Weight loss and fatigue -Secondary to liver cancer -He will follow with our nutritionist, whom he most recently saw 04/07/21     PLAN:  -Patient presents today, 04/28/2021 for Cycle 1 of atezolizumab and bevacizumab. -Labs from today were reviewed. No intervention required. Liver enzymes have slightly improved to AST 126 and ALT 111. Patient will proceed with treatment as planned. He will return in 3 weeks with office visit, labs and Cycle 2 of Atezolizumab/Bevacizumab.  -Sent prescription for zofran/compazine if patient develops nausea with treatment.  -Patient's son inquired about  patient getting another COVID booster. I advised patient to discuss booster versus Evusheld with Dr. Burr Medico at next visit.   No problem-specific Assessment & Plan notes found  for this encounter.   No orders of the defined types were placed in this encounter.   All questions were answered. The patient knows to call the clinic with any problems, questions or concerns.   I have spent a total of 30 minutes minutes of face-to-face and non-face-to-face time, preparing to see the patient, obtaining and/or reviewing separately obtained history, performing a medically appropriate examination, counseling and educating the patient, ordering medications, documenting clinical information in the electronic health record, and care coordination.   Lincoln Brigham PA-C Hematology and [place]

## 2021-04-28 NOTE — Patient Instructions (Signed)
[facility] ONCOLOGY  Discharge Instructions: Thank you for choosing [facility] to provide your oncology and hematology care.   If you have a lab appointment with the [facility], please go directly to the [facility] and check in at the registration area.   Wear comfortable clothing and clothing appropriate for easy access to any Portacath or PICC line.   We strive to give you quality time with your provider. You may need to reschedule your appointment if you arrive late (15 or more minutes).  Arriving late affects you and other patients whose appointments are after yours.  Also, if you miss three or more appointments without notifying the office, you may be dismissed from the clinic at the provider's discretion.      For prescription refill requests, have your pharmacy contact our office and allow 72 hours for refills to be completed.    Today you received the following chemotherapy and/or immunotherapy agents : Tecentriq, Bevacizumab     To help prevent nausea and vomiting after your treatment, we encourage you to take your nausea medication as directed.  BELOW ARE SYMPTOMS THAT SHOULD BE REPORTED IMMEDIATELY: *FEVER GREATER THAN 100.4 F (38 C) OR HIGHER *CHILLS OR SWEATING *NAUSEA AND VOMITING THAT IS NOT CONTROLLED WITH YOUR NAUSEA MEDICATION *UNUSUAL SHORTNESS OF BREATH *UNUSUAL BRUISING OR BLEEDING *URINARY PROBLEMS (pain or burning when urinating, or frequent urination) *BOWEL PROBLEMS (unusual diarrhea, constipation, pain near the anus) TENDERNESS IN MOUTH AND THROAT WITH OR WITHOUT PRESENCE OF ULCERS (sore throat, sores in mouth, or a toothache) UNUSUAL RASH, SWELLING OR PAIN  UNUSUAL VAGINAL DISCHARGE OR ITCHING   Items with * indicate a potential emergency and should be followed up as soon as possible or go to the Emergency Department if any problems should occur.  Please show the CHEMOTHERAPY ALERT CARD or IMMUNOTHERAPY ALERT CARD at  check-in to the Emergency Department and triage nurse.  Should you have questions after your visit or need to cancel or reschedule your appointment, please contact [facility]  Dept: [phone]  and follow the prompts.  Office hours are 8:00 a.m. to 4:30 p.m. Monday - Friday. Please note that voicemails left after 4:00 p.m. may not be returned until the following business day.  We are closed weekends and major holidays. You have access to a nurse at all times for urgent questions. Please call the main number to the clinic Dept: [phone] and follow the prompts.   For any non-urgent questions, you may also contact your provider using MyChart. We now offer e-Visits for anyone 51 and older to request care online for non-urgent symptoms. For details visit [email or web address].   Also download the MyChart app! Go to the app store, search "MyChart", open the app, select [facility], and log in with your MyChart username and password.  Due to Covid, a mask is required upon entering the hospital/clinic. If you do not have a mask, one will be given to you upon arrival. For doctor visits, patients may have 1 support person aged 82 or older with them. For treatment visits, patients cannot have anyone with them due to current Covid guidelines and our immunocompromised population.   Atezolizumab injection What is this medication? ATEZOLIZUMAB (a te zoe LIZ ue mab) is a monoclonal antibody. It is used to treat bladder cancer (urothelial cancer), liver cancer, lung cancer, andmelanoma. This medicine may be used for other purposes; ask your health care provider orpharmacist if you have  questions. COMMON BRAND NAME(S): Tecentriq What should I tell my care team before I take this medication? They need to know if you have any of these conditions: autoimmune diseases like Crohn's disease, ulcerative colitis, or lupus have had or planning to have an allogeneic stem cell transplant  (uses someone else's stem cells) history of organ transplant history of radiation to the chest nervous system problems like myasthenia gravis or Guillain-Barre syndrome an unusual or allergic reaction to atezolizumab, other medicines, foods, dyes, or preservatives pregnant or trying to get pregnant breast-feeding How should I use this medication? This medicine is for infusion into a vein. It is given by a health careprofessional in a hospital or clinic setting. A special MedGuide will be given to you before each treatment. Be sure to readthis information carefully each time. Talk to your pediatrician regarding the use of this medicine in children.Special care may be needed. Overdosage: If you think you have taken too much of this medicine contact apoison control center or emergency room at once. NOTE: This medicine is only for you. Do not share this medicine with others. What if I miss a dose? It is important not to miss your dose. Call your doctor or health careprofessional if you are unable to keep an appointment. What may interact with this medication? Interactions have not been studied. This list may not describe all possible interactions. Give your health care provider a list of all the medicines, herbs, non-prescription drugs, or dietary supplements you use. Also tell them if you smoke, drink alcohol, or use illegaldrugs. Some items may interact with your medicine. What should I watch for while using this medication? Your condition will be monitored carefully while you are receiving thismedicine. You may need blood work done while you are taking this medicine. Do not become pregnant while taking this medicine or for at least 5 months after stopping it. Women should inform their doctor if they wish to become pregnant or think they might be pregnant. There is a potential for serious side effects to an unborn child. Talk to your health care professional or pharmacist for more information. Do  not breast-feed an infant while taking this medicineor for at least 5 months after the last dose. What side effects may I notice from receiving this medication? Side effects that you should report to your doctor or health care professionalas soon as possible: allergic reactions like skin rash, itching or hives, swelling of the face, lips, or tongue black, tarry stools bloody or watery diarrhea breathing problems changes in vision chest pain or chest tightness chills facial flushing fever headache signs and symptoms of high blood sugar such as dizziness; dry mouth; dry skin; fruity breath; nausea; stomach pain; increased hunger or thirst; increased urination signs and symptoms of liver injury like dark yellow or brown urine; general ill feeling or flu-like symptoms; light-colored stools; loss of appetite; nausea; right upper belly pain; unusually weak or tired; yellowing of the eyes or skin stomach pain trouble passing urine or change in the amount of urine Side effects that usually do not require medical attention (report to yourdoctor or health care professional if they continue or are bothersome): bone pain cough diarrhea joint pain muscle pain muscle weakness swelling of arms or legs tiredness weight loss This list may not describe all possible side effects. Call your doctor for medical advice about side effects. You may report side effects to FDA at1-800-FDA-1088. Where should I keep my medication? This drug is given in a hospital or  clinic and will not be stored at home. NOTE: This sheet is a summary. It may not cover all possible information. If you have questions about this medicine, talk to your doctor, pharmacist, orhealth care provider.  2022 Elsevier/Gold Standard (2020-07-02 13:59:34)  Bevacizumab injection What is this medication? BEVACIZUMAB (be va SIZ yoo mab) is a monoclonal antibody. It is used to treatmany types of cancer. This medicine may be used for other  purposes; ask your health care provider orpharmacist if you have questions. COMMON BRAND NAME(S): Avastin, MVASI, Noah Charon What should I tell my care team before I take this medication? They need to know if you have any of these conditions: diabetes heart disease high blood pressure history of coughing up blood prior anthracycline chemotherapy (e.g., doxorubicin, daunorubicin, epirubicin) recent or ongoing radiation therapy recent or planning to have surgery stroke an unusual or allergic reaction to bevacizumab, hamster proteins, mouse proteins, other medicines, foods, dyes, or preservatives pregnant or trying to get pregnant breast-feeding How should I use this medication? This medicine is for infusion into a vein. It is given by a health careprofessional in a hospital or clinic setting. Talk to your pediatrician regarding the use of this medicine in children.Special care may be needed. Overdosage: If you think you have taken too much of this medicine contact apoison control center or emergency room at once. NOTE: This medicine is only for you. Do not share this medicine with others. What if I miss a dose? It is important not to miss your dose. Call your doctor or health careprofessional if you are unable to keep an appointment. What may interact with this medication? Interactions are not expected. This list may not describe all possible interactions. Give your health care provider a list of all the medicines, herbs, non-prescription drugs, or dietary supplements you use. Also tell them if you smoke, drink alcohol, or use illegaldrugs. Some items may interact with your medicine. What should I watch for while using this medication? Your condition will be monitored carefully while you are receiving this medicine. You will need important blood work and urine testing done while youare taking this medicine. This medicine may increase your risk to bruise or bleed. Call your doctor orhealth care  professional if you notice any unusual bleeding. Before having surgery, talk to your health care provider to make sure it is ok. This drug can increase the risk of poor healing of your surgical site or wound. You will need to stop this drug for 28 days before surgery. After surgery, wait at least 28 days before restarting this drug. Make sure the surgical site or wound is healed enough before restarting this drug. Talk to your health careprovider if questions. Do not become pregnant while taking this medicine or for 6 months after stopping it. Women should inform their doctor if they wish to become pregnant or think they might be pregnant. There is a potential for serious side effects to an unborn child. Talk to your health care professional or pharmacist for more information. Do not breast-feed an infant while taking this medicine andfor 6 months after the last dose. This medicine has caused ovarian failure in some women. This medicine may interfere with the ability to have a child. You should talk to your doctor orhealth care professional if you are concerned about your fertility. What side effects may I notice from receiving this medication? Side effects that you should report to your doctor or health care professionalas soon as possible: allergic reactions like skin  rash, itching or hives, swelling of the face, lips, or tongue chest pain or chest tightness chills coughing up blood high fever seizures severe constipation signs and symptoms of bleeding such as bloody or black, tarry stools; red or dark-brown urine; spitting up blood or brown material that looks like coffee grounds; red spots on the skin; unusual bruising or bleeding from the eye, gums, or nose signs and symptoms of a blood clot such as breathing problems; chest pain; severe, sudden headache; pain, swelling, warmth in the leg signs and symptoms of a stroke like changes in vision; confusion; trouble speaking or understanding; severe  headaches; sudden numbness or weakness of the face, arm or leg; trouble walking; dizziness; loss of balance or coordination stomach pain sweating swelling of legs or ankles vomiting weight gain Side effects that usually do not require medical attention (report to yourdoctor or health care professional if they continue or are bothersome): back pain changes in taste decreased appetite dry skin nausea tiredness This list may not describe all possible side effects. Call your doctor for medical advice about side effects. You may report side effects to FDA at1-800-FDA-1088. Where should I keep my medication? This drug is given in a hospital or clinic and will not be stored at home. NOTE: This sheet is a summary. It may not cover all possible information. If you have questions about this medicine, talk to your doctor, pharmacist, orhealth care provider.  2022 Elsevier/Gold Standard (2019-07-31 10:50:46)

## 2021-04-28 NOTE — Progress Notes (Signed)
error 

## 2021-05-19 ENCOUNTER — Encounter: Payer: Self-pay | Admitting: Hematology

## 2021-05-19 ENCOUNTER — Inpatient Hospital Stay: Payer: Medicare Other

## 2021-05-19 ENCOUNTER — Ambulatory Visit: Payer: Medicare Other

## 2021-05-19 ENCOUNTER — Inpatient Hospital Stay: Payer: Medicare Other | Admitting: Dietician

## 2021-05-19 ENCOUNTER — Inpatient Hospital Stay: Payer: Medicare Other | Attending: Hematology

## 2021-05-19 ENCOUNTER — Other Ambulatory Visit: Payer: Medicare Other

## 2021-05-19 ENCOUNTER — Other Ambulatory Visit: Payer: Self-pay

## 2021-05-19 ENCOUNTER — Other Ambulatory Visit (HOSPITAL_COMMUNITY): Payer: Self-pay

## 2021-05-19 ENCOUNTER — Inpatient Hospital Stay (HOSPITAL_BASED_OUTPATIENT_CLINIC_OR_DEPARTMENT_OTHER): Payer: Medicare Other | Admitting: Hematology

## 2021-05-19 VITALS — BP 155/96 | HR 87 | Temp 98.3°F | Resp 18 | Ht 65.0 in | Wt 126.5 lb

## 2021-05-19 VITALS — BP 154/79 | HR 83

## 2021-05-19 DIAGNOSIS — C220 Liver cell carcinoma: Secondary | ICD-10-CM

## 2021-05-19 DIAGNOSIS — Z5112 Encounter for antineoplastic immunotherapy: Secondary | ICD-10-CM | POA: Insufficient documentation

## 2021-05-19 DIAGNOSIS — L989 Disorder of the skin and subcutaneous tissue, unspecified: Secondary | ICD-10-CM | POA: Insufficient documentation

## 2021-05-19 DIAGNOSIS — B1920 Unspecified viral hepatitis C without hepatic coma: Secondary | ICD-10-CM | POA: Insufficient documentation

## 2021-05-19 DIAGNOSIS — R630 Anorexia: Secondary | ICD-10-CM | POA: Insufficient documentation

## 2021-05-19 DIAGNOSIS — R634 Abnormal weight loss: Secondary | ICD-10-CM | POA: Diagnosis not present

## 2021-05-19 DIAGNOSIS — I700 Atherosclerosis of aorta: Secondary | ICD-10-CM | POA: Diagnosis not present

## 2021-05-19 DIAGNOSIS — I2510 Atherosclerotic heart disease of native coronary artery without angina pectoris: Secondary | ICD-10-CM | POA: Diagnosis not present

## 2021-05-19 DIAGNOSIS — Z79899 Other long term (current) drug therapy: Secondary | ICD-10-CM | POA: Diagnosis not present

## 2021-05-19 DIAGNOSIS — N200 Calculus of kidney: Secondary | ICD-10-CM | POA: Insufficient documentation

## 2021-05-19 LAB — CBC WITH DIFFERENTIAL (CANCER CENTER ONLY)
Abs Immature Granulocytes: 0.02 10*3/uL (ref 0.00–0.07)
Basophils Absolute: 0 10*3/uL (ref 0.0–0.1)
Basophils Relative: 1 %
Eosinophils Absolute: 0.1 10*3/uL (ref 0.0–0.5)
Eosinophils Relative: 1 %
HCT: 39.4 % (ref 39.0–52.0)
Hemoglobin: 13.6 g/dL (ref 13.0–17.0)
Immature Granulocytes: 0 %
Lymphocytes Relative: 56 %
Lymphs Abs: 3.6 10*3/uL (ref 0.7–4.0)
MCH: 31.1 pg (ref 26.0–34.0)
MCHC: 34.5 g/dL (ref 30.0–36.0)
MCV: 90.2 fL (ref 80.0–100.0)
Monocytes Absolute: 0.9 10*3/uL (ref 0.1–1.0)
Monocytes Relative: 14 %
Neutro Abs: 1.8 10*3/uL (ref 1.7–7.7)
Neutrophils Relative %: 28 %
Platelet Count: 148 10*3/uL — ABNORMAL LOW (ref 150–400)
RBC: 4.37 MIL/uL (ref 4.22–5.81)
RDW: 12.3 % (ref 11.5–15.5)
WBC Count: 6.3 10*3/uL (ref 4.0–10.5)
nRBC: 0 % (ref 0.0–0.2)

## 2021-05-19 LAB — CMP (CANCER CENTER ONLY)
ALT: 200 U/L — ABNORMAL HIGH (ref 0–44)
AST: 159 U/L — ABNORMAL HIGH (ref 15–41)
Albumin: 3.1 g/dL — ABNORMAL LOW (ref 3.5–5.0)
Alkaline Phosphatase: 62 U/L (ref 38–126)
Anion gap: 10 (ref 5–15)
BUN: 15 mg/dL (ref 8–23)
CO2: 23 mmol/L (ref 22–32)
Calcium: 9.2 mg/dL (ref 8.9–10.3)
Chloride: 102 mmol/L (ref 98–111)
Creatinine: 0.72 mg/dL (ref 0.61–1.24)
GFR, Estimated: >60 mL/min (ref >60)
Glucose, Bld: 80 mg/dL (ref 70–99)
Potassium: 3.8 mmol/L (ref 3.5–5.1)
Sodium: 135 mmol/L (ref 135–145)
Total Bilirubin: 0.5 mg/dL (ref 0.3–1.2)
Total Protein: 7.9 g/dL (ref 6.5–8.1)

## 2021-05-19 MED ORDER — SODIUM CHLORIDE 0.9 % IV SOLN
15.0000 mg/kg | Freq: Once | INTRAVENOUS | Status: AC
  Administered 2021-05-19: 900 mg via INTRAVENOUS
  Filled 2021-05-19: qty 32

## 2021-05-19 MED ORDER — HYDROCODONE-ACETAMINOPHEN 5-325 MG PO TABS
1.0000 | ORAL_TABLET | Freq: Four times a day (QID) | ORAL | 0 refills | Status: DC | PRN
  Filled 2021-05-19: qty 20, 5d supply, fill #0

## 2021-05-19 MED ORDER — SODIUM CHLORIDE 0.9 % IV SOLN
1200.0000 mg | Freq: Once | INTRAVENOUS | Status: AC
  Administered 2021-05-19: 1200 mg via INTRAVENOUS
  Filled 2021-05-19: qty 20

## 2021-05-19 MED ORDER — SODIUM CHLORIDE 0.9 % IV SOLN
Freq: Once | INTRAVENOUS | Status: AC
  Filled 2021-05-19: qty 250

## 2021-05-19 NOTE — Patient Instructions (Signed)
[facility]  Discharge Instructions: Thank you for choosing [facility] to provide your oncology and hematology care.   If you have a lab appointment with the [facility], please go directly to the [facility] and check in at the registration area.   Wear comfortable clothing and clothing appropriate for easy access to any Portacath or PICC line.   We strive to give you quality time with your provider. You may need to reschedule your appointment if you arrive late (15 or more minutes).  Arriving late affects you and other patients whose appointments are after yours.  Also, if you miss three or more appointments without notifying the office, you may be dismissed from the clinic at the provider's discretion.      For prescription refill requests, have your pharmacy contact our office and allow 72 hours for refills to be completed.    Today you received the following chemotherapy and/or immunotherapy agents Tecentriq & Noah Charon      To help prevent nausea and vomiting after your treatment, we encourage you to take your nausea medication as directed.  BELOW ARE SYMPTOMS THAT SHOULD BE REPORTED IMMEDIATELY: *FEVER GREATER THAN 100.4 F (38 C) OR HIGHER *CHILLS OR SWEATING *NAUSEA AND VOMITING THAT IS NOT CONTROLLED WITH YOUR NAUSEA MEDICATION *UNUSUAL SHORTNESS OF BREATH *UNUSUAL BRUISING OR BLEEDING *URINARY PROBLEMS (pain or burning when urinating, or frequent urination) *BOWEL PROBLEMS (unusual diarrhea, constipation, pain near the anus) TENDERNESS IN MOUTH AND THROAT WITH OR WITHOUT PRESENCE OF ULCERS (sore throat, sores in mouth, or a toothache) UNUSUAL RASH, SWELLING OR PAIN  UNUSUAL VAGINAL DISCHARGE OR ITCHING   Items with * indicate a potential emergency and should be followed up as soon as possible or go to the Emergency Department if any problems should occur.  Please show the CHEMOTHERAPY ALERT CARD or IMMUNOTHERAPY ALERT CARD at  check-in to the Emergency Department and triage nurse.  Should you have questions after your visit or need to cancel or reschedule your appointment, please contact [facility]  Dept: [phone]  and follow the prompts.  Office hours are 8:00 a.m. to 4:30 p.m. Monday - Friday. Please note that voicemails left after 4:00 p.m. may not be returned until the following business day.  We are closed weekends and major holidays. You have access to a nurse at all times for urgent questions. Please call the main number to the clinic Dept: [phone] and follow the prompts.   For any non-urgent questions, you may also contact your provider using MyChart. We now offer e-Visits for anyone 60 and older to request care online for non-urgent symptoms. For details visit [email or web address].   Also download the MyChart app! Go to the app store, search "MyChart", open the app, select [facility], and log in with your MyChart username and password.  Due to Covid, a mask is required upon entering the hospital/clinic. If you do not have a mask, one will be given to you upon arrival. For doctor visits, patients may have 1 support person aged 3 or older with them. For treatment visits, patients cannot have anyone with them due to current Covid guidelines and our immunocompromised population.   Atezolizumab injection Qu es este medicamento? El ATEZOLIZUMAB es un anticuerpo monoclonal. Genene Churn en el tratamiento del cncer de vejiga (cncer urotelial), el cncer de hgado, el cncer de pulmn yel melanoma. Este medicamento puede ser utilizado para otros usos; si tiene [place] preguntaconsulte con su  proveedor de atencin mdica o con su farmacutico. MARCAS COMUNES: Tecentriq Qu le debo informar a mi profesional de la salud antes de tomar estemedicamento? Necesitan saber si usted presenta alguno de los siguientes problemas osituaciones: enfermedades autoinmunes tales como  enfermedad de Crohn, colitis ulcerativa o lupus ha tenido o planea tener un trasplante alognico de Social research officer, government ([place] las clulas madre de otra persona) antecedentes de trasplante de rganos antecedentes de radiacin en el pecho problemas del sistema nervioso, tales como miastenia grave o sndrome de Curator una reaccin alrgica o inusual al [employer], a otros medicamentos, alimentos, colorantes o conservantes si est embarazada o buscando quedar embarazada si estamamantando a un beb Cmo debo [employer]? Este medicamento se administra mediante infusin en una vena. Lo administra unprofesional de [employer]	[place] en un hospital o en un entorno clnico. Se le entregar una Gua del medicamento (MedGuide, su nombre en ingls) especial antes de cada tratamiento. Asegrese de leer esta informacin cada vezcuidadosamente. Hable con su pediatra para informarse acerca del uso de este medicamento ennios. Puede requerir atencin especial. Sobredosis: Pngase en contacto inmediatamente con un centro toxicolgico o unasala de urgencia si usted cree que haya tomado demasiado medicamento. ATENCIN: [employer] es solo para usted. No comparta este medicamento connadie. Qu sucede si me olvido de una dosis? Es importante no olvidar ninguna dosis. Informe a su mdico o a su profesionalde la salud si no puede asistir a una cita. Qu puede interactuar con este medicamento? No se han estudiado las interacciones. Puede ser que esta lista no menciona todas las posibles interacciones. Informe a su profesional de KB Home	[place] de [employer] productos a base de hierbas, medicamentos de [place] o suplementos nutritivos que est tomando. Si usted fuma, consume bebidas alcohlicas o si utiliza drogas ilegales, indqueselo tambin a su profesional de KB Home	[place]. Algunas sustancias pueden interactuar consu medicamento. A qu debo estar atento al usar Coca[employer]Cola? Se supervisar su estado de salud  atentamente mientras reciba este medicamento. Usted podra necesitar realizarse anlisis de sangre mientras est usando estemedicamento. No debe quedar embarazada mientras est [employer], o al menos por 5 meses despus de dejar de usarlo. Las mujeres deben informar a su mdico si estn buscando quedar embarazadas o si creen que podran estar embarazadas. Existe la posibilidad de efectos secundarios graves en un beb sin nacer. Para obtener ms informacin, hable con su profesional de la salud o su farmacutico. No debe Economist a un beb mientras est usando este medicamentoo, al menos, por 5 meses despus de la ltima dosis. Qu efectos secundarios puedo tener al [employer] este medicamento? Efectos secundarios que debe informar a su mdico o a su profesional de lasalud tan pronto como sea posible: Chief of Staff, tales como erupcin cutnea, comezn/picazn o urticaria, e hinchazn de la cara, los labios o la lengua heces de color negro y aspecto alquitranado diarrea con sangre o acuosa problemas para respirar cambios en la visin dolor u opresin en el pecho escalofros enrojecimiento del rostro fiebre dolor de cabeza signos y sntomas de niveles elevados de azcar en la sangre, tales como mareo; boca seca; piel seca; aliento frutal; nuseas; dolor de estmago; aumento del apetito o la sed; aumento de la frecuencia urinaria signos y sntomas de lesin en el hgado, tales como orina amarilla oscura o Forensic psychologist; sensacin general de estar enfermo o sntomas gripales; heces claras; prdida del apetito; nuseas; dolor en la regin abdominal superior derecha; debilidad o cansancio inusuales; color amarillento de los ojos o  la piel dolorestomacal dificultad para orinar o cambios en la cantidad de orina Efectos secundarios que generalmente no requieren atencin mdica (infrmelos asu mdico o a Barrister's clerk de la salud si persisten o si son molestos): dolor de huesos tos Geophysicist/field seismologist en las  Armed forces operational officer debilidad muscular hinchazn de los brazos o las piernas cansancio prdida depeso Puede ser que esta lista no menciona todos los posibles efectos secundarios. Comunquese a su mdico por asesoramiento mdico [employer]. Usted puede informar los efectos secundarios a la FDA por telfono al1-800-FDA-1088. Dnde debo guardar mi medicina? Este medicamento se administra en hospitales o clnicas, y no necesitarguardarlo en su domicilio. ATENCIN: Este folleto es un resumen. Puede ser que no cubra toda la posible informacin. Si usted tiene preguntas acerca de esta medicina, consulte con sumdico, su farmacutico o su profesional de Technical sales engineer.  2022 Elsevier/Gold Standard (2020-11-05 00:00:00)  Bevacizumab injection Qu es este medicamento? El BEVACIZUMAB es un anticuerpo monoclonal. [place] para tratar muchos tipos decncer. Este medicamento puede ser utilizado para otros usos; si tiene [place] preguntaconsulte con su proveedor de atencin mdica o con su farmacutico. MARCAS COMUNES: Avastin, MVASI, Jacob Moores le debo informar a mi profesional de la salud antes de tomar estemedicamento? Necesitan saber si usted presenta alguno de los siguientes problemas osituaciones: diabetes enfermedad cardiaca presin sangunea alta antecedentes de tos con sangre quimioterapia previa con antraciclinas (p. ej.: doxorubicina, daunorubicina, epirubicina) terapia de radiacin en curso o reciente ciruga reciente o planes para realizarse una ciruga accidente cerebrovascular una reaccin alrgica o inusual al bevacizumab, a las protenas de Production designer, theatre/television/film, a las protenas de ratn, a otros medicamentos, alimentos, Scientist, water quality o conservantessi est embarazada o buscando quedar embarazada si est amamantando a un beb Cmo debo Insurance account manager medicamento? Este medicamento se administra mediante infusin en una vena. Lo administra unprofesional de Technical sales engineer en un hospital o en un  entorno clnico. Hable con su pediatra para informarse acerca del uso de este medicamento ennios. Puede requerir atencin especial. Sobredosis: Pngase en contacto inmediatamente con un centro toxicolgico o unasala de urgencia si usted cree que haya tomado demasiado medicamento. ATENCIN: [employer] Foods es solo para usted. No comparta este medicamento connadie. Qu sucede si me olvido de una dosis? Es importante no olvidar ninguna dosis. Informe a su mdico o a su profesionalde la salud si no puede asistir a una cita. Qu puede interactuar con este medicamento? No se anticipan interacciones. Puede ser que esta lista no menciona todas las posibles interacciones. Informe a su profesional de KB Home	[place] de [employer] Corporation productos a base de hierbas, medicamentos de [place] o suplementos nutritivos que est tomando. Si usted fuma, consume bebidas alcohlicas o si utiliza drogas ilegales, indqueselo tambin a su profesional de KB Home	[place]. Algunas sustancias pueden interactuar consu medicamento. A qu debo estar atento al usar Coca-Cola? Se supervisar su estado de salud atentamente mientras reciba este medicamento. Tendr que hacerse anlisis de sangre importantes y Futures trader de orina mientrasest tomando este medicamento. Este medicamento podra aumentar el riesgo de moretones o sangrado. Consulte asu mdico o a su profesional de la salud si observa sangrados inusuales. Antes de realizarse una ciruga, hable con su proveedor de atencin mdica para asegurarse de que no hay ningn problema. Este frmaco puede aumentar el riesgo de que el sitio o la herida quirrgica no sanen correctamente. Deber dejar de usar este frmaco durante [address] antes de la [place]. Despus de la [place], espere al menos [address]  de Forensic psychologist uso de este frmaco. Asegrese de que el sitio o la herida quirrgica haya sanado lo suficiente antes de reiniciar el uso del frmaco. Hable con su proveedor de atencin mdica  sitiene alguna pregunta. No debe quedar embarazada mientras est usando este medicamento o por 6 meses despus de dejar de usarlo. Las mujeres deben informar a su mdico si estn buscando quedar embarazadas o si creen que podran estar embarazadas. Existe la posibilidad de efectos secundarios graves en un beb sin nacer. Para obtener ms informacin, hable con su profesional de la salud o su farmacutico. No debe Economist a un beb mientras est usando este medicamento y durante 63mses despus de la ltima dosis. Este medicamento ha causado insuficiencia ovrica en aReynolds American Este medicamento puede interferir con la capacidad de tener hijos. Usted debe hablarcon su mdico o su profesional de la salud si est preocupado por su fertilidad. Qu efectos secundarios puedo tener al uMasco Corporationeste medicamento? Efectos secundarios que debe informar a su mdico o a su profesional de lasalud tan pronto como sea posible: rChief of Staff tales como erupcin cutnea, comezn/picazn o urticaria, e hinchazn de la cara, los labios o la lHoliday representativeu opresin en el pecho escalofros tos con sangre fiebre alta convulsiones estreimiento grave signos y sntomas de sangrado, tales como heces con sangre o de color negro y aspecto alquitranado; orina de color rojo o marrn oscuro; escupir sangre o material marrn que tiene el aspecto de posos (residuos) de caf; mTree surgeonrojas en la piel; sangrado o moretones inusuales en los ojos, las encas o la nariz signos y sntomas de un cogulo sanguneo, tales como problemas respiratorios; dSocial research officer, governmenten el pecho; dolor de cabeza grave, repentino; dolor, hinchazn, calor en la pierna signos y sntomas de un accidente cerebrovascular, tales como cambios en la visin; confusin; dificultad para hablar o entender; dolores de cabeza intensos; entumecimiento o debilidad repentina de la cara, el brazo o la pierna; problemas al cWriter mChief of Staff prdida de equilibrio o coordinacin  dolorestomacal sudoracin hinchazn de piernas o tobillos vmito aumento de peso Efectos secundarios que generalmente no requieren atencin mdica (infrmelos asu mdico o a sBarrister's clerkde la salud si persisten o si son molestos): dolor de espalda cambios en el sentido del gusto disminucin del apetito pielseca nuseas cansancio Puede ser que esta lista no menciona todos los posibles efectos secundarios. Comunquese a su mdico por asesoramiento mdico sHumana Inc Usted puede informar los efectos secundarios a la FDA por telfono al1-800-FDA-1088. Dnde debo guardar mi medicina? Este medicamento se administra en hospitales o clnicas, y no necesitarguardarlo en su domicilio. ATENCIN: Este folleto es un resumen. Puede ser que no cubra toda la posible informacin. Si usted tiene preguntas acerca de esta medicina, consulte con sumdico, su farmacutico o su profesional de lTechnical sales engineer  2022 Elsevier/Gold Standard (2020-11-05 00:00:00)

## 2021-05-19 NOTE — Progress Notes (Signed)
Called pt to introduce myself as his Arboriculturist.  Pt has 2 insurance so copay assistance shouldn't be needed.  Pt gave the phone to a friend so I informed her of the [employer], went over what it covers and gave her the income requirement.  She said pt would like to apply so he will bring his proof of income to his next visit.  If approved I will give him an expense sheet and my card for any questions or concerns he may have in the future.

## 2021-05-19 NOTE — Progress Notes (Signed)
[place]   Telephone:(336) [phone] Fax:([phone]) [phone]   Clinic Follow up Note   Patient Care Team: Pcp, No as PCP - General [name], MD as Consulting Physician (Hematology and Oncology) [name], RN (Inactive) as Oncology Nurse Navigator  Date of Service:  05/19/2021  CHIEF COMPLAINT: f/u of liver cancer  SUMMARY OF ONCOLOGIC HISTORY: Oncology History Overview Note  Cancer Staging Hepatocellular carcinoma [facility]) Staging form: Liver, AJCC 8th Edition - Clinical stage from 03/08/2021: Stage IIIA (cT3, cN0, cM0) - Signed by [name], MD on 03/11/2021 Stage prefix: Initial diagnosis Histologic grade (G): G2 Histologic grading system: 4 grade system     Hepatocellular carcinoma ([place])   Initial Diagnosis   Liver mass, right lobe   03/05/2021 Imaging   CT CAP  IMPRESSION: 1. Right hepatic lobe masses which are suspicious for either metastatic disease or primary malignancy such as hepatocellular carcinoma. These are suboptimally evaluated on this noncontrast exam. Consider multidisciplinary oncology consultation with potential clinical strategies of tissue sampling versus PET to direct sampling. 2. No primary malignancy identified within the chest, abdomen, or pelvis, given limitation of noncontrast exam. 3. Left nephrolithiasis. 4. Coronary artery atherosclerosis. Aortic Atherosclerosis (ICD10-I70.0). 5. Esophageal air fluid level suggests dysmotility or gastroesophageal reflux.   03/06/2021 Imaging   MRI Liver  IMPRESSION: 9.5 cm heterogeneous hypovascular mass in anterior right hepatic lobe, and 1.3 cm hypervascular mass in the posterior right hepatic lobe. Differential diagnosis includes liver metastases and multifocal hepatocellular carcinoma. Suggest correlation with serum AFP level, and possible tissue sampling.   No evidence of abdominal metastatic disease.   03/08/2021 Initial Biopsy   A. LIVER, RIGHT, BIOPSY:  - Hepatocellular  carcinoma.   COMMENT:  The hepatocellular carcinoma is moderately differentiated and associated with areas of necrosis.  Immunohistochemistry shows positivity with glypican-3 and weak staining with HepPar 1.  The tumor is negative with arginase 1, alpha-fetoprotein, CDX2, cytokeratin 7, cytokeratin 20, cytokeratin AE1/AE3, PSA, prostein and MOC31.  Polyclonal CEA shows focal canalicular pattern.  The morphology and immunophenotype are consistent with hepatocellular carcinoma.    03/08/2021 Cancer Staging   Staging form: Liver, AJCC 8th Edition - Clinical stage from 03/08/2021: Stage IIIA (cT3, cN0, cM0) - Signed by [name], MD on 03/11/2021  Stage prefix: Initial diagnosis  Histologic grade (G): G2  Histologic grading system: 4 grade system    04/28/2021 -  Chemotherapy    Patient is on Treatment Plan: LUNG ATEZOLIZUMAB + BEVACIZUMAB Q21D MAINTENANCE          CURRENT THERAPY:  Atezolizumab and bevacizumab q 3 weeks  INTERVAL HISTORY:  Shawn Bailey is here for a follow up of liver cancer. He was last seen by me on 04/16/21 and by PA Murray Hodgkins in the interim. He presents to the clinic accompanied by his ex-wife and her husband. He reports his appetite is diminished, as well as change to his taste. He endorses drinking Ensure to supplement. He notes his bowel movements are normal. He reports pain to his joints and lower back that is chronic. He reports a lesion to his face that bleeds sometimes. He also reports he is not sleeping well, which could be from the heat.   All other systems were reviewed with the patient and are negative.  MEDICAL HISTORY:  Past Medical History:  Diagnosis Date   Depression    Hepatocellular carcinoma ([place]) 03/08/2021   Parasite infestation     SURGICAL HISTORY: Past Surgical History:  Procedure Laterality Date  NO PAST SURGERIES      I have reviewed the social history and family history with the patient and they are unchanged from previous  note.  ALLERGIES:  has No Known Allergies.  MEDICATIONS:  Current Outpatient Medications  Medication Sig Dispense Refill   Ascorbic Acid (VITAMIN C) 100 MG tablet Take 100 mg by mouth daily.     HYDROcodone-acetaminophen (NORCO) 5-325 MG tablet Take 1 tablet by mouth every 6 (six) hours as needed for moderate pain. 20 tablet 0   Multiple Vitamin (MULTIVITAMIN WITH MINERALS) TABS tablet Take 1 tablet by mouth daily.     ondansetron (ZOFRAN) 8 MG tablet Take 1 tablet (8 mg total) by mouth every 8 (eight) hours as needed for nausea or vomiting. 30 tablet 0   polyethylene glycol (MIRALAX / GLYCOLAX) 17 g packet Take 17 g by mouth daily as needed for mild constipation. 14 each 0   prochlorperazine (COMPAZINE) 10 MG tablet Take 1 tablet (10 mg total) by mouth every 6 (six) hours as needed for nausea or vomiting. 30 tablet 0   senna (SENOKOT) 8.6 MG TABS tablet Take 1 tablet (8.6 mg total) by mouth 2 (two) times daily. 120 tablet 0   vitamin B-12 (CYANOCOBALAMIN) 100 MCG tablet Take 100 mcg by mouth daily.     No current facility-administered medications for this visit.    PHYSICAL EXAMINATION: ECOG PERFORMANCE STATUS: 2 - Symptomatic, <50% confined to bed  Vitals:   05/19/21 1040  BP: (!) 155/96  Pulse: 87  Resp: 18  Temp: 98.3 F (36.8 C)  SpO2: 100%   Wt Readings from Last 3 Encounters:  05/19/21 126 lb 8 oz (57.4 kg)  04/28/21 130 lb 8 oz (59.2 kg)  04/16/21 130 lb 3.2 oz (59.1 kg)     GENERAL:alert, no distress and comfortable SKIN: skin color, texture, turgor are normal, no rashes or significant lesions EYES: normal, Conjunctiva are pink and non-injected, sclera clear  NECK: supple, thyroid normal size, non-tender, without nodularity LYMPH:  no palpable lymphadenopathy in the cervical, axillary  LUNGS: clear to auscultation and percussion with normal breathing effort HEART: regular rate & rhythm and no murmurs and no lower extremity edema ABDOMEN:abdomen soft, non-tender  and normal bowel sounds; (+) enlarged liver Musculoskeletal:no cyanosis of digits and no clubbing  NEURO: alert & oriented x 3 with fluent speech, no focal motor/sensory deficits  LABORATORY DATA:  I have reviewed the data as listed CBC Latest Ref Rng & Units 05/19/2021 04/28/2021 04/23/2021  WBC 4.0 - 10.5 K/uL 6.3 5.4 5.0  Hemoglobin 13.0 - 17.0 g/dL 13.6 12.8(L) 13.4  Hematocrit 39.0 - 52.0 % 39.4 36.9(L) 38.7(L)  Platelets 150 - 400 K/uL 148(L) 105(L) 118(L)     CMP Latest Ref Rng & Units 05/19/2021 04/28/2021 04/23/2021  Glucose 70 - 99 mg/dL 80 105(H) 82  BUN 8 - 23 mg/dL '15 14 13  '$ Creatinine 0.61 - 1.24 mg/dL 0.72 0.75 0.71  Sodium 135 - 145 mmol/L 135 136 133(L)  Potassium 3.5 - 5.1 mmol/L 3.8 3.9 4.0  Chloride 98 - 111 mmol/L 102 104 102  CO2 22 - 32 mmol/L '23 26 26  '$ Calcium 8.9 - 10.3 mg/dL 9.2 9.3 9.5  Total Protein 6.5 - 8.1 g/dL 7.9 7.9 8.1  Total Bilirubin 0.3 - 1.2 mg/dL 0.5 0.8 0.9  Alkaline Phos 38 - 126 U/L 62 71 74  AST 15 - 41 U/L 159(H) 126(H) 141(H)  ALT 0 - 44 U/L 200(H) 111(H) 122(H)  RADIOGRAPHIC STUDIES: I have personally reviewed the radiological images as listed and agreed with the findings in the report. No results found.   ASSESSMENT & PLAN:  Shawn Bailey is a 75 y.o. male with   1.  Multifocal hepatocellular carcinoma, cT3N0M, stage IIIA -Liver MRI 03/06/21 showed a large 9.5 cm and a 1.3 cm liver mass is seen in right lobe, biopsy 03/08/21 confirmed moderately differentiated hepatocellular carcinoma.  No nodal or distant metastasis on CT 03/05/21. -This is likely related to his HCV infection -He is not a candidate for liver resection or transplant. Dr. [name] at [facility] did not recommend surgery given the location and extent of disease. -He started atezolizumab and bevacizumab on 04/28/21, while waiting for liver radioembolization. -labs reviewed, overall adequate to proceed with C2 today. -He does not have an appointment with IR yet. I will  reach out to them regarding the referral I previously placed for him on 04/16/21. -will hold beva before and after Y90    2. Untreated HCV  -During 02/2021 Hospitalization, labs showed HIV non reactive and hepatitis panel hep C ab positive, he was not aware that he has HCV infection -due to his recent [facility] diagnosis, will hold off HCV treatment for now -he does not see any doctors regularly. I previously referred him to the liver clinic for evaluation of his cancer, HCV, and for endoscopy when needed. This has not yet been scheduled.   3. Weight loss, decreased appetite, and fatigue -Secondary to liver cancer -he has diminished appetite and taste changes. Because of these, he has lost 4 lbs over the last 3 weeks. -He will follow with our nutritionist, whom he most recently saw 04/07/21. He is not currently scheduled for follow up.   PLAN: -proceed with C2 atezolizumab/bevacizumab today -I will reach out to IR regarding his referral -labs, f/u, and C3 in 3 weeks    No problem-specific Assessment & Plan notes found for this encounter.   Orders Placed This Encounter  Procedures   Ambulatory referral to Interventional Radiology    Referral Priority:   Routine    Referral Type:   Consultation    Referral Reason:   Specialty Services Required    Requested Specialty:   Interventional Radiology    Number of Visits Requested:   1   Ambulatory referral to Dermatology    Referral Priority:   Routine    Referral Type:   Consultation    Referral Reason:   Specialty Services Required    Requested Specialty:   Dermatology    Number of Visits Requested:   1    All questions were answered. The patient knows to call the clinic with any problems, questions or concerns. No barriers to learning was detected. The total time spent in the appointment was 30 minutes.     [name], MD 05/19/2021   I, Wilburn Mylar, am acting as scribe for [name], MD.   I have reviewed the above documentation  for accuracy and completeness, and I agree with the above.

## 2021-05-19 NOTE — Progress Notes (Signed)
Nutrition  Unable to complete nutrition follow-up during infusion today. Patient completed treatment early today. Spoke with discharge nurse who reports reminding patient of nutrition appointment. Per RN patient reported he had already seen nutrition today. Will reschedule nutrition appointment with treatment plan as able.

## 2021-05-20 ENCOUNTER — Other Ambulatory Visit: Payer: Self-pay | Admitting: Hematology

## 2021-05-20 ENCOUNTER — Telehealth: Payer: Self-pay | Admitting: Hematology

## 2021-05-20 DIAGNOSIS — C220 Liver cell carcinoma: Secondary | ICD-10-CM

## 2021-05-20 NOTE — Telephone Encounter (Signed)
Scheduled follow-up appointment per 8/3 los. Patient's friend is aware.

## 2021-06-01 ENCOUNTER — Ambulatory Visit
Admission: RE | Admit: 2021-06-01 | Discharge: 2021-06-01 | Disposition: A | Discharge status: Home / Self Care | Payer: Medicare Other | Source: Ambulatory Visit | Attending: Hematology | Admitting: Hematology

## 2021-06-01 DIAGNOSIS — C220 Liver cell carcinoma: Secondary | ICD-10-CM

## 2021-06-01 HISTORY — PX: IR RADIOLOGIST EVAL & MGMT: IMG5224

## 2021-06-02 ENCOUNTER — Other Ambulatory Visit: Payer: Self-pay | Admitting: Interventional Radiology

## 2021-06-02 NOTE — Consult Note (Signed)
Chief Complaint: Patient was seen in consultation today for hepatocellular carcinoma at the request of Feng,Yan  Referring Physician(s): Feng,Yan  History of Present Illness: Shawn Bailey is a 75 y.o. 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  His dominant lesion measures 9.5 cm in the anterior right hepatic lobe with a smaller 1.3 cm satellite lesion more posteriorly in the right hepatic lobe.  AFP is significantly elevated at 2,353.  The mass was biopsied and found to be consistent with moderately differentiated hepatocellular carcinoma.  Clinical stage IIIA (cT3, cN0, cM0).  He is currently under the care of Dr. Morey Hummingbird and undergoing combination immunotherapy with Tecentriq plus avastin Q 3 weeks.  He is tolerating therapy quite well.  He presents today accompanied by his ex-wife to discuss concurrent liver directed therapy.   He is in good spirits.  His appetite is diminished and food no longer tastes the same to him.  Prior to his diagnosis he had lost approximately 30 pounds over a 6 week.  He denies abdominal pain, nausea, vomiting or other systemic symptoms.    Past Medical History:  Diagnosis Date   Depression    Hepatocellular carcinoma ([place]) 03/08/2021   Parasite infestation     Past Surgical History:  Procedure Laterality Date   IR RADIOLOGIST EVAL & MGMT  06/01/2021   NO PAST SURGERIES      Allergies: Patient has no known allergies.  Medications: Prior to Admission medications   Medication Sig Start Date End Date Taking? Authorizing Provider  Ascorbic Acid (VITAMIN C) 100 MG tablet Take 100 mg by mouth daily.    [provider]  HYDROcodone-acetaminophen (NORCO) 5-325 MG tablet Take 1 tablet by mouth every 6 (six) hours as needed for moderate pain. 05/19/21   [name], MD  Multiple Vitamin (MULTIVITAMIN WITH MINERALS) TABS tablet Take 1 tablet by mouth daily.    [provider]  ondansetron (ZOFRAN) 8 MG  tablet Take 1 tablet (8 mg total) by mouth every 8 (eight) hours as needed for nausea or vomiting. 04/28/21   Dede Query T, PA-C  polyethylene glycol (MIRALAX / GLYCOLAX) 17 g packet Take 17 g by mouth daily as needed for mild constipation. 03/08/21   Georgette Shell, MD  prochlorperazine (COMPAZINE) 10 MG tablet Take 1 tablet (10 mg total) by mouth every 6 (six) hours as needed for nausea or vomiting. 04/28/21   Lincoln Brigham, PA-C  senna (SENOKOT) 8.6 MG TABS tablet Take 1 tablet (8.6 mg total) by mouth 2 (two) times daily. 03/08/21   Georgette Shell, MD  vitamin B-12 (CYANOCOBALAMIN) 100 MCG tablet Take 100 mcg by mouth daily.    [provider]     Family History  Problem Relation Age of Onset   Diabetes Mother     Social History   Socioeconomic History   Marital status: Divorced    Spouse name: Not on file   Number of children: 2   Years of education: Not on file   Highest education level: Not on file  Occupational History   Not on file  Tobacco Use   Smoking status: Never   Smokeless tobacco: Never  Vaping Use   Vaping Use: Never used  Substance and Sexual Activity   Alcohol use: Yes    Comment: occasional beer, he used to drink alcohol regularly 15 years    Drug use: Not Currently   Sexual activity: Not on file  Other Topics Concern   Not  on file  Social History Narrative   Not on file   Social Determinants of Health   Financial Resource Strain: Not on file  Food Insecurity: Not on file  Transportation Needs: Not on file  Physical Activity: Not on file  Stress: Not on file  Social Connections: Not on file    ECOG Status: 0 - Asymptomatic  Review of Systems: A 12 point ROS discussed and pertinent positives are indicated in the HPI above.  All other systems are negative.  Review of Systems  Vital Signs: There were no vitals taken for this visit.  Physical Exam Constitutional:      Appearance: Normal appearance. He is not  toxic-appearing.  HENT:     Head: Normocephalic and atraumatic.  Eyes:     General: No scleral icterus. Cardiovascular:     Rate and Rhythm: Normal rate.  Pulmonary:     Effort: Pulmonary effort is normal.  Abdominal:     General: Abdomen is flat. There is no distension.     Palpations: Abdomen is soft.     Tenderness: There is no abdominal tenderness.  Skin:    General: Skin is warm and dry.  Neurological:     Mental Status: He is alert and oriented to person, place, and time.  Psychiatric:        Mood and Affect: Mood normal.        Behavior: Behavior normal.      Imaging: IR Radiologist Eval & Mgmt  Result Date: 06/01/2021 Please refer to notes tab for details about interventional procedure. (Op Note)   Labs:  CBC: Recent Labs    03/08/21 0446 04/23/21 1054 04/28/21 1158 05/19/21 0944  WBC 7.3 5.0 5.4 6.3  HGB 15.1 13.4 12.8* 13.6  HCT 45.4 38.7* 36.9* 39.4  PLT 133* 118* 105* 148*    COAGS: Recent Labs    03/05/21 1255 03/08/21 0446  INR 1.1 0.9  APTT 29  --     BMP: Recent Labs    03/08/21 0446 04/23/21 1054 04/28/21 1158 05/19/21 0944  NA 137 133* 136 135  K 4.2 4.0 3.9 3.8  CL 99 102 104 102  CO2 32 '26 26 23  '$ GLUCOSE 107* 82 105* 80  BUN '23 13 14 15  '$ CALCIUM 9.9 9.5 9.3 9.2  CREATININE 1.00 0.71 0.75 0.72  GFRNONAA >60 >60 >60 >60    LIVER FUNCTION TESTS: Recent Labs    03/08/21 0446 04/23/21 1054 04/28/21 1158 05/19/21 0944  BILITOT 0.5 0.9 0.8 0.5  AST 127* 141* 126* 159*  ALT 108* 122* 111* 200*  ALKPHOS 59 74 71 62  PROT 8.7* 8.1 7.9 7.9  ALBUMIN 3.7 3.2* 3.2* 3.1*    TUMOR MARKERS: No results for input(s): AFPTM, CEA, CA199, CHROMGRNA in the last 8760 hours.  Assessment and Plan:  Pleasant 75 year old gentleman with biopsy proven stage IIIA hepatocellular carcinoma in the setting of HCV cirrhosis.  He is currently undergoing systemic therapy with Tecentriq plus Avastin.  His last imaging was in May of 2022,  approximately 3 months ago.  He is a candidate for concurrent liver directed therapy with trans arterial radioembolization (TARE/Y90).  His performance status is quite good and he is motivated to pursue liver directed therapy.    1.)  Triple phase CTA abdomen with contrast to assess arterial anatomy as well as re-assess disease burden for Y90 planning now.  2.) Hold bevacizumab now prior to Y90. May resume after completed.  3.) Schedule for Y90 planning  study and RIGHT lobar TARE.    Thank you for this interesting consult.  I greatly enjoyed meeting Kiaeem Goold and look forward to participating in their care.  A copy of this report was sent to the requesting provider on this date.  Electronically Signed: Criselda Peaches 06/02/2021, 7:37 AM   I spent a total of  60 Minutes  in face to face in clinical consultation, greater than 50% of which was counseling/coordinating care for hepatocellular cancer.

## 2021-06-07 ENCOUNTER — Other Ambulatory Visit: Payer: Self-pay | Admitting: Interventional Radiology

## 2021-06-07 DIAGNOSIS — C220 Liver cell carcinoma: Secondary | ICD-10-CM

## 2021-06-09 ENCOUNTER — Other Ambulatory Visit (HOSPITAL_COMMUNITY): Payer: Self-pay | Admitting: Interventional Radiology

## 2021-06-09 DIAGNOSIS — C220 Liver cell carcinoma: Secondary | ICD-10-CM

## 2021-06-10 ENCOUNTER — Other Ambulatory Visit: Payer: Self-pay

## 2021-06-10 ENCOUNTER — Inpatient Hospital Stay: Payer: Medicare Other

## 2021-06-10 ENCOUNTER — Inpatient Hospital Stay (HOSPITAL_BASED_OUTPATIENT_CLINIC_OR_DEPARTMENT_OTHER): Payer: Medicare Other | Admitting: Hematology

## 2021-06-10 ENCOUNTER — Encounter: Payer: Self-pay | Admitting: Hematology

## 2021-06-10 VITALS — BP 149/83 | HR 67 | Temp 98.3°F | Resp 18 | Ht 65.0 in | Wt 128.7 lb

## 2021-06-10 DIAGNOSIS — C220 Liver cell carcinoma: Secondary | ICD-10-CM | POA: Diagnosis not present

## 2021-06-10 DIAGNOSIS — Z5112 Encounter for antineoplastic immunotherapy: Secondary | ICD-10-CM | POA: Diagnosis not present

## 2021-06-10 LAB — CBC WITH DIFFERENTIAL (CANCER CENTER ONLY)
Abs Immature Granulocytes: 0.01 10*3/uL (ref 0.00–0.07)
Basophils Absolute: 0 10*3/uL (ref 0.0–0.1)
Basophils Relative: 1 %
Eosinophils Absolute: 0.1 10*3/uL (ref 0.0–0.5)
Eosinophils Relative: 3 %
HCT: 38.2 % — ABNORMAL LOW (ref 39.0–52.0)
Hemoglobin: 13.4 g/dL (ref 13.0–17.0)
Immature Granulocytes: 0 %
Lymphocytes Relative: 48 %
Lymphs Abs: 2.1 10*3/uL (ref 0.7–4.0)
MCH: 31.8 pg (ref 26.0–34.0)
MCHC: 35.1 g/dL (ref 30.0–36.0)
MCV: 90.7 fL (ref 80.0–100.0)
Monocytes Absolute: 0.6 10*3/uL (ref 0.1–1.0)
Monocytes Relative: 13 %
Neutro Abs: 1.5 10*3/uL — ABNORMAL LOW (ref 1.7–7.7)
Neutrophils Relative %: 35 %
Platelet Count: 108 10*3/uL — ABNORMAL LOW (ref 150–400)
RBC: 4.21 MIL/uL — ABNORMAL LOW (ref 4.22–5.81)
RDW: 12.9 % (ref 11.5–15.5)
WBC Count: 4.3 10*3/uL (ref 4.0–10.5)
nRBC: 0 % (ref 0.0–0.2)

## 2021-06-10 LAB — CMP (CANCER CENTER ONLY)
ALT: 18 U/L (ref 0–44)
AST: 24 U/L (ref 15–41)
Albumin: 3.4 g/dL — ABNORMAL LOW (ref 3.5–5.0)
Alkaline Phosphatase: 64 U/L (ref 38–126)
Anion gap: 7 (ref 5–15)
BUN: 15 mg/dL (ref 8–23)
CO2: 23 mmol/L (ref 22–32)
Calcium: 9.5 mg/dL (ref 8.9–10.3)
Chloride: 106 mmol/L (ref 98–111)
Creatinine: 0.76 mg/dL (ref 0.61–1.24)
GFR, Estimated: >60 mL/min (ref >60)
Glucose, Bld: 91 mg/dL (ref 70–99)
Potassium: 4 mmol/L (ref 3.5–5.1)
Sodium: 136 mmol/L (ref 135–145)
Total Bilirubin: 0.5 mg/dL (ref 0.3–1.2)
Total Protein: 8 g/dL (ref 6.5–8.1)

## 2021-06-10 MED ORDER — SODIUM CHLORIDE 0.9 % IV SOLN: Freq: Once | INTRAVENOUS | Status: AC

## 2021-06-10 MED ORDER — SODIUM CHLORIDE 0.9 % IV SOLN
1200.0000 mg | Freq: Once | INTRAVENOUS | Status: AC
  Administered 2021-06-10: 1200 mg via INTRAVENOUS
  Filled 2021-06-10: qty 20

## 2021-06-10 NOTE — Patient Instructions (Signed)
[facility] MEDICAL ONCOLOGY  Discharge Instructions: Thank you for choosing [facility] to provide your oncology and hematology care.   If you have a lab appointment with the Cancer Center, please go directly to the Cancer Center and check in at the registration area.   Wear comfortable clothing and clothing appropriate for easy access to any Portacath or PICC line.   We strive to give you quality time with your provider. You may need to reschedule your appointment if you arrive late (15 or more minutes).  Arriving late affects you and other patients whose appointments are after yours.  Also, if you miss three or more appointments without notifying the office, you may be dismissed from the clinic at the provider's discretion.      For prescription refill requests, have your pharmacy contact our office and allow 72 hours for refills to be completed.    Today you received the following chemotherapy and/or immunotherapy agents: tecentriq      To help prevent nausea and vomiting after your treatment, we encourage you to take your nausea medication as directed.  BELOW ARE SYMPTOMS THAT SHOULD BE REPORTED IMMEDIATELY: *FEVER GREATER THAN 100.4 F (38 C) OR HIGHER *CHILLS OR SWEATING *NAUSEA AND VOMITING THAT IS NOT CONTROLLED WITH YOUR NAUSEA MEDICATION *UNUSUAL SHORTNESS OF BREATH *UNUSUAL BRUISING OR BLEEDING *URINARY PROBLEMS (pain or burning when urinating, or frequent urination) *BOWEL PROBLEMS (unusual diarrhea, constipation, pain near the anus) TENDERNESS IN MOUTH AND THROAT WITH OR WITHOUT PRESENCE OF ULCERS (sore throat, sores in mouth, or a toothache) UNUSUAL RASH, SWELLING OR PAIN  UNUSUAL VAGINAL DISCHARGE OR ITCHING   Items with * indicate a potential emergency and should be followed up as soon as possible or go to the Emergency Department if any problems should occur.  Please show the CHEMOTHERAPY ALERT CARD or IMMUNOTHERAPY ALERT CARD at check-in to  the Emergency Department and triage nurse.  Should you have questions after your visit or need to cancel or reschedule your appointment, please contact [facility] MEDICAL ONCOLOG[facility]  Dept: [phone]  and follow the prompts.  Office hours are 8:00 a.m. to 4:30 p.m. Monday - Friday. Please note that voicemails left after 4:00 p.m. may not be returned until the following business day.  We are closed weekends and major holidays. You have access to a nurse at all times for urgent questions. Please call the main number to the clinic Dept: [phone] and follow the prompts.   For any non-urgent questions, you may also contact your provider using MyChart. We now offer e-Visits for anyone 18 and older to request care online for non-urgent symptoms. For details visit [email or web address].   Also download the MyChart app! Go to the app store, search "MyChart", open the app, select [facility], and log in with your MyChart username and password.  Due to Covid, a mask is required upon entering the hospital/clinic. If you do not have a mask, one will be given to you upon arrival. For doctor visits, patients may have 1 support person aged 18 or older with them. For treatment visits, patients cannot have anyone with them due to current Covid guidelines and our immunocompromised population.   

## 2021-06-10 NOTE — Progress Notes (Signed)
Pt is approved for the $1000 [employer] grant.  

## 2021-06-10 NOTE — Progress Notes (Signed)
[place]   Telephone:(336) [phone] Fax:(336) [phone]   Clinic Follow up Note   Patient Care Team: Pcp, No as PCP - General [name], MD as Consulting Physician (Hematology and Oncology) [name], RN (Inactive) as Oncology Nurse Navigator  Date of Service:  06/10/2021  CHIEF COMPLAINT: f/u of liver cancer  ASSESSMENT & PLAN:  Shawn Bailey is a 75 y.o. male with   1.  Multifocal hepatocellular carcinoma, cT3N0M, stage IIIA -Liver MRI 03/06/21 showed a large 9.5 cm and a 1.3 cm liver mass is seen in right lobe, biopsy 03/08/21 confirmed moderately differentiated hepatocellular carcinoma.  No nodal or distant metastasis on CT 03/05/21. -This is likely related to his HCV infection -He is not a candidate for liver resection or transplant. Dr. [name] at [facility] did not recommend surgery given the location and extent of disease. -He started atezolizumab and bevacizumab on 04/28/21, while waiting for liver radioembolization. He tolerates this well with mild fatigue. -he is scheduled for Y90 with procedures on 07/01/21 and 07/22/21. He will proceed with atezolizumab alone today, will hold beva during his Y90   2. Untreated HCV  -During 02/2021 Hospitalization, labs showed HIV non reactive and hepatitis panel hep C ab positive, he was not aware that he has HCV infection -due to his recent [facility] diagnosis, will hold off HCV treatment for now -he does not see any doctors regularly. I previously referred him to the liver clinic for evaluation of his cancer, HCV, and for endoscopy when needed. This has not yet been scheduled.   3. Weight loss, decreased appetite, and fatigue -Secondary to liver cancer -he has diminished appetite and taste changes, causing weight loss. He has gained some weight back over the last three weeks. -He will continue follow with our nutritionist as needed     PLAN: -proceed with atezolizumab alone today -labs, f/u, and atezolizumab on  06/30/21 -she is scheduled with IR for Y90 in next few months    No problem-specific Assessment & Plan notes found for this encounter.   SUMMARY OF ONCOLOGIC HISTORY: Oncology History Overview Note  Cancer Staging Hepatocellular carcinoma [facility]) Staging form: Liver, AJCC 8th Edition - Clinical stage from 03/08/2021: Stage IIIA (cT3, cN0, cM0) - Signed by [name], MD on 03/11/2021 Stage prefix: Initial diagnosis Histologic grade (G): G2 Histologic grading system: 4 grade system     Hepatocellular carcinoma ([place])   Initial Diagnosis   Liver mass, right lobe   03/05/2021 Imaging   CT CAP  IMPRESSION: 1. Right hepatic lobe masses which are suspicious for either metastatic disease or primary malignancy such as hepatocellular carcinoma. These are suboptimally evaluated on this noncontrast exam. Consider multidisciplinary oncology consultation with potential clinical strategies of tissue sampling versus PET to direct sampling. 2. No primary malignancy identified within the chest, abdomen, or pelvis, given limitation of noncontrast exam. 3. Left nephrolithiasis. 4. Coronary artery atherosclerosis. Aortic Atherosclerosis (ICD10-I70.0). 5. Esophageal air fluid level suggests dysmotility or gastroesophageal reflux.   03/06/2021 Imaging   MRI Liver  IMPRESSION: 9.5 cm heterogeneous hypovascular mass in anterior right hepatic lobe, and 1.3 cm hypervascular mass in the posterior right hepatic lobe. Differential diagnosis includes liver metastases and multifocal hepatocellular carcinoma. Suggest correlation with serum AFP level, and possible tissue sampling.   No evidence of abdominal metastatic disease.   03/08/2021 Initial Biopsy   A. LIVER, RIGHT, BIOPSY:  - Hepatocellular carcinoma.   COMMENT:  The hepatocellular carcinoma is moderately differentiated and associated with areas of necrosis.  Immunohistochemistry shows positivity with glypican-3 and weak staining with HepPar 1.   The tumor is negative with arginase 1, alpha-fetoprotein, CDX2, cytokeratin 7, cytokeratin 20, cytokeratin AE1/AE3, PSA, prostein and MOC31.  Polyclonal CEA shows focal canalicular pattern.  The morphology and immunophenotype are consistent with hepatocellular carcinoma.    03/08/2021 Cancer Staging   Staging form: Liver, AJCC 8th Edition - Clinical stage from 03/08/2021: Stage IIIA (cT3, cN0, cM0) - Signed by [name], MD on 03/11/2021 Stage prefix: Initial diagnosis Histologic grade (G): G2 Histologic grading system: 4 grade system   04/28/2021 -  Chemotherapy    Patient is on Treatment Plan: LUNG ATEZOLIZUMAB + BEVACIZUMAB Q21D MAINTENANCE          CURRENT THERAPY:  -Atezolizumab and bevacizumab q 3 weeks -Y90 scheduled for 07/01/21 and 07/22/21, will hold beva before and after  INTERVAL HISTORY:  Shawn Bailey is here for a follow up of liver cancer. He was last seen by me on 05/19/21. He presents to the clinic accompanied by his ex-wife and her husband. He reports doing well overall. He notes some mild fatigue following the treatment, but this resolves quickly. He denies LE swelling.   All other systems were reviewed with the patient and are negative.  MEDICAL HISTORY:  Past Medical History:  Diagnosis Date   Depression    Hepatocellular carcinoma ([place]) 03/08/2021   Parasite infestation     SURGICAL HISTORY: Past Surgical History:  Procedure Laterality Date   IR RADIOLOGIST EVAL & MGMT  06/01/2021   NO PAST SURGERIES      I have reviewed the social history and family history with the patient and they are unchanged from previous note.  ALLERGIES:  has No Known Allergies.  MEDICATIONS:  Current Outpatient Medications  Medication Sig Dispense Refill   Ascorbic Acid (VITAMIN C) 100 MG tablet Take 100 mg by mouth daily.     HYDROcodone-acetaminophen (NORCO) 5-325 MG tablet Take 1 tablet by mouth every 6 (six) hours as needed for moderate pain. 20 tablet 0   Multiple  Vitamin (MULTIVITAMIN WITH MINERALS) TABS tablet Take 1 tablet by mouth daily.     ondansetron (ZOFRAN) 8 MG tablet Take 1 tablet (8 mg total) by mouth every 8 (eight) hours as needed for nausea or vomiting. 30 tablet 0   polyethylene glycol (MIRALAX / GLYCOLAX) 17 g packet Take 17 g by mouth daily as needed for mild constipation. 14 each 0   prochlorperazine (COMPAZINE) 10 MG tablet Take 1 tablet (10 mg total) by mouth every 6 (six) hours as needed for nausea or vomiting. 30 tablet 0   senna (SENOKOT) 8.6 MG TABS tablet Take 1 tablet (8.6 mg total) by mouth 2 (two) times daily. 120 tablet 0   vitamin B-12 (CYANOCOBALAMIN) 100 MCG tablet Take 100 mcg by mouth daily.     No current facility-administered medications for this visit.    PHYSICAL EXAMINATION: ECOG PERFORMANCE STATUS: 1 - Symptomatic but completely ambulatory  Vitals:   06/10/21 0932  BP: (!) 149/83  Pulse: 67  Resp: 18  Temp: 98.3 F (36.8 C)  SpO2: 100%   Wt Readings from Last 3 Encounters:  06/10/21 128 lb 11.2 oz (58.4 kg)  05/19/21 126 lb 8 oz (57.4 kg)  04/28/21 130 lb 8 oz (59.2 kg)     GENERAL:alert, no distress and comfortable SKIN: skin color normal, no rashes or significant lesions EYES: normal, Conjunctiva are pink and non-injected, sclera clear  NEURO: alert & oriented x 3 with  fluent speech  LABORATORY DATA:  I have reviewed the data as listed CBC Latest Ref Rng & Units 06/10/2021 05/19/2021 04/28/2021  WBC 4.0 - 10.5 K/uL 4.3 6.3 5.4  Hemoglobin 13.0 - 17.0 g/dL 13.4 13.6 12.8(L)  Hematocrit 39.0 - 52.0 % 38.2(L) 39.4 36.9(L)  Platelets 150 - 400 K/uL 108(L) 148(L) 105(L)     CMP Latest Ref Rng & Units 06/10/2021 05/19/2021 04/28/2021  Glucose 70 - 99 mg/dL 91 80 105(H)  BUN 8 - 23 mg/dL '15 15 14  '$ Creatinine 0.61 - 1.24 mg/dL 0.76 0.72 0.75  Sodium 135 - 145 mmol/L 136 135 136  Potassium 3.5 - 5.1 mmol/L 4.0 3.8 3.9  Chloride 98 - 111 mmol/L 106 102 104  CO2 22 - 32 mmol/L '23 23 26  '$ Calcium 8.9 -  10.3 mg/dL 9.5 9.2 9.3  Total Protein 6.5 - 8.1 g/dL 8.0 7.9 7.9  Total Bilirubin 0.3 - 1.2 mg/dL 0.5 0.5 0.8  Alkaline Phos 38 - 126 U/L 64 62 71  AST 15 - 41 U/L 24 159(H) 126(H)  ALT 0 - 44 U/L 18 200(H) 111(H)      RADIOGRAPHIC STUDIES: I have personally reviewed the radiological images as listed and agreed with the findings in the report. No results found.    No orders of the defined types were placed in this encounter.  All questions were answered. The patient knows to call the clinic with any problems, questions or concerns. No barriers to learning was detected. The total time spent in the appointment was 30 minutes.     [name], MD 06/10/2021   I, Wilburn Mylar, am acting as scribe for [name], MD.   I have reviewed the above documentation for accuracy and completeness, and I agree with the above.

## 2021-06-11 LAB — AFP TUMOR MARKER: AFP, Serum, Tumor Marker: 135 ng/mL — ABNORMAL HIGH (ref 0.0–8.4)

## 2021-06-16 ENCOUNTER — Ambulatory Visit (HOSPITAL_COMMUNITY)
Admission: RE | Admit: 2021-06-16 | Discharge: 2021-06-16 | Disposition: A | Discharge status: Home / Self Care | Payer: Medicare Other | Source: Ambulatory Visit | Attending: Interventional Radiology | Admitting: Interventional Radiology

## 2021-06-16 ENCOUNTER — Other Ambulatory Visit: Payer: Self-pay

## 2021-06-16 ENCOUNTER — Other Ambulatory Visit: Payer: Self-pay | Admitting: Interventional Radiology

## 2021-06-16 ENCOUNTER — Encounter (HOSPITAL_COMMUNITY): Payer: Self-pay

## 2021-06-16 DIAGNOSIS — C220 Liver cell carcinoma: Secondary | ICD-10-CM

## 2021-06-16 IMAGING — CT CT ABDOMEN WO/W CM
4 of 15 series · 11 of 46 positions shown, 16 images · IV contrast (APPLIED)
Comparison: Comparison made with MRI and CT evaluations from [DATE] in [DATE] respectively.

CLINICAL DATA: 75-year-old male with history of hepatocellular
carcinoma by report.

EXAM:
CT ABDOMEN WITHOUT AND WITH CONTRAST
TECHNIQUE: Multidetector CT imaging of the abdomen was performed following the
standard protocol before and following the bolus administration of
intravenous contrast.
CONTRAST:  80mL OMNIPAQUE IOHEXOL 350 MG/ML SOLN

[Series 2: axial arterial · axial · arterial · 0.71mm/px · z∈[-229,-106]mm · 4 of 69 slices shown (1 of 2)]
[im 14/69  soft-tissue]
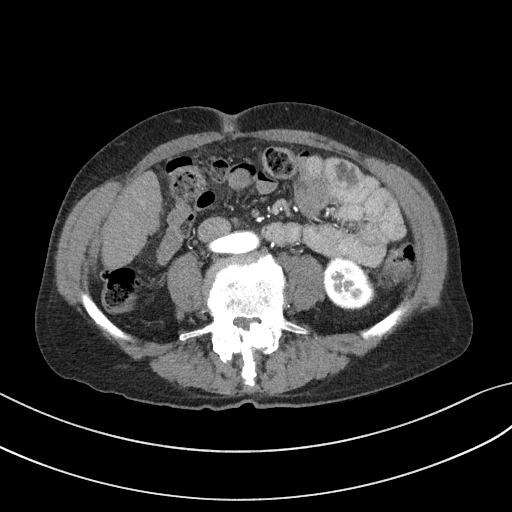
[im 28/69  soft-tissue]
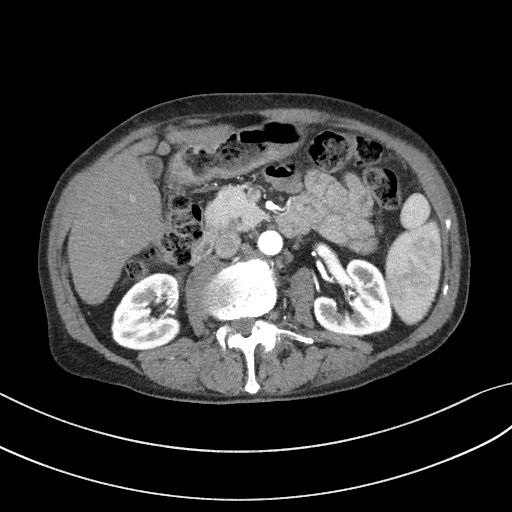
[im 41/69  soft-tissue]
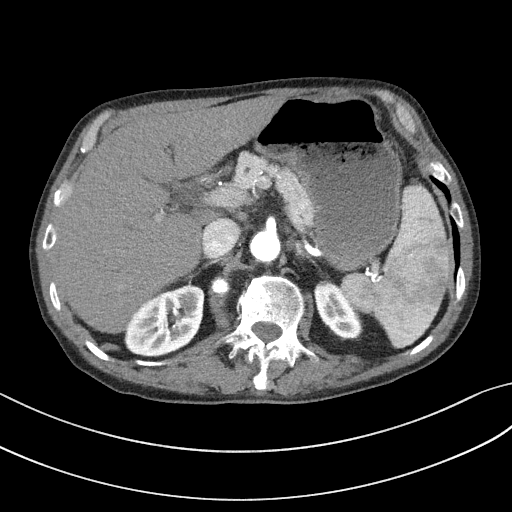
[im 55/69  soft-tissue]
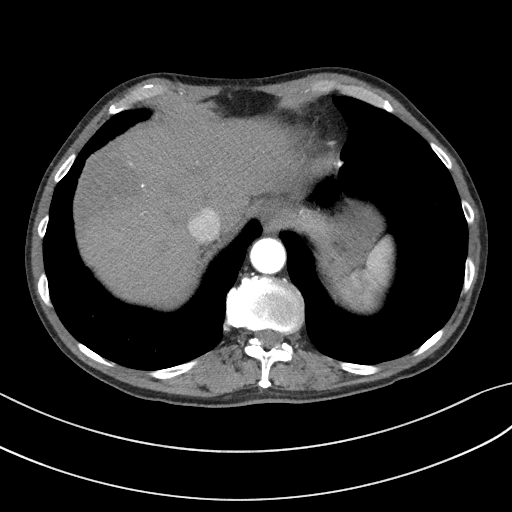

[Series 5: coronal arterial · coronal · arterial · 0.49mm/px · 1 of 83 slices shown, 2 images]
[im 42/83  soft-tissue]
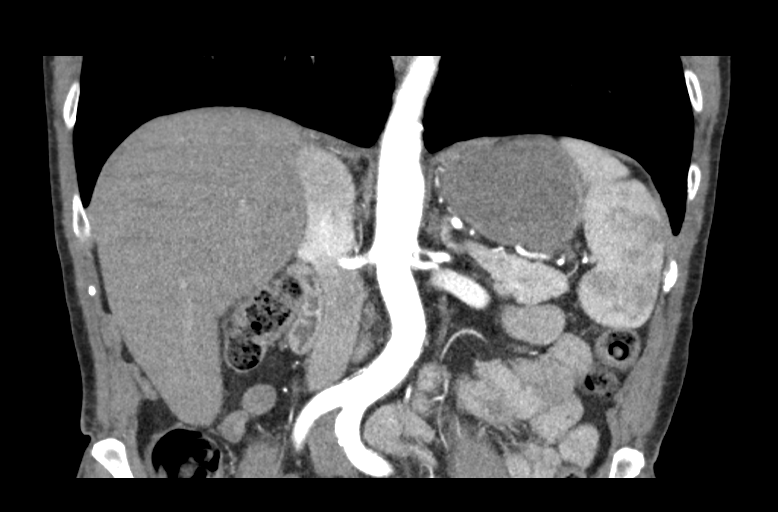
[im 42/83  bone]
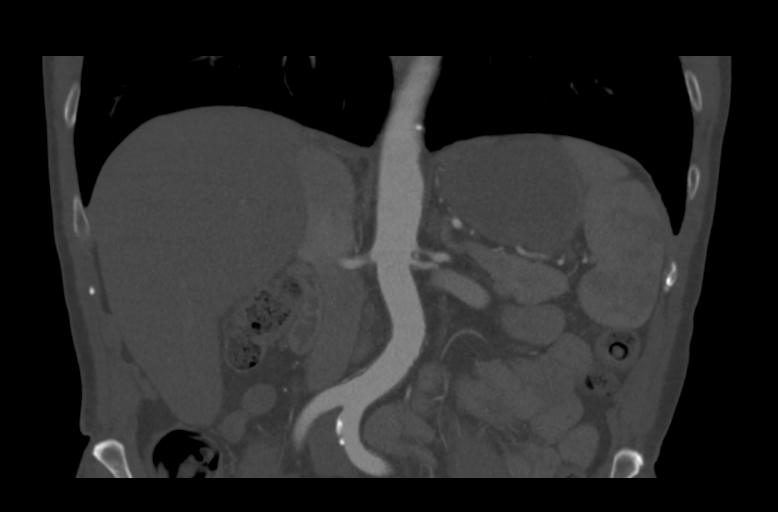

[Series 7: axial venous · axial · portal-venous · 0.63mm/px · z∈[-219,-117]mm · 3 of 70 slices shown, 7 images]
[im 18/70  soft-tissue]
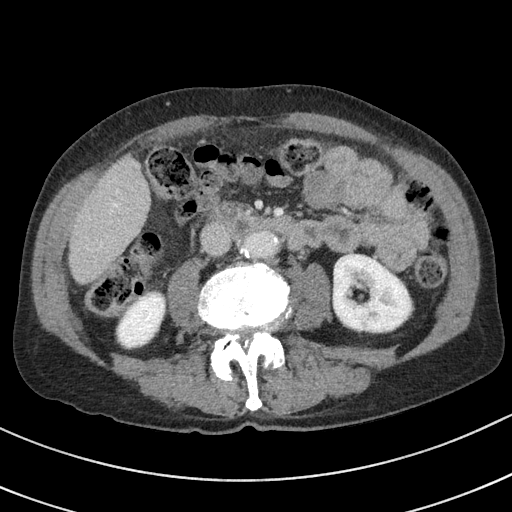
[im 18/70  lung]
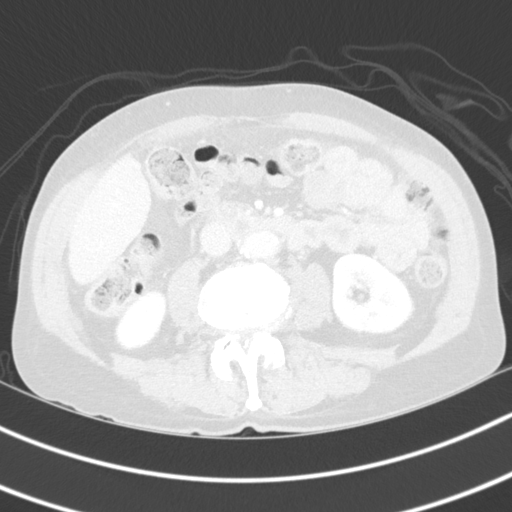
[im 18/70  bone]
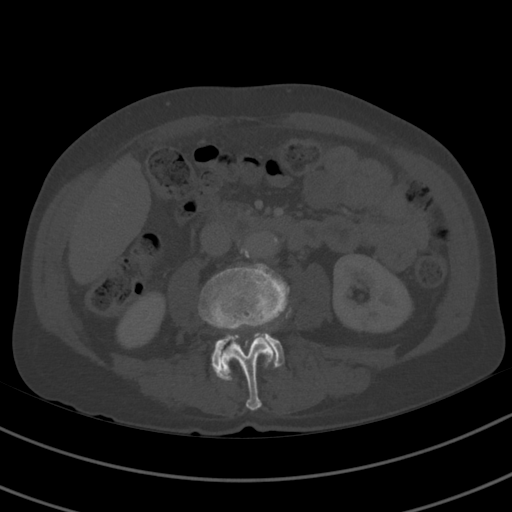
[im 35/70  soft-tissue]
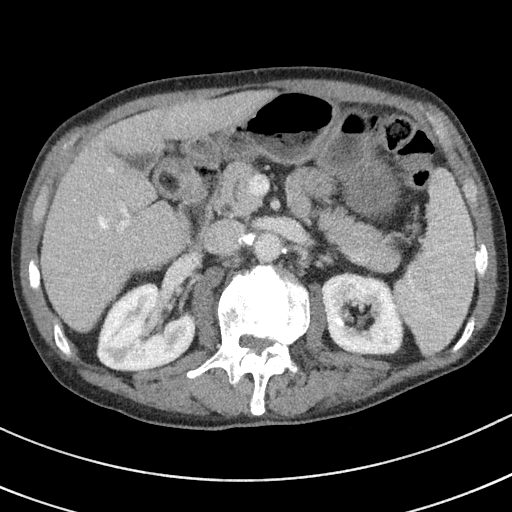
[im 35/70  lung]
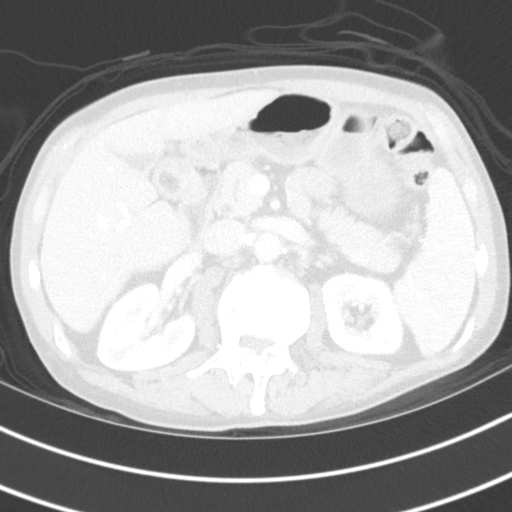
[im 52/70  soft-tissue]
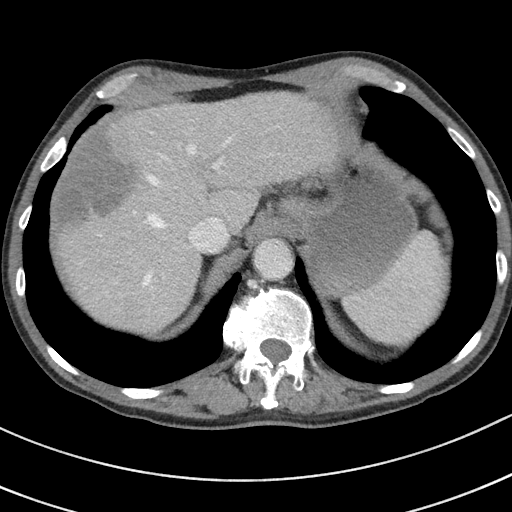
[im 52/70  lung]
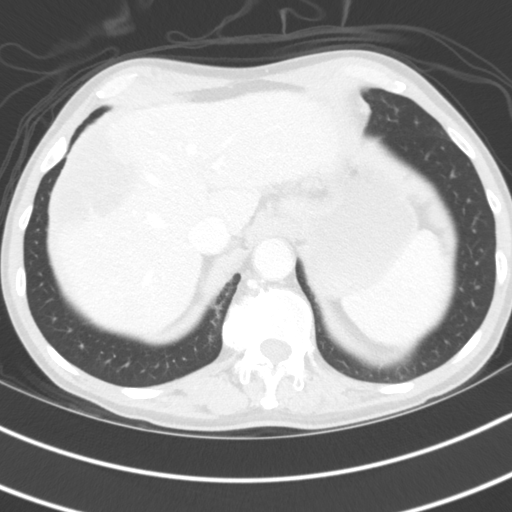

[Series 15: axial arterial · axial · arterial · 0.67mm/px · z∈[-220,-121]mm · 3 of 67 slices shown (2 of 2)]
[im 17/67  soft-tissue]
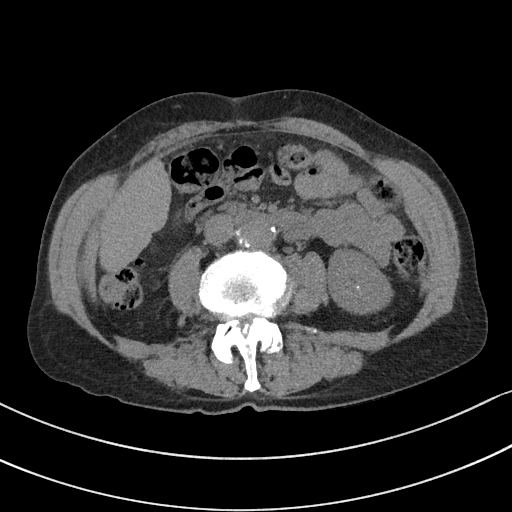
[im 34/67  soft-tissue]
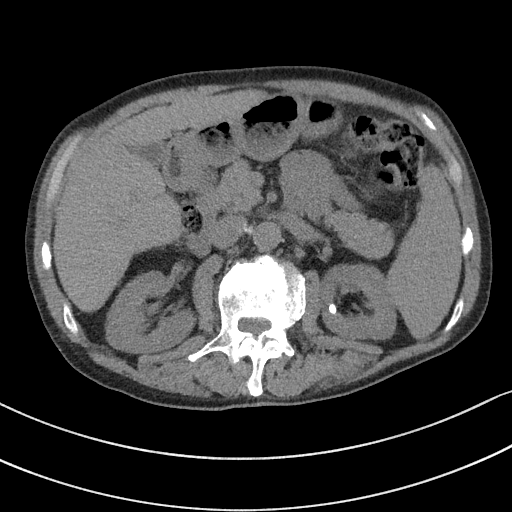
[im 50/67  soft-tissue]
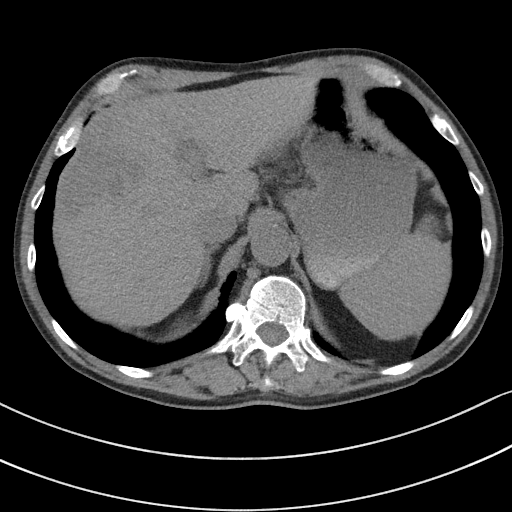

[11 of 46 positions shown; findings below may reference images not displayed]

FINDINGS: Lower chest: Lung bases are clear. No sign of effusion or evidence
of consolidative change.

Hepatobiliary: Dilation. Signs of hepatic cirrhosis with evidence of
portal hypertension. Patent portal vein. Classic hepatic arterial
anatomy.

Observation 1: Marked interval decrease in size of large RIGHT
hepatic lobe lesion now measuring approximately 5.4 x 7.5 cm as
compared to 9.6 x 6.7 cm. No LEFT hepatic lobe lesion. Small 11 mm
arterial enhancing focus near the dome of the RIGHT hemi liver
within the lesion. Phase venous enhancement also near the central
portion of the lesion.

Observation 2: Small focus in the posterior RIGHT hepatic lobe
measuring approximately 9 to 10 mm, previously approximately 13 mm.
No new hepatic lesion. No pericholecystic stranding or biliary duct

Pancreas: Normal, without mass, inflammation or ductal dilatation.

Spleen: Spleen normal size and contour.

Adrenals/Urinary Tract: Normal adrenal glands. Signs of
nephrolithiasis calculus on the LEFT measuring approximately 7 mm.
No hydronephrosis. No perinephric stranding. No suspicious renal
lesion.

Stomach/Bowel: Unremarkable aside from colonic diverticulosis to the
extent evaluated.

Vascular/Lymphatic: Calcified atheromatous plaque and noncalcified
plaque in the abdominal aorta. No aneurysmal dilation. Smooth
contour the IVC. There is no gastrohepatic or hepatoduodenal
ligament lymphadenopathy. No retroperitoneal or mesenteric
lymphadenopathy.

Other: No ascites

Musculoskeletal: No acute bone finding. No destructive bone process.
Spinal degenerative changes with levoconvex curvature of the lumbar
spine, mild and unchanged.
IMPRESSION: Marked interval decrease in size of the large RIGHT hepatic lobe
lesion, biopsy-proven hepatocellular carcinoma. No arterial phase
enhancement on early phase, vague enhancement on later phase greater
than 20 Hounsfield unit enhancement on venous phase. Less pronounced
arterial enhancement than on previous imaging with small peripheral
foci of enhancement on arterial phase particularly along the margin
of the tumor near the dome of the liver (image [DATE]) 11 mm focus of
enhancement in this area.

Interval response to therapy of a small lesion previously compatible
with OLIVER category 5 lesion in the posterior RIGHT hepatic lobe.

No new hepatic lesion.

Signs of hepatic cirrhosis with evidence of portal hypertension.

Signs of nephrolithiasis calculus on the LEFT measuring
approximately 7 mm.

Colonic diverticulosis without evidence of acute diverticulitis.

Aortic Atherosclerosis ([NF]-[NF]).

## 2021-06-16 MED ORDER — IOHEXOL 350 MG/ML SOLN
80.0000 mL | Freq: Once | INTRAVENOUS | Status: AC | PRN
  Administered 2021-06-16: 80 mL via INTRAVENOUS

## 2021-06-23 ENCOUNTER — Encounter: Payer: Self-pay | Admitting: Hematology

## 2021-06-30 ENCOUNTER — Inpatient Hospital Stay: Payer: Medicare Other

## 2021-06-30 ENCOUNTER — Other Ambulatory Visit: Payer: Self-pay | Admitting: Physician Assistant

## 2021-06-30 ENCOUNTER — Inpatient Hospital Stay: Payer: Medicare Other | Attending: Hematology | Admitting: Hematology

## 2021-06-30 ENCOUNTER — Other Ambulatory Visit: Payer: Self-pay

## 2021-06-30 ENCOUNTER — Encounter: Payer: Self-pay | Admitting: Hematology

## 2021-06-30 VITALS — BP 152/72 | HR 62 | Temp 98.2°F | Resp 18

## 2021-06-30 VITALS — BP 171/81 | HR 63 | Temp 98.2°F | Resp 17 | Ht 65.0 in | Wt 132.7 lb

## 2021-06-30 DIAGNOSIS — Z79899 Other long term (current) drug therapy: Secondary | ICD-10-CM | POA: Diagnosis not present

## 2021-06-30 DIAGNOSIS — I2510 Atherosclerotic heart disease of native coronary artery without angina pectoris: Secondary | ICD-10-CM | POA: Diagnosis not present

## 2021-06-30 DIAGNOSIS — R634 Abnormal weight loss: Secondary | ICD-10-CM | POA: Insufficient documentation

## 2021-06-30 DIAGNOSIS — I700 Atherosclerosis of aorta: Secondary | ICD-10-CM | POA: Insufficient documentation

## 2021-06-30 DIAGNOSIS — Z5112 Encounter for antineoplastic immunotherapy: Secondary | ICD-10-CM | POA: Diagnosis present

## 2021-06-30 DIAGNOSIS — N200 Calculus of kidney: Secondary | ICD-10-CM | POA: Diagnosis not present

## 2021-06-30 DIAGNOSIS — C220 Liver cell carcinoma: Secondary | ICD-10-CM | POA: Insufficient documentation

## 2021-06-30 DIAGNOSIS — M25519 Pain in unspecified shoulder: Secondary | ICD-10-CM | POA: Insufficient documentation

## 2021-06-30 LAB — CBC WITH DIFFERENTIAL (CANCER CENTER ONLY)
Abs Immature Granulocytes: 0.01 10*3/uL (ref 0.00–0.07)
Basophils Absolute: 0 10*3/uL (ref 0.0–0.1)
Basophils Relative: 1 %
Eosinophils Absolute: 0.1 10*3/uL (ref 0.0–0.5)
Eosinophils Relative: 3 %
HCT: 40.5 % (ref 39.0–52.0)
Hemoglobin: 14 g/dL (ref 13.0–17.0)
Immature Granulocytes: 0 %
Lymphocytes Relative: 47 %
Lymphs Abs: 2.3 10*3/uL (ref 0.7–4.0)
MCH: 31.4 pg (ref 26.0–34.0)
MCHC: 34.6 g/dL (ref 30.0–36.0)
MCV: 90.8 fL (ref 80.0–100.0)
Monocytes Absolute: 0.6 10*3/uL (ref 0.1–1.0)
Monocytes Relative: 13 %
Neutro Abs: 1.7 10*3/uL (ref 1.7–7.7)
Neutrophils Relative %: 36 %
Platelet Count: 100 10*3/uL — ABNORMAL LOW (ref 150–400)
RBC: 4.46 MIL/uL (ref 4.22–5.81)
RDW: 12.4 % (ref 11.5–15.5)
WBC Count: 4.8 10*3/uL (ref 4.0–10.5)
nRBC: 0 % (ref 0.0–0.2)

## 2021-06-30 LAB — CMP (CANCER CENTER ONLY)
ALT: 47 U/L — ABNORMAL HIGH (ref 0–44)
AST: 58 U/L — ABNORMAL HIGH (ref 15–41)
Albumin: 3.8 g/dL (ref 3.5–5.0)
Alkaline Phosphatase: 58 U/L (ref 38–126)
Anion gap: 10 (ref 5–15)
BUN: 12 mg/dL (ref 8–23)
CO2: 24 mmol/L (ref 22–32)
Calcium: 9.8 mg/dL (ref 8.9–10.3)
Chloride: 105 mmol/L (ref 98–111)
Creatinine: 0.77 mg/dL (ref 0.61–1.24)
GFR, Estimated: >60 mL/min (ref >60)
Glucose, Bld: 95 mg/dL (ref 70–99)
Potassium: 4 mmol/L (ref 3.5–5.1)
Sodium: 139 mmol/L (ref 135–145)
Total Bilirubin: 0.5 mg/dL (ref 0.3–1.2)
Total Protein: 8.4 g/dL — ABNORMAL HIGH (ref 6.5–8.1)

## 2021-06-30 MED ORDER — SODIUM CHLORIDE 0.9 % IV SOLN: Freq: Once | INTRAVENOUS | Status: DC

## 2021-06-30 MED ORDER — SODIUM CHLORIDE 0.9 % IV SOLN
1200.0000 mg | Freq: Once | INTRAVENOUS | Status: AC
  Administered 2021-06-30: 1200 mg via INTRAVENOUS
  Filled 2021-06-30: qty 20

## 2021-06-30 NOTE — Progress Notes (Signed)
[place]   Telephone:(336) [phone] Fax:(336) [phone]   Clinic Follow up Note   Patient Care Team: Pcp, No as PCP - General [name], MD as Consulting Physician (Hematology and Oncology) [name], RN (Inactive) as Oncology Nurse Navigator  Date of Service:  06/30/2021  CHIEF COMPLAINT: f/u of liver cancer  CURRENT THERAPY:  -Atezolizumab and bevacizumab q 3 weeks, Beva held before and after Y90  -Y90 scheduled for 07/01/21 and 07/22/21, will hold beva before and after  ASSESSMENT & PLAN:  Shawn Bailey is a 75 y.o. male with   1.  Multifocal hepatocellular carcinoma, cT3N0M, stage IIIA -Liver MRI 03/06/21 showed a large 9.5 cm and a 1.3 cm liver mass is seen in right lobe, biopsy 03/08/21 confirmed moderately differentiated hepatocellular carcinoma.  No nodal or distant metastasis on CT 03/05/21. -This is likely related to his HCV infection -He is not a candidate for liver resection or transplant. Dr. [name] at [facility] did not recommend surgery given the location and extent of disease. -He started atezolizumab and bevacizumab on 04/28/21, while waiting for liver radioembolization. He tolerates this well with mild fatigue. He overall feels better after started treatment  -His AFP has dropped significantly which likely indicating good response  -he is scheduled for Y90 with procedures on 07/01/21 and 07/22/21.  -labs reviewed, overall adequate for treatment. He will proceed with atezolizumab alone again today, as we are holding beva during his Y90   2. Untreated HCV  -During 02/2021 Hospitalization, labs showed HIV non reactive and hepatitis panel hep C ab positive, he was not aware that he has HCV infection -due to his recent [facility] diagnosis, will hold off HCV treatment for now -he does not see any doctors regularly. I previously referred him to the liver clinic for evaluation of his cancer, HCV, and for endoscopy when needed. This has not yet been scheduled.    3. Weight loss, decreased appetite, and fatigue -Secondary to liver cancer -he has diminished appetite and taste changes, causing weight loss. He has gained some weight back over the last three weeks. -He will continue follow with our nutritionist as needed     PLAN: -proceed with atezolizumab alone today -labs, f/u, and atezolizumab in 3 weeks -he is scheduled with IR for Y90 mapping tomorrow, 07/01/21, and treatment on 07/22/21.    No problem-specific Assessment & Plan notes found for this encounter.   SUMMARY OF ONCOLOGIC HISTORY: Oncology History Overview Note  Cancer Staging Hepatocellular carcinoma [facility]) Staging form: Liver, AJCC 8th Edition - Clinical stage from 03/08/2021: Stage IIIA (cT3, cN0, cM0) - Signed by [name], MD on 03/11/2021 Stage prefix: Initial diagnosis Histologic grade (G): G2 Histologic grading system: 4 grade system     Hepatocellular carcinoma ([place])   Initial Diagnosis   Liver mass, right lobe   03/05/2021 Imaging   CT CAP  IMPRESSION: 1. Right hepatic lobe masses which are suspicious for either metastatic disease or primary malignancy such as hepatocellular carcinoma. These are suboptimally evaluated on this noncontrast exam. Consider multidisciplinary oncology consultation with potential clinical strategies of tissue sampling versus PET to direct sampling. 2. No primary malignancy identified within the chest, abdomen, or pelvis, given limitation of noncontrast exam. 3. Left nephrolithiasis. 4. Coronary artery atherosclerosis. Aortic Atherosclerosis (ICD10-I70.0). 5. Esophageal air fluid level suggests dysmotility or gastroesophageal reflux.   03/06/2021 Imaging   MRI Liver  IMPRESSION: 9.5 cm heterogeneous hypovascular mass in anterior right hepatic lobe, and 1.3 cm hypervascular mass in the  posterior right hepatic lobe. Differential diagnosis includes liver metastases and multifocal hepatocellular carcinoma. Suggest correlation with  serum AFP level, and possible tissue sampling.   No evidence of abdominal metastatic disease.   03/08/2021 Initial Biopsy   A. LIVER, RIGHT, BIOPSY:  - Hepatocellular carcinoma.   COMMENT:  The hepatocellular carcinoma is moderately differentiated and associated with areas of necrosis.  Immunohistochemistry shows positivity with glypican-3 and weak staining with HepPar 1.  The tumor is negative with arginase 1, alpha-fetoprotein, CDX2, cytokeratin 7, cytokeratin 20, cytokeratin AE1/AE3, PSA, prostein and MOC31.  Polyclonal CEA shows focal canalicular pattern.  The morphology and immunophenotype are consistent with hepatocellular carcinoma.    03/08/2021 Cancer Staging   Staging form: Liver, AJCC 8th Edition - Clinical stage from 03/08/2021: Stage IIIA (cT3, cN0, cM0) - Signed by [name], MD on 03/11/2021 Stage prefix: Initial diagnosis Histologic grade (G): G2 Histologic grading system: 4 grade system   04/28/2021 -  Chemotherapy    Patient is on Treatment Plan: LUNG ATEZOLIZUMAB + BEVACIZUMAB Q21D MAINTENANCE       06/16/2021 Imaging   CT Abdomen  IMPRESSION: Marked interval decrease in size of the large RIGHT hepatic lobe lesion, biopsy-proven hepatocellular carcinoma. No arterial phase enhancement on early phase, vague enhancement on later phase greater than 20 Hounsfield unit enhancement on venous phase. Less pronounced arterial enhancement than on previous imaging with small peripheral foci of enhancement on arterial phase particularly along the margin of the tumor near the dome of the liver (image 12/2) 11 mm focus of enhancement in this area.   Interval response to therapy of a small lesion previously compatible with LI-RADS category 5 lesion in the posterior RIGHT hepatic lobe.   No new hepatic lesion.   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   Signs of nephrolithiasis calculus on the LEFT measuring approximately 7 mm.   Colonic diverticulosis  without evidence of acute diverticulitis.   Aortic Atherosclerosis (ICD10-I70.0).      INTERVAL HISTORY:  Shawn Bailey is here for a follow up of liver cancer. He was last seen by me on 06/10/21. He presents to the clinic accompanied by his ex-wife and her husband. She reports he is doing well, and his eating is improving. He is supplementing with Boost and Ensure (alternating). He denies pain, aside from chronic shoulder pain (not new or changed).   All other systems were reviewed with the patient and are negative.  MEDICAL HISTORY:  Past Medical History:  Diagnosis Date   Depression    Hepatocellular carcinoma ([place]) 03/08/2021   Parasite infestation     SURGICAL HISTORY: Past Surgical History:  Procedure Laterality Date   IR RADIOLOGIST EVAL & MGMT  06/01/2021   NO PAST SURGERIES      I have reviewed the social history and family history with the patient and they are unchanged from previous note.  ALLERGIES:  has No Known Allergies.  MEDICATIONS:  Current Outpatient Medications  Medication Sig Dispense Refill   Ascorbic Acid (VITAMIN C) 100 MG tablet Take 100 mg by mouth daily.     HYDROcodone-acetaminophen (NORCO) 5-325 MG tablet Take 1 tablet by mouth every 6 (six) hours as needed for moderate pain. 20 tablet 0   Multiple Vitamin (MULTIVITAMIN WITH MINERALS) TABS tablet Take 1 tablet by mouth daily.     ondansetron (ZOFRAN) 8 MG tablet Take 1 tablet (8 mg total) by mouth every 8 (eight) hours as needed for nausea or vomiting. 30 tablet 0  polyethylene glycol (MIRALAX / GLYCOLAX) 17 g packet Take 17 g by mouth daily as needed for mild constipation. 14 each 0   prochlorperazine (COMPAZINE) 10 MG tablet Take 1 tablet (10 mg total) by mouth every 6 (six) hours as needed for nausea or vomiting. 30 tablet 0   senna (SENOKOT) 8.6 MG TABS tablet Take 1 tablet (8.6 mg total) by mouth 2 (two) times daily. 120 tablet 0   vitamin B-12 (CYANOCOBALAMIN) 100 MCG tablet Take 100  mcg by mouth daily.     No current facility-administered medications for this visit.   Facility-Administered Medications Ordered in Other Visits  Medication Dose Route Frequency Provider Last Rate Last Admin   0.9 %  sodium chloride infusion   Intravenous Once [name], MD        PHYSICAL EXAMINATION: ECOG PERFORMANCE STATUS: 1 - Symptomatic but completely ambulatory  Vitals:   06/30/21 1230  BP: (!) 171/81  Pulse: 63  Resp: 17  Temp: 98.2 F (36.8 C)  SpO2: 100%   Wt Readings from Last 3 Encounters:  06/30/21 132 lb 11.2 oz (60.2 kg)  06/10/21 128 lb 11.2 oz (58.4 kg)  05/19/21 126 lb 8 oz (57.4 kg)     GENERAL:alert, no distress and comfortable SKIN: skin color, texture, turgor are normal, no rashes or significant lesions EYES: normal, Conjunctiva are pink and non-injected, sclera clear  NECK: supple, thyroid normal size, non-tender, without nodularity LYMPH:  no palpable lymphadenopathy in the cervical, axillary  LUNGS: clear to auscultation and percussion with normal breathing effort HEART: regular rate & rhythm and no murmurs and no lower extremity edema ABDOMEN:abdomen soft, non-tender and normal bowel sounds Musculoskeletal:no cyanosis of digits and no clubbing  NEURO: alert & oriented x 3 with fluent speech, no focal motor/sensory deficits  LABORATORY DATA:  I have reviewed the data as listed CBC Latest Ref Rng & Units 06/30/2021 06/10/2021 05/19/2021  WBC 4.0 - 10.5 K/uL 4.8 4.3 6.3  Hemoglobin 13.0 - 17.0 g/dL 14.0 13.4 13.6  Hematocrit 39.0 - 52.0 % 40.5 38.2(L) 39.4  Platelets 150 - 400 K/uL 100(L) 108(L) 148(L)     CMP Latest Ref Rng & Units 06/30/2021 06/10/2021 05/19/2021  Glucose 70 - 99 mg/dL 95 91 80  BUN 8 - 23 mg/dL '12 15 15  '$ Creatinine 0.61 - 1.24 mg/dL 0.77 0.76 0.72  Sodium 135 - 145 mmol/L 139 136 135  Potassium 3.5 - 5.1 mmol/L 4.0 4.0 3.8  Chloride 98 - 111 mmol/L 105 106 102  CO2 22 - 32 mmol/L '24 23 23  '$ Calcium 8.9 - 10.3 mg/dL 9.8 9.5 9.2   Total Protein 6.5 - 8.1 g/dL 8.4(H) 8.0 7.9  Total Bilirubin 0.3 - 1.2 mg/dL 0.5 0.5 0.5  Alkaline Phos 38 - 126 U/L 58 64 62  AST 15 - 41 U/L 58(H) 24 159(H)  ALT 0 - 44 U/L 47(H) 18 200(H)      RADIOGRAPHIC STUDIES: I have personally reviewed the radiological images as listed and agreed with the findings in the report. No results found.    No orders of the defined types were placed in this encounter.  All questions were answered. The patient knows to call the clinic with any problems, questions or concerns. No barriers to learning was detected. The total time spent in the appointment was 30 minutes.     [name], MD 06/30/2021   I, Wilburn Mylar, am acting as scribe for [name], MD.   I have reviewed the above  documentation for accuracy and completeness, and I agree with the above.

## 2021-06-30 NOTE — Patient Instructions (Signed)
[facility]  Discharge Instructions: Thank you for choosing [facility] to provide your oncology and hematology care.   If you have a lab appointment with the Cancer Center, please go directly to the Cancer Center and check in at the registration area.   Wear comfortable clothing and clothing appropriate for easy access to any Portacath or PICC line.   We strive to give you quality time with your provider. You may need to reschedule your appointment if you arrive late (15 or more minutes).  Arriving late affects you and other patients whose appointments are after yours.  Also, if you miss three or more appointments without notifying the office, you may be dismissed from the clinic at the provider's discretion.      For prescription refill requests, have your pharmacy contact our office and allow 72 hours for refills to be completed.    Today you received the following chemotherapy and/or immunotherapy agents: tecentriq      To help prevent nausea and vomiting after your treatment, we encourage you to take your nausea medication as directed.  BELOW ARE SYMPTOMS THAT SHOULD BE REPORTED IMMEDIATELY: *FEVER GREATER THAN 100.4 F (38 C) OR HIGHER *CHILLS OR SWEATING *NAUSEA AND VOMITING THAT IS NOT CONTROLLED WITH YOUR NAUSEA MEDICATION *UNUSUAL SHORTNESS OF BREATH *UNUSUAL BRUISING OR BLEEDING *URINARY PROBLEMS (pain or burning when urinating, or frequent urination) *BOWEL PROBLEMS (unusual diarrhea, constipation, pain near the anus) TENDERNESS IN MOUTH AND THROAT WITH OR WITHOUT PRESENCE OF ULCERS (sore throat, sores in mouth, or a toothache) UNUSUAL RASH, SWELLING OR PAIN  UNUSUAL VAGINAL DISCHARGE OR ITCHING   Items with * indicate a potential emergency and should be followed up as soon as possible or go to the Emergency Department if any problems should occur.  Please show the CHEMOTHERAPY ALERT CARD or IMMUNOTHERAPY ALERT CARD at check-in to  the Emergency Department and triage nurse.  Should you have questions after your visit or need to cancel or reschedule your appointment, please contact [facility]  Dept: [phone]  and follow the prompts.  Office hours are 8:00 a.m. to 4:30 p.m. Monday - Friday. Please note that voicemails left after 4:00 p.m. may not be returned until the following business day.  We are closed weekends and major holidays. You have access to a nurse at all times for urgent questions. Please call the main number to the clinic Dept: [phone] and follow the prompts.   For any non-urgent questions, you may also contact your provider using MyChart. We now offer e-Visits for anyone 18 and older to request care online for non-urgent symptoms. For details visit [email or web address].   Also download the MyChart app! Go to the app store, search "MyChart", open the app, select [facility], and log in with your MyChart username and password.  Due to Covid, a mask is required upon entering the hospital/clinic. If you do not have a mask, one will be given to you upon arrival. For doctor visits, patients may have 1 support person aged 18 or older with them. For treatment visits, patients cannot have anyone with them due to current Covid guidelines and our immunocompromised population.   

## 2021-07-01 ENCOUNTER — Encounter (HOSPITAL_COMMUNITY)
Admission: RE | Admit: 2021-07-01 | Discharge: 2021-07-01 | Disposition: A | Discharge status: Home / Self Care | Payer: Medicare Other | Source: Ambulatory Visit | Attending: Interventional Radiology | Admitting: Interventional Radiology

## 2021-07-01 ENCOUNTER — Other Ambulatory Visit: Payer: Self-pay

## 2021-07-01 ENCOUNTER — Other Ambulatory Visit (HOSPITAL_COMMUNITY): Payer: Self-pay | Admitting: Interventional Radiology

## 2021-07-01 ENCOUNTER — Ambulatory Visit (HOSPITAL_COMMUNITY)
Admission: RE | Admit: 2021-07-01 | Discharge: 2021-07-01 | Disposition: A | Discharge status: Home / Self Care | Payer: Medicare Other | Source: Ambulatory Visit | Attending: Interventional Radiology | Admitting: Interventional Radiology

## 2021-07-01 ENCOUNTER — Encounter (HOSPITAL_COMMUNITY): Payer: Self-pay

## 2021-07-01 DIAGNOSIS — C220 Liver cell carcinoma: Secondary | ICD-10-CM

## 2021-07-01 DIAGNOSIS — Z79899 Other long term (current) drug therapy: Secondary | ICD-10-CM | POA: Diagnosis not present

## 2021-07-01 HISTORY — PX: IR ANGIOGRAM SELECTIVE EACH ADDITIONAL VESSEL: IMG667

## 2021-07-01 HISTORY — PX: IR EMBO ARTERIAL NOT HEMORR HEMANG INC GUIDE ROADMAPPING: IMG5448

## 2021-07-01 HISTORY — PX: IR US GUIDE VASC ACCESS RIGHT: IMG2390

## 2021-07-01 HISTORY — PX: IR ANGIOGRAM VISCERAL SELECTIVE: IMG657

## 2021-07-01 LAB — COMPREHENSIVE METABOLIC PANEL
ALT: 55 U/L — ABNORMAL HIGH (ref 0–44)
AST: 78 U/L — ABNORMAL HIGH (ref 15–41)
Albumin: 4.1 g/dL (ref 3.5–5.0)
Alkaline Phosphatase: 52 U/L (ref 38–126)
Anion gap: 10 (ref 5–15)
BUN: 14 mg/dL (ref 8–23)
CO2: 25 mmol/L (ref 22–32)
Calcium: 9.9 mg/dL (ref 8.9–10.3)
Chloride: 104 mmol/L (ref 98–111)
Creatinine, Ser: 0.73 mg/dL (ref 0.61–1.24)
GFR, Estimated: >60 mL/min (ref >60)
Glucose, Bld: 98 mg/dL (ref 70–99)
Potassium: 4.9 mmol/L (ref 3.5–5.1)
Sodium: 139 mmol/L (ref 135–145)
Total Bilirubin: 1.1 mg/dL (ref 0.3–1.2)
Total Protein: 8.8 g/dL — ABNORMAL HIGH (ref 6.5–8.1)

## 2021-07-01 LAB — PROTIME-INR
INR: 1 (ref 0.8–1.2)
Prothrombin Time: 13 seconds (ref 11.4–15.2)

## 2021-07-01 IMAGING — NM NM LIVER IMG SPECT
3 series · 18 of 18 positions shown · non-contrast
Comparison: CT [DATE]

CLINICAL DATA: Hepatocellular carcinoma. Primarily chemotherapy.
Pre yttrium 90 radioembolization mapping.

EXAM:
NUCLEAR MEDICINE LIVER SCAN
TECHNIQUE: Abdominal images were obtained in multiple projections after
intrahepatic arterial injection of radiopharmaceutical. SPECT
imaging was performed. Lung shunt calculation was performed.
RADIOPHARMACEUTICALS:  [IM] MAA TECHNETIUM TO 99M ALBUMIN
AGGREGATED

[Series 1: spect - (id) _(id)_cor · 4.1mm · 4.14mm/px · 6 of 128 frames shown]
[frame 11/128]
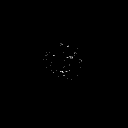
[frame 32/128]
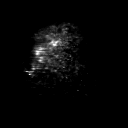
[frame 54/128]
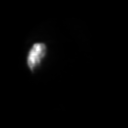
[frame 75/128]
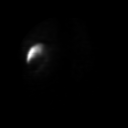
[frame 96/128]
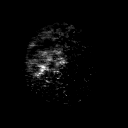
[frame 118/128]
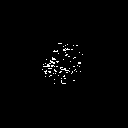

[Series 1: spect - (id) _(id)_tra · 4.1mm · 4.14mm/px · 6 of 128 frames shown]
[frame 11/128]
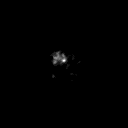
[frame 32/128]
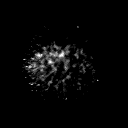
[frame 54/128]
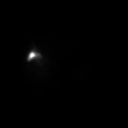
[frame 75/128]
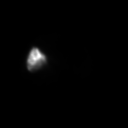
[frame 96/128]
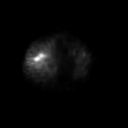
[frame 118/128]
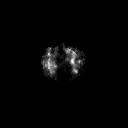

[Series 2: maa spect · 4.14mm/px · 6 of 64 frames shown]
[frame 6/64]
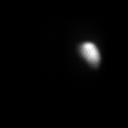
[frame 16/64]
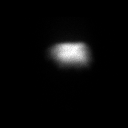
[frame 27/64]
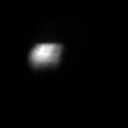
[frame 38/64]
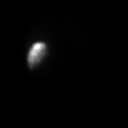
[frame 48/64]
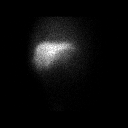
[frame 59/64]
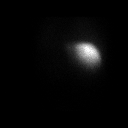

[18 of 18 positions shown; findings below may reference images not displayed]

FINDINGS: The injected microaggregated albumin localizes within RIGHT hepatic
lobe. No evidence of activity within the stomach, duodenum, or
bowel.

Calculated shunt fraction to the lungs equals 10.0%.
IMPRESSION: 1. No significant extrahepatic radiotracer activity following
intrahepatic arterial injection of MAA.
2. Lung shunt fraction equals 10.0%

## 2021-07-01 IMAGING — XA IR EMBO ARTERIAL NOT [PERSON_NAME] INC GUIDE ROADMAPPING
3 of 4 series · 12 of 24 positions shown · non-contrast
Comparison: none

INDICATION: 75-year-old male with HCV cirrhosis complicated by right-sided
hepatocellular carcinoma. He was initially treated with
immunotherapy (Tecentriq plus Avastin) and has had a very good
initial response. His primary lesion has significantly decreased in
size and he is now a candidate for radiation segmentectomy. He
presents for pre [AGE] planning.

[Series 1: ir embo arterial not (person_name) inc g · arterial · 2 acquisitions, 1 frame shown]
[im 1/2]
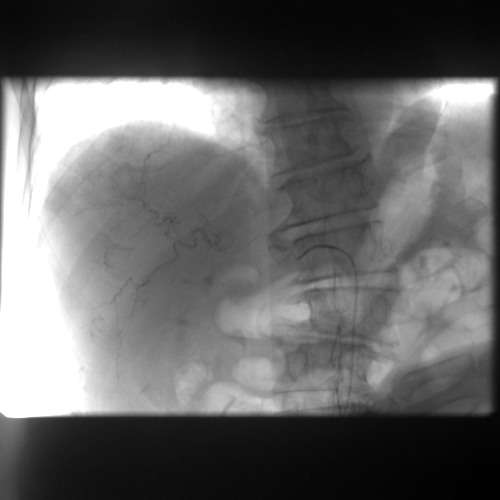

[Series 1: processed: ir embo arterial not (person_ · arterial · 8 acquisitions, 2 frames shown]
[im 1/8]
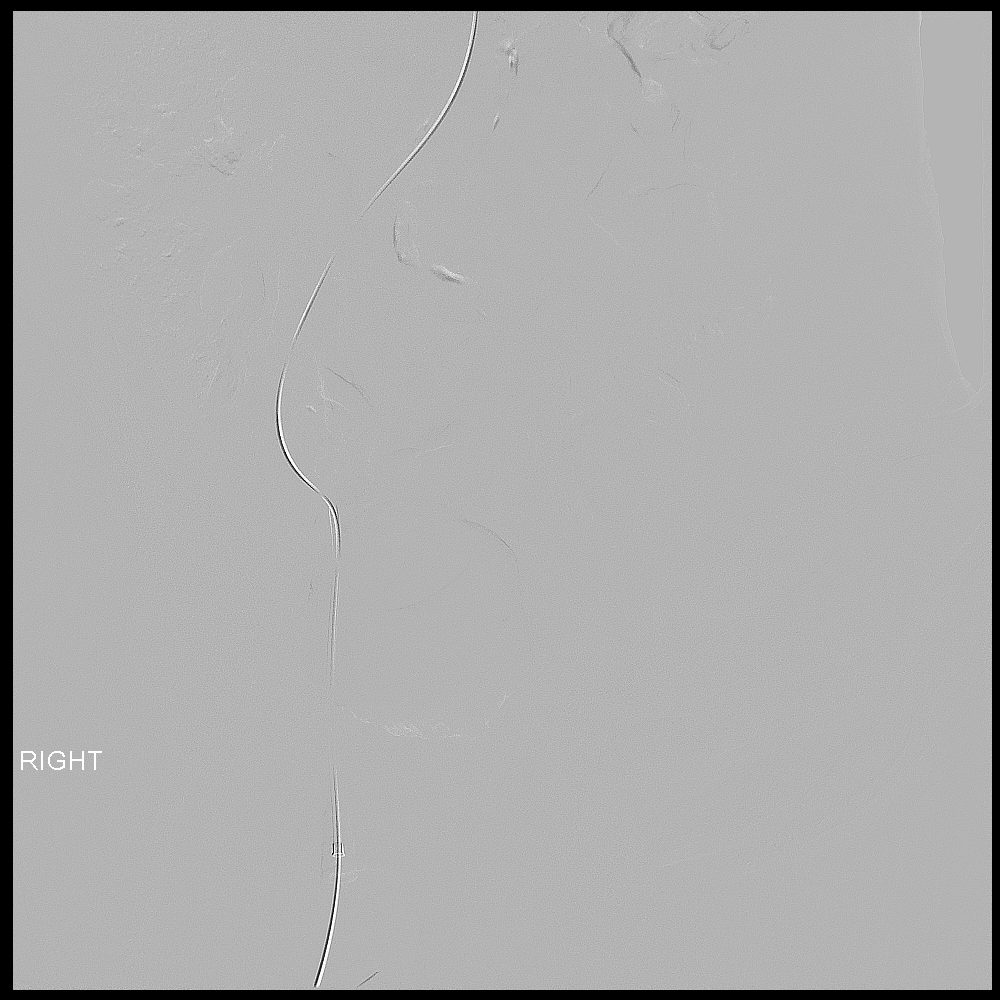
[im 1/8]
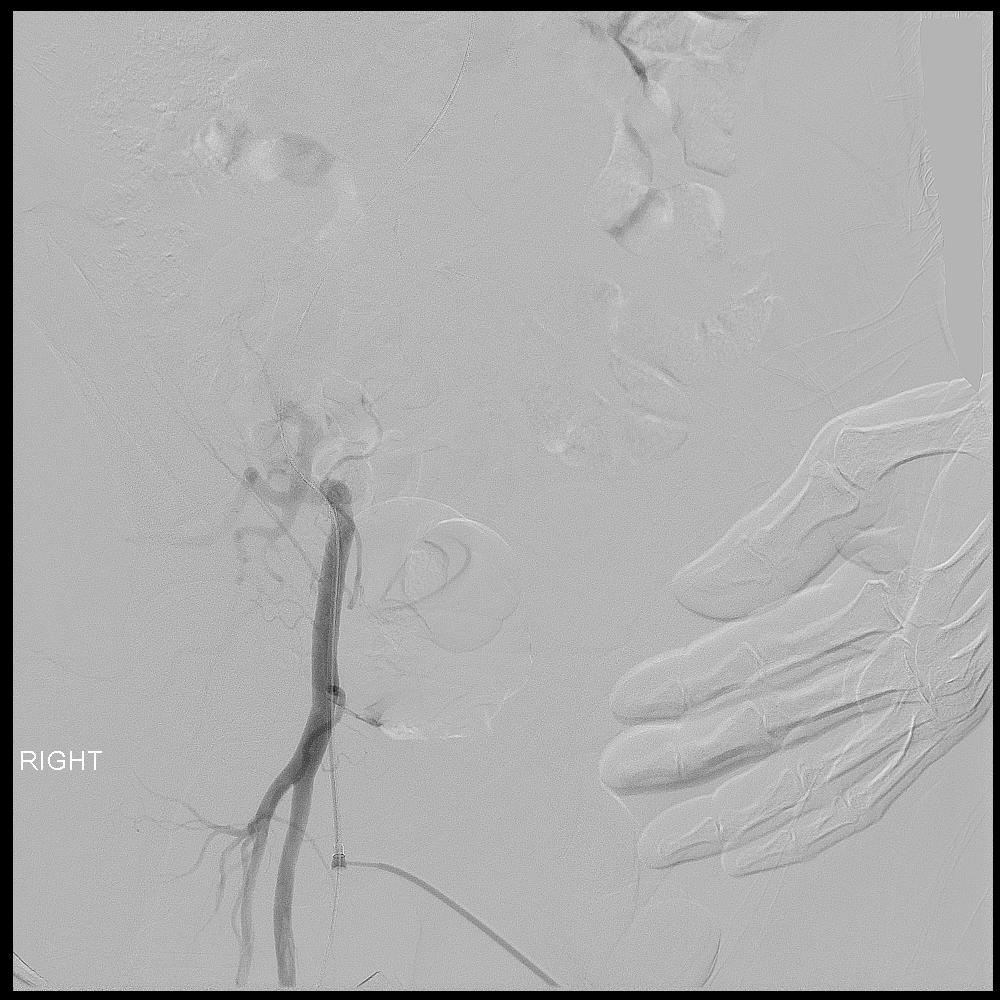

[Series 1050: axials · axial · 1.9mm · 0.48mm/px · z∈[-681,-528]mm · 9 of 85 slices shown]
[im 5/85]
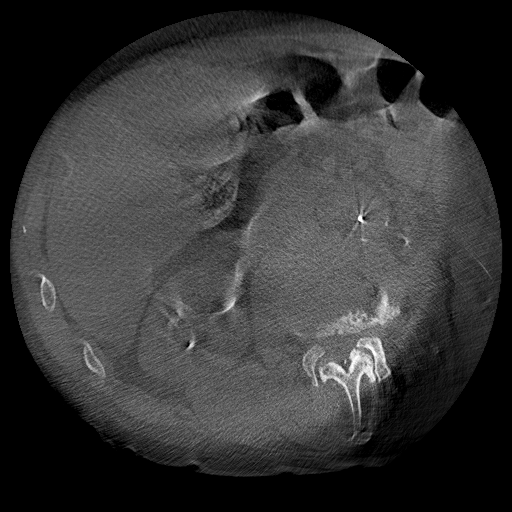
[im 13/85]
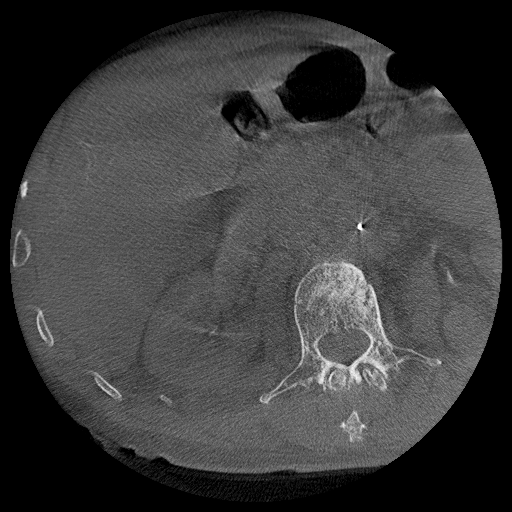
[im 22/85]
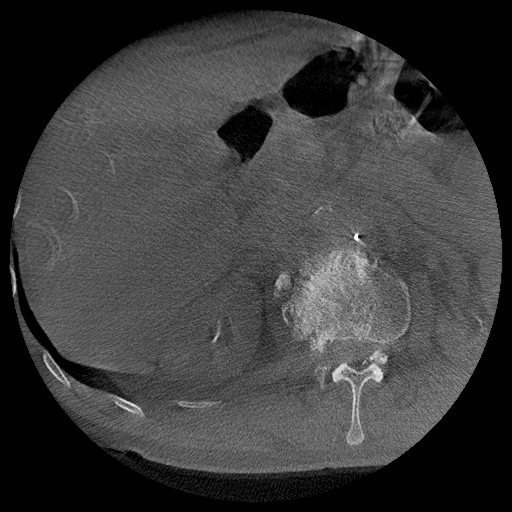
[im 34/85]
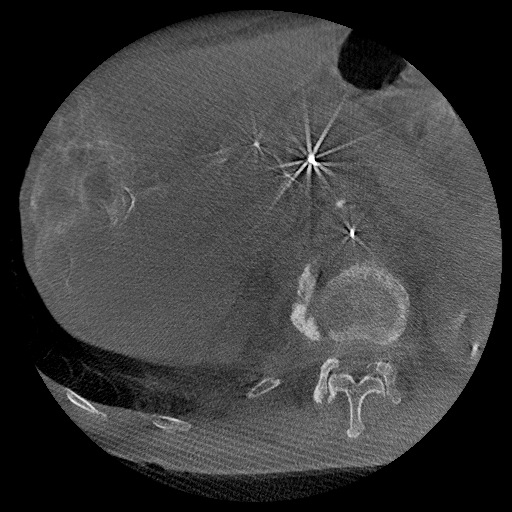
[im 43/85]
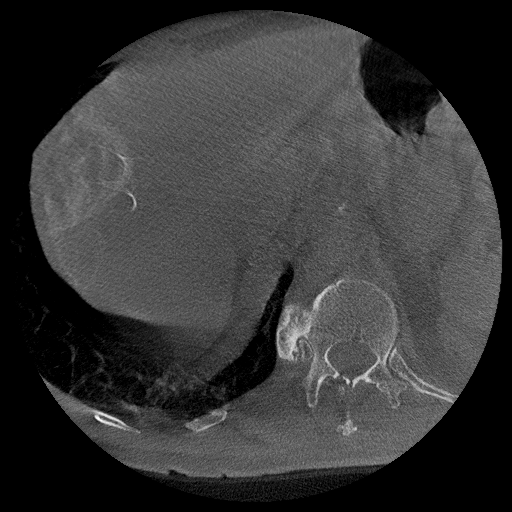
[im 55/85]
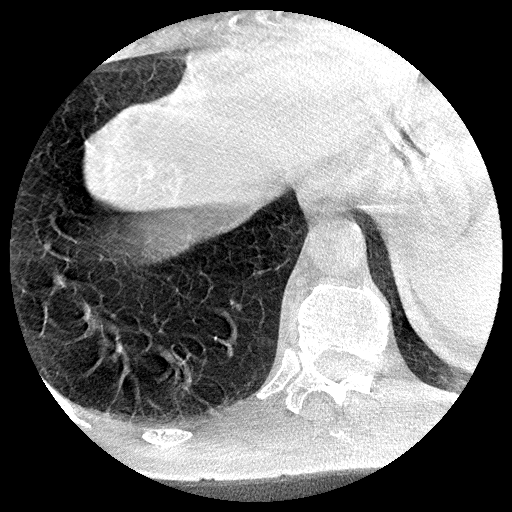
[im 64/85]
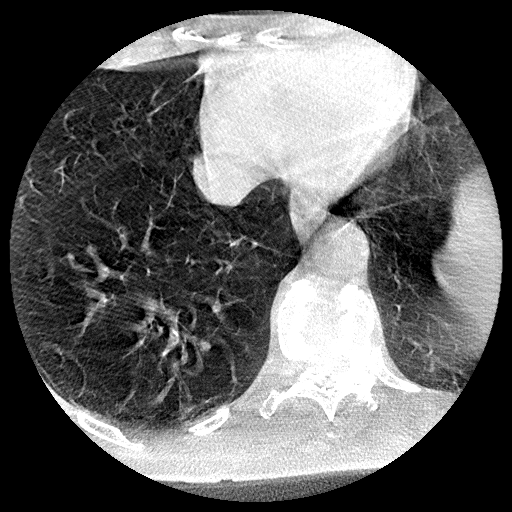
[im 76/85]
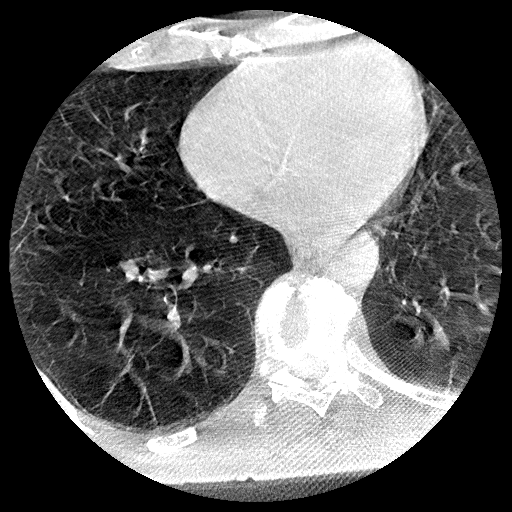
[im 85/85]
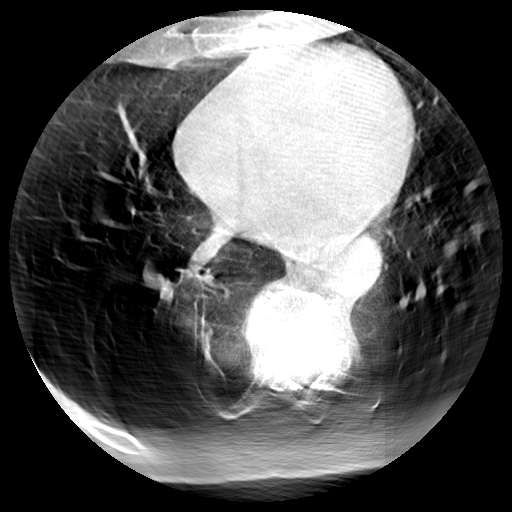

[12 of 24 positions shown; findings below may reference images not displayed]

EXAM:
IR EMBO ARTERIAL NOT HEMORR TIGER INC GUIDE ROADMAPPING; SELECTIVE
VISCERAL ARTERIOGRAPHY; ADDITIONAL ARTERIOGRAPHY; IR ULTRASOUND
GUIDANCE VASC ACCESS RIGHT

MEDICATIONS:
None.

ANESTHESIA/SEDATION:
Moderate (conscious) sedation was employed during this procedure. A
total of Versed 4 mg and Fentanyl 100 mcg was administered
intravenously.

Moderate Sedation Time: 64 minutes. The patient's level of
consciousness and vital signs were monitored continuously by
radiology nursing throughout the procedure under my direct
supervision.

CONTRAST:  40mL OMNIPAQUE IOHEXOL 300 MG/ML SOLN, 14mL OMNIPAQUE
IOHEXOL 300 MG/ML SOLN

FLUOROSCOPY TIME:  Fluoroscopy Time: 4 minutes 20 seconds (244 mGy).

COMPLICATIONS:
None immediate.



The right common femoral artery was interrogated with ultrasound and
found to be widely patent. An image was obtained and stored for the
medical record. Local anesthesia was attained by infiltration with
1% lidocaine. A small dermatotomy was made. Under real-time
sonographic guidance, the vessel was punctured with a 21 gauge
micropuncture needle. Using standard technique, the initial micro
needle was exchanged over a 0.018 micro wire for a transitional 4
French micro sheath. The micro sheath was then exchanged over a
0.035 wire for a 5 French vascular sheath.

A C2 cobra catheter was advanced into the abdominal aorta. The
catheter was used to select the celiac artery. A celiac arteriogram
was performed. The celiac axis gives rise to the left inferior
phrenic artery, splenic artery, left gastric artery and the common
hepatic artery.

The C2 cobra catheter was advanced into the common hepatic artery
over a glidewire. Additional arteriography was performed.
Conventional hepatic arterial anatomy. The proximal left and right
hepatic arteries are tortuous with 360 degree curvatures. The
gastroduodenal artery is identified as is the right gastric artery.
No convincing tumor blush at this location.

TIGER cantata microcatheter was subsequently advanced into the
segment 8 hepatic artery. Contrast injection was performed. This
appears to provide supply to the tumor primarily located within
segment 8. Initial contrast runs were obtained with varying
injection rates and duration in till a successful injection was
performed without reflux. The right was 0.6 for a total of 3 mL.
Cone beam CT imaging was then performed. The cone beam CT was then
reformatted on independent workstation and compared with the
recently obtained CT scan of the abdomen. The distribution is
consistent with that of the tumor.

Technetium labeled macro aggregated albumin was then injected
through the catheter into segment 8. The microcatheter was brought
back into the 5 French catheter and the catheters were removed as a
unit and disposed of appropriately.

Hemostasis was then attained using a Celt arterial closure device.
IMPRESSION: 1. Successful pre [AGE] planning study. The patient is a candidate for
segment 8 radiation segmentectomy.
2. Injection of technetium labeled macro aggregated albumin to
assess lung shunt function.

## 2021-07-01 MED ORDER — MIDAZOLAM HCL 2 MG/2ML IJ SOLN
INTRAMUSCULAR | Status: DC | PRN
  Administered 2021-07-01: 1 mg via INTRAVENOUS

## 2021-07-01 MED ORDER — LIDOCAINE HCL 1 % IJ SOLN
INTRAMUSCULAR | Status: AC
  Filled 2021-07-01: qty 20

## 2021-07-01 MED ORDER — IOHEXOL 300 MG/ML  SOLN
100.0000 mL | Freq: Once | INTRAMUSCULAR | Status: AC | PRN
  Administered 2021-07-01: 40 mL via INTRA_ARTERIAL

## 2021-07-01 MED ORDER — SODIUM CHLORIDE 0.9 % IV SOLN: INTRAVENOUS | Status: DC

## 2021-07-01 MED ORDER — MIDAZOLAM HCL 2 MG/2ML IJ SOLN
INTRAMUSCULAR | Status: AC
  Filled 2021-07-01: qty 6

## 2021-07-01 MED ORDER — FENTANYL CITRATE (PF) 100 MCG/2ML IJ SOLN
INTRAMUSCULAR | Status: DC | PRN
  Administered 2021-07-01 (×2): 50 ug via INTRAVENOUS

## 2021-07-01 MED ORDER — MIDAZOLAM HCL 2 MG/2ML IJ SOLN
INTRAMUSCULAR | Status: DC | PRN
  Administered 2021-07-01: .5 mg via INTRAVENOUS

## 2021-07-01 MED ORDER — TECHNETIUM TO 99M ALBUMIN AGGREGATED
4.2000 | Freq: Once | INTRAVENOUS | Status: AC
  Administered 2021-07-01: 4.2 via INTRAVENOUS

## 2021-07-01 MED ORDER — FENTANYL CITRATE (PF) 100 MCG/2ML IJ SOLN
INTRAMUSCULAR | Status: AC
  Filled 2021-07-01: qty 4

## 2021-07-01 MED ORDER — IOHEXOL 300 MG/ML  SOLN
100.0000 mL | Freq: Once | INTRAMUSCULAR | Status: AC | PRN
  Administered 2021-07-01: 14 mL via INTRA_ARTERIAL

## 2021-07-01 MED ORDER — LIDOCAINE HCL (PF) 1 % IJ SOLN
INTRAMUSCULAR | Status: DC | PRN
  Administered 2021-07-01: 10 mL via INTRADERMAL

## 2021-07-01 NOTE — Procedures (Signed)
Interventional Radiology Procedure Note  Procedure: Pre-Y90 planning angiogram  Complications: None  Estimated Blood Loss: None  Recommendations: - Bedrest x 1 hr - DC home   Signed,  Criselda Peaches, MD

## 2021-07-01 NOTE — Progress Notes (Signed)
Referring Physician(s): Feng,Y  Supervising Physician: Jacqulynn Cadet  Patient Status:  [facility] OP  Chief Complaint:  Multifocal hepatocellular carcinoma  Subjective: Patient familiar to IR service from right liver mass biopsy on 03/08/2021 and consultation with Dr. Laurence Ferrari on 06/01/2021.  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  He is currently under the care of Dr. Burr Medico and undergoing combination immunotherapy plus Avastin.  Following discussions with Dr. Laurence Ferrari he was deemed an appropriate candidate for hepatic Y 90 radioembolization and presents today for arterial roadmapping study / prophylactic embolization as well as test Y 90 dosing.  He currently denies fever, headache, chest pain, dyspnea, cough, worsening abdominal pain, back pain, nausea, vomiting or bleeding.  Additional history as below.  Past Medical History:  Diagnosis Date   Depression    Hepatocellular carcinoma ([place]) 03/08/2021   Parasite infestation    Past Surgical History:  Procedure Laterality Date   IR RADIOLOGIST EVAL & MGMT  06/01/2021   NO PAST SURGERIES        Allergies: Patient has no known allergies.  Medications: Prior to Admission medications   Medication Sig Start Date End Date Taking? Authorizing Provider  Ascorbic Acid (VITAMIN C) 100 MG tablet Take 100 mg by mouth daily.   Yes [provider]  HYDROcodone-acetaminophen (NORCO) 5-325 MG tablet Take 1 tablet by mouth every 6 (six) hours as needed for moderate pain. 05/19/21  Yes [name], MD  Multiple Vitamin (MULTIVITAMIN WITH MINERALS) TABS tablet Take 1 tablet by mouth daily.   Yes [provider]  ondansetron (ZOFRAN) 8 MG tablet Take 1 tablet (8 mg total) by mouth every 8 (eight) hours as needed for nausea or vomiting. 04/28/21  Yes Dede Query T, PA-C  polyethylene glycol (MIRALAX / GLYCOLAX) 17 g packet Take 17 g by mouth daily as needed for mild constipation. 03/08/21   Yes Georgette Shell, MD  prochlorperazine (COMPAZINE) 10 MG tablet Take 1 tablet (10 mg total) by mouth every 6 (six) hours as needed for nausea or vomiting. 04/28/21  Yes Lincoln Brigham, PA-C  senna (SENOKOT) 8.6 MG TABS tablet Take 1 tablet (8.6 mg total) by mouth 2 (two) times daily. 03/08/21  Yes Georgette Shell, MD  vitamin B-12 (CYANOCOBALAMIN) 100 MCG tablet Take 100 mcg by mouth daily.   Yes [provider]     Vital Signs: BP 137/80 (BP Location: Left Arm)   Pulse 77   Temp 98 F (36.7 C) (Oral)   Resp 18   SpO2 100%   Physical Exam awake, alert.  Chest clear to auscultation bilaterally.  Heart with regular rate and rhythm.  Abdomen soft, positive bowel sounds, nontender.  No lower extremity edema.  Imaging: No results found.  Labs:  CBC: Recent Labs    04/28/21 1158 05/19/21 0944 06/10/21 0916 06/30/21 1202  WBC 5.4 6.3 4.3 4.8  HGB 12.8* 13.6 13.4 14.0  HCT 36.9* 39.4 38.2* 40.5  PLT 105* 148* 108* 100*    COAGS: Recent Labs    03/05/21 1255 03/08/21 0446 07/01/21 0755  INR 1.1 0.9 1.0  APTT 29  --   --     BMP: Recent Labs    04/28/21 1158 05/19/21 0944 06/10/21 0916 06/30/21 1202  NA 136 135 136 139  K 3.9 3.8 4.0 4.0  CL 104 102 106 105  CO2 '26 23 23 24  '$ GLUCOSE 105* 80 91 95  BUN '14 15 15 12  '$ CALCIUM  9.3 9.2 9.5 9.8  CREATININE 0.75 0.72 0.76 0.77  GFRNONAA >60 >60 >60 >60    LIVER FUNCTION TESTS: Recent Labs    04/28/21 1158 05/19/21 0944 06/10/21 0916 06/30/21 1202  BILITOT 0.8 0.5 0.5 0.5  AST 126* 159* 24 58*  ALT 111* 200* 18 47*  ALKPHOS 71 62 64 58  PROT 7.9 7.9 8.0 8.4*  ALBUMIN 3.2* 3.1* 3.4* 3.8    Assessment and Plan: Patient familiar to IR service from right liver mass biopsy on 03/08/2021 and consultation with Dr. Laurence Ferrari on 06/01/2021.  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  He is currently under the care of Dr. Burr Medico and undergoing combination  immunotherapy plus Avastin.  Following discussions with Dr. Laurence Ferrari he was deemed an appropriate candidate for hepatic Y 90 radioembolization and presents today for arterial roadmapping study / prophylactic embolization as well as test Y 90 dosing.Risks and benefits of procedure were discussed with the patient including, but not limited to bleeding, infection, vascular injury or contrast induced renal failure.  This interventional procedure involves the use of X-rays and because of the nature of the planned procedure, it is possible that we will have prolonged use of X-ray fluoroscopy.  Potential radiation risks to you include (but are not limited to) the following: - A slightly elevated risk for cancer  several years later in life. This risk is typically less than 0.5% percent. This risk is low in comparison to the normal incidence of human cancer, which is 33% for women and 50% for men according to the [place]. - Radiation induced injury can include skin redness, resembling a rash, tissue breakdown / ulcers and hair loss (which can be temporary or permanent).   The likelihood of either of these occurring depends on the difficulty of the procedure and whether you are sensitive to radiation due to previous procedures, disease, or genetic conditions.   IF your procedure requires a prolonged use of radiation, you will be notified and given written instructions for further action.  It is your responsibility to monitor the irradiated area for the 2 weeks following the procedure and to notify your physician if you are concerned that you have suffered a radiation induced injury.    All of the patient's questions were answered, patient is agreeable to proceed.  Consent signed and in chart.  Labs pending    Electronically Signed: D. Rowe Robert, PA-C 07/01/2021, 8:44 AM   I spent a total of 25 minutes at the the patient's bedside AND on the patient's hospital floor or unit,  greater than 50% of which was counseling/coordinating care for hepatic/visceral arteriogram with embolization/test Y 90 dosing    Patient ID: Gregorey Fosse, male   DOB: 1945/12/31, 75 y.o.   MRN: [id number]

## 2021-07-01 NOTE — Discharge Instructions (Signed)
Please call Interventional Radiology clinic [phone] with any questions or concerns.  You may remove your dressing and shower tomorrow.    Hepatic Artery Radioembolization, Care After The following information offers guidance on how to care for yourself after your procedure. Your health care provider may also give you more specific instructions. If you have problems or questions, contact your health care provider. What can I expect after the procedure? After the procedure, it is possible to have: A slight fever for 7 to 10 days. This may be accompanied by pain, nausea, or vomiting, which is referred to as post-embolization syndrome. You may be given medicine to help relieve these symptoms. If your fever gets worse, tell your health care provider. Tiredness (fatigue). Loss of appetite. This should gradually improve after about 1 week. Abdominal pain on your right side. Soreness and tenderness in your groin area where the needle and catheter were placed (puncture site). Follow these instructions at home: Puncture site care Follow instructions from your health care provider about how to take care of the puncture site. Make sure you: Wash your hands with soap and water for at least 20 seconds before and after you change your bandage (dressing). If soap and water are not available, use hand sanitizer. Change your dressing as told by your health care provider. Check your puncture site every day for signs of infection. Check for: More redness, swelling, or pain. Fluid or blood. Warmth. Pus or a bad smell. Activity Rest as told by your health care provider. Return to your normal activities as told by your health care provider. Ask your health care provider what activities are safe for you. Avoid sitting for a long time without moving. Get up to take short walks every 1-2 hours. This is important to improve blood flow and breathing. Ask for help if you feel weak or unsteady. If you were given  a sedative during the procedure, it can affect you for several hours. Do not drive or operate machinery until your health care provider says that it is safe. Do not lift anything that is heavier than 10 lb (4.5 kg), or the limit that you are told, until your health care provider says that it is safe. Medicines Take over-the-counter and prescription medicines only as told by your health care provider. Ask your health care provider if the medicine prescribed to you: Requires you to avoid driving or using machinery. Can cause constipation. You may need to take these actions to prevent or treat constipation: Drink enough fluid to keep your urine pale yellow. Take over-the-counter or prescription medicines. Eat foods that are high in fiber, such as beans, whole grains, and fresh fruits and vegetables. Limit foods that are high in fat and processed sugars, such as fried or sweet foods. General instructions Eat frequent, small meals until your appetite returns. Follow instructions from your health care provider about eating or drinking restrictions. Do not take baths, swim, or use a hot tub until your health care provider approves. You may take showers. Wash your puncture site with mild soap and water, and pat the area dry. Wear compression stockings as told by your health care provider. These stockings help to prevent blood clots and reduce swelling in your legs. Keep all follow-up visits. This is important. You may need to have blood tests and imaging tests. Contact a health care provider if: You have any of these signs of infection: More redness, swelling, or pain around your puncture site. Fluid or blood coming from your  coming from your puncture site. Warmth coming from your puncture site. Pus or a bad smell coming from your puncture site. You have pain that: Gets worse. Does not get better with medicine. Feels like very bad heartburn. Is in the middle of your abdomen, above your belly button. You have  any signs of infection or liver failure, such as: Your skin or the white parts of your eyes turn yellow (jaundice). The color of your urine changes to dark brown. The color of your stool (feces) changes to light yellow. Your abdominal measurement (girth) increases in a short period of time. You gain more than 5 lb (2.3 kg) in a short period of time. Get help right away if: You have a fever that lasts more than 10 days or is higher than what your health care provider told you to expect. You develop any of the following in your legs: Pain. Swelling. Skin that is cold or pale or turns blue. You have chest pain. You have blood in your vomit, saliva, or stool. You have trouble breathing. These symptoms may represent a serious problem that is an emergency. Do not wait to see if the symptoms will go away. Get medical help right away. Call your local emergency services (911 in the [place] Do not drive yourself to the hospital. Summary After the procedure, it is possible to have a slight fever for up to 7-10 days, tiredness, loss of appetite, abdominal pain on the right side, and groin tenderness where the catheter was placed. Do not come in close contact with people for up to a week after your procedure, as told by your health care provider. Follow instructions from your health care provider about how to take care of the puncture site. Contact a health care provider if you have any signs of infection. Get help right away if you develop pain or swelling in your legs or if your legs feel cool or look pale. This information is not intended to replace advice given to you by your health care provider. Make sure you discuss any questions you have with your health care provider. Document Revised: 09/06/2020 Document Reviewed: 09/06/2020 Elsevier Patient Education  2022 [employer]      Moderate Conscious Sedation, Adult, Care After This sheet gives you information about how to care for yourself  after your procedure. Your health care provider may also give you more specific instructions. If you have problems or questions, contact your health care provider. What can I expect after the procedure? After the procedure, it is common to have: Sleepiness for several hours. Impaired judgment for several hours. Difficulty with balance. Vomiting if you eat too soon. Follow these instructions at home: For the time period you were told by your health care provider: Rest. Do not participate in activities where you could fall or become injured. Do not drive or use machinery. Do not drink alcohol. Do not take sleeping pills or medicines that cause drowsiness. Do not make important decisions or sign legal documents. Do not take care of children on your own.      Eating and drinking Follow the diet recommended by your health care provider. Drink enough fluid to keep your urine pale yellow. If you vomit: Drink water, juice, or soup when you can drink without vomiting. Make sure you have little or no nausea before eating solid foods.   General instructions Take over-the-counter and prescription medicines only as told by your health care provider. Have a responsible adult stay with you   you are told. It is important to have someone help care for you until you are awake and alert. Do not smoke. Keep all follow-up visits as told by your health care provider. This is important. Contact a health care provider if: You are still sleepy or having trouble with balance after 24 hours. You feel light-headed. You keep feeling nauseous or you keep vomiting. You develop a rash. You have a fever. You have redness or swelling around the IV site. Get help right away if: You have trouble breathing. You have new-onset confusion at home. Summary After the procedure, it is common to feel sleepy, have impaired judgment, or feel nauseous if you eat too soon. Rest after you get home. Know the things you  should not do after the procedure. Follow the diet recommended by your health care provider and drink enough fluid to keep your urine pale yellow. Get help right away if you have trouble breathing or new-onset confusion at home. This information is not intended to replace advice given to you by your health care provider. Make sure you discuss any questions you have with your health care provider. Document Revised: 01/31/2020 Document Reviewed: 08/29/2019 Elsevier Patient Education  2021 [employer] YOUR NEXT PROCEDURE  Radiation precautions For up to a week after your procedure, there will be a small amount of radioactivity near your liver. This is not especially dangerous to other people. However, as told by your health care provider, you should follow these precautions for 7 days: Do not come in close contact with people. Do not sleep in the same bed as someone else. Do not hold children or babies. Do not have contact with pregnant women.  Post Y-90 Radioembolization Discharge Instructions  You have been given a radioactive material during your procedure.  While it is safe for you to be discharged home from the hospital, you need to proceed directly home.    Do not use public transportation, including air travel, lasting more than 2 hours for 1 week.  Avoid crowded public places for 1 week.  Adult visitors should try to avoid close contact with you for 1 week.    Children and pregnant females should not visit or have close contact with you for 1 week.  Items that you touch are not radioactive.  Do not sleep in the same bed as your partner for 1 week, and a condom should be used for sexual activity during the first 24 hours.  Your blood may be radioactive and caution should be used if any bleeding occurs during the recovery period.  Body fluids may be radioactive for 24 hours.  Wash your hands after voiding.  Men should sit to urinate.  Dispose of any soiled materials  (flush down toilet or place in trash at home) during the first day.  Drink 6 to 8 glasses of fluids per day for 5 days to hydrate yourself.  If you need to see a doctor during the first week, you must let them know that you were treated with yttrium-90 microspheres, and will be slightly radioactive.  They can call Interventional Radiology [phone] with any questions.

## 2021-07-02 LAB — AFP TUMOR MARKER: AFP, Serum, Tumor Marker: 41.7 ng/mL — ABNORMAL HIGH (ref 0.0–8.4)

## 2021-07-02 LAB — CEA: CEA: 2.6 ng/mL (ref 0.0–4.7)

## 2021-07-05 ENCOUNTER — Encounter: Payer: Self-pay | Admitting: Hematology

## 2021-07-21 ENCOUNTER — Encounter: Payer: Self-pay | Admitting: Dietician

## 2021-07-21 ENCOUNTER — Encounter: Payer: Self-pay | Admitting: Hematology

## 2021-07-21 ENCOUNTER — Inpatient Hospital Stay: Payer: Medicare Other | Attending: Hematology

## 2021-07-21 ENCOUNTER — Inpatient Hospital Stay: Payer: Medicare Other | Admitting: Dietician

## 2021-07-21 ENCOUNTER — Inpatient Hospital Stay: Payer: Medicare Other

## 2021-07-21 ENCOUNTER — Other Ambulatory Visit (HOSPITAL_COMMUNITY): Payer: Self-pay | Admitting: Physician Assistant

## 2021-07-21 ENCOUNTER — Inpatient Hospital Stay (HOSPITAL_BASED_OUTPATIENT_CLINIC_OR_DEPARTMENT_OTHER): Payer: Medicare Other | Admitting: Hematology

## 2021-07-21 ENCOUNTER — Other Ambulatory Visit: Payer: Self-pay

## 2021-07-21 VITALS — BP 165/88 | HR 69 | Temp 98.2°F | Resp 16 | Ht 65.0 in | Wt 129.7 lb

## 2021-07-21 DIAGNOSIS — R5383 Other fatigue: Secondary | ICD-10-CM | POA: Diagnosis not present

## 2021-07-21 DIAGNOSIS — Z5111 Encounter for antineoplastic chemotherapy: Secondary | ICD-10-CM | POA: Diagnosis not present

## 2021-07-21 DIAGNOSIS — C220 Liver cell carcinoma: Secondary | ICD-10-CM

## 2021-07-21 DIAGNOSIS — Z5112 Encounter for antineoplastic immunotherapy: Secondary | ICD-10-CM | POA: Diagnosis present

## 2021-07-21 DIAGNOSIS — R634 Abnormal weight loss: Secondary | ICD-10-CM | POA: Diagnosis not present

## 2021-07-21 DIAGNOSIS — I700 Atherosclerosis of aorta: Secondary | ICD-10-CM | POA: Insufficient documentation

## 2021-07-21 DIAGNOSIS — Z23 Encounter for immunization: Secondary | ICD-10-CM | POA: Diagnosis not present

## 2021-07-21 DIAGNOSIS — K5730 Diverticulosis of large intestine without perforation or abscess without bleeding: Secondary | ICD-10-CM | POA: Insufficient documentation

## 2021-07-21 DIAGNOSIS — B1920 Unspecified viral hepatitis C without hepatic coma: Secondary | ICD-10-CM | POA: Diagnosis not present

## 2021-07-21 LAB — CMP (CANCER CENTER ONLY)
ALT: 157 U/L — ABNORMAL HIGH (ref 0–44)
AST: 153 U/L — ABNORMAL HIGH (ref 15–41)
Albumin: 3.9 g/dL (ref 3.5–5.0)
Alkaline Phosphatase: 62 U/L (ref 38–126)
Anion gap: 9 (ref 5–15)
BUN: 11 mg/dL (ref 8–23)
CO2: 24 mmol/L (ref 22–32)
Calcium: 9.7 mg/dL (ref 8.9–10.3)
Chloride: 106 mmol/L (ref 98–111)
Creatinine: 0.81 mg/dL (ref 0.61–1.24)
GFR, Estimated: >60 mL/min (ref >60)
Glucose, Bld: 93 mg/dL (ref 70–99)
Potassium: 4 mmol/L (ref 3.5–5.1)
Sodium: 139 mmol/L (ref 135–145)
Total Bilirubin: 0.5 mg/dL (ref 0.3–1.2)
Total Protein: 8.3 g/dL — ABNORMAL HIGH (ref 6.5–8.1)

## 2021-07-21 LAB — CBC WITH DIFFERENTIAL (CANCER CENTER ONLY)
Abs Immature Granulocytes: 0 10*3/uL (ref 0.00–0.07)
Basophils Absolute: 0 10*3/uL (ref 0.0–0.1)
Basophils Relative: 0 %
Eosinophils Absolute: 0.1 10*3/uL (ref 0.0–0.5)
Eosinophils Relative: 2 %
HCT: 40.1 % (ref 39.0–52.0)
Hemoglobin: 13.7 g/dL (ref 13.0–17.0)
Immature Granulocytes: 0 %
Lymphocytes Relative: 48 %
Lymphs Abs: 2.1 10*3/uL (ref 0.7–4.0)
MCH: 30.9 pg (ref 26.0–34.0)
MCHC: 34.2 g/dL (ref 30.0–36.0)
MCV: 90.3 fL (ref 80.0–100.0)
Monocytes Absolute: 0.6 10*3/uL (ref 0.1–1.0)
Monocytes Relative: 13 %
Neutro Abs: 1.7 10*3/uL (ref 1.7–7.7)
Neutrophils Relative %: 37 %
Platelet Count: 113 10*3/uL — ABNORMAL LOW (ref 150–400)
RBC: 4.44 MIL/uL (ref 4.22–5.81)
RDW: 12 % (ref 11.5–15.5)
WBC Count: 4.5 10*3/uL (ref 4.0–10.5)
nRBC: 0 % (ref 0.0–0.2)

## 2021-07-21 MED ORDER — SODIUM CHLORIDE 0.9 % IV SOLN
Freq: Once | INTRAVENOUS | Status: AC
Start: 2021-07-21 — End: 2021-07-21

## 2021-07-21 MED ORDER — SODIUM CHLORIDE 0.9% FLUSH
10.0000 mL | INTRAVENOUS | Status: DC | PRN
Start: 2021-07-21 — End: 2021-07-21

## 2021-07-21 MED ORDER — HYDROCODONE-ACETAMINOPHEN 5-325 MG PO TABS: 1.0000 | ORAL_TABLET | Freq: Three times a day (TID) | ORAL | 0 refills | Status: DC | PRN

## 2021-07-21 MED ORDER — HEPARIN SOD (PORK) LOCK FLUSH 100 UNIT/ML IV SOLN: 500.0000 [IU] | Freq: Once | INTRAVENOUS | Status: DC | PRN

## 2021-07-21 MED ORDER — SODIUM CHLORIDE 0.9 % IV SOLN
1200.0000 mg | Freq: Once | INTRAVENOUS | Status: AC
  Administered 2021-07-21: 1200 mg via INTRAVENOUS
  Filled 2021-07-21: qty 20

## 2021-07-21 NOTE — Progress Notes (Signed)
Per Dr. Burr Medico, "OK To Treat w/elevated LFTs"

## 2021-07-21 NOTE — Progress Notes (Signed)
Nutrition  Patient provided complimentary case of  [facility]

## 2021-07-21 NOTE — Patient Instructions (Signed)
[facility] MEDICAL [facility]COL[facility]  Discharge Instructions: °Thank you for choosing [facility] to provide your oncology and hematology care.  ° °If you have a lab appointment with the Cancer Center, please go directly to the Cancer Center and check in at the registration area. °  °Wear comfortable clothing and clothing appropriate for easy access to any Portacath or PICC line.  ° °We strive to give you quality time with your provider. You may need to reschedule your appointment if you arrive late (15 or more minutes).  Arriving late affects you and other patients whose appointments are after yours.  Also, if you miss three or more appointments without notifying the office, you may be dismissed from the clinic at the provider’s discretion.    °  °For prescription refill requests, have your pharmacy contact our office and allow 72 hours for refills to be completed.   ° °Today you received the following chemotherapy and/or immunotherapy agents: Atezolizumab.     °  °To help prevent nausea and vomiting after your treatment, we encourage you to take your nausea medication as directed. ° °BELOW ARE SYMPTOMS THAT SHOULD BE REPORTED IMMEDIATELY: °*FEVER GREATER THAN 100.4 F (38 °C) OR HIGHER °*CHILLS OR SWEATING °*NAUSEA AND VOMITING THAT IS NOT CONTROLLED WITH YOUR NAUSEA MEDICATION °*UNUSUAL SHORTNESS OF BREATH °*UNUSUAL BRUISING OR BLEEDING °*URINARY PROBLEMS (pain or burning when urinating, or frequent urination) °*BOWEL PROBLEMS (unusual diarrhea, constipation, pain near the anus) °TENDERNESS IN MOUTH AND THROAT WITH OR WITHOUT PRESENCE OF ULCERS (sore throat, sores in mouth, or a toothache) °UNUSUAL RASH, SWELLING OR PAIN  °UNUSUAL VAGINAL DISCHARGE OR ITCHING  ° °Items with * indicate a potential emergency and should be followed up as soon as possible or go to the Emergency Department if any problems should occur. ° °Please show the CHEMOTHERAPY ALERT CARD or IMMUNOTHERAPY ALERT CARD at  check-in to the Emergency Department and triage nurse. ° °Should you have questions after your visit or need to cancel or reschedule your appointment, please contact [facility]  Dept: [phone]  and follow the prompts.  Office hours are 8:00 a.m. to 4:30 p.m. Monday - Friday. Please note that voicemails left after 4:00 p.m. may not be returned until the following business day.  We are closed weekends and major holidays. You have access to a nurse at all times for urgent questions. Please call the main number to the clinic Dept: [phone] and follow the prompts. ° ° °For any non-urgent questions, you may also contact your provider using MyChart. We now offer e-Visits for anyone 18 and older to request care online for non-urgent symptoms. For details visit [email or web address]. °  °Also download the MyChart app! Go to the app store, search "MyChart", open the app, select [place], and log in with your MyChart username and password. ° °Due to Covid, a mask is required upon entering the hospital/clinic. If you do not have a mask, one will be given to you upon arrival. For doctor visits, patients may have 1 support person aged 18 or older with them. For treatment visits, patients cannot have anyone with them due to current Covid guidelines and our immunocompromised population.  ° °

## 2021-07-21 NOTE — H&P (Signed)
Chief Complaint: Patient was seen in consultation today for hepatocellular carcinoma  Referring Physician(s): Dr. Burr Medico  Supervising Physician: Jacqulynn Cadet  Patient Status: [facility] - Out-pt  History of Present Illness: Shawn Bailey is a 75 y.o. 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 He is familiar to IR from a liver lesion biopsy 03/06/21. He is currently undergoing combination immunotherapy and was referred to Interventional Radiology to discuss liver-directed therapy. He met with Dr. Laurence Ferrari 06/01/21 to discuss his options and he was in agreement to proceed with the recommended transarterial radioembolization with Y90. He has thus far completed the required pre-procedure work up including CT imaging and pre-Y90 planning angiogram 07/01/21 at [facility].   The patient presents today for a right lobar transarterial Y90 radioembolization. He denies any pain or discomfort and states he had an uneventful recovery after the pre-Y90 procedure.   Past Medical History:  Diagnosis Date   Depression    Hepatocellular carcinoma ([place]) 03/08/2021   Parasite infestation     Past Surgical History:  Procedure Laterality Date   IR ANGIOGRAM SELECTIVE EACH ADDITIONAL VESSEL  07/01/2021   IR ANGIOGRAM VISCERAL SELECTIVE  07/01/2021   IR EMBO ARTERIAL NOT HEMORR HEMANG INC GUIDE ROADMAPPING  07/01/2021   IR RADIOLOGIST EVAL & MGMT  06/01/2021   IR US GUIDE VASC ACCESS RIGHT  07/01/2021   NO PAST SURGERIES      Allergies: Patient has no known allergies.  Medications: Prior to Admission medications   Medication Sig Start Date End Date Taking? Authorizing Provider  Ascorbic Acid (VITAMIN C) 100 MG tablet Take 100 mg by mouth daily.    [provider]  HYDROcodone-acetaminophen (NORCO) 5-325 MG tablet Take 1 tablet by mouth every 8 (eight) hours as needed for moderate pain. 07/21/21   [name],  MD  Multiple Vitamin (MULTIVITAMIN WITH MINERALS) TABS tablet Take 1 tablet by mouth daily.    [provider]  ondansetron (ZOFRAN) 8 MG tablet Take 1 tablet (8 mg total) by mouth every 8 (eight) hours as needed for nausea or vomiting. 04/28/21   Dede Query T, PA-C  polyethylene glycol (MIRALAX / GLYCOLAX) 17 g packet Take 17 g by mouth daily as needed for mild constipation. 03/08/21   Georgette Shell, MD  prochlorperazine (COMPAZINE) 10 MG tablet Take 1 tablet (10 mg total) by mouth every 6 (six) hours as needed for nausea or vomiting. 04/28/21   Lincoln Brigham, PA-C  senna (SENOKOT) 8.6 MG TABS tablet Take 1 tablet (8.6 mg total) by mouth 2 (two) times daily. 03/08/21   Georgette Shell, MD  vitamin B-12 (CYANOCOBALAMIN) 100 MCG tablet Take 100 mcg by mouth daily.    [provider]     Family History  Problem Relation Age of Onset   Diabetes Mother     Social History   Socioeconomic History   Marital status: Divorced    Spouse name: Not on file   Number of children: 2   Years of education: Not on file   Highest education level: Not on file  Occupational History   Not on file  Tobacco Use   Smoking status: Never   Smokeless tobacco: Never  Vaping Use   Vaping Use: Never used  Substance and Sexual Activity   Alcohol use: Yes    Comment: occasional beer, he used to drink alcohol regularly 15 years    Drug use: Not Currently   Sexual activity:  Not on file  Other Topics Concern   Not on file  Social History Narrative   Not on file   Social Determinants of Health   Financial Resource Strain: Not on file  Food Insecurity: Not on file  Transportation Needs: Not on file  Physical Activity: Not on file  Stress: Not on file  Social Connections: Not on file    Review of Systems: A 12 point ROS discussed and pertinent positives are indicated in the HPI above.  All other systems are negative.  Review of Systems  Constitutional:  Negative for  fatigue.  Respiratory:  Negative for cough and shortness of breath.   Cardiovascular:  Negative for chest pain and leg swelling.  Gastrointestinal:  Negative for abdominal pain, diarrhea, nausea and vomiting.  Neurological:  Negative for dizziness and headaches.   Vital Signs: BP (!) 182/95   Pulse 78   Temp (!) 97.3 F (36.3 C) (Oral)   Resp 18   SpO2 100%   Physical Exam Constitutional:      General: He is not in acute distress.    Appearance: He is not ill-appearing.  HENT:     Mouth/Throat:     Mouth: Mucous membranes are moist.     Pharynx: Oropharynx is clear.  Cardiovascular:     Rate and Rhythm: Normal rate and regular rhythm.     Pulses: Normal pulses.     Heart sounds: Normal heart sounds.  Pulmonary:     Effort: Pulmonary effort is normal.     Breath sounds: Normal breath sounds.  Abdominal:     General: Bowel sounds are normal.     Palpations: Abdomen is soft.     Tenderness: There is no abdominal tenderness.  Musculoskeletal:        General: Normal range of motion.     Right lower leg: No edema.     Left lower leg: No edema.  Skin:    General: Skin is warm and dry.  Neurological:     Mental Status: He is alert and oriented to person, place, and time.    Imaging: NM LIVER TUMOR LOC IMFLAM SPECT 1 DAY  Result Date: 07/02/2021 CLINICAL DATA:  Hepatocellular carcinoma. Primarily chemotherapy. Pre yttrium 90 radioembolization mapping. EXAM: NUCLEAR MEDICINE LIVER SCAN TECHNIQUE: Abdominal images were obtained in multiple projections after intrahepatic arterial injection of radiopharmaceutical. SPECT imaging was performed. Lung shunt calculation was performed. RADIOPHARMACEUTICALS:  4.24mllicurie MAA TECHNETIUM TO 1M ALBUMIN AGGREGATED COMPARISON:  CT 06/16/2021 FINDINGS: The injected microaggregated albumin localizes within RIGHT hepatic lobe. No evidence of activity within the stomach, duodenum, or bowel. Calculated shunt fraction to the lungs equals 10.0%.  IMPRESSION: 1. No significant extrahepatic radiotracer activity following intrahepatic arterial injection of MAA. 2. Lung shunt fraction equals 10.0% Electronically Signed   By: SSuzy BouchardM.D.   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 He was initially treated with immunotherapy (Tecentriq plus Avastin) and has had a very good initial response. His primary lesion has significantly decreased in size and he is now a candidate for radiation segmentectomy. He presents for pre Y 90 planning. EXAM: IR EMBO ARTERIAL NOT HEMORR HEMANG INC GUIDE ROADMAPPING; SELECTIVE VISCERAL ARTERIOGRAPHY; ADDITIONAL ARTERIOGRAPHY; IR ULTRASOUND GUIDANCE VASC ACCESS RIGHT MEDICATIONS: None. ANESTHESIA/SEDATION: Moderate (conscious) sedation was employed during this procedure. A total of Versed 4 mg and Fentanyl 100 mcg was administered intravenously. Moderate Sedation Time: 64  minutes. The patient's level of consciousness and vital signs were monitored continuously by radiology nursing throughout the procedure under my direct supervision. CONTRAST:  25m OMNIPAQUE IOHEXOL 300 MG/ML SOLN, 134mOMNIPAQUE IOHEXOL 300 MG/ML SOLN FLUOROSCOPY TIME:  Fluoroscopy Time: 4 minutes 20 seconds (244 mGy). COMPLICATIONS: None immediate. PROCEDURE: Informed consent was obtained from the patient following explanation of the procedure, risks, benefits and alternatives. The patient understands, agrees and consents for the procedure. All questions were addressed. A time out was performed prior to the initiation of the procedure. Maximal barrier sterile technique utilized including caps, mask, sterile gowns, sterile gloves, large sterile drape, hand hygiene, and Betadine prep. The right common femoral artery was interrogated with ultrasound and found to be widely patent. An image was obtained and stored for the  medical record. Local anesthesia was attained by infiltration with 1% lidocaine. A small dermatotomy was made. Under real-time sonographic guidance, the vessel was punctured with a 21 gauge micropuncture needle. Using standard technique, the initial micro needle was exchanged over a 0.018 micro wire for a transitional 4 Fr[place] sheath. The micro sheath was then exchanged over a 0.035 wire for a 5 French vascular sheath. A C2 cobra catheter was advanced into the abdominal aorta. The catheter was used to select the celiac artery. A celiac arteriogram was performed. The celiac axis gives rise to the left inferior phrenic artery, splenic artery, left gastric artery and the common hepatic artery. The C2 cobra catheter was advanced into the common hepatic artery over a glidewire. Additional arteriography was performed. Conventional hepatic arterial anatomy. The proximal left and right hepatic arteries are tortuous with 360 degree curvatures. The gastroduodenal artery is identified as is the right gastric artery. No convincing tumor blush at this location. A Cook cantata microcatheter was subsequently advanced into the segment 8 hepatic artery. Contrast injection was performed. This appears to provide supply to the tumor primarily located within segment 8. Initial contrast runs were obtained with varying injection rates and duration in till a successful injection was performed without reflux. The right was 0.6 for a total of 3 mL. Cone beam CT imaging was then performed. The cone beam CT was then reformatted on independent workstation and compared with the recently obtained CT scan of the abdomen. The distribution is consistent with that of the tumor. Technetium labeled macro aggregated albumin was then injected through the catheter into segment 8. The microcatheter was brought back into the 5 French catheter and the catheters were removed as a unit and disposed of appropriately. Hemostasis was then attained using a  Celt arterial closure device. IMPRESSION: 1. Successful pre Y90 planning study. The patient is a candidate for segment 8 radiation segmentectomy. 2. Injection of technetium labeled macro aggregated albumin to assess lung shunt function. Signed, HeCriselda PeachesMD, RPMetzgerascular and Interventional Radiology Specialists [facility] Electronically Signed   By: HeJacqulynn Cadet.D.   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 He was initially treated with immunotherapy (Tecentriq plus Avastin) and has had a very good initial response. His primary lesion has significantly decreased in size and he is now a candidate for radiation segmentectomy. He presents for pre Y 90 planning. EXAM: IR EMBO ARTERIAL NOT HEMORR HEMANG INC GUIDE ROADMAPPING; SELECTIVE VISCERAL ARTERIOGRAPHY; ADDITIONAL ARTERIOGRAPHY; IR ULTRASOUND GUIDANCE VASC ACCESS RIGHT MEDICATIONS: None. ANESTHESIA/SEDATION: Moderate (conscious) sedation was employed during this procedure. A total  of Versed 4 mg and Fentanyl 100 mcg was administered intravenously. Moderate Sedation Time: 64 minutes. The patient's level of consciousness and vital signs were monitored continuously by radiology nursing throughout the procedure under my direct supervision. CONTRAST:  19m OMNIPAQUE IOHEXOL 300 MG/ML SOLN, 117mOMNIPAQUE IOHEXOL 300 MG/ML SOLN FLUOROSCOPY TIME:  Fluoroscopy Time: 4 minutes 20 seconds (244 mGy). COMPLICATIONS: None immediate. PROCEDURE: Informed consent was obtained from the patient following explanation of the procedure, risks, benefits and alternatives. The patient understands, agrees and consents for the procedure. All questions were addressed. A time out was performed prior to the initiation of the procedure. Maximal barrier sterile technique utilized including caps, mask, sterile  gowns, sterile gloves, large sterile drape, hand hygiene, and Betadine prep. The right common femoral artery was interrogated with ultrasound and found to be widely patent. An image was obtained and stored for the medical record. Local anesthesia was attained by infiltration with 1% lidocaine. A small dermatotomy was made. Under real-time sonographic guidance, the vessel was punctured with a 21 gauge micropuncture needle. Using standard technique, the initial micro needle was exchanged over a 0.018 micro wire for a transitional 4 Fr[place]icro sheath. The micro sheath was then exchanged over a 0.035 wire for a 5 French vascular sheath. A C2 cobra catheter was advanced into the abdominal aorta. The catheter was used to select the celiac artery. A celiac arteriogram was performed. The celiac axis gives rise to the left inferior phrenic artery, splenic artery, left gastric artery and the common hepatic artery. The C2 cobra catheter was advanced into the common hepatic artery over a glidewire. Additional arteriography was performed. Conventional hepatic arterial anatomy. The proximal left and right hepatic arteries are tortuous with 360 degree curvatures. The gastroduodenal artery is identified as is the right gastric artery. No convincing tumor blush at this location. A Cook cantata microcatheter was subsequently advanced into the segment 8 hepatic artery. Contrast injection was performed. This appears to provide supply to the tumor primarily located within segment 8. Initial contrast runs were obtained with varying injection rates and duration in till a successful injection was performed without reflux. The right was 0.6 for a total of 3 mL. Cone beam CT imaging was then performed. The cone beam CT was then reformatted on independent workstation and compared with the recently obtained CT scan of the abdomen. The distribution is consistent with that of the tumor. Technetium labeled macro aggregated albumin was then  injected through the catheter into segment 8. The microcatheter was brought back into the 5 French catheter and the catheters were removed as a unit and disposed of appropriately. Hemostasis was then attained using a Celt arterial closure device. IMPRESSION: 1. Successful pre Y90 planning study. The patient is a candidate for segment 8 radiation segmentectomy. 2. Injection of technetium labeled macro aggregated albumin to assess lung shunt function. Signed, HeCriselda PeachesMD, RPHamptonascular and Interventional Radiology Specialists GrCenter [facility]adiology Electronically Signed   By: HeJacqulynn Cadet.D.   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 He was initially treated with immunotherapy (Tecentriq plus Avastin) and has had a very good initial response. His primary lesion has significantly decreased in size and he is now a candidate for radiation segmentectomy. He presents for pre Y 90 planning. EXAM: IR EMBO ARTERIAL NOT HEMORR HEMANG INC GUIDE ROADMAPPING; SELECTIVE VISCERAL ARTERIOGRAPHY; ADDITIONAL ARTERIOGRAPHY; IR ULTRASOUND GUIDANCE VASC  ACCESS RIGHT MEDICATIONS: None. ANESTHESIA/SEDATION: Moderate (conscious) sedation was employed during this procedure. A total of Versed 4 mg and Fentanyl 100 mcg was administered intravenously. Moderate Sedation Time: 64 minutes. The patient's level of consciousness and vital signs were monitored continuously by radiology nursing throughout the procedure under my direct supervision. CONTRAST:  103m OMNIPAQUE IOHEXOL 300 MG/ML SOLN, 178mOMNIPAQUE IOHEXOL 300 MG/ML SOLN FLUOROSCOPY TIME:  Fluoroscopy Time: 4 minutes 20 seconds (244 mGy). COMPLICATIONS: None immediate. PROCEDURE: Informed consent was obtained from the patient following explanation of the procedure, risks, benefits and alternatives. The patient understands, agrees and  consents for the procedure. All questions were addressed. A time out was performed prior to the initiation of the procedure. Maximal barrier sterile technique utilized including caps, mask, sterile gowns, sterile gloves, large sterile drape, hand hygiene, and Betadine prep. The right common femoral artery was interrogated with ultrasound and found to be widely patent. An image was obtained and stored for the medical record. Local anesthesia was attained by infiltration with 1% lidocaine. A small dermatotomy was made. Under real-time sonographic guidance, the vessel was punctured with a 21 gauge micropuncture needle. Using standard technique, the initial micro needle was exchanged over a 0.018 micro wire for a transitional 4 Fr[place]icro sheath. The micro sheath was then exchanged over a 0.035 wire for a 5 French vascular sheath. A C2 cobra catheter was advanced into the abdominal aorta. The catheter was used to select the celiac artery. A celiac arteriogram was performed. The celiac axis gives rise to the left inferior phrenic artery, splenic artery, left gastric artery and the common hepatic artery. The C2 cobra catheter was advanced into the common hepatic artery over a glidewire. Additional arteriography was performed. Conventional hepatic arterial anatomy. The proximal left and right hepatic arteries are tortuous with 360 degree curvatures. The gastroduodenal artery is identified as is the right gastric artery. No convincing tumor blush at this location. A Cook cantata microcatheter was subsequently advanced into the segment 8 hepatic artery. Contrast injection was performed. This appears to provide supply to the tumor primarily located within segment 8. Initial contrast runs were obtained with varying injection rates and duration in till a successful injection was performed without reflux. The right was 0.6 for a total of 3 mL. Cone beam CT imaging was then performed. The cone beam CT was then reformatted on  independent workstation and compared with the recently obtained CT scan of the abdomen. The distribution is consistent with that of the tumor. Technetium labeled macro aggregated albumin was then injected through the catheter into segment 8. The microcatheter was brought back into the 5 French catheter and the catheters were removed as a unit and disposed of appropriately. Hemostasis was then attained using a Celt arterial closure device. IMPRESSION: 1. Successful pre Y90 planning study. The patient is a candidate for segment 8 radiation segmentectomy. 2. Injection of technetium labeled macro aggregated albumin to assess lung shunt function. Signed, HeCriselda PeachesMD, RPMedicine Bowascular and Interventional Radiology Specialists GrPro[facility]adiology Electronically Signed   By: HeJacqulynn Cadet.D.   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 He was initially treated with immunotherapy (Tecentriq plus Avastin) and has had a very good initial response. His primary lesion has significantly decreased in size and he is now a candidate for radiation segmentectomy. He presents for pre Y 90 planning. EXAM:  IR EMBO ARTERIAL NOT HEMORR HEMANG INC GUIDE ROADMAPPING; SELECTIVE VISCERAL ARTERIOGRAPHY; ADDITIONAL ARTERIOGRAPHY; IR ULTRASOUND GUIDANCE VASC ACCESS RIGHT MEDICATIONS: None. ANESTHESIA/SEDATION: Moderate (conscious) sedation was employed during this procedure. A total of Versed 4 mg and Fentanyl 100 mcg was administered intravenously. Moderate Sedation Time: 64 minutes. The patient's level of consciousness and vital signs were monitored continuously by radiology nursing throughout the procedure under my direct supervision. CONTRAST:  60m OMNIPAQUE IOHEXOL 300 MG/ML SOLN, 133mOMNIPAQUE IOHEXOL 300 MG/ML SOLN FLUOROSCOPY TIME:  Fluoroscopy Time: 4  minutes 20 seconds (244 mGy). COMPLICATIONS: None immediate. PROCEDURE: Informed consent was obtained from the patient following explanation of the procedure, risks, benefits and alternatives. The patient understands, agrees and consents for the procedure. All questions were addressed. A time out was performed prior to the initiation of the procedure. Maximal barrier sterile technique utilized including caps, mask, sterile gowns, sterile gloves, large sterile drape, hand hygiene, and Betadine prep. The right common femoral artery was interrogated with ultrasound and found to be widely patent. An image was obtained and stored for the medical record. Local anesthesia was attained by infiltration with 1% lidocaine. A small dermatotomy was made. Under real-time sonographic guidance, the vessel was punctured with a 21 gauge micropuncture needle. Using standard technique, the initial micro needle was exchanged over a 0.018 micro wire for a transitional 4 Fr[place]icro sheath. The micro sheath was then exchanged over a 0.035 wire for a 5 French vascular sheath. A C2 cobra catheter was advanced into the abdominal aorta. The catheter was used to select the celiac artery. A celiac arteriogram was performed. The celiac axis gives rise to the left inferior phrenic artery, splenic artery, left gastric artery and the common hepatic artery. The C2 cobra catheter was advanced into the common hepatic artery over a glidewire. Additional arteriography was performed. Conventional hepatic arterial anatomy. The proximal left and right hepatic arteries are tortuous with 360 degree curvatures. The gastroduodenal artery is identified as is the right gastric artery. No convincing tumor blush at this location. A Cook cantata microcatheter was subsequently advanced into the segment 8 hepatic artery. Contrast injection was performed. This appears to provide supply to the tumor primarily located within segment 8. Initial contrast runs were  obtained with varying injection rates and duration in till a successful injection was performed without reflux. The right was 0.6 for a total of 3 mL. Cone beam CT imaging was then performed. The cone beam CT was then reformatted on independent workstation and compared with the recently obtained CT scan of the abdomen. The distribution is consistent with that of the tumor. Technetium labeled macro aggregated albumin was then injected through the catheter into segment 8. The microcatheter was brought back into the 5 French catheter and the catheters were removed as a unit and disposed of appropriately. Hemostasis was then attained using a Celt arterial closure device. IMPRESSION: 1. Successful pre Y90 planning study. The patient is a candidate for segment 8 radiation segmentectomy. 2. Injection of technetium labeled macro aggregated albumin to assess lung shunt function. Signed, HeCriselda PeachesMD, RPHillsdaleascular and Interventional Radiology Specialists GrBe[facility]bornadiology Electronically Signed   By: HeJacqulynn Cadet.D.   On: 07/01/2021 12:43   NM FUSION  Result Date: 07/02/2021 CLINICAL DATA:  Pre Yttrium 90 evaluation. Hepatocellular carcinoma. Immediate chemotherapy positive response. EXAM: ULTRASOUND MISCELLANEOUS SOFT TISSUE TECHNIQUE: Abdominal images were obtained in multiple projections after intrahepatic arterial injection of radiopharmaceutical. SPECT imaging was performed. Lung shunt calculation was performed. RADIOPHARMACEUTICALS:  4.12 technetium MAA COMPARISON:  CT 06/16/2021, MRI 03/06/2021 FINDINGS: The injected microaggregated albumin localizes within the RIGHT hepatic lobe. No evidence of activity within the stomach, duodenum, or bowel. Calculated shunt fraction to the lungs equals 10.0%. IMPRESSION: 1. No significant extrahepatic radiotracer activity following intrahepatic arterial injection of MAA. 2. Lung shunt fraction equals 10.0% Electronically Signed   By: Suzy Bouchard M.D.    On: 07/02/2021 12:54    Labs:  CBC: Recent Labs    05/19/21 0944 06/10/21 0916 06/30/21 1202 07/21/21 1130  WBC 6.3 4.3 4.8 4.5  HGB 13.6 13.4 14.0 13.7  HCT 39.4 38.2* 40.5 40.1  PLT 148* 108* 100* 113*    COAGS: Recent Labs    03/05/21 1255 03/08/21 0446 07/01/21 0755  INR 1.1 0.9 1.0  APTT 29  --   --     BMP: Recent Labs    06/10/21 0916 06/30/21 1202 07/01/21 0755 07/21/21 1130  NA 136 139 139 139  K 4.0 4.0 4.9 4.0  CL 106 105 104 106  CO2 23 24 25 24   GLUCOSE 91 95 98 93  BUN 15 12 14 11   CALCIUM 9.5 9.8 9.9 9.7  CREATININE 0.76 0.77 0.73 0.81  GFRNONAA >60 >60 >60 >60    LIVER FUNCTION TESTS: Recent Labs    06/10/21 0916 06/30/21 1202 07/01/21 0755 07/21/21 1130  BILITOT 0.5 0.5 1.1 0.5  AST 24 58* 78* 153*  ALT 18 47* 55* 157*  ALKPHOS 64 58 52 62  PROT 8.0 8.4* 8.8* 8.3*  ALBUMIN 3.4* 3.8 4.1 3.9    TUMOR MARKERS: No results for input(s): AFPTM, CEA, CA199, CHROMGRNA in the last 8760 hours.  Assessment and Plan:  Hepatocellular carcinoma: Shawn Bailey, 75 year old male, presents today to the [place] [facility] department for an image-guided transarterial right lobar radioembolization with Yttrium-90.  Risks and benefits discussed with the patient including, but not limited to bleeding, infection, vascular injury, post procedural pain, nausea, vomiting and fatigue, contrast induced renal failure, liver failure, radiation injury to the bowel, radiation induced cholecystitis, neutropenia and possible need for additional procedures.  All of the patient's questions were answered, patient is agreeable to proceed. He has been NPO. Labs are pending but will be reviewed prior to the start of the procedure.   Consent signed and in chart.   Thank you for this interesting consult.  I greatly enjoyed meeting Shawn Bailey and look forward to participating in their care.  A copy of this report was sent to the  requesting provider on this date.  Electronically Signed: Soyla Dryer, AGACNP-BC [phone] 07/22/2021, 8:39 AM   I spent a total of  30 Minutes   in face to face in clinical consultation, greater than 50% of which was counseling/coordinating care for Y90 radioembolization

## 2021-07-21 NOTE — Progress Notes (Signed)
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 He underwent Y90 mapping on 9/15 and scheduled for treatment 10/6.  Received call from check in that patient requesting case of Ensure. Met with patient during infusion. He reports doing well, he denies nutrition impact symptoms. Patient reports he continues to struggle with gaining weight despite having a good appetite. He eats 3 meals and snacks frequently, drinks 1-2 Ensure daily. Patient reports he feels good, is walking most everyday and enjoys being outside. Yesterday he drank a glass of whole milk and piece of toast with his vitamins for breakfast, Ensure for snack, Big Mac with french fries for lunch, 1/2 ribeye steak, potatoes, and salad for dinner. He snacks on "lots of things" like eggs and peanut butter.    Medications: norco, compazine, zofran, miralax, senokot, B12, vitamin C, MVI  Labs: reviewed  Anthropometrics: Weight 129 lb 11.2 oz today decreased from 132 lb 11.2 oz on 9/14 increased from 128 lb 11.2 oz on 8/25   8/3 - 126 lb 8 oz 7/13 - 130 lb 8 oz   NUTRITION DIAGNOSIS: Food and nutrition related knowledge deficit ongoing   INTERVENTION:  Continue eating small frequent meals and snacks with adequate calories and protein Continue drinking Ensure Plus/equivalent, recommend 2/day Complimentary Ensure Enlive case provided today Encouraged having bedtime snack for added calories Discussed ways to add calories and protein to foods Continue activity as able per MD Contact information provided     MONITORING, EVALUATION, GOAL: weight trends, intake   NEXT VISIT: To be scheduled as needed with treatment

## 2021-07-21 NOTE — Progress Notes (Signed)
[facility]   [phone] [phone]   Clinic Follow up Note   Patient Care Team: Pa, [facility] as PCP - General (Family Medicine) [name], MD as Consulting Physician (Hematology and Oncology) [name], RN (Inactive) as Oncology Nurse Navigator  Date of Service:  07/21/2021  CHIEF COMPLAINT: f/u of liver cancer  CURRENT THERAPY:  -Atezolizumab and bevacizumab q 3 weeks -Y90 on 07/01/21 and scheduled for 07/22/21, will hold beva before and after  ASSESSMENT & PLAN:  Shawn Bailey is a 75 y.o. male with   1.  Multifocal hepatocellular carcinoma, cT3N0M, stage IIIA -Liver MRI 03/06/21 showed a large 9.5 cm and a 1.3 cm liver mass is seen in right lobe, biopsy 03/08/21 confirmed moderately differentiated hepatocellular carcinoma.  No nodal or distant metastasis on CT 03/05/21. -This is likely related to his HCV infection -He is not a candidate for liver resection or transplant. Dr. [name] at [facility] did not recommend surgery given the location and extent of disease. -He started atezolizumab and bevacizumab on 04/28/21, while waiting for liver radioembolization. He tolerates this well with mild fatigue. He overall feels better after started treatment  -His AFP has dropped significantly which likely indicating good response  -he underwent Y90 mapping on 07/01/21 and is scheduled for treatment tomorrow, 07/22/21. He has not yet been scheduled for a second procedure. -labs reviewed, his transaminitis is slightly worse, overall adequate for treatment. He will proceed with atezolizumab alone again today, as we are holding beva during his Y90 -Continue Tecentriq every 3 weeks   2. Symptom management: Weight loss, decreased appetite, fatigue, pain -Secondary to liver cancer -he has diminished appetite and taste changes, causing weight loss. He is supplementing with two Ensure a day. -He will continue follow with our nutritionist as  needed -he reports using Norco up to two times a day, but he notes he ran out about 2 weeks ago.  3. Untreated HCV  -During 02/2021 Hospitalization, labs showed HIV non reactive and hepatitis panel hep C ab positive, he was not aware that he has HCV infection -due to his recent [facility] diagnosis, will hold off HCV treatment for now -he does not see any doctors regularly. I previously referred him to the liver clinic for evaluation of his cancer, HCV, and for endoscopy when needed. This has not yet been scheduled.     PLAN: -I refilled his Norco today -proceed with atezolizumab alone today (Beva being held for Y90) -Y90 tomorrow, 07/22/21 -labs, f/u, and atezolizumab in 3 weeks   No problem-specific Assessment & Plan notes found for this encounter.   SUMMARY OF ONCOLOGIC HISTORY: Oncology History Overview Note  Cancer Staging Hepatocellular carcinoma [facility]) Staging form: Liver, AJCC 8th Edition - Clinical stage from 03/08/2021: Stage IIIA (cT3, cN0, cM0) - Signed by [name], MD on 03/11/2021 Stage prefix: Initial diagnosis Histologic grade (G): G2 Histologic grading system: 4 grade system     Hepatocellular carcinoma ([place])   Initial Diagnosis   Liver mass, right lobe   03/05/2021 Imaging   CT CAP  IMPRESSION: 1. Right hepatic lobe masses which are suspicious for either metastatic disease or primary malignancy such as hepatocellular carcinoma. These are suboptimally evaluated on this noncontrast exam. Consider multidisciplinary oncology consultation with potential clinical strategies of tissue sampling versus PET to direct sampling. 2. No primary malignancy identified within the chest, abdomen, or pelvis, given limitation of noncontrast exam. 3. Left nephrolithiasis. 4. Coronary artery atherosclerosis. Aortic Atherosclerosis (ICD10-I70.0). 5. Esophageal  air fluid level suggests dysmotility or gastroesophageal reflux.   03/06/2021 Imaging   MRI Liver  IMPRESSION: 9.5 cm  heterogeneous hypovascular mass in anterior right hepatic lobe, and 1.3 cm hypervascular mass in the posterior right hepatic lobe. Differential diagnosis includes liver metastases and multifocal hepatocellular carcinoma. Suggest correlation with serum AFP level, and possible tissue sampling.   No evidence of abdominal metastatic disease.   03/08/2021 Initial Biopsy   A. LIVER, RIGHT, BIOPSY:  - Hepatocellular carcinoma.   COMMENT:  The hepatocellular carcinoma is moderately differentiated and associated with areas of necrosis.  Immunohistochemistry shows positivity with glypican-3 and weak staining with HepPar 1.  The tumor is negative with arginase 1, alpha-fetoprotein, CDX2, cytokeratin 7, cytokeratin 20, cytokeratin AE1/AE3, PSA, prostein and MOC31.  Polyclonal CEA shows focal canalicular pattern.  The morphology and immunophenotype are consistent with hepatocellular carcinoma.    03/08/2021 Cancer Staging   Staging form: Liver, AJCC 8th Edition - Clinical stage from 03/08/2021: Stage IIIA (cT3, cN0, cM0) - Signed by [name], MD on 03/11/2021 Stage prefix: Initial diagnosis Histologic grade (G): G2 Histologic grading system: 4 grade system   04/28/2021 -  Chemotherapy   Patient is on Treatment Plan : LUNG Atezolizumab + Bevacizumab q21d Maintenance     06/16/2021 Imaging   CT Abdomen  IMPRESSION: Marked interval decrease in size of the large RIGHT hepatic lobe lesion, biopsy-proven hepatocellular carcinoma. No arterial phase enhancement on early phase, vague enhancement on later phase greater than 20 Hounsfield unit enhancement on venous phase. Less pronounced arterial enhancement than on previous imaging with small peripheral foci of enhancement on arterial phase particularly along the margin of the tumor near the dome of the liver (image 12/2) 11 mm focus of enhancement in this area.   Interval response to therapy of a small lesion previously compatible with LI-RADS category 5  lesion in the posterior RIGHT hepatic lobe.   No new hepatic lesion.   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   Signs of nephrolithiasis calculus on the LEFT measuring approximately 7 mm.   Colonic diverticulosis without evidence of acute diverticulitis.   Aortic Atherosclerosis (ICD10-I70.0).      INTERVAL HISTORY:  Shawn Bailey is here for a follow up of liver cancer. He was last seen by me on 06/30/21. He presents to the clinic accompanied by his ex-wife and her husband. She acts as a Optometrist to help him understand some of the medical jargon. He reports he is doing well overall. He has lost some weight, and he notes supplementing with Ensure twice a day. He reports he is having continued pain, and his norco does not completely resolve his pain. I explained that  as long as the Norco is making his pain tolerable, it's doing its job.   All other systems were reviewed with the patient and are negative.  MEDICAL HISTORY:  Past Medical History:  Diagnosis Date   Depression    Hepatocellular carcinoma ([place]) 03/08/2021   Parasite infestation     SURGICAL HISTORY: Past Surgical History:  Procedure Laterality Date   IR ANGIOGRAM SELECTIVE EACH ADDITIONAL VESSEL  07/01/2021   IR ANGIOGRAM VISCERAL SELECTIVE  07/01/2021   IR EMBO ARTERIAL NOT HEMORR HEMANG INC GUIDE ROADMAPPING  07/01/2021   IR RADIOLOGIST EVAL & MGMT  06/01/2021   IR US GUIDE VASC ACCESS RIGHT  07/01/2021   NO PAST SURGERIES      I have reviewed the social history and family history with the patient and  they are unchanged from previous note.  ALLERGIES:  has No Known Allergies.  MEDICATIONS:  Current Outpatient Medications  Medication Sig Dispense Refill   Ascorbic Acid (VITAMIN C) 100 MG tablet Take 100 mg by mouth daily.     HYDROcodone-acetaminophen (NORCO) 5-325 MG tablet Take 1 tablet by mouth every 8 (eight) hours as needed for moderate pain. 30 tablet 0   Multiple Vitamin  (MULTIVITAMIN WITH MINERALS) TABS tablet Take 1 tablet by mouth daily.     ondansetron (ZOFRAN) 8 MG tablet Take 1 tablet (8 mg total) by mouth every 8 (eight) hours as needed for nausea or vomiting. 30 tablet 0   polyethylene glycol (MIRALAX / GLYCOLAX) 17 g packet Take 17 g by mouth daily as needed for mild constipation. 14 each 0   prochlorperazine (COMPAZINE) 10 MG tablet Take 1 tablet (10 mg total) by mouth every 6 (six) hours as needed for nausea or vomiting. 30 tablet 0   senna (SENOKOT) 8.6 MG TABS tablet Take 1 tablet (8.6 mg total) by mouth 2 (two) times daily. 120 tablet 0   vitamin B-12 (CYANOCOBALAMIN) 100 MCG tablet Take 100 mcg by mouth daily.     No current facility-administered medications for this visit.   Facility-Administered Medications Ordered in Other Visits  Medication Dose Route Frequency Provider Last Rate Last Admin   0.9 %  sodium chloride infusion   Intravenous Once [name], MD       0.9 %  sodium chloride infusion   Intravenous Once [name], MD       atezolizumab [facility]) 1,200 mg in sodium chloride 0.9 % 250 mL chemo infusion  1,200 mg Intravenous Once [name], MD       heparin lock flush 100 unit/mL  500 Units Intracatheter Once PRN [name], MD       sodium chloride flush (NS) 0.9 % injection 10 mL  10 mL Intracatheter PRN [name], MD        PHYSICAL EXAMINATION: ECOG PERFORMANCE STATUS: 1 - Symptomatic but completely ambulatory  Vitals:   07/21/21 1209  BP: (!) 165/88  Pulse: 69  Resp: 16  Temp: 98.2 F (36.8 C)  SpO2: 99%   Wt Readings from Last 3 Encounters:  07/21/21 129 lb 11.2 oz (58.8 kg)  06/30/21 132 lb 11.2 oz (60.2 kg)  06/10/21 128 lb 11.2 oz (58.4 kg)     GENERAL:alert, no distress and comfortable SKIN: skin color normal, no rashes or significant lesions EYES: normal, Conjunctiva are pink and non-injected, sclera clear  NEURO: alert & oriented x 3 with fluent speech  LABORATORY DATA:  I have reviewed the data as  listed CBC Latest Ref Rng & Units 07/21/2021 06/30/2021 06/10/2021  WBC 4.0 - 10.5 K/uL 4.5 4.8 4.3  Hemoglobin 13.0 - 17.0 g/dL 13.7 14.0 13.4  Hematocrit 39.0 - 52.0 % 40.1 40.5 38.2(L)  Platelets 150 - 400 K/uL 113(L) 100(L) 108(L)     CMP Latest Ref Rng & Units 07/21/2021 07/01/2021 06/30/2021  Glucose 70 - 99 mg/dL 93 98 95  BUN 8 - 23 mg/dL 11 14 12   Creatinine 0.61 - 1.24 mg/dL 0.81 0.73 0.77  Sodium 135 - 145 mmol/L 139 139 139  Potassium 3.5 - 5.1 mmol/L 4.0 4.9 4.0  Chloride 98 - 111 mmol/L 106 104 105  CO2 22 - 32 mmol/L 24 25 24   Calcium 8.9 - 10.3 mg/dL 9.7 9.9 9.8  Total Protein 6.5 - 8.1 g/dL 8.3(H) 8.8(H) 8.4(H)  Total Bilirubin  0.3 - 1.2 mg/dL 0.5 1.1 0.5  Alkaline Phos 38 - 126 U/L 62 52 58  AST 15 - 41 U/L 153(H) 78(H) 58(H)  ALT 0 - 44 U/L 157(H) 55(H) 47(H)      RADIOGRAPHIC STUDIES: I have personally reviewed the radiological images as listed and agreed with the findings in the report. No results found.    No orders of the defined types were placed in this encounter.  All questions were answered. The patient knows to call the clinic with any problems, questions or concerns. No barriers to learning was detected. The total time spent in the appointment was 30 minutes.     [name], MD 07/21/2021   I, Wilburn Mylar, am acting as scribe for [name], MD.   I have reviewed the above documentation for accuracy and completeness, and I agree with the above.

## 2021-07-22 ENCOUNTER — Other Ambulatory Visit (HOSPITAL_COMMUNITY): Payer: Self-pay | Admitting: Interventional Radiology

## 2021-07-22 ENCOUNTER — Encounter (HOSPITAL_COMMUNITY): Payer: Self-pay

## 2021-07-22 ENCOUNTER — Other Ambulatory Visit: Payer: Self-pay

## 2021-07-22 ENCOUNTER — Ambulatory Visit (HOSPITAL_COMMUNITY)
Admission: RE | Admit: 2021-07-22 | Discharge: 2021-07-22 | Disposition: A | Discharge status: Home / Self Care | Payer: Medicare Other | Source: Ambulatory Visit | Attending: Interventional Radiology | Admitting: Interventional Radiology

## 2021-07-22 ENCOUNTER — Encounter (HOSPITAL_COMMUNITY)
Admission: RE | Admit: 2021-07-22 | Discharge: 2021-07-22 | Disposition: A | Discharge status: Home / Self Care | Payer: Medicare Other | Source: Ambulatory Visit | Attending: Interventional Radiology | Admitting: Interventional Radiology

## 2021-07-22 DIAGNOSIS — Z79899 Other long term (current) drug therapy: Secondary | ICD-10-CM | POA: Insufficient documentation

## 2021-07-22 DIAGNOSIS — C220 Liver cell carcinoma: Secondary | ICD-10-CM

## 2021-07-22 DIAGNOSIS — F329 Major depressive disorder, single episode, unspecified: Secondary | ICD-10-CM | POA: Diagnosis not present

## 2021-07-22 DIAGNOSIS — K7460 Unspecified cirrhosis of liver: Secondary | ICD-10-CM | POA: Diagnosis not present

## 2021-07-22 HISTORY — PX: IR ANGIOGRAM VISCERAL SELECTIVE: IMG657

## 2021-07-22 HISTORY — PX: IR US GUIDE VASC ACCESS RIGHT: IMG2390

## 2021-07-22 HISTORY — PX: IR ANGIOGRAM SELECTIVE EACH ADDITIONAL VESSEL: IMG667

## 2021-07-22 HISTORY — PX: IR EMBO TUMOR ORGAN ISCHEMIA INFARCT INC GUIDE ROADMAPPING: IMG5449

## 2021-07-22 LAB — CBC WITH DIFFERENTIAL/PLATELET
Abs Immature Granulocytes: 0.01 10*3/uL (ref 0.00–0.07)
Basophils Absolute: 0 10*3/uL (ref 0.0–0.1)
Basophils Relative: 1 %
Eosinophils Absolute: 0.1 10*3/uL (ref 0.0–0.5)
Eosinophils Relative: 2 %
HCT: 41 % (ref 39.0–52.0)
Hemoglobin: 14.1 g/dL (ref 13.0–17.0)
Immature Granulocytes: 0 %
Lymphocytes Relative: 41 %
Lymphs Abs: 1.5 10*3/uL (ref 0.7–4.0)
MCH: 31.3 pg (ref 26.0–34.0)
MCHC: 34.4 g/dL (ref 30.0–36.0)
MCV: 91.1 fL (ref 80.0–100.0)
Monocytes Absolute: 0.5 10*3/uL (ref 0.1–1.0)
Monocytes Relative: 13 %
Neutro Abs: 1.6 10*3/uL — ABNORMAL LOW (ref 1.7–7.7)
Neutrophils Relative %: 43 %
Platelets: 105 10*3/uL — ABNORMAL LOW (ref 150–400)
RBC: 4.5 MIL/uL (ref 4.22–5.81)
RDW: 12.2 % (ref 11.5–15.5)
WBC: 3.6 10*3/uL — ABNORMAL LOW (ref 4.0–10.5)
nRBC: 0 % (ref 0.0–0.2)

## 2021-07-22 LAB — PROTIME-INR
INR: 1.1 (ref 0.8–1.2)
Prothrombin Time: 14 seconds (ref 11.4–15.2)

## 2021-07-22 LAB — COMPREHENSIVE METABOLIC PANEL
ALT: 150 U/L — ABNORMAL HIGH (ref 0–44)
AST: 157 U/L — ABNORMAL HIGH (ref 15–41)
Albumin: 4 g/dL (ref 3.5–5.0)
Alkaline Phosphatase: 52 U/L (ref 38–126)
Anion gap: 5 (ref 5–15)
BUN: 12 mg/dL (ref 8–23)
CO2: 26 mmol/L (ref 22–32)
Calcium: 9.4 mg/dL (ref 8.9–10.3)
Chloride: 104 mmol/L (ref 98–111)
Creatinine, Ser: 0.65 mg/dL (ref 0.61–1.24)
GFR, Estimated: >60 mL/min (ref >60)
Glucose, Bld: 92 mg/dL (ref 70–99)
Potassium: 3.6 mmol/L (ref 3.5–5.1)
Sodium: 135 mmol/L (ref 135–145)
Total Bilirubin: 1.1 mg/dL (ref 0.3–1.2)
Total Protein: 8.3 g/dL — ABNORMAL HIGH (ref 6.5–8.1)

## 2021-07-22 LAB — AFP TUMOR MARKER: AFP, Serum, Tumor Marker: 50.6 ng/mL — ABNORMAL HIGH (ref 0.0–8.4)

## 2021-07-22 IMAGING — NM NM LIVER IMG SPECT
4 series · 24 of 24 positions shown · non-contrast
Comparison: CT scan [DATE], MAA scan [DATE]

CLINICAL DATA: Radiation segmentectomy for hepatocellular
carcinoma. Patient status post conventional chemotherapy. Treatment
to segment 8 of the liver.

NUCLEAR MEDICINE SPECIAL MED RAD PHYSICS CONS; NUCLEAR MEDICINE
RADIO PHARM THERAPY INTRA ARTERIAL; NUCLEAR MEDICINE TREATMENT
PROCEDURE; NUCLEAR MEDICINE LIVER SCAN
TECHNIQUE: In conjunction with the interventional radiologist a Y- Microsphere
dose was calculated utilizing partition formulation and target
dosimetry with post process treatment volume calculations.
Calculated dose equal 29.9 mCi. Pre therapy MAA liver SPECT scan and
CTA were evaluated. Utilizing a microcatheter system, the hepatic
artery was selected and Y-90 microspheres were delivered in
fractionated aliquots. Radiopharmaceutical was delivered by the
interventional radiologist and nuclear radiologist.
The patient tolerated procedure well. No adverse effects were noted.
Bremsstrahlung planar and SPECT imaging of the abdomen following
intrahepatic arterial delivery of Y-90 microsphere was performed.
RADIOPHARMACEUTICALS:  28.9 millicuries yttrium 90 microspheres.

[Series 1: spect - (id) _(id)_tra · 4.1mm · 4.14mm/px · 6 of 128 frames shown]
[frame 11/128]
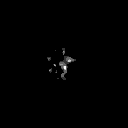
[frame 32/128]
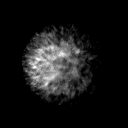
[frame 54/128]
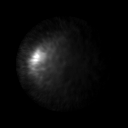
[frame 75/128]
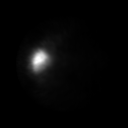
[frame 96/128]
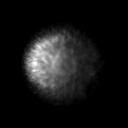
[frame 118/128]
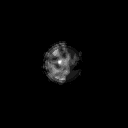

[Series 1: spect - (id) _(id)_cor · 4.1mm · 4.14mm/px · 6 of 128 frames shown]
[frame 11/128]
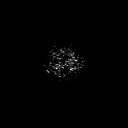
[frame 32/128]
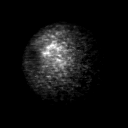
[frame 54/128]
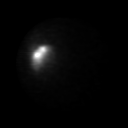
[frame 75/128]
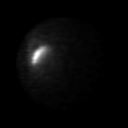
[frame 96/128]
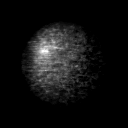
[frame 118/128]
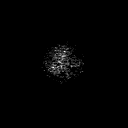

[Series 2: y-90 spect · 4.14mm/px · 6 of 63 frames shown]
[frame 6/63]
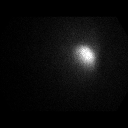
[frame 16/63]
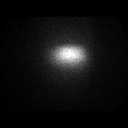
[frame 27/63]
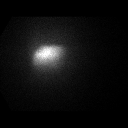
[frame 37/63]
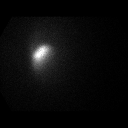
[frame 48/63]
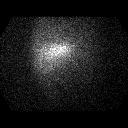
[frame 58/63]
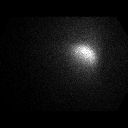

[Series 2: wbr_bone y-90 spect · 4.1mm · 4.14mm/px · 6 of 128 frames shown]
[frame 11/128]
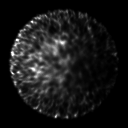
[frame 32/128]
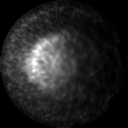
[frame 54/128]
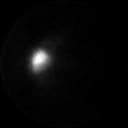
[frame 75/128]
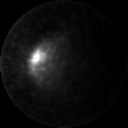
[frame 96/128]
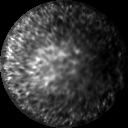
[frame 118/128]
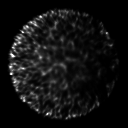

[24 of 24 positions shown; findings below may reference images not displayed]

FINDINGS: [AGE] microspheres therapy as above. First therapy the right
hepatic lobe (segment 8.

Bremsstrahlung planar and SPECT imaging of the abdomen following
intrahepatic arterial delivery of [ZT] demonstrates
radioactivity localized to the segment 8 RIGHT hepatic lobe. No
evidence of extrahepatic activity.
IMPRESSION: Successful [AGE] microsphere delivery for treatment of unresectable
liver metastasis. First therapy to the RIGHT lobe. Segment 8
radiation segmentectomy.

Bremssstrahlung scan demonstrates activity localized to segment 8
RIGHT hepatic lobe with no extrahepatic activity identified.

## 2021-07-22 IMAGING — XA IR EMBO TUMOR ORGAN ISCHEMIA INFARCT INC GUIDE ROADMAPPING
1 series · 13 of 24 positions shown · non-contrast
Comparison: none

INDICATION: 75-year-old male with HCV cirrhosis complicated by right-sided
hepatocellular carcinoma. He presents today for hepatic segment 8
radiation segmentectomy with [AGE].

[Series 1: processed: ir embo tumor organ ischemia  · 6 acquisitions, 13 frames shown]
[im 1/6]
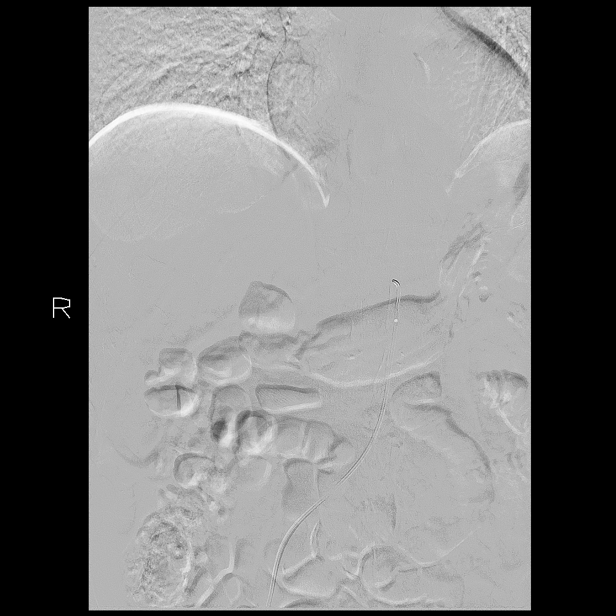
[im 1/6]
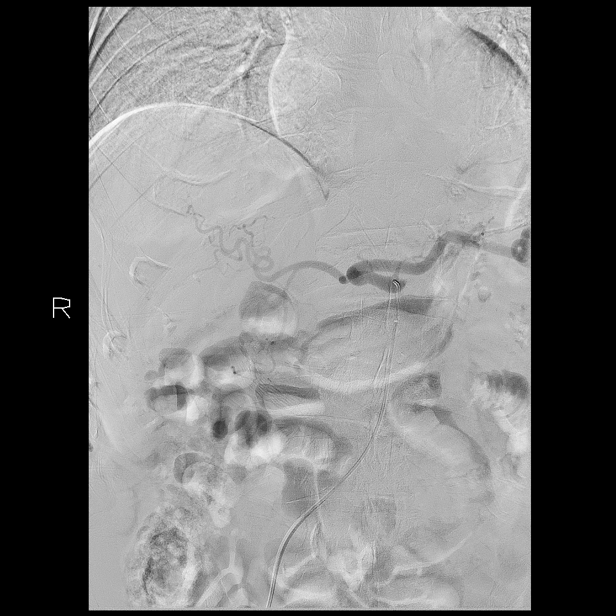
[im 2/6]
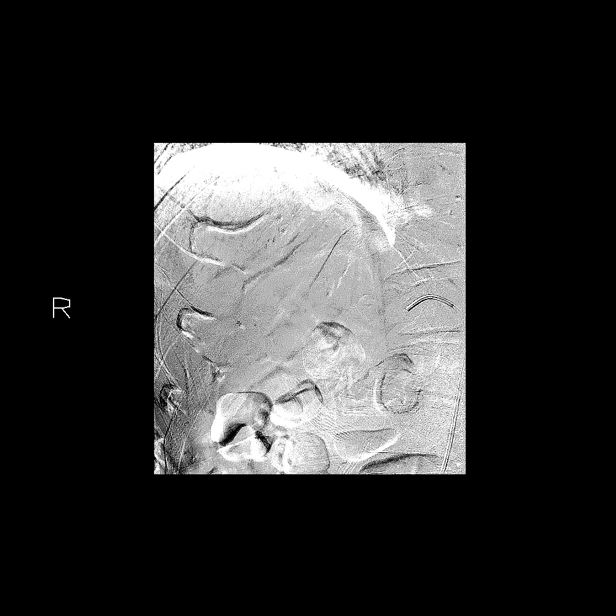
[im 2/6]
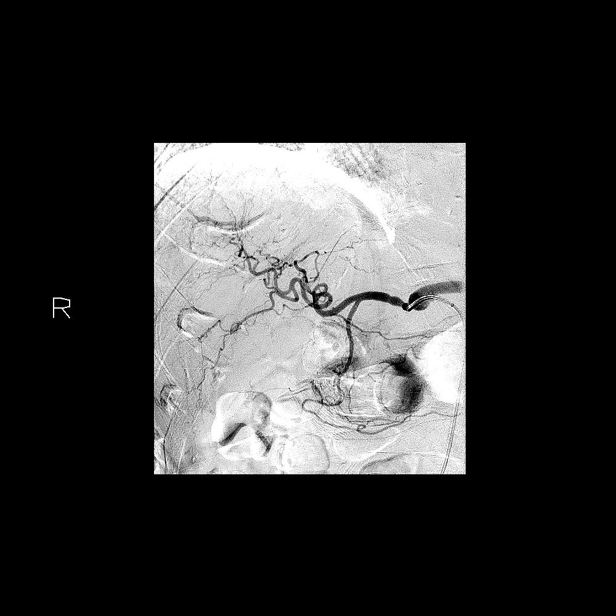
[im 3/6]
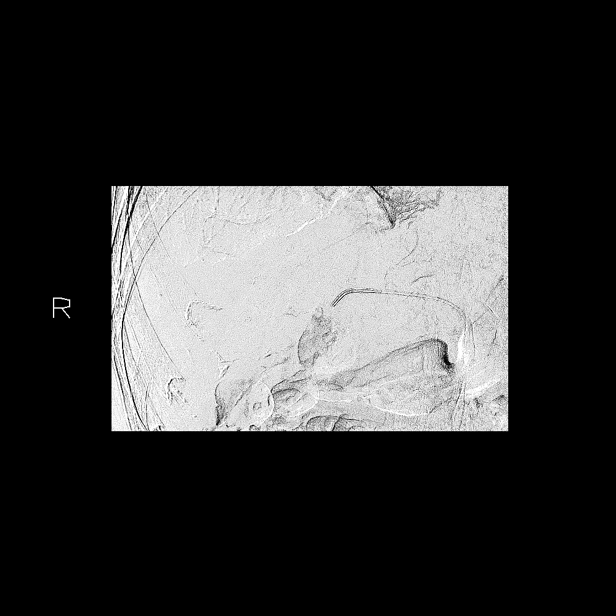
[im 3/6]
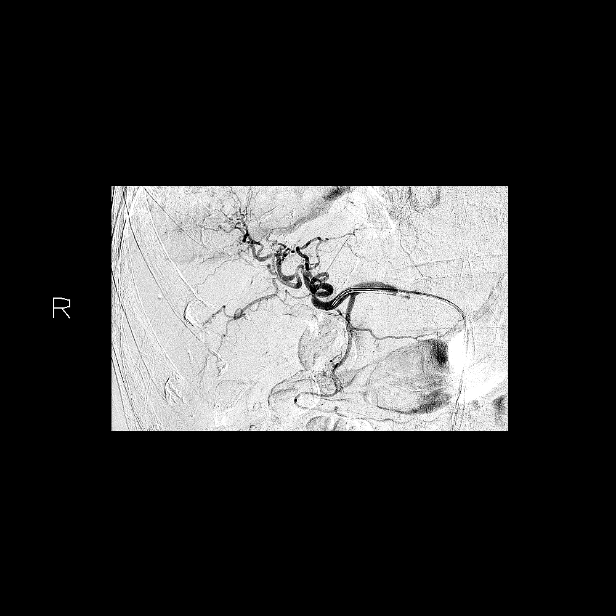
[im 4/6]
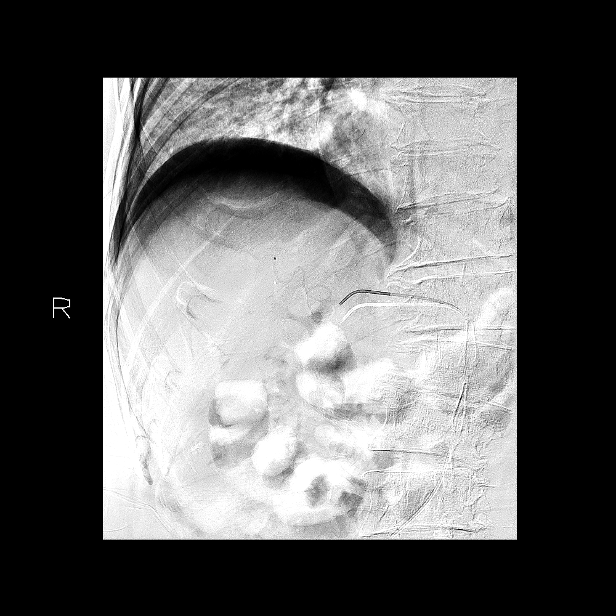
[im 4/6]
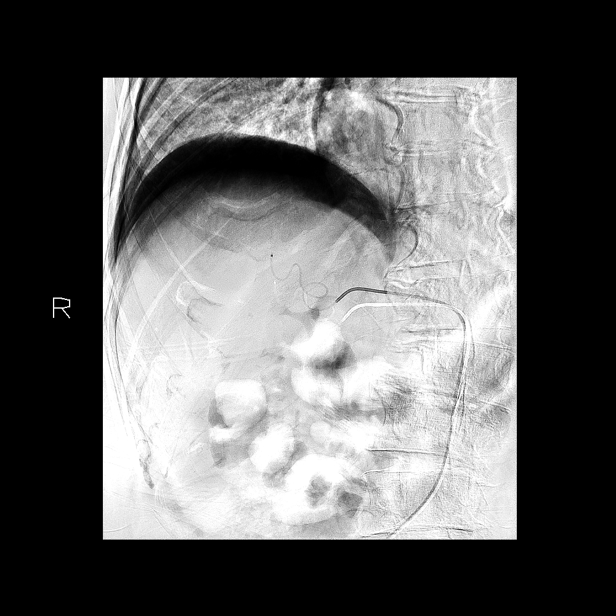
[im 4/6]
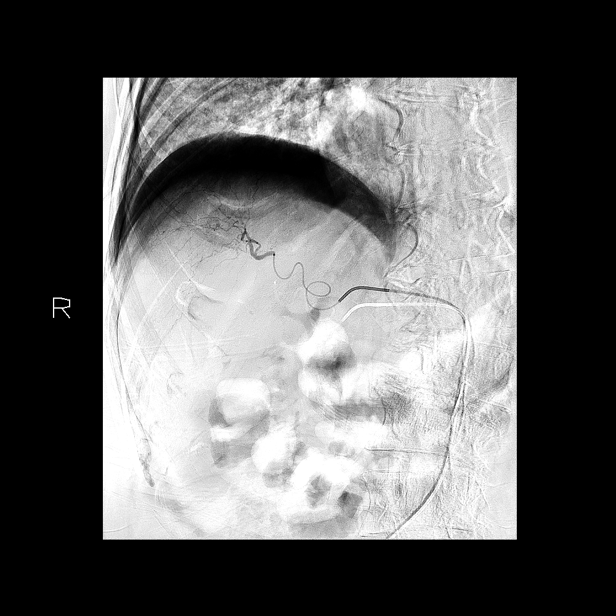
[im 5/6]
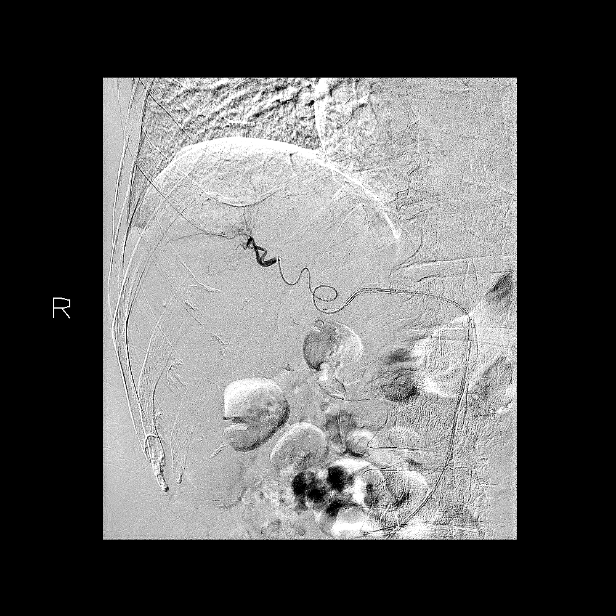
[im 5/6]
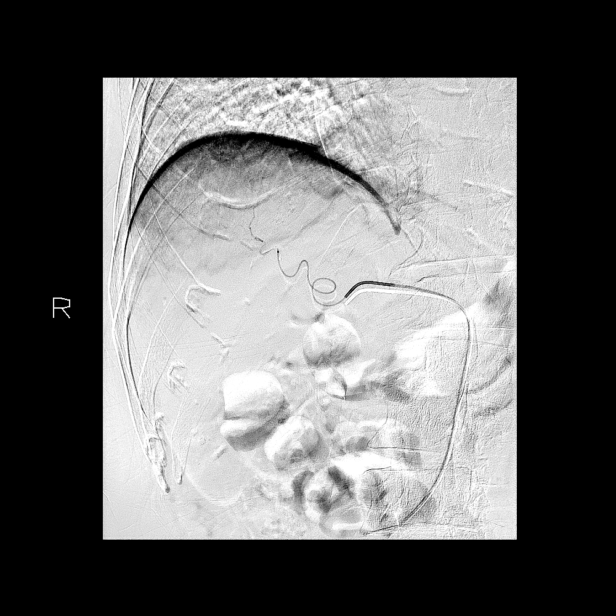
[im 6/6]
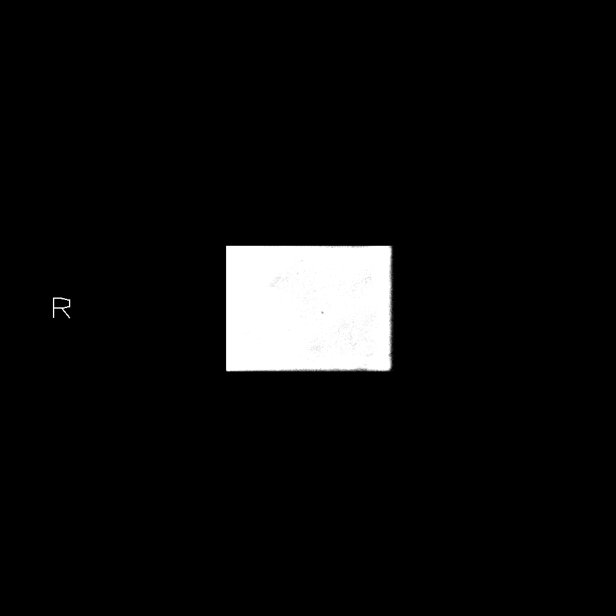
[im 6/6]
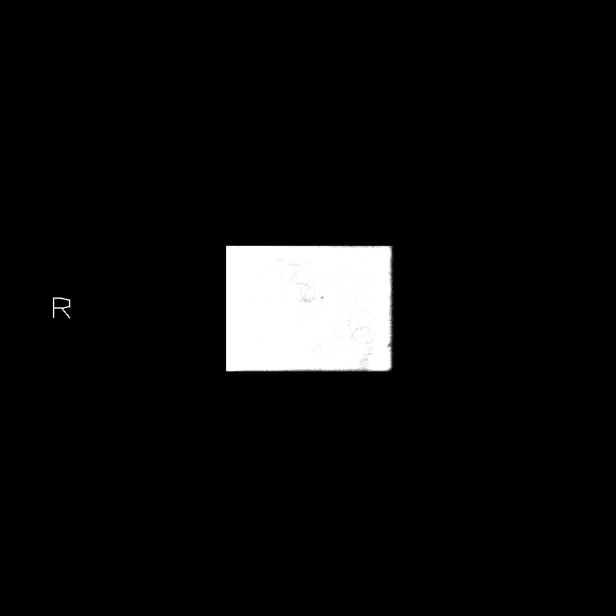

[13 of 24 positions shown; findings below may reference images not displayed]

EXAM:
IR EMBO TUMOR ORGAN ISCHEMIA INFARCT INC GUIDE ROADMAPPING;
ADDITIONAL ARTERIOGRAPHY; SELECTIVE VISCERAL ARTERIOGRAPHY; IR
ULTRASOUND GUIDANCE VASC ACCESS RIGHT

MEDICATIONS:
None.

ANESTHESIA/SEDATION:
Moderate (conscious) sedation was employed during this procedure. A
total of Versed 2 mg and Fentanyl 100 mcg was administered
intravenously.

Moderate Sedation Time: 63 minutes. The patient's level of
consciousness and vital signs were monitored continuously by
radiology nursing throughout the procedure under my direct
supervision.

CONTRAST:  25mL OMNIPAQUE IOHEXOL 300 MG/ML SOLN, 25mL OMNIPAQUE
IOHEXOL 300 MG/ML SOLN

FLUOROSCOPY TIME:  Fluoroscopy Time: 7 minutes 18 seconds (128 mGy).

COMPLICATIONS:
None immediate.



The right common femoral artery was interrogated with ultrasound and
found to be widely patent. An image was obtained and stored for the
permanent electronic medical record. Local anesthesia was attained
by infiltration with 1% lidocaine. A small dermatotomy was made.
Under real-time sonographic guidance, the vessel was punctured with
a 21 gauge micropuncture needle. Using standard technique, the
initial micro needle was exchanged over a 0.018 micro wire for a
transitional 4 French micro sheath. The micro sheath was then
exchanged over a 0.035 wire for a 5 French vascular sheath.

A 0.035 wire was advanced in the abdominal aorta. A C2 cobra
catheter was advanced over the wire and used to select the celiac
axis. A celiac arteriogram was performed. No significant interval
change in vascular anatomy. No evidence of complication from the
prior angiogram. The vessels appear slightly more robust likely
reflecting a longer period of time since Avastin therapy had been
received.

The C2 cobra catheter was then advanced into the proper hepatic
artery over a glidewire. Additional arteriography was performed
confirming the intra arterial hepatic anatomy and identifying the
origin of the segment 8 artery.

A renegade SENOJ microcatheter was then advanced over a Fathom 16
wire into the segment 8 hepatic artery. Arteriography was performed
utilizing cone beam CT acquisition. Evaluation on the independent
workstation confirmed that we were in the same position as at the
time of pre treatment planning and that this is the arterial supply
to segment 8 containing the tumor.

Trans arterial radioembolization segmentectomy was then performed by
instilling [AGE] labeled resin microspheres in aliquots. Positive
antegrade flow was assessed intermittently in the segment 8 artery.
There is no evidence of stasis during the course of the procedure.
Following successful administration of the entire dose, the
microcatheter was brought back into the 5 French catheter and the
catheter system removed as a unit and appropriately disposed of.

Hemostasis was attained with the assistance of a Celt extra arterial
closure device.
IMPRESSION: Successful segment 8 radio segmentectomy with [AGE].

## 2021-07-22 IMAGING — NM NM RADIO PHARM THERAPY INTRA ARTERIAL
4 series · 24 of 24 positions shown · non-contrast
Comparison: CT scan [DATE], MAA scan [DATE]

CLINICAL DATA: Radiation segmentectomy for hepatocellular
carcinoma. Patient status post conventional chemotherapy. Treatment
to segment 8 of the liver.

NUCLEAR MEDICINE SPECIAL MED RAD PHYSICS CONS; NUCLEAR MEDICINE
RADIO PHARM THERAPY INTRA ARTERIAL; NUCLEAR MEDICINE TREATMENT
PROCEDURE; NUCLEAR MEDICINE LIVER SCAN
TECHNIQUE: In conjunction with the interventional radiologist a Y- Microsphere
dose was calculated utilizing partition formulation and target
dosimetry with post process treatment volume calculations.
Calculated dose equal 29.9 mCi. Pre therapy MAA liver SPECT scan and
CTA were evaluated. Utilizing a microcatheter system, the hepatic
artery was selected and Y-90 microspheres were delivered in
fractionated aliquots. Radiopharmaceutical was delivered by the
interventional radiologist and nuclear radiologist.
The patient tolerated procedure well. No adverse effects were noted.
Bremsstrahlung planar and SPECT imaging of the abdomen following
intrahepatic arterial delivery of Y-90 microsphere was performed.
RADIOPHARMACEUTICALS:  28.9 millicuries yttrium 90 microspheres.

[Series 1: spect - (id) _(id)_cor · 4.1mm · 4.14mm/px · 6 of 128 frames shown]
[frame 11/128]
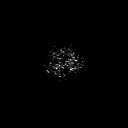
[frame 32/128]
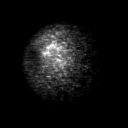
[frame 54/128]
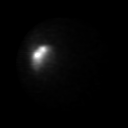
[frame 75/128]
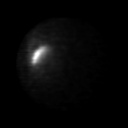
[frame 96/128]
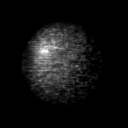
[frame 118/128]
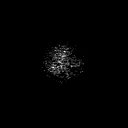

[Series 1: spect - (id) _(id)_tra · 4.1mm · 4.14mm/px · 6 of 128 frames shown]
[frame 11/128]
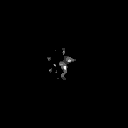
[frame 32/128]
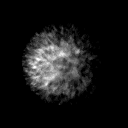
[frame 54/128]
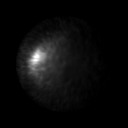
[frame 75/128]
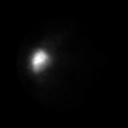
[frame 96/128]
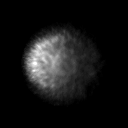
[frame 118/128]
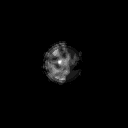

[Series 2: y-90 spect · 4.14mm/px · 6 of 63 frames shown]
[frame 6/63]
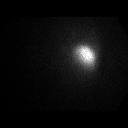
[frame 16/63]
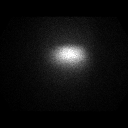
[frame 27/63]
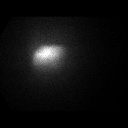
[frame 37/63]
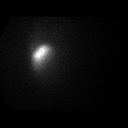
[frame 48/63]
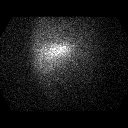
[frame 58/63]
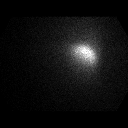

[Series 2: wbr_bone y-90 spect · 4.1mm · 4.14mm/px · 6 of 128 frames shown]
[frame 11/128]
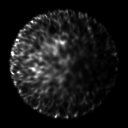
[frame 32/128]
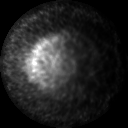
[frame 54/128]
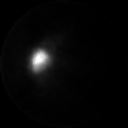
[frame 75/128]
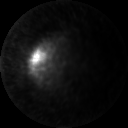
[frame 96/128]
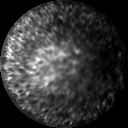
[frame 118/128]
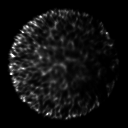

[24 of 24 positions shown; findings below may reference images not displayed]

FINDINGS: [AGE] microspheres therapy as above. First therapy the right
hepatic lobe (segment 8.

Bremsstrahlung planar and SPECT imaging of the abdomen following
intrahepatic arterial delivery of [ZT] demonstrates
radioactivity localized to the segment 8 RIGHT hepatic lobe. No
evidence of extrahepatic activity.
IMPRESSION: Successful [AGE] microsphere delivery for treatment of unresectable
liver metastasis. First therapy to the RIGHT lobe. Segment 8
radiation segmentectomy.

Bremssstrahlung scan demonstrates activity localized to segment 8
RIGHT hepatic lobe with no extrahepatic activity identified.

## 2021-07-22 MED ORDER — IOHEXOL 300 MG/ML  SOLN
100.0000 mL | Freq: Once | INTRAMUSCULAR | Status: AC | PRN
  Administered 2021-07-22: 25 mL via INTRA_ARTERIAL

## 2021-07-22 MED ORDER — LIDOCAINE HCL 1 % IJ SOLN
INTRAMUSCULAR | Status: AC
  Filled 2021-07-22: qty 20

## 2021-07-22 MED ORDER — MIDAZOLAM HCL 2 MG/2ML IJ SOLN
INTRAMUSCULAR | Status: DC | PRN
  Administered 2021-07-22 (×2): 1 mg via INTRAVENOUS

## 2021-07-22 MED ORDER — FENTANYL CITRATE (PF) 100 MCG/2ML IJ SOLN
INTRAMUSCULAR | Status: AC
  Filled 2021-07-22: qty 2

## 2021-07-22 MED ORDER — DEXAMETHASONE SODIUM PHOSPHATE 10 MG/ML IJ SOLN
4.0000 mg | INTRAMUSCULAR | Status: AC
  Administered 2021-07-22: 4 mg via INTRAVENOUS
  Filled 2021-07-22: qty 1

## 2021-07-22 MED ORDER — YTTRIUM 90 INJECTION: 28.8600 | INJECTION | Freq: Once | INTRAVENOUS | Status: DC

## 2021-07-22 MED ORDER — PANTOPRAZOLE SODIUM 40 MG IV SOLR
40.0000 mg | INTRAVENOUS | Status: AC
  Administered 2021-07-22: 40 mg via INTRAVENOUS
  Filled 2021-07-22: qty 40

## 2021-07-22 MED ORDER — MIDAZOLAM HCL 2 MG/2ML IJ SOLN
INTRAMUSCULAR | Status: AC
  Filled 2021-07-22: qty 4

## 2021-07-22 MED ORDER — ONDANSETRON HCL 4 MG/2ML IJ SOLN
4.0000 mg | INTRAMUSCULAR | Status: AC
  Administered 2021-07-22: 4 mg via INTRAVENOUS
  Filled 2021-07-22: qty 2

## 2021-07-22 MED ORDER — SODIUM CHLORIDE 0.9 % IV SOLN: INTRAVENOUS | Status: DC

## 2021-07-22 MED ORDER — LIDOCAINE-EPINEPHRINE (PF) 1 %-1:200000 IJ SOLN
INTRAMUSCULAR | Status: DC | PRN
  Administered 2021-07-22: 5 mL

## 2021-07-22 MED ORDER — FENTANYL CITRATE (PF) 100 MCG/2ML IJ SOLN
INTRAMUSCULAR | Status: DC | PRN
  Administered 2021-07-22 (×2): 50 ug via INTRAVENOUS

## 2021-07-22 NOTE — Discharge Instructions (Addendum)
Please call Interventional Radiology clinic [phone] with any questions or concerns.  You may remove your dressing and shower tomorrow.  Avoid heavy lifting or strenuous activity this week.  Hepatic Artery Radioembolization, Care After The following information offers guidance on how to care for yourself after your procedure. Your health care provider may also give you more specific instructions. If you have problems or questions, contact your health care provider. What can I expect after the procedure? After the procedure, it is possible to have: A slight fever for 7 to 10 days. This may be accompanied by pain, nausea, or vomiting, which is referred to as post-embolization syndrome. You may be given medicine to help relieve these symptoms. If your fever gets worse, tell your health care provider. Tiredness (fatigue). Loss of appetite. This should gradually improve after about 1 week. Abdominal pain on your right side. Soreness and tenderness in your groin area where the needle and catheter were placed (puncture site). Follow these instructions at home:  Puncture site care Follow instructions from your health care provider about how to take care of the puncture site. Make sure you: Wash your hands with soap and water for at least 20 seconds before and after you change your bandage (dressing). If soap and water are not available, use hand sanitizer. Change your dressing as told by your health care provider. Check your puncture site every day for signs of infection. Check for: More redness, swelling, or pain. Fluid or blood. Warmth. Pus or a bad smell. Activity Rest as told by your health care provider. Return to your normal activities as told by your health care provider. Ask your health care provider what activities are safe for you. Avoid sitting for a long time without moving. Get up to take short walks every 1-2 hours. This is important to improve blood flow and breathing. Ask for  help if you feel weak or unsteady. If you were given a sedative during the procedure, it can affect you for several hours. Do not drive or operate machinery until your health care provider says that it is safe. Do not lift anything that is heavier than 10 lb (4.5 kg), or the limit that you are told, until your health care provider says that it is safe. Medicines Take over-the-counter and prescription medicines only as told by your health care provider. Ask your health care provider if the medicine prescribed to you: Requires you to avoid driving or using machinery. Can cause constipation. You may need to take these actions to prevent or treat constipation: Drink enough fluid to keep your urine pale yellow. Take over-the-counter or prescription medicines. Eat foods that are high in fiber, such as beans, whole grains, and fresh fruits and vegetables. Limit foods that are high in fat and processed sugars, such as fried or sweet foods. General instructions Eat frequent, small meals until your appetite returns. Follow instructions from your health care provider about eating or drinking restrictions. Do not take baths, swim, or use a hot tub until your health care provider approves. You may take showers. Wash your puncture site with mild soap and water, and pat the area dry. Wear compression stockings as told by your health care provider. These stockings help to prevent blood clots and reduce swelling in your legs. Keep all follow-up visits. This is important. You may need to have blood tests and imaging tests. Contact a health care provider if: You have any of these signs of infection: More redness, swelling, or pain around your  puncture site. Fluid or blood coming from your puncture site. Warmth coming from your puncture site. Pus or a bad smell coming from your puncture site. You have pain that: Gets worse. Does not get better with medicine. Feels like very bad heartburn. Is in the middle of  your abdomen, above your belly button. You have any signs of infection or liver failure, such as: Your skin or the white parts of your eyes turn yellow (jaundice). The color of your urine changes to dark brown. The color of your stool (feces) changes to light yellow. Your abdominal measurement (girth) increases in a short period of time. You gain more than 5 lb (2.3 kg) in a short period of time. Get help right away if: You have a fever that lasts more than 10 days or is higher than what your health care provider told you to expect. You develop any of the following in your legs: Pain. Swelling. Skin that is cold or pale or turns blue. You have chest pain. You have blood in your vomit, saliva, or stool. You have trouble breathing. These symptoms may represent a serious problem that is an emergency. Do not wait to see if the symptoms will go away. Get medical help right away. Call your local emergency services (911 in the [place] Do not drive yourself to the hospital. Summary After the procedure, it is possible to have a slight fever for up to 7-10 days, tiredness, loss of appetite, abdominal pain on the right side, and groin tenderness where the catheter was placed. Do not come in close contact with people for up to a week after your procedure, as told by your health care provider. Follow instructions from your health care provider about how to take care of the puncture site. Contact a health care provider if you have any signs of infection. Get help right away if you develop pain or swelling in your legs or if your legs feel cool or look pale. This information is not intended to replace advice given to you by your health care provider. Make sure you discuss any questions you have with your health care provider. Document Revised: 09/06/2020 Document Reviewed: 09/06/2020 Elsevier Patient Education  2022 [place].    Moderate Conscious Sedation, Adult, Care After This sheet gives you  information about how to care for yourself after your procedure. Your health care provider may also give you more specific instructions. If you have problems or questions, contact your health care provider. What can I expect after the procedure? After the procedure, it is common to have: Sleepiness for several hours. Impaired judgment for several hours. Difficulty with balance. Vomiting if you eat too soon. Follow these instructions at home: For the time period you were told by your health care provider: Rest. Do not participate in activities where you could fall or become injured. Do not drive or use machinery. Do not drink alcohol. Do not take sleeping pills or medicines that cause drowsiness. Do not make important decisions or sign legal documents. Do not take care of children on your own.      Eating and drinking Follow the diet recommended by your health care provider. Drink enough fluid to keep your urine pale yellow. If you vomit: Drink water, juice, or soup when you can drink without vomiting. Make sure you have little or no nausea before eating solid foods.   General instructions Take over-the-counter and prescription medicines only as told by your health care provider. Have a responsible adult  stay with you for the time you are told. It is important to have someone help care for you until you are awake and alert. Do not smoke. Keep all follow-up visits as told by your health care provider. This is important. Contact a health care provider if: You are still sleepy or having trouble with balance after 24 hours. You feel light-headed. You keep feeling nauseous or you keep vomiting. You develop a rash. You have a fever. You have redness or swelling around the IV site. Get help right away if: You have trouble breathing. You have new-onset confusion at home. Summary After the procedure, it is common to feel sleepy, have impaired judgment, or feel nauseous if you eat too  soon. Rest after you get home. Know the things you should not do after the procedure. Follow the diet recommended by your health care provider and drink enough fluid to keep your urine pale yellow. Get help right away if you have trouble breathing or new-onset confusion at home. This information is not intended to replace advice given to you by your health care provider. Make sure you discuss any questions you have with your health care provider. Document Revised: 01/31/2020 Document Reviewed: 08/29/2019 Elsevier Patient Education  [place].   Radiation precautions For up to a week after your procedure, there will be a small amount of radioactivity near your liver. This is not especially dangerous to other people. However, as told by your health care provider, you should follow these precautions for 7 days: Do not come in close contact with people. Do not sleep in the same bed as someone else. Do not hold children or babies. Do not have contact with pregnant women.  Post Y-90 Radioembolization Discharge Instructions  You have been given a radioactive material during your procedure.  While it is safe for you to be discharged home from the hospital, you need to proceed directly home.    Do not use public transportation, including air travel, lasting more than 2 hours for 1 week.  Avoid crowded public places for 1 week.  Adult visitors should try to avoid close contact with you for 1 week.    Children and pregnant females should not visit or have close contact with you for 1 week.  Items that you touch are not radioactive.  Do not sleep in the same bed as your partner for 1 week, and a condom should be used for sexual activity during the first 24 hours.  Your blood may be radioactive and caution should be used if any bleeding occurs during the recovery period.  Body fluids may be radioactive for 24 hours.  Wash your hands after voiding.  Men should sit to urinate.  Dispose of  any soiled materials (flush down toilet or place in trash at home) during the first day.  Drink 6 to 8 glasses of fluids per day for 5 days to hydrate yourself.  If you need to see a doctor during the first week, you must let them know that you were treated with yttrium-90 microspheres, and will be slightly radioactive.  They can call Interventional Radiology [phone] with any questions.

## 2021-07-22 NOTE — Procedures (Signed)
Interventional Radiology Procedure Note  Procedure: Segment 8 transarterial radiation segmentectomy with Y90.   Complications: None  Estimated Blood Loss: None  Recommendations: - Bedrest x 1 hr - DC home   Signed,  Criselda Peaches, MD

## 2021-07-22 NOTE — Sedation Documentation (Signed)
To nuc med for study 

## 2021-07-23 LAB — AFP TUMOR MARKER: AFP, Serum, Tumor Marker: 52.9 ng/mL — ABNORMAL HIGH (ref 0.0–8.4)

## 2021-08-10 ENCOUNTER — Other Ambulatory Visit: Payer: Self-pay | Admitting: Interventional Radiology

## 2021-08-10 DIAGNOSIS — C220 Liver cell carcinoma: Secondary | ICD-10-CM

## 2021-08-11 ENCOUNTER — Inpatient Hospital Stay: Payer: Medicare Other

## 2021-08-11 ENCOUNTER — Other Ambulatory Visit: Payer: Self-pay

## 2021-08-11 ENCOUNTER — Ambulatory Visit: Payer: Medicare Other | Admitting: Dietician

## 2021-08-11 ENCOUNTER — Inpatient Hospital Stay (HOSPITAL_BASED_OUTPATIENT_CLINIC_OR_DEPARTMENT_OTHER): Payer: Medicare Other

## 2021-08-11 ENCOUNTER — Inpatient Hospital Stay (HOSPITAL_BASED_OUTPATIENT_CLINIC_OR_DEPARTMENT_OTHER): Payer: Medicare Other | Admitting: Hematology

## 2021-08-11 ENCOUNTER — Encounter: Payer: Self-pay | Admitting: Hematology

## 2021-08-11 ENCOUNTER — Telehealth: Payer: Self-pay

## 2021-08-11 VITALS — BP 159/73 | HR 72 | Temp 98.4°F | Resp 18 | Ht 65.0 in | Wt 128.4 lb

## 2021-08-11 VITALS — BP 155/92 | HR 70 | Temp 98.5°F | Resp 18

## 2021-08-11 DIAGNOSIS — Z23 Encounter for immunization: Secondary | ICD-10-CM | POA: Diagnosis present

## 2021-08-11 DIAGNOSIS — C220 Liver cell carcinoma: Secondary | ICD-10-CM

## 2021-08-11 DIAGNOSIS — Z5111 Encounter for antineoplastic chemotherapy: Secondary | ICD-10-CM | POA: Diagnosis not present

## 2021-08-11 LAB — CMP (CANCER CENTER ONLY)
ALT: 316 U/L (ref 0–44)
AST: 308 U/L (ref 15–41)
Albumin: 3.8 g/dL (ref 3.5–5.0)
Alkaline Phosphatase: 75 U/L (ref 38–126)
Anion gap: 9 (ref 5–15)
BUN: 15 mg/dL (ref 8–23)
CO2: 23 mmol/L (ref 22–32)
Calcium: 9.7 mg/dL (ref 8.9–10.3)
Chloride: 104 mmol/L (ref 98–111)
Creatinine: 0.77 mg/dL (ref 0.61–1.24)
GFR, Estimated: >60 mL/min (ref >60)
Glucose, Bld: 76 mg/dL (ref 70–99)
Potassium: 3.9 mmol/L (ref 3.5–5.1)
Sodium: 136 mmol/L (ref 135–145)
Total Bilirubin: 0.9 mg/dL (ref 0.3–1.2)
Total Protein: 8.3 g/dL — ABNORMAL HIGH (ref 6.5–8.1)

## 2021-08-11 LAB — CBC WITH DIFFERENTIAL (CANCER CENTER ONLY)
Abs Immature Granulocytes: 0.01 10*3/uL (ref 0.00–0.07)
Basophils Absolute: 0 10*3/uL (ref 0.0–0.1)
Basophils Relative: 1 %
Eosinophils Absolute: 0.1 10*3/uL (ref 0.0–0.5)
Eosinophils Relative: 3 %
HCT: 39.4 % (ref 39.0–52.0)
Hemoglobin: 13.8 g/dL (ref 13.0–17.0)
Immature Granulocytes: 0 %
Lymphocytes Relative: 23 %
Lymphs Abs: 0.8 10*3/uL (ref 0.7–4.0)
MCH: 31.8 pg (ref 26.0–34.0)
MCHC: 35 g/dL (ref 30.0–36.0)
MCV: 90.8 fL (ref 80.0–100.0)
Monocytes Absolute: 0.8 10*3/uL (ref 0.1–1.0)
Monocytes Relative: 21 %
Neutro Abs: 1.9 10*3/uL (ref 1.7–7.7)
Neutrophils Relative %: 52 %
Platelet Count: 122 10*3/uL — ABNORMAL LOW (ref 150–400)
RBC: 4.34 MIL/uL (ref 4.22–5.81)
RDW: 12.4 % (ref 11.5–15.5)
WBC Count: 3.6 10*3/uL — ABNORMAL LOW (ref 4.0–10.5)
nRBC: 0 % (ref 0.0–0.2)

## 2021-08-11 LAB — TOTAL PROTEIN, URINE DIPSTICK: Protein, ur: NEGATIVE mg/dL

## 2021-08-11 MED ORDER — SODIUM CHLORIDE 0.9 % IV SOLN
15.0000 mg/kg | Freq: Once | INTRAVENOUS | Status: AC
  Administered 2021-08-11: 900 mg via INTRAVENOUS
  Filled 2021-08-11: qty 32

## 2021-08-11 MED ORDER — HYDROCODONE-ACETAMINOPHEN 5-325 MG PO TABS: 1.0000 | ORAL_TABLET | Freq: Three times a day (TID) | ORAL | 0 refills | Status: DC | PRN

## 2021-08-11 MED ORDER — SODIUM CHLORIDE 0.9 % IV SOLN
1200.0000 mg | Freq: Once | INTRAVENOUS | Status: AC
  Administered 2021-08-11: 1200 mg via INTRAVENOUS
  Filled 2021-08-11: qty 20

## 2021-08-11 MED ORDER — SODIUM CHLORIDE 0.9 % IV SOLN: Freq: Once | INTRAVENOUS | Status: AC

## 2021-08-11 NOTE — Patient Instructions (Addendum)
[facility]   Discharge Instructions: Thank you for choosing [facility] to provide your oncology and hematology care.   If you have a lab appointment with the [facility], please go directly to the [facility] and check in at the registration area.   Wear comfortable clothing and clothing appropriate for easy access to any Portacath or PICC line.   We strive to give you quality time with your provider. You may need to reschedule your appointment if you arrive late (15 or more minutes).  Arriving late affects you and other patients whose appointments are after yours.  Also, if you miss three or more appointments without notifying the office, you may be dismissed from the clinic at the provider's discretion.      For prescription refill requests, have your pharmacy contact our office and allow 72 hours for refills to be completed.    Today you received the following chemotherapy and/or immunotherapy agents: atezolizumab and bevacizumab      To help prevent nausea and vomiting after your treatment, we encourage you to take your nausea medication as directed.  BELOW ARE SYMPTOMS THAT SHOULD BE REPORTED IMMEDIATELY: *FEVER GREATER THAN 100.4 F (38 C) OR HIGHER *CHILLS OR SWEATING *NAUSEA AND VOMITING THAT IS NOT CONTROLLED WITH YOUR NAUSEA MEDICATION *UNUSUAL SHORTNESS OF BREATH *UNUSUAL BRUISING OR BLEEDING *URINARY PROBLEMS (pain or burning when urinating, or frequent urination) *BOWEL PROBLEMS (unusual diarrhea, constipation, pain near the anus) TENDERNESS IN MOUTH AND THROAT WITH OR WITHOUT PRESENCE OF ULCERS (sore throat, sores in mouth, or a toothache) UNUSUAL RASH, SWELLING OR PAIN  UNUSUAL VAGINAL DISCHARGE OR ITCHING   Items with * indicate a potential emergency and should be followed up as soon as possible or go to the Emergency Department if any problems should occur.  Please show the CHEMOTHERAPY ALERT CARD or IMMUNOTHERAPY ALERT  CARD at check-in to the Emergency Department and triage nurse.  Should you have questions after your visit or need to cancel or reschedule your appointment, please contact [facility]  Dept: [phone]  and follow the prompts.  Office hours are 8:00 a.m. to 4:30 p.m. Monday - Friday. Please note that voicemails left after 4:00 p.m. may not be returned until the following business day.  We are closed weekends and major holidays. You have access to a nurse at all times for urgent questions. Please call the main number to the clinic Dept: [phone] and follow the prompts.   For any non-urgent questions, you may also contact your provider using MyChart. We now offer e-Visits for anyone 73 and older to request care online for non-urgent symptoms. For details visit [email or web address].   Also download the MyChart app! Go to the app store, search "MyChart", open the app, select [facility], and log in with your MyChart username and password.  Due to Covid, a mask is required upon entering the hospital/clinic. If you do not have a mask, one will be given to you upon arrival. For doctor visits, patients may have 1 support person aged 68 or older with them. For treatment visits, patients cannot have anyone with them due to current Covid guidelines and our immunocompromised population.

## 2021-08-11 NOTE — Progress Notes (Signed)
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 Patient is s/p first Y90 on 10/6.   Met with patient during infusion per provider request. He reports decrease in appetite after radiation treatment. Patient is making sure he continues eating 3 meals/day. He occasionally snacks on peanut butter crackers. Patient is drinking "a lot" of water, V8 juice, whole milk, and  2 Ensure daily. Patient requesting additional complimentary case. Yesterday he ate omelette with mushroom, spinach for breakfast, tuna fish sandwich for lunch, BBQ chicken, potatoes, glass of whole milk for dinner. He denies constipation, diarrhea, nausea, vomiting.    Medications: Norco  Labs: AST 308, ALT 316  Anthropometrics: Weight 128 lb 6.4 oz today. Patient weighed 129 lb 11.2 oz on 10/5   132 lb 11.2 oz on 9/14 128 lb 11.2 oz on 8/25  NUTRITION DIAGNOSIS: Food and nutrition knowledge related deficit continues   INTERVENTION:  Encouraged small frequent meals and snacks with adequate calories and protein Continue drinking 2 Ensure Plus/equivalent daily Patient provided complimentary case of Ensure Enlive (350 kcal, 20 grams protein) Reviewed strategies for adding calories and protein to foods - pt has handout Patient will have bedtime snack for added calories and protein Patient has contact information    MONITORING, EVALUATION, GOAL: weight trends, intake   NEXT VISIT: Tuesday December 6 during infusion

## 2021-08-11 NOTE — Progress Notes (Signed)
Per Dr. Burr Medico - okay to to treat with elevated LFTs and without current urine protein level. Patient will have urine protein collected today.

## 2021-08-11 NOTE — Telephone Encounter (Signed)
CRITICAL VALUE STICKER  CRITICAL VALUE: AST=308, ALT=316   RECEIVER (on-site recipient of call): Kobe Jansma P. LPN  DATE & TIME NOTIFIED: 08/11/2021 10:09 am   MESSENGER (representative from lab): Verdis Frederickson   MD NOTIFIED:  Dr. Burr Medico   TIME OF NOTIFICATION: 10:112 am

## 2021-08-11 NOTE — Progress Notes (Signed)
Per Dr. Burr Medico, "OK To Treat w/elevated Cr+ today".

## 2021-08-11 NOTE — Progress Notes (Signed)
   Covid-19 Vaccination Clinic  Name:  Shawn Bailey    MRN: [id number] DOB: August 08, 1946  08/11/2021  [name] was observed post Covid-19 immunization for 15 minutes without incident. He was provided with Vaccine Information Sheet and instruction to access the V-Safe system.   [name] was instructed to call 911 with any severe reactions post vaccine: Difficulty breathing  Swelling of face and throat  A fast heartbeat  A bad rash all over body  Dizziness and weakness   Immunizations Administered     Name Date Dose VIS Date Route   Pfizer Covid-19 Vaccine Bivalent Booster 08/11/2021  1:39 PM 0.3 mL 06/16/2021 Intramuscular   Manufacturer: [place]   Lot: [id number]   [place]: [phone]

## 2021-08-11 NOTE — Progress Notes (Signed)
[place]   [phone] [phone]   Clinic Follow up Note   Patient Care Team: Pa, [facility] as PCP - General (Family Medicine) [name], MD as Consulting Physician (Hematology and Oncology) [name], RN (Inactive) as Oncology Nurse Navigator  Date of Service:  08/11/2021  CHIEF COMPLAINT: f/u of liver cancer  CURRENT THERAPY:  -Atezolizumab and bevacizumab q 3 weeks -Y90 on 07/01/21 and 07/22/21  ASSESSMENT & PLAN:  Shawn Bailey is a 75 y.o. male with   1.  Multifocal hepatocellular carcinoma, cT3N0M, stage IIIA -Liver MRI 03/06/21 showed a large 9.5 cm and a 1.3 cm liver mass is seen in right lobe, biopsy 03/08/21 confirmed moderately differentiated hepatocellular carcinoma.  No nodal or distant metastasis on CT 03/05/21. -This is likely related to his HCV infection -He is not a candidate for liver resection or transplant. Dr. [name] at [facility] did not recommend surgery given the location and extent of disease. -He started atezolizumab and bevacizumab on 04/28/21, while waiting for liver radioembolization. He tolerates this well with mild fatigue. He overall feels better after started treatment  -His AFP has dropped significantly which likely indicating good response  -he underwent Y90 on 07/22/21. They may consider a second procedure after restaging scan in 10/2021. -labs pending today. We will restart Beva and continue Tecentriq every 3 weeks   2. Symptom management: Weight loss, decreased appetite, fatigue, pain -Secondary to liver cancer -he has diminished appetite and taste changes, causing weight loss. He is supplementing with two Ensure a day. I will provide him with a case today. -He will continue follow with our nutritionist as needed -he reports using Norco up to two times a day. I refilled today.   3. Untreated HCV  -During 02/2021 Hospitalization, labs showed HIV non reactive and hepatitis panel hep C ab  positive, he was not aware that he has HCV infection -due to his recent [facility] diagnosis, will hold off HCV treatment for now -he does not see any doctors regularly. I previously referred him to the liver clinic for evaluation of his cancer, HCV, and for endoscopy when needed. This has not yet been scheduled.     PLAN: -I refilled his Norco today -Lab reviewed, worse transaminitis, likely secondary to recent Y 90.  Normal bilirubin and alkaline phosphatase, will proceed with atezolizumab and Beva today -labs, f/u, and atezolizumab/Beva in 3 and 6 weeks   No problem-specific Assessment & Plan notes found for this encounter.   SUMMARY OF ONCOLOGIC HISTORY: Oncology History Overview Note  Cancer Staging Hepatocellular carcinoma [facility]) Staging form: Liver, AJCC 8th Edition - Clinical stage from 03/08/2021: Stage IIIA (cT3, cN0, cM0) - Signed by [name], MD on 03/11/2021 Stage prefix: Initial diagnosis Histologic grade (G): G2 Histologic grading system: 4 grade system     Hepatocellular carcinoma ([place])   Initial Diagnosis   Liver mass, right lobe   03/05/2021 Imaging   CT CAP  IMPRESSION: 1. Right hepatic lobe masses which are suspicious for either metastatic disease or primary malignancy such as hepatocellular carcinoma. These are suboptimally evaluated on this noncontrast exam. Consider multidisciplinary oncology consultation with potential clinical strategies of tissue sampling versus PET to direct sampling. 2. No primary malignancy identified within the chest, abdomen, or pelvis, given limitation of noncontrast exam. 3. Left nephrolithiasis. 4. Coronary artery atherosclerosis. Aortic Atherosclerosis (ICD10-I70.0). 5. Esophageal air fluid level suggests dysmotility or gastroesophageal reflux.   03/06/2021 Imaging   MRI Liver  IMPRESSION: 9.5 cm  heterogeneous hypovascular mass in anterior right hepatic lobe, and 1.3 cm hypervascular mass in the posterior right hepatic lobe.  Differential diagnosis includes liver metastases and multifocal hepatocellular carcinoma. Suggest correlation with serum AFP level, and possible tissue sampling.   No evidence of abdominal metastatic disease.   03/08/2021 Initial Biopsy   A. LIVER, RIGHT, BIOPSY:  - Hepatocellular carcinoma.   COMMENT:  The hepatocellular carcinoma is moderately differentiated and associated with areas of necrosis.  Immunohistochemistry shows positivity with glypican-3 and weak staining with HepPar 1.  The tumor is negative with arginase 1, alpha-fetoprotein, CDX2, cytokeratin 7, cytokeratin 20, cytokeratin AE1/AE3, PSA, prostein and MOC31.  Polyclonal CEA shows focal canalicular pattern.  The morphology and immunophenotype are consistent with hepatocellular carcinoma.    03/08/2021 Cancer Staging   Staging form: Liver, AJCC 8th Edition - Clinical stage from 03/08/2021: Stage IIIA (cT3, cN0, cM0) - Signed by [name], MD on 03/11/2021 Stage prefix: Initial diagnosis Histologic grade (G): G2 Histologic grading system: 4 grade system    04/28/2021 -  Chemotherapy   Patient is on Treatment Plan : LUNG Atezolizumab + Bevacizumab q21d Maintenance     06/16/2021 Imaging   CT Abdomen  IMPRESSION: Marked interval decrease in size of the large RIGHT hepatic lobe lesion, biopsy-proven hepatocellular carcinoma. No arterial phase enhancement on early phase, vague enhancement on later phase greater than 20 Hounsfield unit enhancement on venous phase. Less pronounced arterial enhancement than on previous imaging with small peripheral foci of enhancement on arterial phase particularly along the margin of the tumor near the dome of the liver (image 12/2) 11 mm focus of enhancement in this area.   Interval response to therapy of a small lesion previously compatible with LI-RADS category 5 lesion in the posterior RIGHT hepatic lobe.   No new hepatic lesion.   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   Signs of nephrolithiasis calculus on the LEFT measuring approximately 7 mm.   Colonic diverticulosis without evidence of acute diverticulitis.   Aortic Atherosclerosis (ICD10-I70.0).      INTERVAL HISTORY:  [facility] is here for a follow up of liver cancer. He was last seen by me on 07/21/21. He presents to the clinic accompanied by his ex-wife and her husband. He lost a few pounds since his last visit. His ex-wife reports the Y90 caused some difficulty eating. She reports they were told they would wait until 10/2021 before deciding if he needs another Y90 procedure. He denies any issues from the actual procedure.   All other systems were reviewed with the patient and are negative.  MEDICAL HISTORY:  Past Medical History:  Diagnosis Date   Depression    Hepatocellular carcinoma ([place]) 03/08/2021   Parasite infestation     SURGICAL HISTORY: Past Surgical History:  Procedure Laterality Date   IR ANGIOGRAM SELECTIVE EACH ADDITIONAL VESSEL  07/01/2021   IR ANGIOGRAM SELECTIVE EACH ADDITIONAL VESSEL  07/22/2021   IR ANGIOGRAM SELECTIVE EACH ADDITIONAL VESSEL  07/22/2021   IR ANGIOGRAM SELECTIVE EACH ADDITIONAL VESSEL  07/22/2021   IR ANGIOGRAM SELECTIVE EACH ADDITIONAL VESSEL  07/22/2021   IR ANGIOGRAM VISCERAL SELECTIVE  07/01/2021   IR ANGIOGRAM VISCERAL SELECTIVE  07/22/2021   IR EMBO ARTERIAL NOT HEMORR HEMANG INC GUIDE ROADMAPPING  07/01/2021   IR EMBO TUMOR ORGAN ISCHEMIA INFARCT INC GUIDE ROADMAPPING  07/22/2021   IR RADIOLOGIST EVAL & MGMT  06/01/2021   IR US GUIDE VASC ACCESS RIGHT  07/01/2021   IR US GUIDE VASC  ACCESS RIGHT  07/22/2021   NO PAST SURGERIES      I have reviewed the social history and family history with the patient and they are unchanged from previous note.  ALLERGIES:  has No Known Allergies.  MEDICATIONS:  Current Outpatient Medications  Medication Sig Dispense Refill   Ascorbic Acid (VITAMIN C) 100 MG tablet Take 100 mg by mouth  daily.     HYDROcodone-acetaminophen (NORCO) 5-325 MG tablet Take 1 tablet by mouth every 8 (eight) hours as needed for moderate pain. 60 tablet 0   Multiple Vitamin (MULTIVITAMIN WITH MINERALS) TABS tablet Take 1 tablet by mouth daily.     ondansetron (ZOFRAN) 8 MG tablet Take 1 tablet (8 mg total) by mouth every 8 (eight) hours as needed for nausea or vomiting. 30 tablet 0   polyethylene glycol (MIRALAX / GLYCOLAX) 17 g packet Take 17 g by mouth daily as needed for mild constipation. 14 each 0   prochlorperazine (COMPAZINE) 10 MG tablet Take 1 tablet (10 mg total) by mouth every 6 (six) hours as needed for nausea or vomiting. 30 tablet 0   senna (SENOKOT) 8.6 MG TABS tablet Take 1 tablet (8.6 mg total) by mouth 2 (two) times daily. 120 tablet 0   vitamin B-12 (CYANOCOBALAMIN) 100 MCG tablet Take 100 mcg by mouth daily.     No current facility-administered medications for this visit.   Facility-Administered Medications Ordered in Other Visits  Medication Dose Route Frequency Provider Last Rate Last Admin   0.9 %  sodium chloride infusion   Intravenous Once [name], MD        PHYSICAL EXAMINATION: ECOG PERFORMANCE STATUS: 1 - Symptomatic but completely ambulatory  Vitals:   08/11/21 0938  BP: (!) 159/73  Pulse: 72  Resp: 18  Temp: 98.4 F (36.9 C)  SpO2: 99%   Wt Readings from Last 3 Encounters:  08/11/21 58.2 kg  07/21/21 58.8 kg  06/30/21 60.2 kg     GENERAL:alert, no distress and comfortable SKIN: skin color normal, no rashes or significant lesions EYES: normal, Conjunctiva are pink and non-injected, sclera clear  NEURO: alert & oriented x 3 with fluent speech  LABORATORY DATA:  I have reviewed the data as listed CBC Latest Ref Rng & Units 08/11/2021 07/22/2021 07/21/2021  WBC 4.0 - 10.5 K/uL 3.6(L) 3.6(L) 4.5  Hemoglobin 13.0 - 17.0 g/dL 13.8 14.1 13.7  Hematocrit 39.0 - 52.0 % 39.4 41.0 40.1  Platelets 150 - 400 K/uL 122(L) 105(L) 113(L)     CMP Latest Ref Rng &  Units 08/11/2021 07/22/2021 07/21/2021  Glucose 70 - 99 mg/dL 76 92 93  BUN 8 - 23 mg/dL 15 12 11   Creatinine 0.61 - 1.24 mg/dL 0.77 0.65 0.81  Sodium 135 - 145 mmol/L 136 135 139  Potassium 3.5 - 5.1 mmol/L 3.9 3.6 4.0  Chloride 98 - 111 mmol/L 104 104 106  CO2 22 - 32 mmol/L 23 26 24   Calcium 8.9 - 10.3 mg/dL 9.7 9.4 9.7  Total Protein 6.5 - 8.1 g/dL 8.3(H) 8.3(H) 8.3(H)  Total Bilirubin 0.3 - 1.2 mg/dL 0.9 1.1 0.5  Alkaline Phos 38 - 126 U/L 75 52 62  AST 15 - 41 U/L 308(HH) 157(H) 153(H)  ALT 0 - 44 U/L 316(HH) 150(H) 157(H)      RADIOGRAPHIC STUDIES: I have personally reviewed the radiological images as listed and agreed with the findings in the report. No results found.    Orders Placed This Encounter  Procedures   Total  Protein, Urine dipstick    Standing Status:   Standing    Number of Occurrences:   10    Standing Expiration Date:   08/11/2022   All questions were answered. The patient knows to call the clinic with any problems, questions or concerns. No barriers to learning was detected. The total time spent in the appointment was 30 minutes.     [name], MD 08/11/2021   I, Wilburn Mylar, am acting as scribe for [name], MD.   I have reviewed the above documentation for accuracy and completeness, and I agree with the above.

## 2021-08-31 ENCOUNTER — Encounter: Payer: Self-pay | Admitting: Nutrition

## 2021-08-31 ENCOUNTER — Telehealth: Payer: Self-pay | Admitting: *Deleted

## 2021-08-31 ENCOUNTER — Other Ambulatory Visit: Payer: Self-pay

## 2021-08-31 ENCOUNTER — Ambulatory Visit: Payer: Medicare Other

## 2021-08-31 ENCOUNTER — Encounter: Payer: Self-pay | Admitting: Hematology

## 2021-08-31 ENCOUNTER — Inpatient Hospital Stay: Payer: Medicare Other | Attending: Hematology

## 2021-08-31 ENCOUNTER — Inpatient Hospital Stay (HOSPITAL_BASED_OUTPATIENT_CLINIC_OR_DEPARTMENT_OTHER): Payer: Medicare Other | Admitting: Hematology

## 2021-08-31 VITALS — BP 155/86 | HR 78 | Temp 98.2°F | Resp 18 | Ht 65.0 in | Wt 128.6 lb

## 2021-08-31 DIAGNOSIS — I700 Atherosclerosis of aorta: Secondary | ICD-10-CM | POA: Diagnosis not present

## 2021-08-31 DIAGNOSIS — Z79899 Other long term (current) drug therapy: Secondary | ICD-10-CM | POA: Insufficient documentation

## 2021-08-31 DIAGNOSIS — R634 Abnormal weight loss: Secondary | ICD-10-CM | POA: Insufficient documentation

## 2021-08-31 DIAGNOSIS — K5730 Diverticulosis of large intestine without perforation or abscess without bleeding: Secondary | ICD-10-CM | POA: Insufficient documentation

## 2021-08-31 DIAGNOSIS — C220 Liver cell carcinoma: Secondary | ICD-10-CM

## 2021-08-31 DIAGNOSIS — I2510 Atherosclerotic heart disease of native coronary artery without angina pectoris: Secondary | ICD-10-CM | POA: Insufficient documentation

## 2021-08-31 DIAGNOSIS — N200 Calculus of kidney: Secondary | ICD-10-CM | POA: Insufficient documentation

## 2021-08-31 DIAGNOSIS — Z5112 Encounter for antineoplastic immunotherapy: Secondary | ICD-10-CM | POA: Diagnosis present

## 2021-08-31 DIAGNOSIS — B1920 Unspecified viral hepatitis C without hepatic coma: Secondary | ICD-10-CM | POA: Diagnosis not present

## 2021-08-31 LAB — CBC WITH DIFFERENTIAL (CANCER CENTER ONLY)
Abs Immature Granulocytes: 0 10*3/uL (ref 0.00–0.07)
Basophils Absolute: 0 10*3/uL (ref 0.0–0.1)
Basophils Relative: 0 %
Eosinophils Absolute: 0 10*3/uL (ref 0.0–0.5)
Eosinophils Relative: 1 %
HCT: 38.8 % — ABNORMAL LOW (ref 39.0–52.0)
Hemoglobin: 13.2 g/dL (ref 13.0–17.0)
Immature Granulocytes: 0 %
Lymphocytes Relative: 22 %
Lymphs Abs: 0.7 10*3/uL (ref 0.7–4.0)
MCH: 31.3 pg (ref 26.0–34.0)
MCHC: 34 g/dL (ref 30.0–36.0)
MCV: 91.9 fL (ref 80.0–100.0)
Monocytes Absolute: 0.5 10*3/uL (ref 0.1–1.0)
Monocytes Relative: 15 %
Neutro Abs: 2 10*3/uL (ref 1.7–7.7)
Neutrophils Relative %: 62 %
Platelet Count: 103 10*3/uL — ABNORMAL LOW (ref 150–400)
RBC: 4.22 MIL/uL (ref 4.22–5.81)
RDW: 12.8 % (ref 11.5–15.5)
WBC Count: 3.2 10*3/uL — ABNORMAL LOW (ref 4.0–10.5)
nRBC: 0 % (ref 0.0–0.2)

## 2021-08-31 LAB — CMP (CANCER CENTER ONLY)
ALT: 199 U/L — ABNORMAL HIGH (ref 0–44)
AST: 265 U/L (ref 15–41)
Albumin: 3.4 g/dL — ABNORMAL LOW (ref 3.5–5.0)
Alkaline Phosphatase: 74 U/L (ref 38–126)
Anion gap: 8 (ref 5–15)
BUN: 16 mg/dL (ref 8–23)
CO2: 24 mmol/L (ref 22–32)
Calcium: 9 mg/dL (ref 8.9–10.3)
Chloride: 104 mmol/L (ref 98–111)
Creatinine: 0.78 mg/dL (ref 0.61–1.24)
GFR, Estimated: >60 mL/min (ref >60)
Glucose, Bld: 83 mg/dL (ref 70–99)
Potassium: 4.1 mmol/L (ref 3.5–5.1)
Sodium: 136 mmol/L (ref 135–145)
Total Bilirubin: 0.5 mg/dL (ref 0.3–1.2)
Total Protein: 7.7 g/dL (ref 6.5–8.1)

## 2021-08-31 MED ORDER — SODIUM CHLORIDE 0.9 % IV SOLN: Freq: Once | INTRAVENOUS | Status: AC

## 2021-08-31 MED ORDER — SODIUM CHLORIDE 0.9 % IV SOLN
15.0000 mg/kg | Freq: Once | INTRAVENOUS | Status: AC
  Administered 2021-08-31: 900 mg via INTRAVENOUS
  Filled 2021-08-31: qty 4

## 2021-08-31 MED ORDER — SODIUM CHLORIDE 0.9 % IV SOLN
1200.0000 mg | Freq: Once | INTRAVENOUS | Status: AC
  Administered 2021-08-31: 1200 mg via INTRAVENOUS
  Filled 2021-08-31: qty 20

## 2021-08-31 NOTE — Patient Instructions (Signed)
[facility]   Discharge Instructions: Thank you for choosing [facility] to provide your oncology and hematology care.   If you have a lab appointment with the [facility], please go directly to the [facility] and check in at the registration area.   Wear comfortable clothing and clothing appropriate for easy access to any Portacath or PICC line.   We strive to give you quality time with your provider. You may need to reschedule your appointment if you arrive late (15 or more minutes).  Arriving late affects you and other patients whose appointments are after yours.  Also, if you miss three or more appointments without notifying the office, you may be dismissed from the clinic at the provider's discretion.      For prescription refill requests, have your pharmacy contact our office and allow 72 hours for refills to be completed.    Today you received the following chemotherapy and/or immunotherapy agents: atezolizumab and bevacizumab      To help prevent nausea and vomiting after your treatment, we encourage you to take your nausea medication as directed.  BELOW ARE SYMPTOMS THAT SHOULD BE REPORTED IMMEDIATELY: *FEVER GREATER THAN 100.4 F (38 C) OR HIGHER *CHILLS OR SWEATING *NAUSEA AND VOMITING THAT IS NOT CONTROLLED WITH YOUR NAUSEA MEDICATION *UNUSUAL SHORTNESS OF BREATH *UNUSUAL BRUISING OR BLEEDING *URINARY PROBLEMS (pain or burning when urinating, or frequent urination) *BOWEL PROBLEMS (unusual diarrhea, constipation, pain near the anus) TENDERNESS IN MOUTH AND THROAT WITH OR WITHOUT PRESENCE OF ULCERS (sore throat, sores in mouth, or a toothache) UNUSUAL RASH, SWELLING OR PAIN  UNUSUAL VAGINAL DISCHARGE OR ITCHING   Items with * indicate a potential emergency and should be followed up as soon as possible or go to the Emergency Department if any problems should occur.  Please show the CHEMOTHERAPY ALERT CARD or IMMUNOTHERAPY ALERT  CARD at check-in to the Emergency Department and triage nurse.  Should you have questions after your visit or need to cancel or reschedule your appointment, please contact [facility]  Dept: [phone]  and follow the prompts.  Office hours are 8:00 a.m. to 4:30 p.m. Monday - Friday. Please note that voicemails left after 4:00 p.m. may not be returned until the following business day.  We are closed weekends and major holidays. You have access to a nurse at all times for urgent questions. Please call the main number to the clinic Dept: [phone] and follow the prompts.   For any non-urgent questions, you may also contact your provider using MyChart. We now offer e-Visits for anyone 73 and older to request care online for non-urgent symptoms. For details visit [email or web address].   Also download the MyChart app! Go to the app store, search "MyChart", open the app, select [facility], and log in with your MyChart username and password.  Due to Covid, a mask is required upon entering the hospital/clinic. If you do not have a mask, one will be given to you upon arrival. For doctor visits, patients may have 1 support person aged 68 or older with them. For treatment visits, patients cannot have anyone with them due to current Covid guidelines and our immunocompromised population.

## 2021-08-31 NOTE — Progress Notes (Signed)
Provided 1 complementary case of Ensure Plus. 

## 2021-08-31 NOTE — Telephone Encounter (Signed)
Critical Sticker: AST [id number] From Pam in [facility] @1009  Dr.Feng notified @ [phone]

## 2021-08-31 NOTE — Progress Notes (Signed)
[place]   Telephone:(336) [phone] Fax:(336) [phone]   Clinic Follow up Note   Patient Care Team: Pa, [facility] as PCP - General (Family Medicine) [name], MD as Consulting Physician (Hematology and Oncology) [name], RN (Inactive) as Oncology Nurse Navigator  Date of Service:  08/31/2021  CHIEF COMPLAINT: f/u of liver cancer  CURRENT THERAPY:  -Atezolizumab and bevacizumab q 3 weeks -Y90 on 07/01/21 and 07/22/21  ASSESSMENT & PLAN:  Shawn Bailey is a 75 y.o. male with   1.  Multifocal hepatocellular carcinoma, cT3N0M, stage IIIA -Liver MRI 03/06/21 showed a large 9.5 cm and a 1.3 cm liver mass is seen in right lobe, biopsy 03/08/21 confirmed moderately differentiated hepatocellular carcinoma.  No nodal or distant metastasis on CT 03/05/21. -This is likely related to his HCV infection -He is not a candidate for liver resection or transplant. Dr. [name] at [facility] did not recommend surgery given the location and extent of disease. -He started atezolizumab and bevacizumab on 04/28/21, while waiting for liver radioembolization. He tolerates this well with mild fatigue. He overall feels better after started treatment  -His AFP dropped significantly which likely indicating good response  -he underwent Y90 on 07/22/21. They may consider a second procedure after restaging scan in 10/2021. -his first AFP s/p second Y90 today is still pending. -labs pending today. We will continue Beva and Tecentriq every 3 weeks -Plan to repeat a CT abdomen pelvis with contrast in 5 to 6 weeks   2. Symptom management: Weight loss, decreased appetite, fatigue, pain -Secondary to liver cancer -he has diminished appetite and taste changes, causing weight loss. He is supplementing with three Ensure a day. We will supply him with another case today. -He will continue follow with our nutritionist as needed -he reports using Norco up to two times a day.    3.  Untreated HCV  -During 02/2021 Hospitalization, labs showed HIV non reactive and hepatitis panel hep C ab positive, he was not aware that he has HCV infection -due to his recent [facility] diagnosis, will hold off HCV treatment for now -he does not see any doctors regularly. I previously referred him to the liver clinic for evaluation of his cancer, HCV, and for endoscopy when needed. This has not yet been scheduled.     PLAN: -proceed with atezolizumab and Beva today  -labs, f/u, and atezolizumab/Beva in 3 weeks -restaging CT AP w contrast in 5-6 weeks   No problem-specific Assessment & Plan notes found for this encounter.   SUMMARY OF ONCOLOGIC HISTORY: Oncology History Overview Note  Cancer Staging Hepatocellular carcinoma [facility]) Staging form: Liver, AJCC 8th Edition - Clinical stage from 03/08/2021: Stage IIIA (cT3, cN0, cM0) - Signed by [name], MD on 03/11/2021 Stage prefix: Initial diagnosis Histologic grade (G): G2 Histologic grading system: 4 grade system     Hepatocellular carcinoma ([place])   Initial Diagnosis   Liver mass, right lobe   03/05/2021 Imaging   CT CAP  IMPRESSION: 1. Right hepatic lobe masses which are suspicious for either metastatic disease or primary malignancy such as hepatocellular carcinoma. These are suboptimally evaluated on this noncontrast exam. Consider multidisciplinary oncology consultation with potential clinical strategies of tissue sampling versus PET to direct sampling. 2. No primary malignancy identified within the chest, abdomen, or pelvis, given limitation of noncontrast exam. 3. Left nephrolithiasis. 4. Coronary artery atherosclerosis. Aortic Atherosclerosis (ICD10-I70.0). 5. Esophageal air fluid level suggests dysmotility or gastroesophageal reflux.   03/06/2021 Imaging   MRI  Liver  IMPRESSION: 9.5 cm heterogeneous hypovascular mass in anterior right hepatic lobe, and 1.3 cm hypervascular mass in the posterior right hepatic lobe.  Differential diagnosis includes liver metastases and multifocal hepatocellular carcinoma. Suggest correlation with serum AFP level, and possible tissue sampling.   No evidence of abdominal metastatic disease.   03/08/2021 Initial Biopsy   A. LIVER, RIGHT, BIOPSY:  - Hepatocellular carcinoma.   COMMENT:  The hepatocellular carcinoma is moderately differentiated and associated with areas of necrosis.  Immunohistochemistry shows positivity with glypican-3 and weak staining with HepPar 1.  The tumor is negative with arginase 1, alpha-fetoprotein, CDX2, cytokeratin 7, cytokeratin 20, cytokeratin AE1/AE3, PSA, prostein and MOC31.  Polyclonal CEA shows focal canalicular pattern.  The morphology and immunophenotype are consistent with hepatocellular carcinoma.    03/08/2021 Cancer Staging   Staging form: Liver, AJCC 8th Edition - Clinical stage from 03/08/2021: Stage IIIA (cT3, cN0, cM0) - Signed by [name], MD on 03/11/2021 Stage prefix: Initial diagnosis Histologic grade (G): G2 Histologic grading system: 4 grade system    04/28/2021 -  Chemotherapy   Patient is on Treatment Plan : LUNG Atezolizumab + Bevacizumab q21d Maintenance     06/16/2021 Imaging   CT Abdomen  IMPRESSION: Marked interval decrease in size of the large RIGHT hepatic lobe lesion, biopsy-proven hepatocellular carcinoma. No arterial phase enhancement on early phase, vague enhancement on later phase greater than 20 Hounsfield unit enhancement on venous phase. Less pronounced arterial enhancement than on previous imaging with small peripheral foci of enhancement on arterial phase particularly along the margin of the tumor near the dome of the liver (image 12/2) 11 mm focus of enhancement in this area.   Interval response to therapy of a small lesion previously compatible with LI-RADS category 5 lesion in the posterior RIGHT hepatic lobe.   No new hepatic lesion.   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   Signs of nephrolithiasis calculus on the LEFT measuring approximately 7 mm.   Colonic diverticulosis without evidence of acute diverticulitis.   Aortic Atherosclerosis (ICD10-I70.0).      INTERVAL HISTORY:  Shawn Bailey is here for a follow up of liver cancer. He was last seen by me on 08/11/21. He presents to the clinic accompanied by his ex-wife and her husband. She reports he had trouble eating initially after his infusion, which she attributes to his booster shot given the same day. He reports he has been eating well the last two weeks. She reports he is taking oxycodone 1-2 times a day.   All other systems were reviewed with the patient and are negative.  MEDICAL HISTORY:  Past Medical History:  Diagnosis Date   Depression    Hepatocellular carcinoma ([place]) 03/08/2021   Parasite infestation     SURGICAL HISTORY: Past Surgical History:  Procedure Laterality Date   IR ANGIOGRAM SELECTIVE EACH ADDITIONAL VESSEL  07/01/2021   IR ANGIOGRAM SELECTIVE EACH ADDITIONAL VESSEL  07/22/2021   IR ANGIOGRAM SELECTIVE EACH ADDITIONAL VESSEL  07/22/2021   IR ANGIOGRAM SELECTIVE EACH ADDITIONAL VESSEL  07/22/2021   IR ANGIOGRAM SELECTIVE EACH ADDITIONAL VESSEL  07/22/2021   IR ANGIOGRAM VISCERAL SELECTIVE  07/01/2021   IR ANGIOGRAM VISCERAL SELECTIVE  07/22/2021   IR EMBO ARTERIAL NOT HEMORR HEMANG INC GUIDE ROADMAPPING  07/01/2021   IR EMBO TUMOR ORGAN ISCHEMIA INFARCT INC GUIDE ROADMAPPING  07/22/2021   IR RADIOLOGIST EVAL & MGMT  06/01/2021   IR US GUIDE VASC ACCESS RIGHT  07/01/2021   IR  US GUIDE VASC ACCESS RIGHT  07/22/2021   NO PAST SURGERIES      I have reviewed the social history and family history with the patient and they are unchanged from previous note.  ALLERGIES:  has No Known Allergies.  MEDICATIONS:  Current Outpatient Medications  Medication Sig Dispense Refill   Ascorbic Acid (VITAMIN C) 100 MG tablet Take 100 mg by mouth daily.      HYDROcodone-acetaminophen (NORCO) 5-325 MG tablet Take 1 tablet by mouth every 8 (eight) hours as needed for moderate pain. 60 tablet 0   Multiple Vitamin (MULTIVITAMIN WITH MINERALS) TABS tablet Take 1 tablet by mouth daily.     ondansetron (ZOFRAN) 8 MG tablet Take 1 tablet (8 mg total) by mouth every 8 (eight) hours as needed for nausea or vomiting. 30 tablet 0   polyethylene glycol (MIRALAX / GLYCOLAX) 17 g packet Take 17 g by mouth daily as needed for mild constipation. 14 each 0   prochlorperazine (COMPAZINE) 10 MG tablet Take 1 tablet (10 mg total) by mouth every 6 (six) hours as needed for nausea or vomiting. 30 tablet 0   senna (SENOKOT) 8.6 MG TABS tablet Take 1 tablet (8.6 mg total) by mouth 2 (two) times daily. 120 tablet 0   vitamin B-12 (CYANOCOBALAMIN) 100 MCG tablet Take 100 mcg by mouth daily.     No current facility-administered medications for this visit.   Facility-Administered Medications Ordered in Other Visits  Medication Dose Route Frequency Provider Last Rate Last Admin   0.9 %  sodium chloride infusion   Intravenous Once [name], MD        PHYSICAL EXAMINATION: ECOG PERFORMANCE STATUS: 1 - Symptomatic but completely ambulatory  Vitals:   08/31/21 0946  BP: (!) 155/86  Pulse: 78  Resp: 18  Temp: 98.2 F (36.8 C)  SpO2: 98%   Wt Readings from Last 3 Encounters:  08/31/21 128 lb 9.6 oz (58.3 kg)  08/11/21 128 lb 6.4 oz (58.2 kg)  07/21/21 129 lb 11.2 oz (58.8 kg)     GENERAL:alert, no distress and comfortable SKIN: skin color, texture, turgor are normal, no rashes or significant lesions EYES: normal, Conjunctiva are pink and non-injected, sclera clear  LUNGS: clear to auscultation and percussion with normal breathing effort HEART: regular rate & rhythm and no murmurs and no lower extremity edema ABDOMEN:abdomen soft, non-tender and normal bowel sounds, (+) mild liver enlargement  Musculoskeletal:no cyanosis of digits and no clubbing  NEURO: alert &  oriented x 3 with fluent speech, no focal motor/sensory deficits  LABORATORY DATA:  I have reviewed the data as listed CBC Latest Ref Rng & Units 08/31/2021 08/11/2021 07/22/2021  WBC 4.0 - 10.5 K/uL 3.2(L) 3.6(L) 3.6(L)  Hemoglobin 13.0 - 17.0 g/dL 13.2 13.8 14.1  Hematocrit 39.0 - 52.0 % 38.8(L) 39.4 41.0  Platelets 150 - 400 K/uL 103(L) 122(L) 105(L)     CMP Latest Ref Rng & Units 08/31/2021 08/11/2021 07/22/2021  Glucose 70 - 99 mg/dL 83 76 92  BUN 8 - 23 mg/dL 16 15 12   Creatinine 0.61 - 1.24 mg/dL 0.78 0.77 0.65  Sodium 135 - 145 mmol/L 136 136 135  Potassium 3.5 - 5.1 mmol/L 4.1 3.9 3.6  Chloride 98 - 111 mmol/L 104 104 104  CO2 22 - 32 mmol/L 24 23 26   Calcium 8.9 - 10.3 mg/dL 9.0 9.7 9.4  Total Protein 6.5 - 8.1 g/dL 7.7 8.3(H) 8.3(H)  Total Bilirubin 0.3 - 1.2 mg/dL 0.5 0.9 1.1  Alkaline  Phos 38 - 126 U/L 74 75 52  AST 15 - 41 U/L 265(HH) 308(HH) 157(H)  ALT 0 - 44 U/L 199(H) 316(HH) 150(H)      RADIOGRAPHIC STUDIES: I have personally reviewed the radiological images as listed and agreed with the findings in the report. No results found.    Orders Placed This Encounter  Procedures   CT ABDOMEN PELVIS W CONTRAST    Standing Status:   Future    Standing Expiration Date:   08/31/2022    Order Specific Question:   If indicated for the ordered procedure, I authorize the administration of contrast media per Radiology protocol    Answer:   Yes    Order Specific Question:   Preferred imaging location?    Answer:   [facility]    Order Specific Question:   Release to patient    Answer:   Immediate    Order Specific Question:   Is Oral Contrast requested for this exam?    Answer:   Yes, Per Radiology protocol   All questions were answered. The patient knows to call the clinic with any problems, questions or concerns. No barriers to learning was detected. The total time spent in the appointment was 30 minutes.     [name], MD 08/31/2021   I, Wilburn Mylar, am acting as scribe for [name], MD.   I have reviewed the above documentation for accuracy and completeness, and I agree with the above.

## 2021-09-01 LAB — AFP TUMOR MARKER: AFP, Serum, Tumor Marker: 13.9 ng/mL — ABNORMAL HIGH (ref 0.0–8.4)

## 2021-09-16 ENCOUNTER — Encounter: Payer: Self-pay | Admitting: Gastroenterology

## 2021-09-21 ENCOUNTER — Inpatient Hospital Stay: Payer: Medicare Other | Admitting: Dietician

## 2021-09-21 ENCOUNTER — Inpatient Hospital Stay (HOSPITAL_BASED_OUTPATIENT_CLINIC_OR_DEPARTMENT_OTHER): Payer: Medicare Other | Admitting: Hematology

## 2021-09-21 ENCOUNTER — Other Ambulatory Visit: Payer: Self-pay

## 2021-09-21 ENCOUNTER — Inpatient Hospital Stay: Payer: Medicare Other

## 2021-09-21 ENCOUNTER — Inpatient Hospital Stay: Payer: Medicare Other | Attending: Hematology

## 2021-09-21 VITALS — BP 157/87 | HR 88 | Temp 98.0°F | Resp 16 | Wt 125.6 lb

## 2021-09-21 DIAGNOSIS — R634 Abnormal weight loss: Secondary | ICD-10-CM | POA: Diagnosis not present

## 2021-09-21 DIAGNOSIS — C220 Liver cell carcinoma: Secondary | ICD-10-CM

## 2021-09-21 DIAGNOSIS — N200 Calculus of kidney: Secondary | ICD-10-CM | POA: Insufficient documentation

## 2021-09-21 DIAGNOSIS — C221 Intrahepatic bile duct carcinoma: Secondary | ICD-10-CM | POA: Insufficient documentation

## 2021-09-21 DIAGNOSIS — Z79899 Other long term (current) drug therapy: Secondary | ICD-10-CM | POA: Insufficient documentation

## 2021-09-21 DIAGNOSIS — I2510 Atherosclerotic heart disease of native coronary artery without angina pectoris: Secondary | ICD-10-CM | POA: Insufficient documentation

## 2021-09-21 DIAGNOSIS — Z5112 Encounter for antineoplastic immunotherapy: Secondary | ICD-10-CM | POA: Insufficient documentation

## 2021-09-21 DIAGNOSIS — I700 Atherosclerosis of aorta: Secondary | ICD-10-CM | POA: Insufficient documentation

## 2021-09-21 LAB — CBC WITH DIFFERENTIAL (CANCER CENTER ONLY)
Abs Immature Granulocytes: 0 10*3/uL (ref 0.00–0.07)
Basophils Absolute: 0 10*3/uL (ref 0.0–0.1)
Basophils Relative: 0 %
Eosinophils Absolute: 0.1 10*3/uL (ref 0.0–0.5)
Eosinophils Relative: 2 %
HCT: 39.4 % (ref 39.0–52.0)
Hemoglobin: 13.5 g/dL (ref 13.0–17.0)
Immature Granulocytes: 0 %
Lymphocytes Relative: 25 %
Lymphs Abs: 0.8 10*3/uL (ref 0.7–4.0)
MCH: 31.4 pg (ref 26.0–34.0)
MCHC: 34.3 g/dL (ref 30.0–36.0)
MCV: 91.6 fL (ref 80.0–100.0)
Monocytes Absolute: 0.7 10*3/uL (ref 0.1–1.0)
Monocytes Relative: 22 %
Neutro Abs: 1.7 10*3/uL (ref 1.7–7.7)
Neutrophils Relative %: 51 %
Platelet Count: 119 10*3/uL — ABNORMAL LOW (ref 150–400)
RBC: 4.3 MIL/uL (ref 4.22–5.81)
RDW: 12.8 % (ref 11.5–15.5)
WBC Count: 3.3 10*3/uL — ABNORMAL LOW (ref 4.0–10.5)
nRBC: 0 % (ref 0.0–0.2)

## 2021-09-21 LAB — CMP (CANCER CENTER ONLY)
ALT: 99 U/L — ABNORMAL HIGH (ref 0–44)
AST: 123 U/L — ABNORMAL HIGH (ref 15–41)
Albumin: 3.3 g/dL — ABNORMAL LOW (ref 3.5–5.0)
Alkaline Phosphatase: 68 U/L (ref 38–126)
Anion gap: 10 (ref 5–15)
BUN: 15 mg/dL (ref 8–23)
CO2: 20 mmol/L — ABNORMAL LOW (ref 22–32)
Calcium: 9.1 mg/dL (ref 8.9–10.3)
Chloride: 106 mmol/L (ref 98–111)
Creatinine: 0.76 mg/dL (ref 0.61–1.24)
GFR, Estimated: >60 mL/min (ref >60)
Glucose, Bld: 91 mg/dL (ref 70–99)
Potassium: 4 mmol/L (ref 3.5–5.1)
Sodium: 136 mmol/L (ref 135–145)
Total Bilirubin: 0.8 mg/dL (ref 0.3–1.2)
Total Protein: 7.9 g/dL (ref 6.5–8.1)

## 2021-09-21 LAB — TOTAL PROTEIN, URINE DIPSTICK: Protein, ur: NEGATIVE mg/dL

## 2021-09-21 MED ORDER — SODIUM CHLORIDE 0.9 % IV SOLN
15.0000 mg/kg | Freq: Once | INTRAVENOUS | Status: AC
  Administered 2021-09-21: 900 mg via INTRAVENOUS
  Filled 2021-09-21: qty 32

## 2021-09-21 MED ORDER — HYDROCODONE-ACETAMINOPHEN 5-325 MG PO TABS
1.0000 | ORAL_TABLET | Freq: Three times a day (TID) | ORAL | 0 refills | Status: DC | PRN
Start: 2021-09-21 — End: 2021-11-19

## 2021-09-21 MED ORDER — SODIUM CHLORIDE 0.9 % IV SOLN
1200.0000 mg | Freq: Once | INTRAVENOUS | Status: AC
  Administered 2021-09-21: 1200 mg via INTRAVENOUS
  Filled 2021-09-21: qty 20

## 2021-09-21 MED ORDER — SODIUM CHLORIDE 0.9 % IV SOLN: INTRAVENOUS | Status: AC

## 2021-09-21 MED ORDER — SODIUM CHLORIDE 0.9 % IV SOLN: Freq: Once | INTRAVENOUS | Status: AC

## 2021-09-21 NOTE — Progress Notes (Signed)
Shawn Bailey   Telephone:(336) [phone] Fax:(336) [phone]   Clinic Follow up Note   Patient Care Team: Pa, [facility] as PCP - General (Family Medicine) [name], MD as Consulting Physician (Hematology and Oncology) [name], RN (Inactive) as Oncology Nurse Navigator  Date of Service:  09/21/2021  CHIEF COMPLAINT: f/u of liver cancer  CURRENT THERAPY:  -Atezolizumab and bevacizumab q 3 weeks -Y90 on 07/01/21 and 07/22/21  ASSESSMENT & PLAN:  Shawn Bailey is a 75 y.o. male with   1.  Multifocal hepatocellular carcinoma, cT3N0M, stage IIIA -Liver MRI 03/06/21 showed a large 9.5 cm and a 1.3 cm liver mass is seen in right lobe, biopsy 03/08/21 confirmed moderately differentiated hepatocellular carcinoma.  No nodal or distant metastasis on CT 03/05/21. -This is likely related to his HCV infection -He is not a candidate for liver resection or transplant. Dr. [name] at [facility] did not recommend surgery given the location and extent of disease. -He started atezolizumab and bevacizumab on 04/28/21, while waiting for liver radioembolization. He tolerates this well with mild fatigue. He overall feels better after started treatment  -His AFP dropped significantly which likely indicating good response  -he underwent Y90 on 07/22/21. They may consider a second procedure after restaging scan in 10/2021. -his first AFP s/p second Y90 on 10/31/20 was down to 13.9. -labs pending today. We will continue Beva and Tecentriq every 3 weeks -Plan to repeat a CT abdomen pelvis with contrast before next treatment   2. Symptom management: Weight loss, decreased appetite, fatigue, pain -Secondary to liver cancer -he has diminished appetite and taste changes, causing weight loss. He is supplementing with 2-3 Ensure a day. We will supply him with another case today. -He will continue follow with our nutritionist as needed -he reports using Norco up to two times a day,  usually only once at night. I refilled for him today.   3. Untreated HCV  -During 02/2021 Hospitalization, labs showed HIV non reactive and hepatitis panel hep C ab positive, he was not aware that he has HCV infection -due to his recent [facility] diagnosis, will hold off HCV treatment for now -he does not see any doctors regularly. I previously referred him to the liver clinic for evaluation of his cancer, HCV, and for endoscopy when needed. This has not yet been scheduled.     PLAN: -proceed with atezolizumab and Beva today  -I refilled his Norco -lab and CT several in 2-3 weeks, prior to next treatment -f/u and atezolizumab/Beva in 3 weeks   No problem-specific Assessment & Plan notes found for this encounter.   SUMMARY OF ONCOLOGIC HISTORY: Oncology History Overview Note  Cancer Staging Hepatocellular carcinoma [facility]) Staging form: Liver, AJCC 8th Edition - Clinical stage from 03/08/2021: Stage IIIA (cT3, cN0, cM0) - Signed by [name], MD on 03/11/2021 Stage prefix: Initial diagnosis Histologic grade (G): G2 Histologic grading system: 4 grade system     Hepatocellular carcinoma ([place])   Initial Diagnosis   Liver mass, right lobe   03/05/2021 Imaging   CT CAP  IMPRESSION: 1. Right hepatic lobe masses which are suspicious for either metastatic disease or primary malignancy such as hepatocellular carcinoma. These are suboptimally evaluated on this noncontrast exam. Consider multidisciplinary oncology consultation with potential clinical strategies of tissue sampling versus PET to direct sampling. 2. No primary malignancy identified within the chest, abdomen, or pelvis, given limitation of noncontrast exam. 3. Left nephrolithiasis. 4. Coronary artery atherosclerosis. Aortic Atherosclerosis (ICD10-I70.0). 5. Esophageal  air fluid level suggests dysmotility or gastroesophageal reflux.   03/06/2021 Imaging   MRI Liver  IMPRESSION: 9.5 cm heterogeneous hypovascular mass in  anterior right hepatic lobe, and 1.3 cm hypervascular mass in the posterior right hepatic lobe. Differential diagnosis includes liver metastases and multifocal hepatocellular carcinoma. Suggest correlation with serum AFP level, and possible tissue sampling.   No evidence of abdominal metastatic disease.   03/08/2021 Initial Biopsy   A. LIVER, RIGHT, BIOPSY:  - Hepatocellular carcinoma.   COMMENT:  The hepatocellular carcinoma is moderately differentiated and associated with areas of necrosis.  Immunohistochemistry shows positivity with glypican-3 and weak staining with HepPar 1.  The tumor is negative with arginase 1, alpha-fetoprotein, CDX2, cytokeratin 7, cytokeratin 20, cytokeratin AE1/AE3, PSA, prostein and MOC31.  Polyclonal CEA shows focal canalicular pattern.  The morphology and immunophenotype are consistent with hepatocellular carcinoma.    03/08/2021 Cancer Staging   Staging form: Liver, AJCC 8th Edition - Clinical stage from 03/08/2021: Stage IIIA (cT3, cN0, cM0) - Signed by [name], MD on 03/11/2021 Stage prefix: Initial diagnosis Histologic grade (G): G2 Histologic grading system: 4 grade system    04/28/2021 -  Chemotherapy   Patient is on Treatment Plan : LUNG Atezolizumab + Bevacizumab q21d Maintenance     06/16/2021 Imaging   CT Abdomen  IMPRESSION: Marked interval decrease in size of the large RIGHT hepatic lobe lesion, biopsy-proven hepatocellular carcinoma. No arterial phase enhancement on early phase, vague enhancement on later phase greater than 20 Hounsfield unit enhancement on venous phase. Less pronounced arterial enhancement than on previous imaging with small peripheral foci of enhancement on arterial phase particularly along the margin of the tumor near the dome of the liver (image 12/2) 11 mm focus of enhancement in this area.   Interval response to therapy of a small lesion previously compatible with LI-RADS category 5 lesion in the posterior RIGHT hepatic  lobe.   No new hepatic lesion.   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   Signs of nephrolithiasis calculus on the LEFT measuring approximately 7 mm.   Colonic diverticulosis without evidence of acute diverticulitis.   Aortic Atherosclerosis (ICD10-I70.0).      INTERVAL HISTORY:  Shawn Bailey is here for a follow up of liver cancer. He was last seen by me on 08/31/21. He presents to the clinic accompanied by his ex-wife and her husband. She points out that he lost weight. He reports his appetite will decrease after treatment but will return. He endorses supplementing with Ensure 2-3 times a day. He also reports he still has pain and uses the hydrocodone once a day at bedtime.   All other systems were reviewed with the patient and are negative.  MEDICAL HISTORY:  Past Medical History:  Diagnosis Date   Depression    Hepatocellular carcinoma ([place]) 03/08/2021   Parasite infestation     SURGICAL HISTORY: Past Surgical History:  Procedure Laterality Date   IR ANGIOGRAM SELECTIVE EACH ADDITIONAL VESSEL  07/01/2021   IR ANGIOGRAM SELECTIVE EACH ADDITIONAL VESSEL  07/22/2021   IR ANGIOGRAM SELECTIVE EACH ADDITIONAL VESSEL  07/22/2021   IR ANGIOGRAM SELECTIVE EACH ADDITIONAL VESSEL  07/22/2021   IR ANGIOGRAM SELECTIVE EACH ADDITIONAL VESSEL  07/22/2021   IR ANGIOGRAM VISCERAL SELECTIVE  07/01/2021   IR ANGIOGRAM VISCERAL SELECTIVE  07/22/2021   IR EMBO ARTERIAL NOT HEMORR HEMANG INC GUIDE ROADMAPPING  07/01/2021   IR EMBO TUMOR ORGAN ISCHEMIA INFARCT INC GUIDE ROADMAPPING  07/22/2021   IR RADIOLOGIST EVAL &  MGMT  06/01/2021   IR US GUIDE VASC ACCESS RIGHT  07/01/2021   IR US GUIDE VASC ACCESS RIGHT  07/22/2021   NO PAST SURGERIES      I have reviewed the social history and family history with the patient and they are unchanged from previous note.  ALLERGIES:  has No Known Allergies.  MEDICATIONS:  Current Outpatient Medications  Medication Sig  Dispense Refill   Ascorbic Acid (VITAMIN C) 100 MG tablet Take 100 mg by mouth daily.     HYDROcodone-acetaminophen (NORCO) 5-325 MG tablet Take 1 tablet by mouth every 8 (eight) hours as needed for moderate pain. 60 tablet 0   Multiple Vitamin (MULTIVITAMIN WITH MINERALS) TABS tablet Take 1 tablet by mouth daily.     ondansetron (ZOFRAN) 8 MG tablet Take 1 tablet (8 mg total) by mouth every 8 (eight) hours as needed for nausea or vomiting. 30 tablet 0   polyethylene glycol (MIRALAX / GLYCOLAX) 17 g packet Take 17 g by mouth daily as needed for mild constipation. 14 each 0   prochlorperazine (COMPAZINE) 10 MG tablet Take 1 tablet (10 mg total) by mouth every 6 (six) hours as needed for nausea or vomiting. 30 tablet 0   senna (SENOKOT) 8.6 MG TABS tablet Take 1 tablet (8.6 mg total) by mouth 2 (two) times daily. 120 tablet 0   vitamin B-12 (CYANOCOBALAMIN) 100 MCG tablet Take 100 mcg by mouth daily.     No current facility-administered medications for this visit.   Facility-Administered Medications Ordered in Other Visits  Medication Dose Route Frequency Provider Last Rate Last Admin   0.9 %  sodium chloride infusion   Intravenous Once [name], MD       0.9 %  sodium chloride infusion   Intravenous Once [name], MD       atezolizumab [facility]) 1,200 mg in sodium chloride 0.9 % 250 mL chemo infusion  1,200 mg Intravenous Once [name], MD       bevacizumab-bvzr (ZIRABEV) 900 mg in sodium chloride 0.9 % 100 mL chemo infusion  15 mg/kg (Treatment Plan Recorded) Intravenous Once [name], MD        PHYSICAL EXAMINATION: ECOG PERFORMANCE STATUS: 1 - Symptomatic but completely ambulatory  Vitals:   09/21/21 0956  BP: (!) 157/87  Pulse: 88  Resp: 16  Temp: 98 F (36.7 C)  SpO2: 96%   Wt Readings from Last 3 Encounters:  09/21/21 125 lb 9 oz (57 kg)  08/31/21 128 lb 9.6 oz (58.3 kg)  08/11/21 128 lb 6.4 oz (58.2 kg)     GENERAL:alert, no distress and comfortable SKIN: skin color  normal, no rashes or significant lesions EYES: normal, Conjunctiva are pink and non-injected, sclera clear  NEURO: alert & oriented x 3 with fluent speech  LABORATORY DATA:  I have reviewed the data as listed CBC Latest Ref Rng & Units 09/21/2021 08/31/2021 08/11/2021  WBC 4.0 - 10.5 K/uL 3.3(L) 3.2(L) 3.6(L)  Hemoglobin 13.0 - 17.0 g/dL 13.5 13.2 13.8  Hematocrit 39.0 - 52.0 % 39.4 38.8(L) 39.4  Platelets 150 - 400 K/uL 119(L) 103(L) 122(L)     CMP Latest Ref Rng & Units 09/21/2021 08/31/2021 08/11/2021  Glucose 70 - 99 mg/dL 91 83 76  BUN 8 - 23 mg/dL 15 16 15   Creatinine 0.61 - 1.24 mg/dL 0.76 0.78 0.77  Sodium 135 - 145 mmol/L 136 136 136  Potassium 3.5 - 5.1 mmol/L 4.0 4.1 3.9  Chloride 98 - 111 mmol/L 106  104 104  CO2 22 - 32 mmol/L 20(L) 24 23  Calcium 8.9 - 10.3 mg/dL 9.1 9.0 9.7  Total Protein 6.5 - 8.1 g/dL 7.9 7.7 8.3(H)  Total Bilirubin 0.3 - 1.2 mg/dL 0.8 0.5 0.9  Alkaline Phos 38 - 126 U/L 68 74 75  AST 15 - 41 U/L 123(H) 265(HH) 308(HH)  ALT 0 - 44 U/L 99(H) 199(H) 316(HH)      RADIOGRAPHIC STUDIES: I have personally reviewed the radiological images as listed and agreed with the findings in the report. No results found.    No orders of the defined types were placed in this encounter.  All questions were answered. The patient knows to call the clinic with any problems, questions or concerns. No barriers to learning was detected. The total time spent in the appointment was 30 minutes.     [name], MD 09/21/2021   I, Wilburn Mylar, am acting as scribe for [name], MD.   I have reviewed the above documentation for accuracy and completeness, and I agree with the above.

## 2021-09-21 NOTE — Patient Instructions (Signed)
[facility]  Discharge Instructions: Thank you for choosing [facility] to provide your oncology and hematology care.   If you have a lab appointment with the [facility], please go directly to the [facility] and check in at the registration area.   Wear comfortable clothing and clothing appropriate for easy access to any Portacath or PICC line.   We strive to give you quality time with your provider. You may need to reschedule your appointment if you arrive late (15 or more minutes).  Arriving late affects you and other patients whose appointments are after yours.  Also, if you miss three or more appointments without notifying the office, you may be dismissed from the clinic at the provider's discretion.      For prescription refill requests, have your pharmacy contact our office and allow 72 hours for refills to be completed.    Today you received the following chemotherapy and/or immunotherapy agents Tecentriq and Noah Charon      To help prevent nausea and vomiting after your treatment, we encourage you to take your nausea medication as directed.  BELOW ARE SYMPTOMS THAT SHOULD BE REPORTED IMMEDIATELY: *FEVER GREATER THAN 100.4 F (38 C) OR HIGHER *CHILLS OR SWEATING *NAUSEA AND VOMITING THAT IS NOT CONTROLLED WITH YOUR NAUSEA MEDICATION *UNUSUAL SHORTNESS OF BREATH *UNUSUAL BRUISING OR BLEEDING *URINARY PROBLEMS (pain or burning when urinating, or frequent urination) *BOWEL PROBLEMS (unusual diarrhea, constipation, pain near the anus) TENDERNESS IN MOUTH AND THROAT WITH OR WITHOUT PRESENCE OF ULCERS (sore throat, sores in mouth, or a toothache) UNUSUAL RASH, SWELLING OR PAIN  UNUSUAL VAGINAL DISCHARGE OR ITCHING   Items with * indicate a potential emergency and should be followed up as soon as possible or go to the Emergency Department if any problems should occur.  Please show the CHEMOTHERAPY ALERT CARD or IMMUNOTHERAPY ALERT CARD at  check-in to the Emergency Department and triage nurse.  Should you have questions after your visit or need to cancel or reschedule your appointment, please contact [facility]  Dept: [phone]  and follow the prompts.  Office hours are 8:00 a.m. to 4:30 p.m. Monday - Friday. Please note that voicemails left after 4:00 p.m. may not be returned until the following business day.  We are closed weekends and major holidays. You have access to a nurse at all times for urgent questions. Please call the main number to the clinic Dept: [phone] and follow the prompts.   For any non-urgent questions, you may also contact your provider using MyChart. We now offer e-Visits for anyone 32 and older to request care online for non-urgent symptoms. For details visit [email or web address].   Also download the MyChart app! Go to the app store, search "MyChart", open the app, select [facility], and log in with your MyChart username and password.  Due to Covid, a mask is required upon entering the hospital/clinic. If you do not have a mask, one will be given to you upon arrival. For doctor visits, patients may have 1 support person aged 64 or older with them. For treatment visits, patients cannot have anyone with them due to current Covid guidelines and our immunocompromised population.

## 2021-09-21 NOTE — Progress Notes (Signed)
Nutrition Follow-up:  Patient receiving Tecentriq and Zirabev for hepatocellular cancer. He is s/p Y90 on 9/15 and 10/6.   Met with patient during infusion. He reports diminished appetite lasting 2-3 days after treatment. Patient reports he makes himself eat, but unable to eat as much. Patient reports appetite has been good the past few days, he is eating a variety of foods and good sources of protein (Eggs, chicken noodle soup, fish, mac/cheese, potatoes, pepper steak, rice, egg roll, key lime pie, ice cream). Patient is drinking 3 Ensure day. Reports having 4 left at home and is requesting another case today. He denies constipation, diarrhea, nausea, vomiting.   Medications: reviewed  Labs: reviewed   Anthropometrics: Weight 125 lb 9 oz today decreased from 128 lb 9.6 oz on 11/15  10/26 - 128 lb 6.4 oz 10/5 - 129 lb 11.2 oz    NUTRITION DIAGNOSIS: Food and nutrition knowledge related deficit    INTERVENTION:  Continue strategies for increasing calories and protein with small frequent meals and snacks Encouraged eating bedtime snack Continue drinking 3-4 Ensure Plus/equivalent daily  One complimentary case of Ensure Enlive provided today Sample of Ensure Complete given for patient to drink during treatment today Patient has contact information     MONITORING, EVALUATION, GOAL: weight trends, intake   NEXT VISIT: Monday January 16 during infusion

## 2021-09-30 ENCOUNTER — Ambulatory Visit: Payer: Medicare Other | Admitting: Gastroenterology

## 2021-10-05 ENCOUNTER — Ambulatory Visit (HOSPITAL_COMMUNITY): Payer: Medicare Other

## 2021-10-13 ENCOUNTER — Encounter: Payer: Self-pay | Admitting: Nutrition

## 2021-10-13 ENCOUNTER — Inpatient Hospital Stay (HOSPITAL_BASED_OUTPATIENT_CLINIC_OR_DEPARTMENT_OTHER): Payer: Medicare Other | Admitting: Hematology

## 2021-10-13 ENCOUNTER — Other Ambulatory Visit: Payer: Self-pay

## 2021-10-13 ENCOUNTER — Inpatient Hospital Stay: Payer: Medicare Other

## 2021-10-13 VITALS — BP 139/83 | HR 69 | Temp 98.7°F | Resp 17 | Ht 65.0 in | Wt 126.8 lb

## 2021-10-13 DIAGNOSIS — C220 Liver cell carcinoma: Secondary | ICD-10-CM | POA: Diagnosis not present

## 2021-10-13 DIAGNOSIS — Z5112 Encounter for antineoplastic immunotherapy: Secondary | ICD-10-CM | POA: Diagnosis not present

## 2021-10-13 LAB — CMP (CANCER CENTER ONLY)
ALT: 112 U/L — ABNORMAL HIGH (ref 0–44)
AST: 130 U/L — ABNORMAL HIGH (ref 15–41)
Albumin: 3.6 g/dL (ref 3.5–5.0)
Alkaline Phosphatase: 59 U/L (ref 38–126)
Anion gap: 7 (ref 5–15)
BUN: 14 mg/dL (ref 8–23)
CO2: 23 mmol/L (ref 22–32)
Calcium: 9.6 mg/dL (ref 8.9–10.3)
Chloride: 105 mmol/L (ref 98–111)
Creatinine: 0.71 mg/dL (ref 0.61–1.24)
GFR, Estimated: >60 mL/min (ref >60)
Glucose, Bld: 93 mg/dL (ref 70–99)
Potassium: 4.1 mmol/L (ref 3.5–5.1)
Sodium: 135 mmol/L (ref 135–145)
Total Bilirubin: 0.8 mg/dL (ref 0.3–1.2)
Total Protein: 7.8 g/dL (ref 6.5–8.1)

## 2021-10-13 LAB — CBC WITH DIFFERENTIAL (CANCER CENTER ONLY)
Abs Immature Granulocytes: 0.01 10*3/uL (ref 0.00–0.07)
Basophils Absolute: 0 10*3/uL (ref 0.0–0.1)
Basophils Relative: 0 %
Eosinophils Absolute: 0.1 10*3/uL (ref 0.0–0.5)
Eosinophils Relative: 3 %
HCT: 40.7 % (ref 39.0–52.0)
Hemoglobin: 13.7 g/dL (ref 13.0–17.0)
Immature Granulocytes: 0 %
Lymphocytes Relative: 33 %
Lymphs Abs: 1.1 10*3/uL (ref 0.7–4.0)
MCH: 30.9 pg (ref 26.0–34.0)
MCHC: 33.7 g/dL (ref 30.0–36.0)
MCV: 91.9 fL (ref 80.0–100.0)
Monocytes Absolute: 0.5 10*3/uL (ref 0.1–1.0)
Monocytes Relative: 17 %
Neutro Abs: 1.5 10*3/uL — ABNORMAL LOW (ref 1.7–7.7)
Neutrophils Relative %: 47 %
Platelet Count: 92 10*3/uL — ABNORMAL LOW (ref 150–400)
RBC: 4.43 MIL/uL (ref 4.22–5.81)
RDW: 12.6 % (ref 11.5–15.5)
WBC Count: 3.2 10*3/uL — ABNORMAL LOW (ref 4.0–10.5)
nRBC: 0 % (ref 0.0–0.2)

## 2021-10-13 MED ORDER — SODIUM CHLORIDE 0.9 % IV SOLN: Freq: Once | INTRAVENOUS | Status: AC

## 2021-10-13 MED ORDER — SODIUM CHLORIDE 0.9 % IV SOLN
15.0000 mg/kg | Freq: Once | INTRAVENOUS | Status: AC
  Administered 2021-10-13: 14:00:00 900 mg via INTRAVENOUS
  Filled 2021-10-13: qty 32

## 2021-10-13 MED ORDER — SODIUM CHLORIDE 0.9 % IV SOLN
1200.0000 mg | Freq: Once | INTRAVENOUS | Status: AC
  Administered 2021-10-13: 14:00:00 1200 mg via INTRAVENOUS
  Filled 2021-10-13: qty 20

## 2021-10-13 NOTE — Progress Notes (Signed)
Provided one complimentary case of ensure plus. 

## 2021-10-13 NOTE — Patient Instructions (Signed)
[facility]  Discharge Instructions: Thank you for choosing [facility] to provide your oncology and hematology care.   If you have a lab appointment with the [facility], please go directly to the [facility] and check in at the registration area.   Wear comfortable clothing and clothing appropriate for easy access to any Portacath or PICC line.   We strive to give you quality time with your provider. You may need to reschedule your appointment if you arrive late (15 or more minutes).  Arriving late affects you and other patients whose appointments are after yours.  Also, if you miss three or more appointments without notifying the office, you may be dismissed from the clinic at the providers discretion.      For prescription refill requests, have your pharmacy contact our office and allow 72 hours for refills to be completed.    Today you received the following chemotherapy and/or immunotherapy agents Tecentriq and Noah Charon      To help prevent nausea and vomiting after your treatment, we encourage you to take your nausea medication as directed.  BELOW ARE SYMPTOMS THAT SHOULD BE REPORTED IMMEDIATELY: *FEVER GREATER THAN 100.4 F (38 C) OR HIGHER *CHILLS OR SWEATING *NAUSEA AND VOMITING THAT IS NOT CONTROLLED WITH YOUR NAUSEA MEDICATION *UNUSUAL SHORTNESS OF BREATH *UNUSUAL BRUISING OR BLEEDING *URINARY PROBLEMS (pain or burning when urinating, or frequent urination) *BOWEL PROBLEMS (unusual diarrhea, constipation, pain near the anus) TENDERNESS IN MOUTH AND THROAT WITH OR WITHOUT PRESENCE OF ULCERS (sore throat, sores in mouth, or a toothache) UNUSUAL RASH, SWELLING OR PAIN  UNUSUAL VAGINAL DISCHARGE OR ITCHING   Items with * indicate a potential emergency and should be followed up as soon as possible or go to the Emergency Department if any problems should occur.  Please show the CHEMOTHERAPY ALERT CARD or IMMUNOTHERAPY ALERT CARD at  check-in to the Emergency Department and triage nurse.  Should you have questions after your visit or need to cancel or reschedule your appointment, please contact [facility]  Dept: [phone]  and follow the prompts.  Office hours are 8:00 a.m. to 4:30 p.m. Monday - Friday. Please note that voicemails left after 4:00 p.m. may not be returned until the following business day.  We are closed weekends and major holidays. You have access to a nurse at all times for urgent questions. Please call the main number to the clinic Dept: [phone] and follow the prompts.   For any non-urgent questions, you may also contact your provider using MyChart. We now offer e-Visits for anyone 69 and older to request care online for non-urgent symptoms. For details visit [email or web address].   Also download the MyChart app! Go to the app store, search "MyChart", open the app, select [facility], and log in with your MyChart username and password.  Due to Covid, a mask is required upon entering the hospital/clinic. If you do not have a mask, one will be given to you upon arrival. For doctor visits, patients may have 1 support person aged 35 or older with them. For treatment visits, patients cannot have anyone with them due to current Covid guidelines and our immunocompromised population.

## 2021-10-13 NOTE — Progress Notes (Signed)
[facility]   [phone] [phone]   Clinic Follow up Note   Patient Care Team: Pa, [facility] as PCP - General (Family Medicine) [name], MD as Consulting Physician (Hematology and Oncology) [name], RN (Inactive) as Oncology Nurse Navigator  Date of Service:  10/13/2021  CHIEF COMPLAINT: f/u of liver cancer  CURRENT THERAPY:  -Atezolizumab and bevacizumab q 3 weeks -Y90 on 07/01/21 and 07/22/21  ASSESSMENT & PLAN:  Shawn Bailey is a 75 y.o. male with   1.  Multifocal hepatocellular carcinoma, cT3N0M, stage IIIA -Liver MRI 03/06/21 showed a large 9.5 cm and a 1.3 cm liver mass is seen in right lobe, biopsy 03/08/21 confirmed moderately differentiated hepatocellular carcinoma.  No nodal or distant metastasis on CT 03/05/21. -This is likely related to his HCV infection -He is not a candidate for liver resection or transplant. Dr. [name] at [facility] did not recommend surgery given the location and extent of disease. -He started atezolizumab and bevacizumab on 04/28/21, while waiting for liver radioembolization. He tolerates this well with mild fatigue. He overall feels better after started treatment  -His AFP dropped significantly which likely indicating good response  -he underwent Y90 on 07/22/21. They may consider a second procedure after restaging scan in 10/2021. -his first AFP s/p second Y90 on 08/31/21 was down to 13.9. Repeat today is pending. -labs reviewed today, liver enzymes stable, adequate to proceed with treatment. We will continue Beva and Tecentriq every 3 weeks -he is scheduled for CT AP tomorrow, 12/29. I will call him with the results next week.   2. Symptom management: Weight loss, decreased appetite, fatigue, pain -Secondary to liver cancer -he has diminished appetite and taste changes, causing weight loss. He is supplementing with 2-3 Ensure a day. We will supply him with another case today. -He will  continue follow with our nutritionist as needed, next f/u on 11/01/21 -he reports using Norco up to two times a day, usually only once at night.   3. Untreated HCV  -During 02/2021 Hospitalization, labs showed HIV non reactive and hepatitis panel hep C ab positive, he was not aware that he has HCV infection -due to his recent [facility] diagnosis, will hold off HCV treatment for now -he does not see any doctors regularly. I previously referred him to the liver clinic for evaluation of his cancer, HCV, and for endoscopy when needed. This has not yet been scheduled.     PLAN: -proceed with atezolizumab and Beva today  -CT tomorrow, 12/29  -I will call with the results -consult with GI on 10/26/21 -f/u and atezolizumab/Beva in 3 weeks   No problem-specific Assessment & Plan notes found for this encounter.   SUMMARY OF ONCOLOGIC HISTORY: Oncology History Overview Note  Cancer Staging Hepatocellular carcinoma [facility]) Staging form: Liver, AJCC 8th Edition - Clinical stage from 03/08/2021: Stage IIIA (cT3, cN0, cM0) - Signed by [name], MD on 03/11/2021 Stage prefix: Initial diagnosis Histologic grade (G): G2 Histologic grading system: 4 grade system     Hepatocellular carcinoma ([place])   Initial Diagnosis   Liver mass, right lobe   03/05/2021 Imaging   CT CAP  IMPRESSION: 1. Right hepatic lobe masses which are suspicious for either metastatic disease or primary malignancy such as hepatocellular carcinoma. These are suboptimally evaluated on this noncontrast exam. Consider multidisciplinary oncology consultation with potential clinical strategies of tissue sampling versus PET to direct sampling. 2. No primary malignancy identified within the chest, abdomen, or pelvis, given  limitation of noncontrast exam. 3. Left nephrolithiasis. 4. Coronary artery atherosclerosis. Aortic Atherosclerosis (ICD10-I70.0). 5. Esophageal air fluid level suggests dysmotility or gastroesophageal reflux.    03/06/2021 Imaging   MRI Liver  IMPRESSION: 9.5 cm heterogeneous hypovascular mass in anterior right hepatic lobe, and 1.3 cm hypervascular mass in the posterior right hepatic lobe. Differential diagnosis includes liver metastases and multifocal hepatocellular carcinoma. Suggest correlation with serum AFP level, and possible tissue sampling.   No evidence of abdominal metastatic disease.   03/08/2021 Initial Biopsy   A. LIVER, RIGHT, BIOPSY:  - Hepatocellular carcinoma.   COMMENT:  The hepatocellular carcinoma is moderately differentiated and associated with areas of necrosis.  Immunohistochemistry shows positivity with glypican-3 and weak staining with HepPar 1.  The tumor is negative with arginase 1, alpha-fetoprotein, CDX2, cytokeratin 7, cytokeratin 20, cytokeratin AE1/AE3, PSA, prostein and MOC31.  Polyclonal CEA shows focal canalicular pattern.  The morphology and immunophenotype are consistent with hepatocellular carcinoma.    03/08/2021 Cancer Staging   Staging form: Liver, AJCC 8th Edition - Clinical stage from 03/08/2021: Stage IIIA (cT3, cN0, cM0) - Signed by [name], MD on 03/11/2021 Stage prefix: Initial diagnosis Histologic grade (G): G2 Histologic grading system: 4 grade system    04/28/2021 -  Chemotherapy   Patient is on Treatment Plan : LUNG Atezolizumab + Bevacizumab q21d Maintenance     06/16/2021 Imaging   CT Abdomen  IMPRESSION: Marked interval decrease in size of the large RIGHT hepatic lobe lesion, biopsy-proven hepatocellular carcinoma. No arterial phase enhancement on early phase, vague enhancement on later phase greater than 20 Hounsfield unit enhancement on venous phase. Less pronounced arterial enhancement than on previous imaging with small peripheral foci of enhancement on arterial phase particularly along the margin of the tumor near the dome of the liver (image 12/2) 11 mm focus of enhancement in this area.   Interval response to therapy of a small  lesion previously compatible with LI-RADS category 5 lesion in the posterior RIGHT hepatic lobe.   No new hepatic lesion.   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   Signs of nephrolithiasis calculus on the LEFT measuring approximately 7 mm.   Colonic diverticulosis without evidence of acute diverticulitis.   Aortic Atherosclerosis (ICD10-I70.0).      INTERVAL HISTORY:  Sunny Gains is here for a follow up of liver cancer. He was last seen by me on 09/21/21. He presents to the clinic accompanied by his ex-wife and her husband. He reports he is having pain in his left shoulder since his Y90, when he had to have his arm raised during the procedure. He has tenderness to the bicep area. He also reports an intermittent burning that occurs when he drinks something cold. He notes he does not require pain medicine for this.   All other systems were reviewed with the patient and are negative.  MEDICAL HISTORY:  Past Medical History:  Diagnosis Date   Depression    Hepatocellular carcinoma ([place]) 03/08/2021   Parasite infestation     SURGICAL HISTORY: Past Surgical History:  Procedure Laterality Date   IR ANGIOGRAM SELECTIVE EACH ADDITIONAL VESSEL  07/01/2021   IR ANGIOGRAM SELECTIVE EACH ADDITIONAL VESSEL  07/22/2021   IR ANGIOGRAM SELECTIVE EACH ADDITIONAL VESSEL  07/22/2021   IR ANGIOGRAM SELECTIVE EACH ADDITIONAL VESSEL  07/22/2021   IR ANGIOGRAM SELECTIVE EACH ADDITIONAL VESSEL  07/22/2021   IR ANGIOGRAM VISCERAL SELECTIVE  07/01/2021   IR ANGIOGRAM VISCERAL SELECTIVE  07/22/2021   IR EMBO ARTERIAL  NOT HEMORR HEMANG INC GUIDE ROADMAPPING  07/01/2021   IR EMBO TUMOR ORGAN ISCHEMIA INFARCT INC GUIDE ROADMAPPING  07/22/2021   IR RADIOLOGIST EVAL & MGMT  06/01/2021   IR US GUIDE VASC ACCESS RIGHT  07/01/2021   IR US GUIDE VASC ACCESS RIGHT  07/22/2021   NO PAST SURGERIES      I have reviewed the social history and family history with the patient and they are  unchanged from previous note.  ALLERGIES:  has No Known Allergies.  MEDICATIONS:  Current Outpatient Medications  Medication Sig Dispense Refill   Ascorbic Acid (VITAMIN C) 100 MG tablet Take 100 mg by mouth daily.     HYDROcodone-acetaminophen (NORCO) 5-325 MG tablet Take 1 tablet by mouth every 8 (eight) hours as needed for moderate pain. 60 tablet 0   Multiple Vitamin (MULTIVITAMIN WITH MINERALS) TABS tablet Take 1 tablet by mouth daily.     ondansetron (ZOFRAN) 8 MG tablet Take 1 tablet (8 mg total) by mouth every 8 (eight) hours as needed for nausea or vomiting. 30 tablet 0   polyethylene glycol (MIRALAX / GLYCOLAX) 17 g packet Take 17 g by mouth daily as needed for mild constipation. 14 each 0   prochlorperazine (COMPAZINE) 10 MG tablet Take 1 tablet (10 mg total) by mouth every 6 (six) hours as needed for nausea or vomiting. 30 tablet 0   senna (SENOKOT) 8.6 MG TABS tablet Take 1 tablet (8.6 mg total) by mouth 2 (two) times daily. 120 tablet 0   vitamin B-12 (CYANOCOBALAMIN) 100 MCG tablet Take 100 mcg by mouth daily.     No current facility-administered medications for this visit.   Facility-Administered Medications Ordered in Other Visits  Medication Dose Route Frequency Provider Last Rate Last Admin   0.9 %  sodium chloride infusion   Intravenous Once [name], MD       0.9 %  sodium chloride infusion   Intravenous Continuous [name], MD        PHYSICAL EXAMINATION: ECOG PERFORMANCE STATUS: 1 - Symptomatic but completely ambulatory  There were no vitals filed for this visit. Wt Readings from Last 3 Encounters:  09/21/21 125 lb 9 oz (57 kg)  08/31/21 128 lb 9.6 oz (58.3 kg)  08/11/21 128 lb 6.4 oz (58.2 kg)     GENERAL:alert, no distress and comfortable SKIN: skin color, texture, turgor are normal, no rashes or significant lesions EYES: normal, Conjunctiva are pink and non-injected, sclera clear  NECK: supple, thyroid normal size, non-tender, without  nodularity LYMPH:  no palpable lymphadenopathy in the cervical, axillary LUNGS: clear to auscultation and percussion with normal breathing effort HEART: regular rate & rhythm and no murmurs and no lower extremity edema ABDOMEN:abdomen soft, non-tender and normal bowel sounds Musculoskeletal:no cyanosis of digits and no clubbing; (+) tenderness to left bicep area NEURO: alert & oriented x 3 with fluent speech, no focal motor/sensory deficits  LABORATORY DATA:  I have reviewed the data as listed CBC Latest Ref Rng & Units 10/13/2021 09/21/2021 08/31/2021  WBC 4.0 - 10.5 K/uL 3.2(L) 3.3(L) 3.2(L)  Hemoglobin 13.0 - 17.0 g/dL 13.7 13.5 13.2  Hematocrit 39.0 - 52.0 % 40.7 39.4 38.8(L)  Platelets 150 - 400 K/uL 92(L) 119(L) 103(L)     CMP Latest Ref Rng & Units 09/21/2021 08/31/2021 08/11/2021  Glucose 70 - 99 mg/dL 91 83 76  BUN 8 - 23 mg/dL 15 16 15   Creatinine 0.61 - 1.24 mg/dL 0.76 0.78 0.77  Sodium 135 - 145 mmol/L  136 136 136  Potassium 3.5 - 5.1 mmol/L 4.0 4.1 3.9  Chloride 98 - 111 mmol/L 106 104 104  CO2 22 - 32 mmol/L 20(L) 24 23  Calcium 8.9 - 10.3 mg/dL 9.1 9.0 9.7  Total Protein 6.5 - 8.1 g/dL 7.9 7.7 8.3(H)  Total Bilirubin 0.3 - 1.2 mg/dL 0.8 0.5 0.9  Alkaline Phos 38 - 126 U/L 68 74 75  AST 15 - 41 U/L 123(H) 265(HH) 308(HH)  ALT 0 - 44 U/L 99(H) 199(H) 316(HH)      RADIOGRAPHIC STUDIES: I have personally reviewed the radiological images as listed and agreed with the findings in the report. No results found.    No orders of the defined types were placed in this encounter.  All questions were answered. The patient knows to call the clinic with any problems, questions or concerns. No barriers to learning was detected. The total time spent in the appointment was 30 minutes.     [name], MD 10/13/2021   I, Wilburn Mylar, am acting as scribe for [name], MD.   I have reviewed the above documentation for accuracy and completeness, and I agree with the  above.

## 2021-10-13 NOTE — Progress Notes (Signed)
Ok to treat with elevated LFTs and to use the urine protein from 3 weeks ago per Dr.Feng.

## 2021-10-14 ENCOUNTER — Encounter: Payer: Self-pay | Admitting: Hematology

## 2021-10-14 ENCOUNTER — Ambulatory Visit (HOSPITAL_COMMUNITY)
Admission: RE | Admit: 2021-10-14 | Discharge: 2021-10-14 | Disposition: A | Discharge status: Home / Self Care | Payer: Medicare Other | Source: Ambulatory Visit | Attending: Hematology | Admitting: Hematology

## 2021-10-14 DIAGNOSIS — C220 Liver cell carcinoma: Secondary | ICD-10-CM | POA: Insufficient documentation

## 2021-10-14 LAB — AFP TUMOR MARKER: AFP, Serum, Tumor Marker: 12.8 ng/mL — ABNORMAL HIGH (ref 0.0–8.4)

## 2021-10-14 MED ORDER — IOHEXOL 350 MG/ML SOLN
80.0000 mL | Freq: Once | INTRAVENOUS | Status: AC | PRN
  Administered 2021-10-14: 16:00:00 80 mL via INTRAVENOUS

## 2021-10-20 ENCOUNTER — Encounter: Payer: Self-pay | Admitting: Hematology

## 2021-10-26 ENCOUNTER — Ambulatory Visit: Payer: Medicare Other | Admitting: Physician Assistant

## 2021-10-31 ENCOUNTER — Other Ambulatory Visit: Payer: Self-pay

## 2021-10-31 ENCOUNTER — Inpatient Hospital Stay (HOSPITAL_COMMUNITY)
Admission: EM | Admit: 2021-10-31 | Discharge: 2021-11-03 | DRG: 637 | Disposition: A | Discharge status: Home Health | Payer: Medicare Other | Attending: Internal Medicine | Admitting: Internal Medicine

## 2021-10-31 ENCOUNTER — Encounter (HOSPITAL_COMMUNITY): Payer: Self-pay

## 2021-10-31 ENCOUNTER — Emergency Department (HOSPITAL_COMMUNITY): Payer: Medicare Other

## 2021-10-31 DIAGNOSIS — E861 Hypovolemia: Secondary | ICD-10-CM | POA: Diagnosis present

## 2021-10-31 DIAGNOSIS — R739 Hyperglycemia, unspecified: Secondary | ICD-10-CM | POA: Diagnosis not present

## 2021-10-31 DIAGNOSIS — E1100 Type 2 diabetes mellitus with hyperosmolarity without nonketotic hyperglycemic-hyperosmolar coma (NKHHC): Secondary | ICD-10-CM | POA: Diagnosis not present

## 2021-10-31 DIAGNOSIS — R030 Elevated blood-pressure reading, without diagnosis of hypertension: Secondary | ICD-10-CM

## 2021-10-31 DIAGNOSIS — R69 Illness, unspecified: Secondary | ICD-10-CM

## 2021-10-31 DIAGNOSIS — Z6820 Body mass index (BMI) 20.0-20.9, adult: Secondary | ICD-10-CM

## 2021-10-31 DIAGNOSIS — Z79899 Other long term (current) drug therapy: Secondary | ICD-10-CM

## 2021-10-31 DIAGNOSIS — Z923 Personal history of irradiation: Secondary | ICD-10-CM

## 2021-10-31 DIAGNOSIS — R531 Weakness: Secondary | ICD-10-CM

## 2021-10-31 DIAGNOSIS — F32A Depression, unspecified: Secondary | ICD-10-CM | POA: Diagnosis present

## 2021-10-31 DIAGNOSIS — I951 Orthostatic hypotension: Secondary | ICD-10-CM | POA: Diagnosis present

## 2021-10-31 DIAGNOSIS — Z20822 Contact with and (suspected) exposure to covid-19: Secondary | ICD-10-CM | POA: Diagnosis present

## 2021-10-31 DIAGNOSIS — E43 Unspecified severe protein-calorie malnutrition: Secondary | ICD-10-CM | POA: Insufficient documentation

## 2021-10-31 DIAGNOSIS — R7989 Other specified abnormal findings of blood chemistry: Secondary | ICD-10-CM | POA: Diagnosis present

## 2021-10-31 DIAGNOSIS — C220 Liver cell carcinoma: Secondary | ICD-10-CM | POA: Diagnosis present

## 2021-10-31 DIAGNOSIS — Z833 Family history of diabetes mellitus: Secondary | ICD-10-CM

## 2021-10-31 DIAGNOSIS — Z9181 History of falling: Secondary | ICD-10-CM

## 2021-10-31 DIAGNOSIS — E1165 Type 2 diabetes mellitus with hyperglycemia: Secondary | ICD-10-CM

## 2021-10-31 DIAGNOSIS — E119 Type 2 diabetes mellitus without complications: Secondary | ICD-10-CM

## 2021-10-31 LAB — CBC WITH DIFFERENTIAL/PLATELET
Abs Immature Granulocytes: 0.01 10*3/uL (ref 0.00–0.07)
Basophils Absolute: 0 10*3/uL (ref 0.0–0.1)
Basophils Relative: 0 %
Eosinophils Absolute: 0 10*3/uL (ref 0.0–0.5)
Eosinophils Relative: 0 %
HCT: 41.3 % (ref 39.0–52.0)
Hemoglobin: 14.4 g/dL (ref 13.0–17.0)
Immature Granulocytes: 0 %
Lymphocytes Relative: 17 %
Lymphs Abs: 0.8 10*3/uL (ref 0.7–4.0)
MCH: 31.4 pg (ref 26.0–34.0)
MCHC: 34.9 g/dL (ref 30.0–36.0)
MCV: 90.2 fL (ref 80.0–100.0)
Monocytes Absolute: 0.5 10*3/uL (ref 0.1–1.0)
Monocytes Relative: 10 %
Neutro Abs: 3.4 10*3/uL (ref 1.7–7.7)
Neutrophils Relative %: 73 %
Platelets: 94 10*3/uL — ABNORMAL LOW (ref 150–400)
RBC: 4.58 MIL/uL (ref 4.22–5.81)
RDW: 11.9 % (ref 11.5–15.5)
Smear Review: DECREASED
WBC: 4.7 10*3/uL (ref 4.0–10.5)
nRBC: 0 % (ref 0.0–0.2)

## 2021-10-31 LAB — COMPREHENSIVE METABOLIC PANEL
ALT: 74 U/L — ABNORMAL HIGH (ref 0–44)
AST: 62 U/L — ABNORMAL HIGH (ref 15–41)
Albumin: 4.1 g/dL (ref 3.5–5.0)
Alkaline Phosphatase: 63 U/L (ref 38–126)
Anion gap: 15 (ref 5–15)
BUN: 25 mg/dL — ABNORMAL HIGH (ref 8–23)
CO2: 19 mmol/L — ABNORMAL LOW (ref 22–32)
Calcium: 10 mg/dL (ref 8.9–10.3)
Chloride: 92 mmol/L — ABNORMAL LOW (ref 98–111)
Creatinine, Ser: 0.97 mg/dL (ref 0.61–1.24)
GFR, Estimated: >60 mL/min (ref >60)
Glucose, Bld: 536 mg/dL (ref 70–99)
Potassium: 4.7 mmol/L (ref 3.5–5.1)
Sodium: 126 mmol/L — ABNORMAL LOW (ref 135–145)
Total Bilirubin: 1.6 mg/dL — ABNORMAL HIGH (ref 0.3–1.2)
Total Protein: 8.6 g/dL — ABNORMAL HIGH (ref 6.5–8.1)

## 2021-10-31 LAB — URINALYSIS, COMPLETE (UACMP) WITH MICROSCOPIC
Bacteria, UA: NONE SEEN
Bilirubin Urine: NEGATIVE
Glucose, UA: >=500 mg/dL — AB
Hgb urine dipstick: NEGATIVE
Ketones, ur: 80 mg/dL — AB
Leukocytes,Ua: NEGATIVE
Nitrite: NEGATIVE
Protein, ur: NEGATIVE mg/dL
Specific Gravity, Urine: 1.028 (ref 1.005–1.030)
pH: 5 (ref 5.0–8.0)

## 2021-10-31 LAB — BLOOD GAS, VENOUS
Acid-base deficit: 3.6 mmol/L — ABNORMAL HIGH (ref 0.0–2.0)
Bicarbonate: 22.1 mmol/L (ref 20.0–28.0)
O2 Saturation: 52.6 %
Patient temperature: 98.6
pCO2, Ven: 44.2 mmHg (ref 44.0–60.0)
pH, Ven: 7.319 (ref 7.250–7.430)
pO2, Ven: 32.2 mmHg (ref 32.0–45.0)

## 2021-10-31 LAB — CBG MONITORING, ED: Glucose-Capillary: 484 mg/dL — ABNORMAL HIGH (ref 70–99)

## 2021-10-31 IMAGING — CT CT HEAD W/O CM
3 of 4 series · 15 of 47 positions shown, 18 images · non-contrast
Comparison: None.

CLINICAL DATA: Weakness, status post recent fall.



[Series 2: head wo · axial · 0.44mm/px · z∈[-102,+23]mm · 9 of 33 slices shown, 12 images]
[im 4/33  brain]
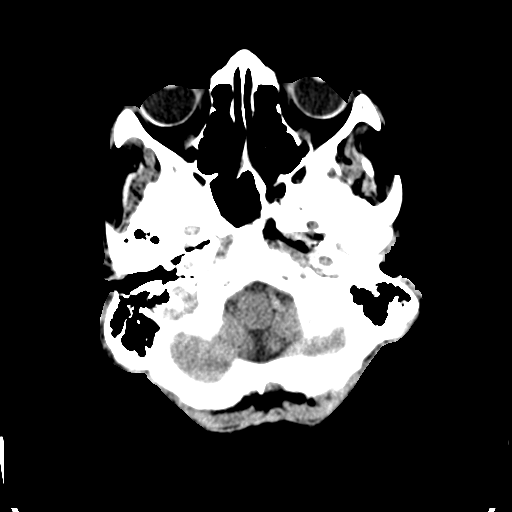
[im 4/33  bone]
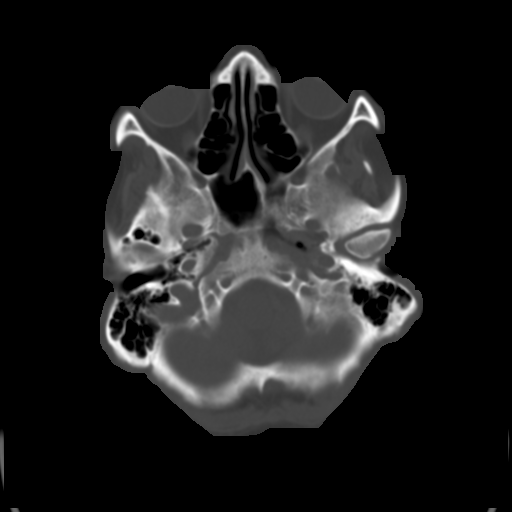
[im 7/33  brain]
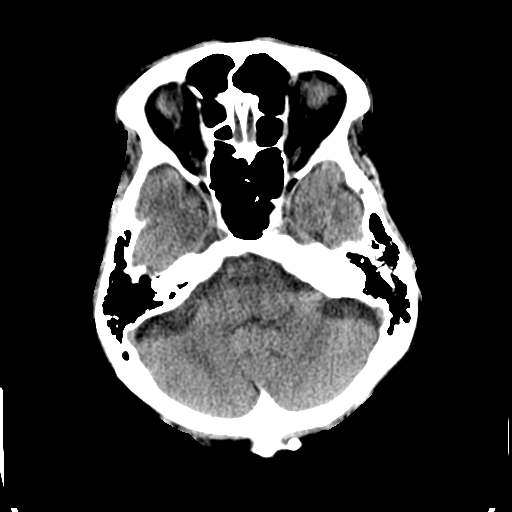
[im 10/33  brain]
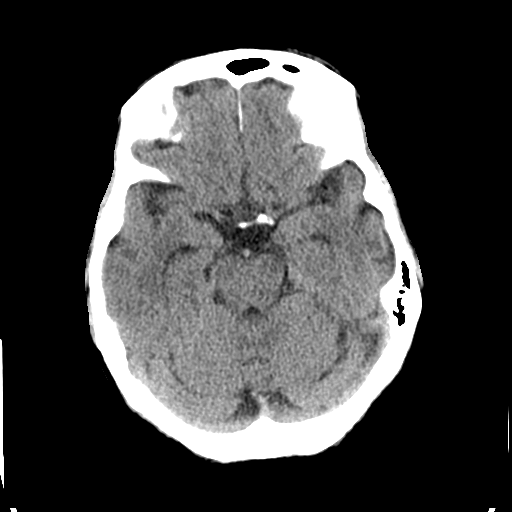
[im 13/33  brain]
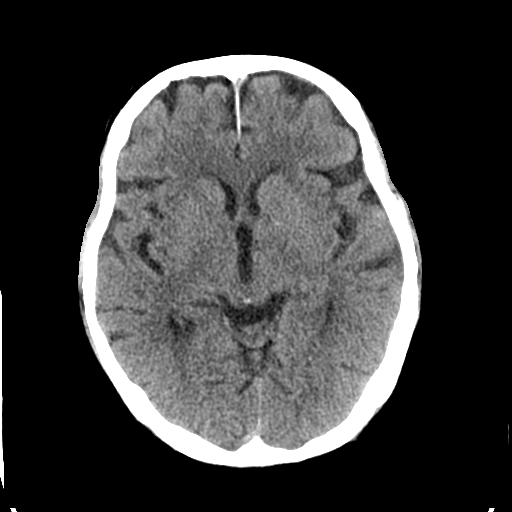
[im 17/33  brain]
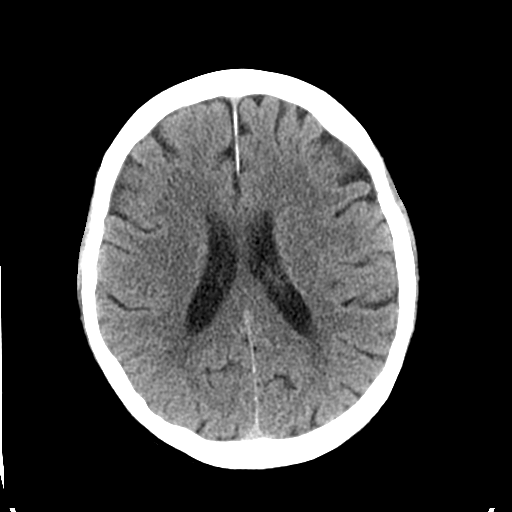
[im 17/33  bone]
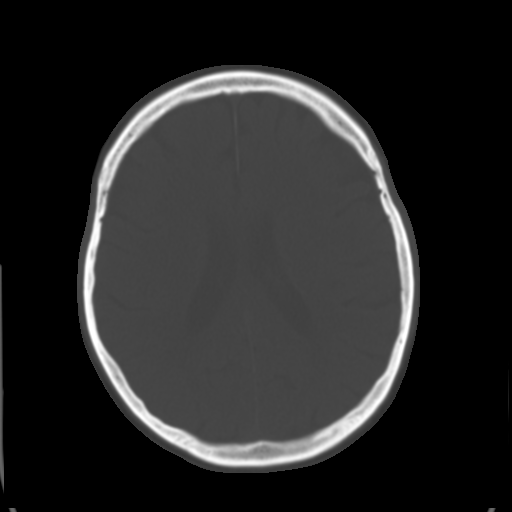
[im 20/33  brain]
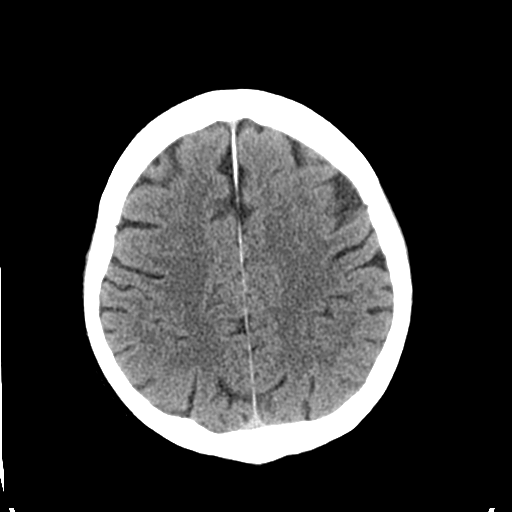
[im 23/33  brain]
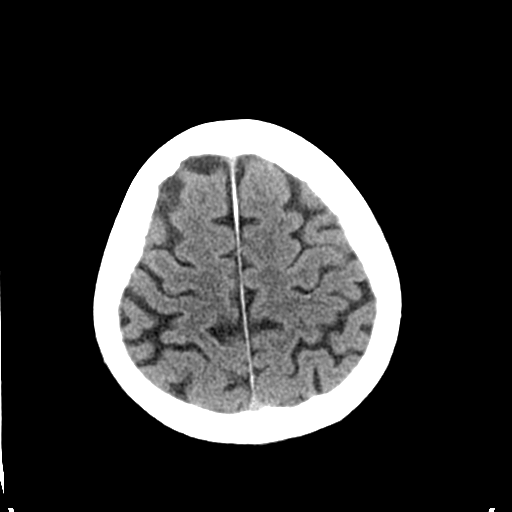
[im 26/33  brain]
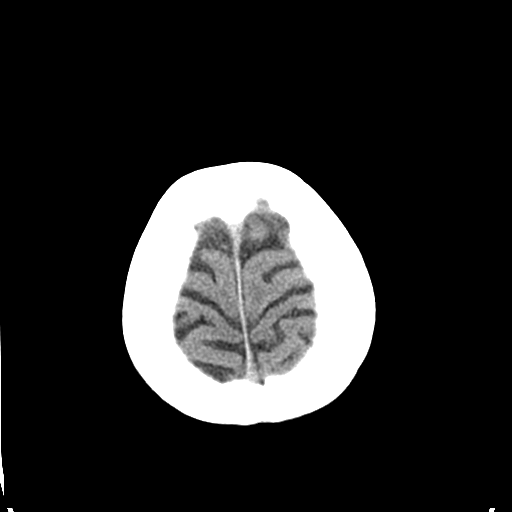
[im 29/33  brain]
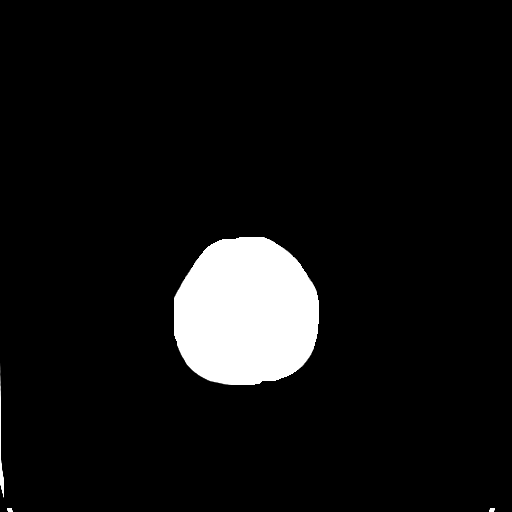
[im 29/33  bone]
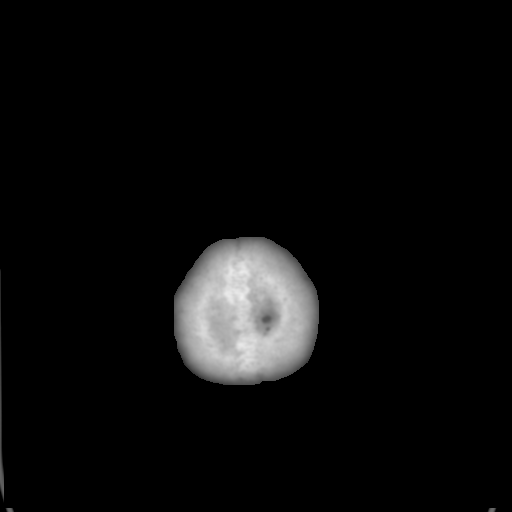

[Series 5: coronal soft tissue · coronal · 0.31mm/px · 3 of 70 slices shown]
[im 24/70  brain]
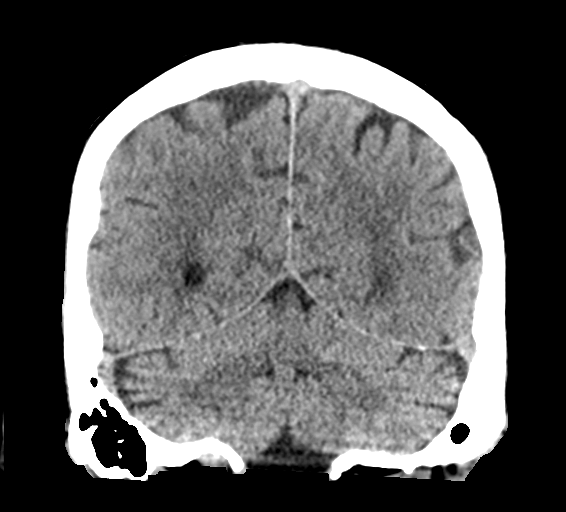
[im 31/70  brain]
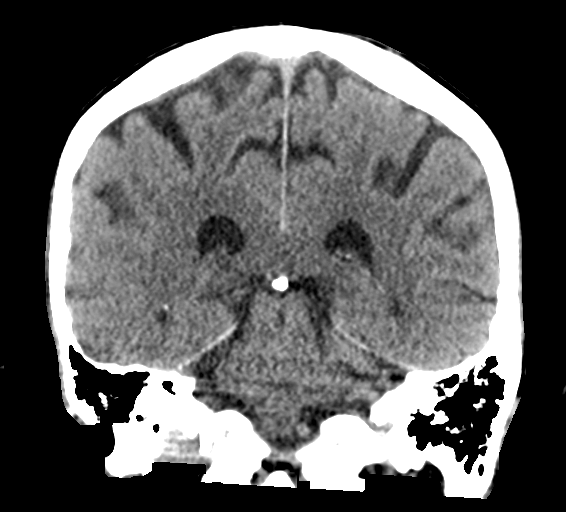
[im 39/70  brain]
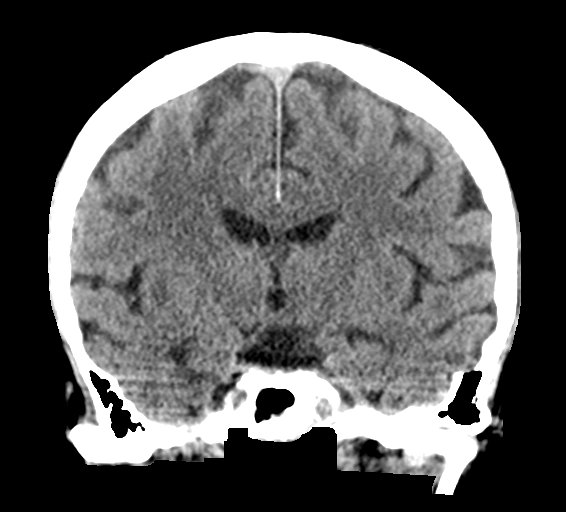

[Series 6: sagittal soft tissue · sagittal · 0.31mm/px · 3 of 60 slices shown]
[im 20/60  brain]
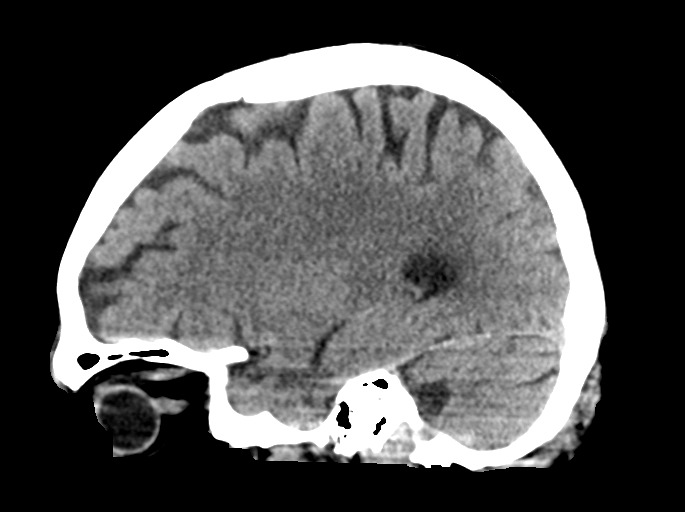
[im 30/60  brain]
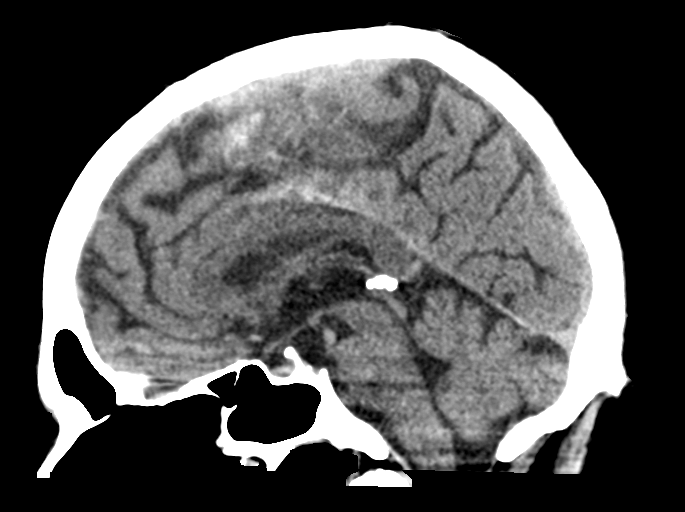
[im 40/60  brain]
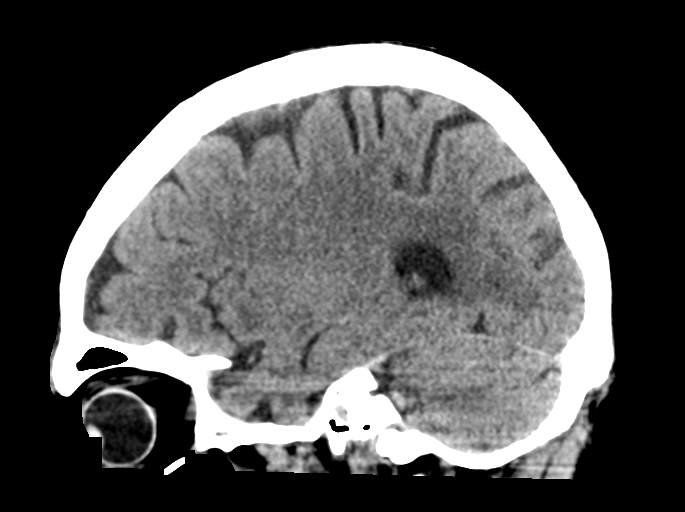

[15 of 47 positions shown; findings below may reference images not displayed]

FINDINGS: Brain: There is mild cerebral atrophy with widening of the
extra-axial spaces and ventricular dilatation.
There are areas of decreased attenuation within the white matter
tracts of the supratentorial brain, consistent with microvascular
disease changes.

Vascular: No hyperdense vessel or unexpected calcification.

Skull: Normal. Negative for fracture or focal lesion.

Sinuses/Orbits: No acute finding.

Other: None.
IMPRESSION: 1. Generalized cerebral atrophy and chronic white matter ischemic
changes without evidence of an acute intracranial abnormality.

## 2021-10-31 MED ORDER — LACTATED RINGERS IV BOLUS
20.0000 mL/kg | Freq: Once | INTRAVENOUS | Status: AC
  Administered 2021-10-31: 1144 mL via INTRAVENOUS

## 2021-10-31 MED ORDER — INSULIN ASPART 100 UNIT/ML IJ SOLN
8.0000 [IU] | Freq: Once | INTRAMUSCULAR | Status: AC
  Administered 2021-10-31: 8 [IU] via SUBCUTANEOUS
  Filled 2021-10-31: qty 0.08

## 2021-10-31 NOTE — ED Triage Notes (Signed)
Pt's care taker states that pt has been weak and hasn't been eating x 3 days. Pt has been receiving chemo treatments at the cancer center and has another appt tomorrow. Pt had a fall a few days ago from being weak. Pt states that he fell forward and hit the front of his head, no marks.

## 2021-10-31 NOTE — H&P (Signed)
History and Physical    Shawn Bailey LPF:[id number] DOB: [birth year] DOA: 10/31/2021  PCP: [facility]   Patient coming from: Home  Chief Complaint: Frequent thirst, urination, fatigue  HPI: Shawn Bailey is a 76 y.o. 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  He has been treated with radiation therapy to his liver and is on chemotherapy with oncology at this time.  For the last 3 to 4 days he has not been able to eat anything secondary to loss of appetite.  He has been drinking water as he has been constantly thirsty and has been urinating frequently.  He has no dysuria.  He has not had any abdominal pain or flank pain.  Reports increased fatigue and generalized weakness with trouble standing the last few days.  He states that 1 fall last night when he stood up and his legs gave out on him.  He did not have any loss of consciousness.  States he did hit his head on the floor and he had acute abnormalities on CT of his head in the emergency room.  They he required assistance to get in and out of his recliner.  He has been having abdominal pain since October that is not worsened and is managed with pain medication at home.  He has not had any swelling of his extremities.  He has no history of DVT or PE.  Denies any chest pain, palpitations.  Lives alone.  Denies tobacco alcohol or illicit drug use  ED Course: Shawn Bailey has been hemodynamically stable in the emergency room with elevated blood pressure readings of 149-160/69-85.  Reports he has no history of hypertension.  CT of the head was negative for acute intracranial pathology but did show generalized cerebral atrophy and chronic white matter ischemic changes.  He was negative for infiltrate or consolidations.  Lab work revealed blood sugar of 536, sodium 133 when corrected for hyperglycemia, potassium 4.7 chloride 92 bicarb 19 creatinine 0.97 BUN 25 anion  gap 15 calcium 10 alkaline phosphatase 63 AST 62 ALT 74 albumin 4.1 bilirubin 1.6, VBG shows pH 7.319 PCO2 44.2 PO2 32.2 bicarb 22.1, CBC is unremarkable.  COVID swab pending.  Was given IV fluid hydration in the emergency room but not been given insulin prior to hospitalist service being contacted for admission    Review of Systems:  General: Reports decreased appetite. Denies fever, chills, weight loss, night sweats.  Denies dizziness.  HENT: Denies head trauma, headache, denies change in hearing, tinnitus. Denies nasal congestion or bleeding.  Denies sore throat, sores in mouth. Denies difficulty swallowing Eyes: Denies blurry vision, pain in eye, drainage.  Denies discoloration of eyes. Neck: Denies pain. Denies swelling.  Denies pain with movement. Cardiovascular: Denies chest pain, palpitations. Denies edema. Denies orthopnea Respiratory: Denies shortness of breath, cough. Denies wheezing. Denies sputum production Gastrointestinal: Denies abdominal pain, swelling. Denies nausea, vomiting, diarrhea. Denies melena. Denies hematemesis. Musculoskeletal: Denies limitation of movement. Denies deformity or swelling.Denies arthralgias or myalgias. Genitourinary: Denies pelvic pain.  Denies urinary hesitancy.  Denies dysuria.  Skin: Denies rash.  Denies petechiae, purpura, ecchymosis. Neurological: Denies syncope. Denies seizure activity. Denies slurred speech, drooping face.  Denies visual change. Psychiatric: Denies depression, anxiety.   Past Medical History:  Diagnosis Date   Depression    Hepatocellular carcinoma ([place]) 03/08/2021   Parasite infestation     Past Surgical History:  Procedure Laterality Date   IR ANGIOGRAM SELECTIVE EACH ADDITIONAL VESSEL  07/01/2021   IR ANGIOGRAM SELECTIVE EACH ADDITIONAL VESSEL  07/22/2021   IR ANGIOGRAM SELECTIVE EACH ADDITIONAL VESSEL  07/22/2021   IR ANGIOGRAM SELECTIVE EACH ADDITIONAL VESSEL  07/22/2021   IR ANGIOGRAM SELECTIVE EACH ADDITIONAL VESSEL   07/22/2021   IR ANGIOGRAM VISCERAL SELECTIVE  07/01/2021   IR ANGIOGRAM VISCERAL SELECTIVE  07/22/2021   IR EMBO ARTERIAL NOT HEMORR HEMANG INC GUIDE ROADMAPPING  07/01/2021   IR EMBO TUMOR ORGAN ISCHEMIA INFARCT INC GUIDE ROADMAPPING  07/22/2021   IR RADIOLOGIST EVAL & MGMT  06/01/2021   IR US GUIDE VASC ACCESS RIGHT  07/01/2021   IR US GUIDE VASC ACCESS RIGHT  07/22/2021   NO PAST SURGERIES      Social History  reports that he has never smoked. He has never used smokeless tobacco. He reports current alcohol use. He reports that he does not currently use drugs.  No Known Allergies  Family History  Problem Relation Age of Onset   Diabetes Mother      Prior to Admission medications   Medication Sig Start Date End Date Taking? Authorizing Provider  Ascorbic Acid (VITAMIN C) 100 MG tablet Take 100 mg by mouth daily.    [provider]  HYDROcodone-acetaminophen (NORCO) 5-325 MG tablet Take 1 tablet by mouth every 8 (eight) hours as needed for moderate pain. 09/21/21   [name], MD  Multiple Vitamin (MULTIVITAMIN WITH MINERALS) TABS tablet Take 1 tablet by mouth daily.    [provider]  ondansetron (ZOFRAN) 8 MG tablet Take 1 tablet (8 mg total) by mouth every 8 (eight) hours as needed for nausea or vomiting. 04/28/21   Dede Query T, PA-C  polyethylene glycol (MIRALAX / GLYCOLAX) 17 g packet Take 17 g by mouth daily as needed for mild constipation. 03/08/21   Georgette Shell, MD  prochlorperazine (COMPAZINE) 10 MG tablet Take 1 tablet (10 mg total) by mouth every 6 (six) hours as needed for nausea or vomiting. 04/28/21   Lincoln Brigham, PA-C  senna (SENOKOT) 8.6 MG TABS tablet Take 1 tablet (8.6 mg total) by mouth 2 (two) times daily. 03/08/21   Georgette Shell, MD  vitamin B-12 (CYANOCOBALAMIN) 100 MCG tablet Take 100 mcg by mouth daily.    [provider]    Physical Exam: Vitals:   10/31/21 1935 10/31/21 2100 10/31/21 2200  BP: (!) 154/85 (!) 160/69  (!) 149/84  Pulse: 82 86 78  Resp: 16 15 18   Temp: 98.2 F (36.8 C)    TempSrc: Oral    SpO2: 96% 95% 94%  Weight: 57.2 kg    Height: 5\' 5"  (1.651 m)      Constitutional: NAD, calm, comfortable Vitals:   10/31/21 1935 10/31/21 2100 10/31/21 2200  BP: (!) 154/85 (!) 160/69 (!) 149/84  Pulse: 82 86 78  Resp: 16 15 18   Temp: 98.2 F (36.8 C)    TempSrc: Oral    SpO2: 96% 95% 94%  Weight: 57.2 kg    Height: 5\' 5"  (1.651 m)     General: WDWN, Alert and oriented x3.  Eyes: EOMI, PERRL, conjunctivae normal.  Sclera nonicteric HENT:  [place]/AT, external ears normal.  Nares patent without epistasis.  Mucous membranes are dry  Neck: Soft, normal range of motion, supple, no masses, no thyromegaly.  Trachea midline Respiratory: clear to auscultation bilaterally, no wheezing, no crackles. Normal respiratory effort. No accessory muscle use.  Cardiovascular: Regular rate and rhythm, no murmurs / rubs / gallops. No extremity edema. 2+  pedal pulses.  Abdomen: Soft, no tenderness, nondistended, no rebound or guarding.  No masses palpated. Bowel sounds normoactive Musculoskeletal: FROM. no cyanosis. No joint deformity upper and lower extremities. Normal muscle tone.  Skin: Warm, dry, intact no rashes, lesions. No induration Neurologic: CN 2-12 grossly intact.  Normal speech.  Sensation intact to touch. Strength 4/5 in all extremities.   Psychiatric: Normal judgment and insight. Normal mood.   Labs on Admission: I have personally reviewed following labs and imaging studies  CBC: Recent Labs  Lab 10/31/21 1951  WBC 4.7  NEUTROABS 3.4  HGB 14.4  HCT 41.3  MCV 90.2  PLT 94*    Basic Metabolic Panel: Recent Labs  Lab 10/31/21 1951  NA 126*  K 4.7  CL 92*  CO2 19*  GLUCOSE 536*  BUN 25*  CREATININE 0.97  CALCIUM 10.0    GFR: Estimated Creatinine Clearance: 52.4 mL/min (by C-G formula based on SCr of 0.97 mg/dL).  Liver Function Tests: Recent Labs  Lab 10/31/21 1951  AST 62*   ALT 74*  ALKPHOS 63  BILITOT 1.6*  PROT 8.6*  ALBUMIN 4.1    Urine analysis:    Component Value Date/Time   COLORURINE STRAW (A) 10/31/2021 2141   APPEARANCEUR CLEAR 10/31/2021 2141   LABSPEC 1.028 10/31/2021 2141   PHURINE 5.0 10/31/2021 2141   GLUCOSEU >=500 (A) 10/31/2021 2141   HGBUR NEGATIVE 10/31/2021 2141   BILIRUBINUR NEGATIVE 10/31/2021 2141   KETONESUR 80 (A) 10/31/2021 2141   PROTEINUR NEGATIVE 10/31/2021 2141   UROBILINOGEN 0.2 05/05/2007 2151   NITRITE NEGATIVE 10/31/2021 2141   LEUKOCYTESUR NEGATIVE 10/31/2021 2141    Radiological Exams on Admission: DG Chest 1 View  Result Date: 10/31/2021 CLINICAL DATA:  Increasing weakness EXAM: PORTABLE CHEST 1 VIEW COMPARISON:  03/05/2021 FINDINGS: The heart size and mediastinal contours are within normal limits. Both lungs are clear. The visualized skeletal structures are unremarkable. IMPRESSION: No active disease. Electronically Signed   By: Inez Catalina M.D.   On: 10/31/2021 22:09   CT Head Wo Contrast  Result Date: 10/31/2021 CLINICAL DATA:  Weakness, status post recent fall. EXAM: CT HEAD WITHOUT CONTRAST TECHNIQUE: Contiguous axial images were obtained from the base of the skull through the vertex without intravenous contrast. RADIATION DOSE REDUCTION: This exam was performed according to the departmental dose-optimization program which includes automated exposure control, adjustment of the mA and/or kV according to patient size and/or use of iterative reconstruction technique. COMPARISON:  None. FINDINGS: Brain: There is mild cerebral atrophy with widening of the extra-axial spaces and ventricular dilatation. There are areas of decreased attenuation within the white matter tracts of the supratentorial brain, consistent with microvascular disease changes. Vascular: No hyperdense vessel or unexpected calcification. Skull: Normal. Negative for fracture or focal lesion. Sinuses/Orbits: No acute finding. Other: None. IMPRESSION:  1. Generalized cerebral atrophy and chronic white matter ischemic changes without evidence of an acute intracranial abnormality. Electronically Signed   By: Virgina Norfolk M.D.   On: 10/31/2021 22:26    Assessment/Plan Principal Problem:   Hyperglycemia Shawn Bailey is placed on Med-Surg floor for observation. Initial blood sugar was 536 and decreased to 486 with IVF hydration in the ER. No history of DM.  Had hemoglobin A1c of 5.2 in May 2022.  Has had radiation therapy to his liver and is on chemotherapy for hepatocellular carcinoma.  Started this therapy in October. Will treat with SQ insulin and monitor blood sugars closely.   Patient does not have an elevated anion  gap and pH is normal at 7.319 with normal bicarbonate level and not in DKA.  Check hemoglobin A1c.  Continue IVF hydration with LR.   Active Problems:   Hepatocellular carcinoma  Followed by Oncology and on chemotherapy    Generalized weakness Fall precautions. Up with assistance. Secondary to chemotherapy and hyperglycemia.    Blood pressure elevated without history of HTN Monitor BP. If remains elevated would start antihypertensive therapy with ACEI or ARB  DVT prophylaxis: Lovenox for DVT prophylaxis.   Code Status:   Full Code  Family Communication:  Diagnosis and plan discussed with patient and his family.  Questions answered.  They verbalized understanding agree with plan.  Further recommendations to follow as clinically indicated Disposition Plan:   Patient is from:  Home  Anticipated DC to:  Home  Anticipated DC date:  Anticipate 2 midnight or less stay in the hospital  Time spent:   40 minutes  Admission status:  Observation  Eben Burow MD [facility]  How to contact the [facility] Attending or Consulting provider [place] or covering provider during after hours [place], for this patient?   Check the care team in [facility] and look for a) attending/consulting [facility] provider listed and b) the [facility] team  listed Log into [email or web address] and use [place]'s universal password to access. If you do not have the password, please contact the hospital operator. Locate the [facility] provider you are looking for under [facility] and page to a number that you can be directly reached. If you still have difficulty reaching the provider, please page the [facility] (Director on Call) for the Hospitalists listed on amion for assistance.  10/31/2021, 11:30 PM

## 2021-10-31 NOTE — ED Provider Notes (Signed)
[facility] DEPT Provider Note   CSN: [id number] Arrival date & time: 10/31/21  1924     History  Chief Complaint  Patient presents with   Weakness    Shawn Bailey is a 76 y.o. male.  HPI Patient reports for 4 days he has been unable to eat anything.  He has had complete loss of appetite.  He reports he has had significantly increased thirst and has been only drinking water for the past 4 days.  He reports that he has been urinating frequently.  However, he is getting increasingly weak and having trouble standing.  He reports last night he tried to get up and he fell.  He reports he did hit his head but he does not think he got knocked out.  He denies that he is having headache.  Patient's ex-wife continues to assist in his care.  She reports today he was so weak that he required assistance to get up out of his recliner.  Patient denies he has had any fever that he is aware of.  He has not been vomiting or having diarrhea.  He does have known hepatic cancer for which she is getting treated.  He reports he does have some abdominal pain but it does not seem acutely worse.  No lower extremity swelling.  No Chest pain or shortness of breath.    Home Medications Prior to Admission medications   Medication Sig Start Date End Date Taking? Authorizing Provider  Ascorbic Acid (VITAMIN C) 100 MG tablet Take 100 mg by mouth daily.    [provider]  HYDROcodone-acetaminophen (NORCO) 5-325 MG tablet Take 1 tablet by mouth every 8 (eight) hours as needed for moderate pain. 09/21/21   [name], MD  Multiple Vitamin (MULTIVITAMIN WITH MINERALS) TABS tablet Take 1 tablet by mouth daily.    [provider]  ondansetron (ZOFRAN) 8 MG tablet Take 1 tablet (8 mg total) by mouth every 8 (eight) hours as needed for nausea or vomiting. 04/28/21   Dede Query T, PA-C  polyethylene glycol (MIRALAX / GLYCOLAX) 17 g packet Take 17 g by mouth daily as needed  for mild constipation. 03/08/21   Georgette Shell, MD  prochlorperazine (COMPAZINE) 10 MG tablet Take 1 tablet (10 mg total) by mouth every 6 (six) hours as needed for nausea or vomiting. 04/28/21   Lincoln Brigham, PA-C  senna (SENOKOT) 8.6 MG TABS tablet Take 1 tablet (8.6 mg total) by mouth 2 (two) times daily. 03/08/21   Georgette Shell, MD  vitamin B-12 (CYANOCOBALAMIN) 100 MCG tablet Take 100 mcg by mouth daily.    [provider]      Allergies    Patient has no known allergies.    Review of Systems   Review of Systems 10 systems reviewed negative except as per HPI Physical Exam Updated Vital Signs BP (!) 160/69    Pulse 86    Temp 98.2 F (36.8 C) (Oral)    Resp 15    Ht 5\' 5"  (1.651 m)    Wt 57.2 kg    SpO2 95%    BMI 20.97 kg/m  Physical Exam Constitutional:      Comments: Patient is very thin.  He is alert.  No appearance of confusion.  No respiratory distress.  HENT:     Head: Normocephalic and atraumatic.     Mouth/Throat:     Mouth: Mucous membranes are moist.     Pharynx: Oropharynx is clear.  Eyes:     Extraocular Movements: Extraocular movements intact.     Conjunctiva/sclera: Conjunctivae normal.  Cardiovascular:     Rate and Rhythm: Normal rate and regular rhythm.  Pulmonary:     Effort: Pulmonary effort is normal.     Breath sounds: Normal breath sounds.  Abdominal:     Comments: Abdomen is soft without guarding.  No significant reproducible pain at this time.  No distention.  Musculoskeletal:        General: No swelling or tenderness. Normal range of motion.     Cervical back: Neck supple.     Right lower leg: No edema.     Left lower leg: No edema.  Skin:    General: Skin is warm and dry.  Neurological:     General: No focal deficit present.     Mental Status: He is oriented to person, place, and time.     Cranial Nerves: No cranial nerve deficit.     Comments: Generalized weakness but no focal motor deficits.  Speech is clear.  No  receptive or expressive aphasia.  Follows commands appropriately.  Psychiatric:        Mood and Affect: Mood normal.     Comments: Patient's mood is pleasant.    ED Results / Procedures / Treatments   Labs (all labs ordered are listed, but only abnormal results are displayed) Labs Reviewed  CBC WITH DIFFERENTIAL/PLATELET - Abnormal; Notable for the following components:      Result Value   Platelets 94 (*)    All other components within normal limits  COMPREHENSIVE METABOLIC PANEL - Abnormal; Notable for the following components:   Sodium 126 (*)    Chloride 92 (*)    CO2 19 (*)    Glucose, Bld 536 (*)    BUN 25 (*)    Total Protein 8.6 (*)    AST 62 (*)    ALT 74 (*)    Total Bilirubin 1.6 (*)    All other components within normal limits  URINALYSIS, ROUTINE W REFLEX MICROSCOPIC    EKG None  Radiology No results found.  Procedures Procedures    Medications Ordered in ED Medications - No data to display  ED Course/ Medical Decision Making/ A&P                           Medical Decision Making Amount and/or Complexity of Data Reviewed Labs: ordered. Radiology: ordered.  Risk Decision regarding hospitalization.   EMR extensively reviewed.  Patient has history of Multifocal hepatocellular carcinoma, cT3N0M, stage IIIA and hepatitis C. no identified metastatic disease.  He has been treated with chemotherapy:Atezolizumab and bevacizumab q 3 weeks Started 7\13\22.  As per HPI, patient has more acutely gotten severe generalized weakness and gradual weight loss.  Diagnostic results at this time show significant hyperglycemia with blood glucose at 536.  Patient does not have any prior history of diabetes.  Hyperglycemia is consistent with the symptoms as described.  Will initiate fluid resuscitation with lactated Ringer's.  Will expand diagnostic evaluation.  Patient did report a fall last night with head injury but no symptoms.  Due to significant comorbid illness and  severe weakness with fall, will add CT head.  Patient is neurologically intact.  He is not showing any signs at this point time of being encephalopathic.  No focal neurologic deficits.  At this point with severe generalized weakness and significant hyperglycemia, plan will be for admission.  Patient is nontoxic without hypotension, fever or tachycardia at this time no indication of infectious source.  Low suspicion for sepsis.  Patient does not have chest pain cough or shortness of breath.  Low suspicion at this time for PE or ACS.  Diagnostic results plan and treatment reviewed at bedside with the patient and his ex-wife who is also his ongoing caregiver and assistant.  Consult: [facility] hospital[facility] for admission.  Final Clinical Impression(s) / ED Diagnoses Final diagnoses:  Weakness  Newly diagnosed diabetes ([place])  Severe hyperglycemia due to diabetes mellitus ([place])  Severe comorbid illness    Rx / DC Orders ED Discharge Orders     None         Charlesetta Shanks, MD 11/06/21 1433

## 2021-11-01 ENCOUNTER — Other Ambulatory Visit: Payer: Medicare Other

## 2021-11-01 ENCOUNTER — Encounter: Payer: Medicare Other | Admitting: Nutrition

## 2021-11-01 ENCOUNTER — Ambulatory Visit: Payer: Medicare Other

## 2021-11-01 ENCOUNTER — Ambulatory Visit: Payer: Medicare Other | Admitting: Hematology

## 2021-11-01 DIAGNOSIS — E1100 Type 2 diabetes mellitus with hyperosmolarity without nonketotic hyperglycemic-hyperosmolar coma (NKHHC): Secondary | ICD-10-CM | POA: Diagnosis present

## 2021-11-01 DIAGNOSIS — Z20822 Contact with and (suspected) exposure to covid-19: Secondary | ICD-10-CM | POA: Diagnosis present

## 2021-11-01 DIAGNOSIS — I951 Orthostatic hypotension: Secondary | ICD-10-CM | POA: Diagnosis present

## 2021-11-01 DIAGNOSIS — E43 Unspecified severe protein-calorie malnutrition: Secondary | ICD-10-CM | POA: Insufficient documentation

## 2021-11-01 DIAGNOSIS — R030 Elevated blood-pressure reading, without diagnosis of hypertension: Secondary | ICD-10-CM | POA: Diagnosis present

## 2021-11-01 DIAGNOSIS — F32A Depression, unspecified: Secondary | ICD-10-CM | POA: Diagnosis present

## 2021-11-01 DIAGNOSIS — R7989 Other specified abnormal findings of blood chemistry: Secondary | ICD-10-CM | POA: Diagnosis present

## 2021-11-01 DIAGNOSIS — R739 Hyperglycemia, unspecified: Secondary | ICD-10-CM | POA: Diagnosis present

## 2021-11-01 DIAGNOSIS — Z9181 History of falling: Secondary | ICD-10-CM | POA: Diagnosis not present

## 2021-11-01 DIAGNOSIS — Z79899 Other long term (current) drug therapy: Secondary | ICD-10-CM | POA: Diagnosis not present

## 2021-11-01 DIAGNOSIS — C220 Liver cell carcinoma: Secondary | ICD-10-CM | POA: Diagnosis present

## 2021-11-01 DIAGNOSIS — Z923 Personal history of irradiation: Secondary | ICD-10-CM | POA: Diagnosis not present

## 2021-11-01 DIAGNOSIS — Z6820 Body mass index (BMI) 20.0-20.9, adult: Secondary | ICD-10-CM | POA: Diagnosis not present

## 2021-11-01 DIAGNOSIS — E1165 Type 2 diabetes mellitus with hyperglycemia: Secondary | ICD-10-CM | POA: Diagnosis not present

## 2021-11-01 DIAGNOSIS — R531 Weakness: Secondary | ICD-10-CM | POA: Diagnosis present

## 2021-11-01 DIAGNOSIS — E861 Hypovolemia: Secondary | ICD-10-CM | POA: Diagnosis present

## 2021-11-01 DIAGNOSIS — Z833 Family history of diabetes mellitus: Secondary | ICD-10-CM | POA: Diagnosis not present

## 2021-11-01 LAB — LACTIC ACID, PLASMA
Lactic Acid, Venous: 2.3 mmol/L (ref 0.5–1.9)
Lactic Acid, Venous: 2.3 mmol/L (ref 0.5–1.9)

## 2021-11-01 LAB — GLUCOSE, CAPILLARY
Glucose-Capillary: 101 mg/dL — ABNORMAL HIGH (ref 70–99)
Glucose-Capillary: 118 mg/dL — ABNORMAL HIGH (ref 70–99)
Glucose-Capillary: 152 mg/dL — ABNORMAL HIGH (ref 70–99)
Glucose-Capillary: 215 mg/dL — ABNORMAL HIGH (ref 70–99)
Glucose-Capillary: 291 mg/dL — ABNORMAL HIGH (ref 70–99)
Glucose-Capillary: 157 mg/dL — ABNORMAL HIGH (ref 70–99)

## 2021-11-01 LAB — CBC
HCT: 35.2 % — ABNORMAL LOW (ref 39.0–52.0)
Hemoglobin: 12.4 g/dL — ABNORMAL LOW (ref 13.0–17.0)
MCH: 32 pg (ref 26.0–34.0)
MCHC: 35.2 g/dL (ref 30.0–36.0)
MCV: 90.7 fL (ref 80.0–100.0)
Platelets: 75 10*3/uL — ABNORMAL LOW (ref 150–400)
RBC: 3.88 MIL/uL — ABNORMAL LOW (ref 4.22–5.81)
RDW: 11.9 % (ref 11.5–15.5)
WBC: 3.1 10*3/uL — ABNORMAL LOW (ref 4.0–10.5)
nRBC: 0 % (ref 0.0–0.2)

## 2021-11-01 LAB — CBG MONITORING, ED: Glucose-Capillary: 130 mg/dL — ABNORMAL HIGH (ref 70–99)

## 2021-11-01 LAB — OSMOLALITY: Osmolality: 306 mOsm/kg — ABNORMAL HIGH (ref 275–295)

## 2021-11-01 LAB — BASIC METABOLIC PANEL
Anion gap: 7 (ref 5–15)
BUN: 20 mg/dL (ref 8–23)
CO2: 25 mmol/L (ref 22–32)
Calcium: 9.1 mg/dL (ref 8.9–10.3)
Chloride: 101 mmol/L (ref 98–111)
Creatinine, Ser: 0.67 mg/dL (ref 0.61–1.24)
GFR, Estimated: >60 mL/min (ref >60)
Glucose, Bld: 155 mg/dL — ABNORMAL HIGH (ref 70–99)
Potassium: 3.7 mmol/L (ref 3.5–5.1)
Sodium: 133 mmol/L — ABNORMAL LOW (ref 135–145)

## 2021-11-01 LAB — RESP PANEL BY RT-PCR (FLU A&B, COVID) ARPGX2
Influenza A by PCR: NEGATIVE
Influenza B by PCR: NEGATIVE
SARS Coronavirus 2 by RT PCR: NEGATIVE

## 2021-11-01 LAB — BETA-HYDROXYBUTYRIC ACID
Beta-Hydroxybutyric Acid: 4.35 mmol/L — ABNORMAL HIGH (ref 0.05–0.27)
Beta-Hydroxybutyric Acid: 0.73 mmol/L — ABNORMAL HIGH (ref 0.05–0.27)
Beta-Hydroxybutyric Acid: 1.84 mmol/L — ABNORMAL HIGH (ref 0.05–0.27)

## 2021-11-01 LAB — HEMOGLOBIN A1C
Hgb A1c MFr Bld: 7.1 % — ABNORMAL HIGH (ref 4.8–5.6)
Mean Plasma Glucose: 157.07 mg/dL

## 2021-11-01 MED ORDER — ONDANSETRON HCL 4 MG PO TABS: 4.0000 mg | ORAL_TABLET | Freq: Four times a day (QID) | ORAL | Status: DC | PRN

## 2021-11-01 MED ORDER — ACETAMINOPHEN 325 MG PO TABS: 650.0000 mg | ORAL_TABLET | Freq: Four times a day (QID) | ORAL | Status: DC | PRN

## 2021-11-01 MED ORDER — INSULIN ASPART 100 UNIT/ML IJ SOLN
0.0000 [IU] | Freq: Three times a day (TID) | INTRAMUSCULAR | Status: DC
  Administered 2021-11-01: 2 [IU] via SUBCUTANEOUS
  Administered 2021-11-01: 3 [IU] via SUBCUTANEOUS
  Filled 2021-11-01: qty 0.09

## 2021-11-01 MED ORDER — ENSURE ENLIVE PO LIQD: 237.0000 mL | ORAL | Status: DC

## 2021-11-01 MED ORDER — METFORMIN HCL 500 MG PO TABS: 500.0000 mg | ORAL_TABLET | Freq: Two times a day (BID) | ORAL | Status: DC

## 2021-11-01 MED ORDER — ENSURE MAX PROTEIN PO LIQD
11.0000 [oz_av] | Freq: Two times a day (BID) | ORAL | Status: DC
  Administered 2021-11-01 – 2021-11-03 (×3): 11 [oz_av] via ORAL
  Filled 2021-11-01 (×6): qty 330

## 2021-11-01 MED ORDER — ACETAMINOPHEN 650 MG RE SUPP: 650.0000 mg | Freq: Four times a day (QID) | RECTAL | Status: DC | PRN

## 2021-11-01 MED ORDER — INSULIN ASPART 100 UNIT/ML IJ SOLN: 0.0000 [IU] | Freq: Every day | INTRAMUSCULAR | Status: DC

## 2021-11-01 MED ORDER — INSULIN ASPART 100 UNIT/ML IJ SOLN
4.0000 [IU] | Freq: Three times a day (TID) | INTRAMUSCULAR | Status: DC
  Administered 2021-11-01 – 2021-11-03 (×5): 4 [IU] via SUBCUTANEOUS

## 2021-11-01 MED ORDER — SODIUM CHLORIDE 0.9 % IV SOLN: INTRAVENOUS | Status: AC

## 2021-11-01 MED ORDER — HYDROCODONE-ACETAMINOPHEN 5-325 MG PO TABS
1.0000 | ORAL_TABLET | Freq: Three times a day (TID) | ORAL | Status: DC | PRN
  Administered 2021-11-01 – 2021-11-02 (×4): 1 via ORAL
  Filled 2021-11-01 (×4): qty 1

## 2021-11-01 MED ORDER — ONDANSETRON HCL 4 MG/2ML IJ SOLN
4.0000 mg | Freq: Four times a day (QID) | INTRAMUSCULAR | Status: DC | PRN
  Administered 2021-11-02 (×2): 4 mg via INTRAVENOUS
  Filled 2021-11-01 (×2): qty 2

## 2021-11-01 MED ORDER — ENOXAPARIN SODIUM 40 MG/0.4ML IJ SOSY
40.0000 mg | PREFILLED_SYRINGE | INTRAMUSCULAR | Status: DC
  Administered 2021-11-01 – 2021-11-03 (×3): 40 mg via SUBCUTANEOUS
  Filled 2021-11-01 (×3): qty 0.4

## 2021-11-01 MED ORDER — ADULT MULTIVITAMIN W/MINERALS CH
1.0000 | ORAL_TABLET | Freq: Every day | ORAL | Status: DC
  Administered 2021-11-01 – 2021-11-03 (×3): 1 via ORAL
  Filled 2021-11-01 (×3): qty 1

## 2021-11-01 MED ORDER — METFORMIN HCL 500 MG PO TABS
1000.0000 mg | ORAL_TABLET | Freq: Two times a day (BID) | ORAL | Status: DC
  Administered 2021-11-01 – 2021-11-02 (×3): 1000 mg via ORAL
  Filled 2021-11-01 (×3): qty 2

## 2021-11-01 MED ORDER — LACTATED RINGERS IV SOLN: INTRAVENOUS | Status: DC

## 2021-11-01 MED ORDER — INSULIN ASPART 100 UNIT/ML IJ SOLN
0.0000 [IU] | Freq: Three times a day (TID) | INTRAMUSCULAR | Status: DC
  Administered 2021-11-01: 8 [IU] via SUBCUTANEOUS
  Administered 2021-11-02: 15 [IU] via SUBCUTANEOUS
  Administered 2021-11-03: 11 [IU] via SUBCUTANEOUS
  Administered 2021-11-03: 2 [IU] via SUBCUTANEOUS

## 2021-11-01 NOTE — Discharge Instructions (Signed)

## 2021-11-01 NOTE — Progress Notes (Signed)
Shawn Bailey   DOB:[birth year]   PY#:[id number]   KNL#:[id number]  Oncology follow up note   Subjective: Patient is well-known to me, under my care for his liver cancer.  He is on palliative therapy with Tecentriq and bevacizumab.  He was admitted for new onset hyperglycemia. He is feeling better today.    Objective:  Vitals:   11/01/21 1856 11/01/21 2129  BP: 140/80 (!) 136/95  Pulse: 77 74  Resp: 18 18  Temp: 98.4 F (36.9 C) 98.1 F (36.7 C)  SpO2: 97% 99%    Body mass index is 20.97 kg/m.  Intake/Output Summary (Last 24 hours) at 11/01/2021 2137 Last data filed at 11/01/2021 1923 Gross per 24 hour  Intake 832.6 ml  Output 400 ml  Net 432.6 ml     Sclerae unicteric  Oropharynx clear  No peripheral adenopathy  Lungs clear -- no rales or rhonchi  Heart regular rate and rhythm  Abdomen soft and nontender   MSK no focal spinal tenderness, no peripheral edema  Neuro nonfocal    CBG (last 3)  Recent Labs    11/01/21 1204 11/01/21 1706 11/01/21 2131  GLUCAP 215* 291* 157*     Labs:   Urine Studies No results for input(s): UHGB, CRYS in the last 72 hours.  Invalid input(s): UACOL, UAPR, USPG, UPH, UTP, UGL, UKET, UBIL, UNIT, UROB, ULEU, UEPI, UWBC, URBC, UBAC, CAST, UCOM, BILUA  Basic Metabolic Panel: Recent Labs  Lab 10/31/21 1951 11/01/21 0300  NA 126* 133*  K 4.7 3.7  CL 92* 101  CO2 19* 25  GLUCOSE 536* 155*  BUN 25* 20  CREATININE 0.97 0.67  CALCIUM 10.0 9.1   GFR Estimated Creatinine Clearance: 63.6 mL/min (by C-G formula based on SCr of 0.67 mg/dL). Liver Function Tests: Recent Labs  Lab 10/31/21 1951  AST 62*  ALT 74*  ALKPHOS 63  BILITOT 1.6*  PROT 8.6*  ALBUMIN 4.1   No results for input(s): LIPASE, AMYLASE in the last 168 hours. No results for input(s): AMMONIA in the last 168 hours. Coagulation profile No results for input(s): INR, PROTIME in the last 168 hours.  CBC: Recent Labs  Lab 10/31/21 1951 11/01/21 0514  WBC  4.7 3.1*  NEUTROABS 3.4  --   HGB 14.4 12.4*  HCT 41.3 35.2*  MCV 90.2 90.7  PLT 94* 75*   Cardiac Enzymes: No results for input(s): CKTOTAL, CKMB, CKMBINDEX, TROPONINI in the last 168 hours. BNP: Invalid input(s): POCBNP CBG: Recent Labs  Lab 11/01/21 0557 11/01/21 0756 11/01/21 1204 11/01/21 1706 11/01/21 2131  GLUCAP 118* 152* 215* 291* 157*   D-Dimer No results for input(s): DDIMER in the last 72 hours. Hgb A1c Recent Labs    11/01/21 0514  HGBA1C 7.1*   Lipid Profile No results for input(s): CHOL, HDL, LDLCALC, TRIG, CHOLHDL, LDLDIRECT in the last 72 hours. Thyroid function studies No results for input(s): TSH, T4TOTAL, T3FREE, THYROIDAB in the last 72 hours.  Invalid input(s): FREET3 Anemia work up No results for input(s): VITAMINB12, FOLATE, FERRITIN, TIBC, IRON, RETICCTPCT in the last 72 hours. Microbiology Recent Results (from the past 240 hour(s))  Resp Panel by RT-PCR (Flu A&B, Covid) Nasopharyngeal Swab     Status: None   Collection Time: 10/31/21 10:58 PM   Specimen: Nasopharyngeal Swab; Nasopharyngeal(NP) swabs in vial transport medium  Result Value Ref Range Status   SARS Coronavirus 2 by RT PCR NEGATIVE NEGATIVE Final    Comment: (NOTE) SARS-CoV-2 target nucleic acids are NOT DETECTED.  The SARS-CoV-2 RNA is generally detectable in upper respiratory specimens during the acute phase of infection. The lowest concentration of SARS-CoV-2 viral copies this assay can detect is 138 copies/mL. A negative result does not preclude SARS-Cov-2 infection and should not be used as the sole basis for treatment or other patient management decisions. A negative result may occur with  improper specimen collection/handling, submission of specimen other than nasopharyngeal swab, presence of viral mutation(s) within the areas targeted by this assay, and inadequate number of viral copies(<138 copies/mL). A negative result must be combined with clinical observations,  patient history, and epidemiological information. The expected result is Negative.  Fact Sheet for Patients:  EntrepreneurPulse.com.au  Fact Sheet for Healthcare Providers:  IncredibleEmployment.be  This test is no t yet approved or cleared by the [place] FDA and  has been authorized for detection and/or diagnosis of SARS-CoV-2 by FDA under an Emergency Use Authorization (EUA). This EUA will remain  in effect (meaning this test can be used) for the duration of the COVID-19 declaration under Section 564(b)(1) of the Act, 21 U.S.C.section 360bbb-3(b)(1), unless the authorization is terminated  or revoked sooner.       Influenza A by PCR NEGATIVE NEGATIVE Final   Influenza B by PCR NEGATIVE NEGATIVE Final    Comment: (NOTE) The Xpert Xpress SARS-CoV-2/FLU/RSV plus assay is intended as an aid in the diagnosis of influenza from Nasopharyngeal swab specimens and should not be used as a sole basis for treatment. Nasal washings and aspirates are unacceptable for Xpert Xpress SARS-CoV-2/FLU/RSV testing.  Fact Sheet for Patients: EntrepreneurPulse.com.au  Fact Sheet for Healthcare Providers: IncredibleEmployment.be  This test is not yet approved or cleared by the [place] FDA and has been authorized for detection and/or diagnosis of SARS-CoV-2 by FDA under an Emergency Use Authorization (EUA). This EUA will remain in effect (meaning this test can be used) for the duration of the COVID-19 declaration under Section 564(b)(1) of the Act, 21 U.S.C. section 360bbb-3(b)(1), unless the authorization is terminated or revoked.  Performed at [facility], [place] [address], [place], [place] [address]       Studies:  DG Chest 1 View  Result Date: 10/31/2021 CLINICAL DATA:  Increasing weakness EXAM: PORTABLE CHEST 1 VIEW COMPARISON:  03/05/2021 FINDINGS: The heart size and mediastinal  contours are within normal limits. Both lungs are clear. The visualized skeletal structures are unremarkable. IMPRESSION: No active disease. Electronically Signed   By: Inez Catalina M.D.   On: 10/31/2021 22:09   CT Head Wo Contrast  Result Date: 10/31/2021 CLINICAL DATA:  Weakness, status post recent fall. EXAM: CT HEAD WITHOUT CONTRAST TECHNIQUE: Contiguous axial images were obtained from the base of the skull through the vertex without intravenous contrast. RADIATION DOSE REDUCTION: This exam was performed according to the departmental dose-optimization program which includes automated exposure control, adjustment of the mA and/or kV according to patient size and/or use of iterative reconstruction technique. COMPARISON:  None. FINDINGS: Brain: There is mild cerebral atrophy with widening of the extra-axial spaces and ventricular dilatation. There are areas of decreased attenuation within the white matter tracts of the supratentorial brain, consistent with microvascular disease changes. Vascular: No hyperdense vessel or unexpected calcification. Skull: Normal. Negative for fracture or focal lesion. Sinuses/Orbits: No acute finding. Other: None. IMPRESSION: 1. Generalized cerebral atrophy and chronic white matter ischemic changes without evidence of an acute intracranial abnormality. Electronically Signed   By: Virgina Norfolk M.D.   On: 10/31/2021 22:26    Assessment:  76 y.o. male   Hyperosmolar hyperglycemia nonketotic state Dehydration, improved  Orthostatic hypotension, resolved Hepatocellular carcinoma, on palliative chemotherapy    Plan:  -Not sure if he has new onset diabetes irradiated to his cancer treatment, probably not.  Management per primary team -he was scheduled for chemo treatment today, which I will hold it, and rescheduled to next week -I discussed his recent restaging CT scan from October 15, 2021, which showed continued response to treatment, no new lesions. -I called his  ex-wife and updated her. -I will follow-up after his discharge.   [name], MD 11/01/2021

## 2021-11-01 NOTE — Evaluation (Addendum)
Physical Therapy Evaluation Patient Details Name: Shawn Bailey MRN: [id number] DOB: 04/17/1946 Today's Date: 11/01/2021  History of Present Illness  Deandrew Hoecker is an 76 y.o. male presented to [facility] with decreased oral appetite for the past 5 days, increased fatigue and generalized weakness and felt tonight prior to admission hit his head, CT of the head in the ED showed no acute abnormalities but it did showed generalized cerebral atrophy, blood glucose in the ED was 530. 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    Clinical Impression  Karman Veney is 75 y.o. male admitted with above HPI and diagnosis. Patient is currently limited by functional impairments below (see PT problem list). Patient lives alone and is independent with mobility at baseline. He has assist from family/friends for transportation or as needed at home. Patient will benefit from continued skilled PT interventions to address impairments and progress independence with mobility. Acute PT will follow and progress as able.        Recommendations for follow up therapy are one component of a multi-disciplinary discharge planning process, led by the attending physician.  Recommendations may be updated based on patient status, additional functional criteria and insurance authorization.  Follow Up Recommendations No PT follow up    Assistance Recommended at Discharge Intermittent Supervision/Assistance  Patient can return home with the following  Assist for transportation;Assistance with cooking/housework    Equipment Recommendations None recommended by PT  Recommendations for Other Services       Functional Status Assessment Patient has had a recent decline in their functional status and demonstrates the ability to make significant improvements in function in a reasonable and predictable amount of time.     Precautions / Restrictions  Precautions Precautions: Fall Restrictions Weight Bearing Restrictions: No      Mobility  Bed Mobility Overal bed mobility: Modified Independent             General bed mobility comments: pt taking extra time, HOB slightly elevated    Transfers Overall transfer level: Needs assistance Equipment used: Rolling walker (2 wheels) Transfers: Sit to/from Stand Sit to Stand: Min guard;Supervision                Ambulation/Gait Ambulation/Gait assistance: Min guard;Supervision   Assistive device: Rolling walker (2 wheels)   Gait velocity: Chief Operating Officer    Modified Rankin (Stroke Patients Only)       Balance Overall balance assessment: Needs assistance Sitting-balance support: Feet supported Sitting balance-Leahy Scale: Good     Standing balance support: During functional activity;Bilateral upper extremity supported Standing balance-Leahy Scale: Fair                               Pertinent Vitals/Pain Pain Assessment: No/denies pain    Home Living Family/patient expects to be discharged to:: Private residence Living Arrangements: Alone Available Help at Discharge: Family;Available PRN/intermittently;Friend(s) (pt's ex-wife is a good friend and will help him if he needs anything) Type of Home: House Home Access: Level entry       Home Layout: One level Home Equipment: Cane - single Barista (2 wheels)      Prior Function Prior Level of Function : Independent/Modified Independent                     Hand  Dominance   Dominant Hand: Right    Extremity/Trunk Assessment   Upper Extremity Assessment Upper Extremity Assessment: Overall WFL for tasks assessed    Lower Extremity Assessment Lower Extremity Assessment: Overall WFL for tasks assessed    Cervical / Trunk Assessment Cervical / Trunk Assessment: Normal  Communication   Communication: No difficulties   Cognition Arousal/Alertness: Awake/alert Behavior During Therapy: WFL for tasks assessed/performed Overall Cognitive Status: Within Functional Limits for tasks assessed                                          General Comments      Exercises     Assessment/Plan    PT Assessment Patient needs continued PT services  PT Problem List Decreased strength;Decreased range of motion;Decreased activity tolerance;Decreased balance;Decreased mobility;Decreased safety awareness;Pain       PT Treatment Interventions DME instruction;Gait training;Stair training;Functional mobility training;Therapeutic activities;Therapeutic exercise;Balance training;Patient/family education    PT Goals (Current goals can be found in the Care Plan section)  Acute Rehab PT Goals PT Goal Formulation: With patient Time For Goal Achievement: 11/15/21 Potential to Achieve Goals: Good    Frequency 7X/week     Co-evaluation               AM-PAC PT "6 Clicks" Mobility  Outcome Measure Help needed turning from your back to your side while in a flat bed without using bedrails?: None Help needed moving from lying on your back to sitting on the side of a flat bed without using bedrails?: None Help needed moving to and from a bed to a chair (including a wheelchair)?: A Little Help needed standing up from a chair using your arms (e.g., wheelchair or bedside chair)?: A Little Help needed to walk in hospital room?: A Little Help needed climbing 3-5 steps with a railing? : A Little 6 Click Score: 20    End of Session Equipment Utilized During Treatment: Gait belt Activity Tolerance: Patient tolerated treatment well Patient left: in chair;with call bell/phone within reach Nurse Communication: Mobility status PT Visit Diagnosis: Muscle weakness (generalized) (M62.81);Difficulty in walking, not elsewhere classified (R26.2)    Time: 3235-5732 PT Time Calculation (min) (ACUTE ONLY): 21  min   Charges:   PT Evaluation $PT Eval Low Complexity: 1 Low          Verner Mould, DPT Acute Rehabilitation Services Office [phone] Pager [phone]   Jacques Navy 11/01/2021, 4:03 PM

## 2021-11-01 NOTE — Progress Notes (Signed)
[facility] PROGRESS NOTE    Progress Note  Shawn Bailey  OQH:[id number] DOB: 10-29-1945 DOA: 10/31/2021 PCP: [facility], [facility]     Brief Narrative:   Shawn Bailey is an 76 y.o. 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 He was started on sliding scale insulin IV fluids and her blood glucose this morning is 152.  Hypovolemic hyponatremia and pseudohyponatremia: With correction of blood glucose is about 133. Continue IV fluids.  Orthostatic hypotension: He relates very nicely that he was dizzy upon standing, he was started on IV fluids. He relates that his lightheadedness upon standing has resolved now.  Hepatocellular carcinoma ([place]) Follow-up with Dr. Annamaria Boots as an outpatient.  Elevated LFTs: Likely due to hepatocellular carcinoma and chemotherapy. Generalized weakness: PT OT has been consulted probably secondary to hypoglycemia and chemotherapy.  Elevated blood pressure without a diagnosis of hypertension: Blood pressure is improved this morning is 130/75 we will continue to monitor and follow-up with PCP as an outpatient.    DVT prophylaxis: lovenox Family Communication:none Status is: Observation  The patient remains OBS appropriate and will d/c before 2 midnights.       Code Status:     Code Status Orders  (From admission, onward)           Start     Ordered    11/01/21 0134  Full code  Continuous        11/01/21 0133           Code Status History     Date Active Date Inactive Code Status Order ID Comments User Context   03/05/2021 [id number] 03/08/2021 2047 Full Code [id number]  Ezekiel Slocumb, DO ED      Advance Directive Documentation    Flowsheet Row Most Recent Value  Type of Advance Directive Healthcare Power of [place]  Pre-existing out of facility DNR order (yellow form or pink MOST form) --  "MOST" Form in Place? --         IV Access:   Peripheral IV   Procedures and diagnostic studies:   DG Chest 1 View  Result Date: 10/31/2021 CLINICAL DATA:  Increasing weakness EXAM: PORTABLE CHEST 1 VIEW COMPARISON:  03/05/2021 FINDINGS: The heart size and mediastinal contours are within normal limits. Both lungs are clear. The visualized skeletal structures are unremarkable. IMPRESSION: No active disease. Electronically Signed   By: Inez Catalina M.D.   On: 10/31/2021 22:09   CT Head Wo Contrast  Result Date: 10/31/2021 CLINICAL DATA:  Weakness, status post recent fall. EXAM: CT HEAD WITHOUT CONTRAST TECHNIQUE: Contiguous axial images were obtained from the base of the skull through the vertex without intravenous contrast. RADIATION DOSE REDUCTION: This exam was performed according to the departmental dose-optimization program which includes automated exposure control, adjustment of the mA and/or kV according to patient size and/or use of iterative reconstruction technique. COMPARISON:  None. FINDINGS: Brain: There is mild cerebral atrophy with widening of the extra-axial spaces and  ventricular dilatation. There are areas of decreased attenuation within the white matter tracts of the supratentorial brain, consistent with microvascular disease changes. Vascular: No hyperdense vessel or unexpected calcification. Skull: Normal. Negative for fracture or focal lesion. Sinuses/Orbits: No acute finding. Other: None. IMPRESSION: 1. Generalized  cerebral atrophy and chronic white matter ischemic changes without evidence of an acute intracranial abnormality. Electronically Signed   By: Virgina Norfolk M.D.   On: 10/31/2021 22:26     Medical Consultants:   None.   Subjective:    Shawn Bailey feels better appetite has returned dizziness upon standing has resolved  Objective:    Vitals:   10/31/21 2100 10/31/21 2200 11/01/21 0152 11/01/21 0321  BP: (!) 160/69 (!) 149/84 130/75 136/72  Pulse: 86 78 70 65  Resp: 15 18 18 20   Temp:   98 F (36.7 C) 98.3 F (36.8 C)  TempSrc:   Oral Oral  SpO2: 95% 94% 97% 100%  Weight:      Height:       SpO2: 100 % O2 Flow Rate (L/min): 0 L/min   Intake/Output Summary (Last 24 hours) at 11/01/2021 0853 Last data filed at 11/01/2021 0423 Gross per 24 hour  Intake 40.67 ml  Output 100 ml  Net -59.33 ml   Filed Weights   10/31/21 1935  Weight: 57.2 kg    Exam: General exam: In no acute distress. Respiratory system: Good air movement and clear to auscultation. Cardiovascular system: S1 & S2 heard, RRR. No JVD. Gastrointestinal system: Abdomen is nondistended, soft and nontender.  Extremities: No pedal edema. Skin: No rashes, lesions or ulcers Psychiatry: Judgement and insight appear normal. Mood & affect appropriate.    Data Reviewed:    Labs: Basic Metabolic Panel: Recent Labs  Lab 10/31/21 1951 11/01/21 0300  NA 126* 133*  K 4.7 3.7  CL 92* 101  CO2 19* 25  GLUCOSE 536* 155*  BUN 25* 20  CREATININE 0.97 0.67  CALCIUM 10.0 9.1   GFR Estimated Creatinine Clearance: 63.6 mL/min (by C-G formula based on SCr of 0.67 mg/dL). Liver Function Tests: Recent Labs  Lab 10/31/21 1951  AST 62*  ALT 74*  ALKPHOS 63  BILITOT 1.6*  PROT 8.6*  ALBUMIN 4.1   No results for input(s): LIPASE, AMYLASE in the last 168 hours. No results for input(s): AMMONIA in the last 168 hours. Coagulation profile No results for input(s): INR, PROTIME in the last 168  hours. COVID-19 Labs  No results for input(s): DDIMER, FERRITIN, LDH, CRP in the last 72 hours.  Lab Results  Component Value Date   SARSCOV2NAA NEGATIVE 10/31/2021   [place] NEGATIVE 03/05/2021    CBC: Recent Labs  Lab 10/31/21 1951 11/01/21 0514  WBC 4.7 3.1*  NEUTROABS 3.4  --   HGB 14.4 12.4*  HCT 41.3 35.2*  MCV 90.2 90.7  PLT 94* 75*   Cardiac Enzymes: No results for input(s): CKTOTAL, CKMB, CKMBINDEX, TROPONINI in the last 168 hours. BNP (last 3 results) No results for input(s): PROBNP in the last 8760 hours. CBG: Recent Labs  Lab 10/31/21 2239 11/01/21 0255 11/01/21 0429 11/01/21 0557 11/01/21 0756  GLUCAP 484* 130* 101* 118* 152*   D-Dimer: No results for input(s): DDIMER in the last 72 hours. Hgb A1c: Recent Labs    11/01/21 0514  HGBA1C 7.1*   Lipid Profile: No results for input(s): CHOL, HDL, LDLCALC, TRIG, CHOLHDL, LDLDIRECT in the last 72 hours. Thyroid function studies: No results for input(s): TSH, T4TOTAL, T3FREE, THYROIDAB in  the last 72 hours.  Invalid input(s): FREET3 Anemia work up: No results for input(s): VITAMINB12, FOLATE, FERRITIN, TIBC, IRON, RETICCTPCT in the last 72 hours. Sepsis Labs: Recent Labs  Lab 10/31/21 1951 10/31/21 2250 11/01/21 0230 11/01/21 0514  WBC 4.7  --   --  3.1*  LATICACIDVEN  --  2.3* 2.3*  --    Microbiology Recent Results (from the past 240 hour(s))  Resp Panel by RT-PCR (Flu A&B, Covid) Nasopharyngeal Swab     Status: None   Collection Time: 10/31/21 10:58 PM   Specimen: Nasopharyngeal Swab; Nasopharyngeal(NP) swabs in vial transport medium  Result Value Ref Range Status   SARS Coronavirus 2 by RT PCR NEGATIVE NEGATIVE Final    Comment: (NOTE) SARS-CoV-2 target nucleic acids are NOT DETECTED.  The SARS-CoV-2 RNA is generally detectable in upper respiratory specimens during the acute phase of infection. The lowest concentration of SARS-CoV-2 viral copies this assay can detect is 138  copies/mL. A negative result does not preclude SARS-Cov-2 infection and should not be used as the sole basis for treatment or other patient management decisions. A negative result may occur with  improper specimen collection/handling, submission of specimen other than nasopharyngeal swab, presence of viral mutation(s) within the areas targeted by this assay, and inadequate number of viral copies(<138 copies/mL). A negative result must be combined with clinical observations, patient history, and epidemiological information. The expected result is Negative.  Fact Sheet for Patients:  EntrepreneurPulse.com.au  Fact Sheet for Healthcare Providers:  IncredibleEmployment.be  This test is no t yet approved or cleared by the [place] FDA and  has been authorized for detection and/or diagnosis of SARS-CoV-2 by FDA under an Emergency Use Authorization (EUA). This EUA will remain  in effect (meaning this test can be used) for the duration of the COVID-19 declaration under Section 564(b)(1) of the Act, 21 U.S.C.section 360bbb-3(b)(1), unless the authorization is terminated  or revoked sooner.       Influenza A by PCR NEGATIVE NEGATIVE Final   Influenza B by PCR NEGATIVE NEGATIVE Final    Comment: (NOTE) The Xpert Xpress SARS-CoV-2/FLU/RSV plus assay is intended as an aid in the diagnosis of influenza from Nasopharyngeal swab specimens and should not be used as a sole basis for treatment. Nasal washings and aspirates are unacceptable for Xpert Xpress SARS-CoV-2/FLU/RSV testing.  Fact Sheet for Patients: EntrepreneurPulse.com.au  Fact Sheet for Healthcare Providers: IncredibleEmployment.be  This test is not yet approved or cleared by the [place] FDA and has been authorized for detection and/or diagnosis of SARS-CoV-2 by FDA under an Emergency Use Authorization (EUA). This EUA will remain in effect (meaning  this test can be used) for the duration of the COVID-19 declaration under Section 564(b)(1) of the Act, 21 U.S.C. section 360bbb-3(b)(1), unless the authorization is terminated or revoked.  Performed at [facility], [place] [address], [place], [place] [address]      Medications:    enoxaparin (LOVENOX) injection  40 mg Subcutaneous Q24H   insulin aspart  0-9 Units Subcutaneous TID WC & HS   Continuous Infusions:  lactated ringers 100 mL/hr at 11/01/21 0241      LOS: 0 days   Charlynne Cousins  [facility]  11/01/2021, 8:53 AM

## 2021-11-01 NOTE — Progress Notes (Signed)
Initial Nutrition Assessment  DOCUMENTATION CODES:   Severe malnutrition in context of chronic illness  INTERVENTION:   -Ensure Enlive po daily each supplement provides 350 kcal and 20 grams of protein   -Ensure MAX Protein po BID, each supplement provides 150 kcal and 30 grams of protein   -Plate Method for diabetes provided in discharge instructions   NUTRITION DIAGNOSIS:   Severe Malnutrition related to chronic illness, cancer and cancer related treatments as evidenced by energy intake < or equal to 75% for > or equal to 1 month, moderate fat depletion, severe muscle depletion.  GOAL:   Patient will meet greater than or equal to 90% of their needs  MONITOR:   PO intake, Supplement acceptance, Labs, Weight trends, I & O's  REASON FOR ASSESSMENT:   Malnutrition Screening Tool, Other (Comment) (Cancer Center reached out about pt)    ASSESSMENT:   76 y.o. 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  Patient in room, states he has not been eating well lately. He states this is d/t chemotherapy. He was supposed to receive his next dose today but was admitted d/t weakness and falling. Pt states he cannot walk. Denies issues with swallowing or chewing at this time.   Pt consumed 100% of his breakfast this morning of eggs and toast. States he is concerned by his elevated blood sugars. States when he does not eat, he consumes 3-4 Ensure Plus daily (1050-1400 kcals, 48-64g protein, 47g CHO per bottle). Receives cases from the [employer] Pt is interested in trying Ensure Max  given it's high protein content. Will keep 1 regular Ensure given increased needs in calories. Pt interested in having information about carbohydrate foods. Placed handout in discharge instructions. Further educational needs can be addressed outpatient.   Per patient, he weighed ~155-160 lbs prior to cancer diagnosis. Pt states he feels that his weight has been decreasing lately. Per weight records, weights appear stable. Pt does report he was not weighed so will need an updated weight to assess weight status. Pt is still observation status so if changes to admission will get weight in AM.  Medications reviewed.  Labs reviewed: CBGs: 101-215 Low Na  NUTRITION - FOCUSED PHYSICAL EXAM:  Flowsheet Row Most Recent Value  Orbital Region Mild depletion  Upper Arm Region Severe depletion  Thoracic and Lumbar Region Unable to assess  Buccal Region Moderate depletion  Temple Region Moderate depletion  Clavicle Bone Region Moderate depletion  Clavicle and Acromion Bone Region Moderate depletion  Scapular Bone Region Moderate depletion  Dorsal Hand Moderate depletion  Patellar Region Severe depletion  Anterior Thigh Region Severe depletion  Posterior Calf Region Severe depletion  Edema (RD Assessment) None  Hair Unable to assess  [no hair]  Eyes Reviewed  Mouth Reviewed  Skin Reviewed  Nails Reviewed       Diet Order:   Diet Order             Diet heart healthy/carb modified Room service appropriate? Yes; Fluid consistency: Thin  Diet effective now  EDUCATION NEEDS:   Education needs have been addressed  Skin:  Skin Assessment: Reviewed RN Assessment  Last BM:  1/15  Height:   Ht Readings from Last 1 Encounters:  10/31/21 5\' 5"  (1.651 m)    Weight:   Wt Readings from Last 1 Encounters:  10/31/21 57.2 kg    BMI:  Body mass index is 20.97 kg/m.  Estimated Nutritional Needs:   Kcal:  1850-2050  Protein:  90-100g  Fluid:   2L/day  Clayton Bibles, MS, RD, LDN Inpatient Clinical Dietitian Contact information available via Amion

## 2021-11-02 ENCOUNTER — Telehealth: Payer: Self-pay | Admitting: Hematology

## 2021-11-02 ENCOUNTER — Inpatient Hospital Stay (HOSPITAL_COMMUNITY): Payer: Medicare Other

## 2021-11-02 DIAGNOSIS — E1165 Type 2 diabetes mellitus with hyperglycemia: Secondary | ICD-10-CM

## 2021-11-02 LAB — CBC WITH DIFFERENTIAL/PLATELET
Abs Immature Granulocytes: 0.01 10*3/uL (ref 0.00–0.07)
Basophils Absolute: 0 10*3/uL (ref 0.0–0.1)
Basophils Relative: 0 %
Eosinophils Absolute: 0 10*3/uL (ref 0.0–0.5)
Eosinophils Relative: 1 %
HCT: 38.5 % — ABNORMAL LOW (ref 39.0–52.0)
Hemoglobin: 13.6 g/dL (ref 13.0–17.0)
Immature Granulocytes: 0 %
Lymphocytes Relative: 15 %
Lymphs Abs: 0.8 10*3/uL (ref 0.7–4.0)
MCH: 32.2 pg (ref 26.0–34.0)
MCHC: 35.3 g/dL (ref 30.0–36.0)
MCV: 91.2 fL (ref 80.0–100.0)
Monocytes Absolute: 0.5 10*3/uL (ref 0.1–1.0)
Monocytes Relative: 11 %
Neutro Abs: 3.6 10*3/uL (ref 1.7–7.7)
Neutrophils Relative %: 73 %
Platelets: 75 10*3/uL — ABNORMAL LOW (ref 150–400)
RBC: 4.22 MIL/uL (ref 4.22–5.81)
RDW: 12.1 % (ref 11.5–15.5)
WBC: 5 10*3/uL (ref 4.0–10.5)
nRBC: 0 % (ref 0.0–0.2)

## 2021-11-02 LAB — BASIC METABOLIC PANEL
Anion gap: 6 (ref 5–15)
BUN: 22 mg/dL (ref 8–23)
CO2: 23 mmol/L (ref 22–32)
Calcium: 8.7 mg/dL — ABNORMAL LOW (ref 8.9–10.3)
Chloride: 100 mmol/L (ref 98–111)
Creatinine, Ser: 0.63 mg/dL (ref 0.61–1.24)
GFR, Estimated: >60 mL/min (ref >60)
Glucose, Bld: 176 mg/dL — ABNORMAL HIGH (ref 70–99)
Potassium: 3.4 mmol/L — ABNORMAL LOW (ref 3.5–5.1)
Sodium: 129 mmol/L — ABNORMAL LOW (ref 135–145)

## 2021-11-02 LAB — GLUCOSE, CAPILLARY
Glucose-Capillary: 396 mg/dL — ABNORMAL HIGH (ref 70–99)
Glucose-Capillary: 109 mg/dL — ABNORMAL HIGH (ref 70–99)
Glucose-Capillary: 113 mg/dL — ABNORMAL HIGH (ref 70–99)
Glucose-Capillary: 174 mg/dL — ABNORMAL HIGH (ref 70–99)

## 2021-11-02 MED ORDER — OXYCODONE-ACETAMINOPHEN 7.5-325 MG PO TABS
2.0000 | ORAL_TABLET | ORAL | Status: DC | PRN
  Administered 2021-11-02 – 2021-11-03 (×2): 2 via ORAL
  Filled 2021-11-02 (×2): qty 2

## 2021-11-02 MED ORDER — IOHEXOL 300 MG/ML  SOLN
80.0000 mL | Freq: Once | INTRAMUSCULAR | Status: AC | PRN
  Administered 2021-11-02: 80 mL via INTRAVENOUS

## 2021-11-02 MED ORDER — LIVING WELL WITH DIABETES BOOK - IN SPANISH
Freq: Once | Status: AC
  Filled 2021-11-02: qty 1

## 2021-11-02 MED ORDER — IOHEXOL 9 MG/ML PO SOLN
500.0000 mL | ORAL | Status: AC
  Administered 2021-11-02: 500 mL via ORAL

## 2021-11-02 MED ORDER — LIVING WELL WITH DIABETES BOOK
Freq: Once | Status: AC
  Filled 2021-11-02: qty 1

## 2021-11-02 MED ORDER — SODIUM CHLORIDE 0.9 % IV BOLUS
1000.0000 mL | Freq: Once | INTRAVENOUS | Status: AC
  Administered 2021-11-02: 1000 mL via INTRAVENOUS

## 2021-11-02 MED ORDER — GLIPIZIDE 5 MG PO TABS
5.0000 mg | ORAL_TABLET | Freq: Every day | ORAL | Status: DC
  Administered 2021-11-02 – 2021-11-03 (×2): 5 mg via ORAL
  Filled 2021-11-02 (×2): qty 1

## 2021-11-02 MED ORDER — POLYETHYLENE GLYCOL 3350 17 G PO PACK
17.0000 g | PACK | Freq: Two times a day (BID) | ORAL | Status: AC
  Administered 2021-11-02: 17 g via ORAL
  Filled 2021-11-02 (×2): qty 1

## 2021-11-02 NOTE — Progress Notes (Addendum)
[facility] PROGRESS NOTE    Progress Note  Shawn Bailey  RKY:[id number] DOB: [birth year] DOA: 10/31/2021 PCP: [facility], [facility]     Brief Narrative:   Shawn Bailey is an 76 y.o. 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 His weight has not changed he has not been started on any new medication or changes in his medication.  Question of his hyperglycemia is due to his chemotherapy. Blood glucose was improved at 150, Bg now has shot up to 400. I will go ahead and start him on metformin and glipizide continue sliding scale.  New onset anorexia and left flank pain: He relates he feels nauseated with left flank pain.  Able to ambulate but is painful. He relates he feels nauseated with no appetite. Straight leg raise is negative external rotation of the hip is intact. Relates his narcotics are working for his pain.   Check a CBC with differential and a basic metabolic panel will observe for 24 hours. Repeat a CT scan of the abdomen and pelvis to rule out metastatic disease versus renal stone  Hypovolemic hyponatremia and pseudohyponatremia: With correction of blood glucose is about 133. KVO IV fluids now resolved.  Orthostatic hypotension: He relates very nicely that he was dizzy upon standing, he was started on IV fluids. He relates that his  lightheadedness upon standing has resolved now. Likely due to hyperglycemia  Hepatocellular carcinoma [facility]) Follow-up with Dr. Annamaria Boots as an outpatient.  Elevated LFTs: Likely due to hepatocellular carcinoma and chemotherapy. Generalized weakness: PT OT has been consulted probably secondary to hypoglycemia and chemotherapy.  Elevated blood pressure without a diagnosis of hypertension: Blood pressure is improved this morning is 130/75 we will continue to monitor and follow-up with PCP as an outpatient.    DVT prophylaxis: lovenox Family Communication:none Status is: Observation  The patient remains OBS appropriate and will d/c before 2 midnights.       Code Status:     Code Status Orders  (From admission, onward)           Start     Ordered   11/01/21 0134  Full code  Continuous        11/01/21 0133           Code Status History     Date Active Date Inactive Code Status Order ID Comments User Context   03/05/2021 3151 03/08/2021 2047 Full Code [id number]  Ezekiel Slocumb, DO ED      Advance Directive Documentation    Flowsheet Row Most Recent Value  Type of Advance Directive Healthcare Power of Attorney  Pre-existing out of facility DNR order (yellow form or pink MOST form) --  "MOST" Form in Place? --         IV Access:   Peripheral IV   Procedures and diagnostic studies:   DG Chest 1 View  Result Date: 10/31/2021 CLINICAL DATA:  Increasing weakness EXAM: PORTABLE CHEST  1 VIEW COMPARISON:  03/05/2021 FINDINGS: The heart size and mediastinal contours are within normal limits. Both lungs are clear. The visualized skeletal structures are unremarkable. IMPRESSION: No active disease. Electronically Signed   By: Inez Catalina M.D.   On: 10/31/2021 22:09   CT Head Wo Contrast  Result Date: 10/31/2021 CLINICAL DATA:  Weakness, status post recent fall. EXAM: CT HEAD WITHOUT CONTRAST TECHNIQUE: Contiguous axial images were obtained from the base of the  skull through the vertex without intravenous contrast. RADIATION DOSE REDUCTION: This exam was performed according to the departmental dose-optimization program which includes automated exposure control, adjustment of the mA and/or kV according to patient size and/or use of iterative reconstruction technique. COMPARISON:  None. FINDINGS: Brain: There is mild cerebral atrophy with widening of the extra-axial spaces and ventricular dilatation. There are areas of decreased attenuation within the white matter tracts of the supratentorial brain, consistent with microvascular disease changes. Vascular: No hyperdense vessel or unexpected calcification. Skull: Normal. Negative for fracture or focal lesion. Sinuses/Orbits: No acute finding. Other: None. IMPRESSION: 1. Generalized cerebral atrophy and chronic white matter ischemic changes without evidence of an acute intracranial abnormality. Electronically Signed   By: Virgina Norfolk M.D.   On: 10/31/2021 22:26     Medical Consultants:   None.   Subjective:    Shawn Bailey relates no appetite and feels worse this morning with some flank pain and nausea he relates his pain goes down his leg.  Objective:    Vitals:   11/01/21 1039 11/01/21 1856 11/01/21 2129 11/02/21 0615  BP: (!) 144/86 140/80 (!) 136/95 (!) 152/84  Pulse: 72 77 74 87  Resp: 18 18 18 18   Temp: 97.8 F (36.6 C) 98.4 F (36.9 C) 98.1 F (36.7 C) 97.7 F (36.5 C)  TempSrc: Oral Oral Oral Oral  SpO2: 100% 97% 99% 98%  Weight:      Height:       SpO2: 98 % O2 Flow Rate (L/min): 0 L/min   Intake/Output Summary (Last 24 hours) at 11/02/2021 1040 Last data filed at 11/02/2021 0700 Gross per 24 hour  Intake 1108.64 ml  Output 600 ml  Net 508.64 ml    Filed Weights   10/31/21 1935  Weight: 57.2 kg    Exam: General exam: In no acute distress. Respiratory system: Good air movement and clear to auscultation. Cardiovascular system: S1 & S2 heard, RRR. No  JVD. Gastrointestinal system: Abdomen is nondistended, soft and nontender.  Extremities: Straight leg raise of legs is negative external rotation of the hip is intact Skin: No rashes, lesions or ulcers Psychiatry: Judgement and insight appear normal. Mood & affect appropriate.   Data Reviewed:    Labs: Basic Metabolic Panel: Recent Labs  Lab 10/31/21 1951 11/01/21 0300  NA 126* 133*  K 4.7 3.7  CL 92* 101  CO2 19* 25  GLUCOSE 536* 155*  BUN 25* 20  CREATININE 0.97 0.67  CALCIUM 10.0 9.1    GFR Estimated Creatinine Clearance: 63.6 mL/min (by C-G formula based on SCr of 0.67 mg/dL). Liver Function Tests: Recent Labs  Lab 10/31/21 1951  AST 62*  ALT 74*  ALKPHOS 63  BILITOT 1.6*  PROT 8.6*  ALBUMIN 4.1    No results for input(s): LIPASE, AMYLASE in the last 168 hours. No results for input(s): AMMONIA in the last 168 hours. Coagulation profile No results for input(s): INR, PROTIME in the last 168 hours. COVID-19 Labs  No results for input(s): DDIMER, FERRITIN, LDH, CRP in  the last 72 hours.  Lab Results  Component Value Date   SARSCOV2NAA NEGATIVE 10/31/2021   [place] NEGATIVE 03/05/2021    CBC: Recent Labs  Lab 10/31/21 1951 11/01/21 0514  WBC 4.7 3.1*  NEUTROABS 3.4  --   HGB 14.4 12.4*  HCT 41.3 35.2*  MCV 90.2 90.7  PLT 94* 75*    Cardiac Enzymes: No results for input(s): CKTOTAL, CKMB, CKMBINDEX, TROPONINI in the last 168 hours. BNP (last 3 results) No results for input(s): PROBNP in the last 8760 hours. CBG: Recent Labs  Lab 11/01/21 0756 11/01/21 1204 11/01/21 1706 11/01/21 2131 11/02/21 0759  GLUCAP 152* 215* 291* 157* 396*    D-Dimer: No results for input(s): DDIMER in the last 72 hours. Hgb A1c: Recent Labs    11/01/21 0514  HGBA1C 7.1*    Lipid Profile: No results for input(s): CHOL, HDL, LDLCALC, TRIG, CHOLHDL, LDLDIRECT in the last 72 hours. Thyroid function studies: No results for input(s): TSH, T4TOTAL,  T3FREE, THYROIDAB in the last 72 hours.  Invalid input(s): FREET3 Anemia work up: No results for input(s): VITAMINB12, FOLATE, FERRITIN, TIBC, IRON, RETICCTPCT in the last 72 hours. Sepsis Labs: Recent Labs  Lab 10/31/21 1951 10/31/21 2250 11/01/21 0230 11/01/21 0514  WBC 4.7  --   --  3.1*  LATICACIDVEN  --  2.3* 2.3*  --     Microbiology Recent Results (from the past 240 hour(s))  Resp Panel by RT-PCR (Flu A&B, Covid) Nasopharyngeal Swab     Status: None   Collection Time: 10/31/21 10:58 PM   Specimen: Nasopharyngeal Swab; Nasopharyngeal(NP) swabs in vial transport medium  Result Value Ref Range Status   SARS Coronavirus 2 by RT PCR NEGATIVE NEGATIVE Final    Comment: (NOTE) SARS-CoV-2 target nucleic acids are NOT DETECTED.  The SARS-CoV-2 RNA is generally detectable in upper respiratory specimens during the acute phase of infection. The lowest concentration of SARS-CoV-2 viral copies this assay can detect is 138 copies/mL. A negative result does not preclude SARS-Cov-2 infection and should not be used as the sole basis for treatment or other patient management decisions. A negative result may occur with  improper specimen collection/handling, submission of specimen other than nasopharyngeal swab, presence of viral mutation(s) within the areas targeted by this assay, and inadequate number of viral copies(<138 copies/mL). A negative result must be combined with clinical observations, patient history, and epidemiological information. The expected result is Negative.  Fact Sheet for Patients:  EntrepreneurPulse.com.au  Fact Sheet for Healthcare Providers:  IncredibleEmployment.be  This test is no t yet approved or cleared by the [place] FDA and  has been authorized for detection and/or diagnosis of SARS-CoV-2 by FDA under an Emergency Use Authorization (EUA). This EUA will remain  in effect (meaning this test can be used) for the  duration of the COVID-19 declaration under Section 564(b)(1) of the Act, 21 U.S.C.section 360bbb-3(b)(1), unless the authorization is terminated  or revoked sooner.       Influenza A by PCR NEGATIVE NEGATIVE Final   Influenza B by PCR NEGATIVE NEGATIVE Final    Comment: (NOTE) The Xpert Xpress SARS-CoV-2/FLU/RSV plus assay is intended as an aid in the diagnosis of influenza from Nasopharyngeal swab specimens and should not be used as a sole basis for treatment. Nasal washings and aspirates are unacceptable for Xpert Xpress SARS-CoV-2/FLU/RSV testing.  Fact Sheet for Patients: EntrepreneurPulse.com.au  Fact Sheet for Healthcare Providers: IncredibleEmployment.be  This test is not yet approved or cleared by the [place] and  has been authorized for detection and/or diagnosis of SARS-CoV-2 by FDA under an Emergency Use Authorization (EUA). This EUA will remain in effect (meaning this test can be used) for the duration of the COVID-19 declaration under Section 564(b)(1) of the Act, 21 U.S.C. section 360bbb-3(b)(1), unless the authorization is terminated or revoked.  Performed at [facility], [place] [address] [place], [place] [address]      Medications:    enoxaparin (LOVENOX) injection  40 mg Subcutaneous Q24H   feeding supplement  237 mL Oral Q24H   glipiZIDE  5 mg Oral QAC breakfast   insulin aspart  0-15 Units Subcutaneous TID WC   insulin aspart  0-5 Units Subcutaneous QHS   insulin aspart  4 Units Subcutaneous TID WC   metFORMIN  1,000 mg Oral BID WC   multivitamin with minerals  1 tablet Oral Daily   Ensure Max Protein  11 oz Oral BID   Continuous Infusions:      LOS: 1 day   Charlynne Cousins  [facility]  11/02/2021, 10:40 AM

## 2021-11-02 NOTE — Telephone Encounter (Signed)
Sch per 1/17 inbasket, pt ex wife/friend-aware

## 2021-11-03 LAB — BASIC METABOLIC PANEL
Anion gap: 13 (ref 5–15)
BUN: 16 mg/dL (ref 8–23)
CO2: 17 mmol/L — ABNORMAL LOW (ref 22–32)
Calcium: 8.6 mg/dL — ABNORMAL LOW (ref 8.9–10.3)
Chloride: 99 mmol/L (ref 98–111)
Creatinine, Ser: 0.75 mg/dL (ref 0.61–1.24)
GFR, Estimated: >60 mL/min (ref >60)
Glucose, Bld: 353 mg/dL — ABNORMAL HIGH (ref 70–99)
Potassium: 3.9 mmol/L (ref 3.5–5.1)
Sodium: 129 mmol/L — ABNORMAL LOW (ref 135–145)

## 2021-11-03 LAB — CBC WITH DIFFERENTIAL/PLATELET
Abs Immature Granulocytes: 0.01 10*3/uL (ref 0.00–0.07)
Basophils Absolute: 0 10*3/uL (ref 0.0–0.1)
Basophils Relative: 0 %
Eosinophils Absolute: 0 10*3/uL (ref 0.0–0.5)
Eosinophils Relative: 2 %
HCT: 39.7 % (ref 39.0–52.0)
Hemoglobin: 13.5 g/dL (ref 13.0–17.0)
Immature Granulocytes: 0 %
Lymphocytes Relative: 17 %
Lymphs Abs: 0.5 10*3/uL — ABNORMAL LOW (ref 0.7–4.0)
MCH: 31.6 pg (ref 26.0–34.0)
MCHC: 34 g/dL (ref 30.0–36.0)
MCV: 93 fL (ref 80.0–100.0)
Monocytes Absolute: 0.2 10*3/uL (ref 0.1–1.0)
Monocytes Relative: 9 %
Neutro Abs: 2 10*3/uL (ref 1.7–7.7)
Neutrophils Relative %: 72 %
Platelets: 68 10*3/uL — ABNORMAL LOW (ref 150–400)
RBC: 4.27 MIL/uL (ref 4.22–5.81)
RDW: 12.3 % (ref 11.5–15.5)
WBC: 2.7 10*3/uL — ABNORMAL LOW (ref 4.0–10.5)
nRBC: 0 % (ref 0.0–0.2)

## 2021-11-03 LAB — MAGNESIUM: Magnesium: 1.8 mg/dL (ref 1.7–2.4)

## 2021-11-03 LAB — GLUCOSE, CAPILLARY
Glucose-Capillary: 333 mg/dL — ABNORMAL HIGH (ref 70–99)
Glucose-Capillary: 121 mg/dL — ABNORMAL HIGH (ref 70–99)
Glucose-Capillary: 74 mg/dL (ref 70–99)

## 2021-11-03 MED ORDER — METFORMIN HCL 500 MG PO TABS: 500.0000 mg | ORAL_TABLET | Freq: Two times a day (BID) | ORAL | Status: DC

## 2021-11-03 MED ORDER — INSULIN ASPART 100 UNIT/ML FLEXPEN
2.0000 [IU] | PEN_INJECTOR | Freq: Three times a day (TID) | SUBCUTANEOUS | 5 refills | Status: DC
Start: 2021-11-03 — End: 2021-11-04

## 2021-11-03 MED ORDER — INSULIN STARTER KIT- PEN NEEDLES (ENGLISH)
1.0000 | Freq: Once | Status: AC
  Administered 2021-11-03: 1
  Filled 2021-11-03: qty 1

## 2021-11-03 MED ORDER — INSULIN ASPART 100 UNIT/ML FLEXPEN: 2.0000 [IU] | PEN_INJECTOR | Freq: Three times a day (TID) | SUBCUTANEOUS | 5 refills | Status: DC

## 2021-11-03 MED ORDER — METFORMIN HCL 500 MG PO TABS: 500.0000 mg | ORAL_TABLET | Freq: Two times a day (BID) | ORAL | 3 refills | Status: DC

## 2021-11-03 MED ORDER — GLIPIZIDE 5 MG PO TABS: 5.0000 mg | ORAL_TABLET | Freq: Every day | ORAL | 3 refills | Status: DC

## 2021-11-03 MED ORDER — BLOOD GLUCOSE METER KIT: PACK | 0 refills | Status: DC

## 2021-11-03 MED ORDER — "PEN NEEDLES 3/16"" 31G X 5 MM MISC": 1.0000 | Freq: Three times a day (TID) | 3 refills | Status: AC

## 2021-11-03 NOTE — Progress Notes (Addendum)
Inpatient Diabetes Program Recommendations  AACE/ADA: New Consensus Statement on Inpatient Glycemic Control (2015)  Target Ranges:  Prepandial:   less than 140 mg/dL      Peak postprandial:   less than 180 mg/dL (1-2 hours)      Critically ill patients:  140 - 180 mg/dL   Lab Results  Component Value Date   GLUCAP 333 (H) 11/03/2021   HGBA1C 7.1 (H) 11/01/2021    Review of Glycemic Control  Diabetes history: New onset DM Outpatient Diabetes medications: None Current orders for Inpatient glycemic control: Novolog 0-15 units TID with meals and 0-5 HS + 4 units TID, metformin 500 mg BID, glipizide 5 mg before breakfast  HgbA1C - 7.1% CBG 353 this am. FBS 333. Eating approx 80% meal  Inpatient Diabetes Program Recommendations:    Consider adding Semglee 8 units Q24H.  Spoke with pt at bedside late yesterday afternoon about this new diagnosis of DM. Discussed going home on oral agents such as metformin and glipizide. Pt will need to get meter to check blood sugars at least 1-2x/day to start with. May need small amount of insulin if blood sugars stay elevated > 180. Discussed HgbA1C of 7.1% and average blood sugar of 160. Discussed how steroids affect blood sugar control. Also discussed diet, exercise and stress management and each plays a role in glucose management. Stressed importance of controlling blood sugars to prevent short and long-term complications. Pt seems very motivated to control his blood sugars. Have ordered Living Well book and encouraged pt to read. Will check back with him this afternoon.  Follow glucose trends.  Thank you. Lorenda Peck, RD, LDN, CDE Inpatient Diabetes Coordinator [phone]   Addendum:  Educated patient on insulin pen use at home. Reviewed contents of insulin flexpen starter kit. Reviewed all steps if insulin pen including attachment of needle, 2-unit air shot, dialing up dose, giving injection, removing needle, disposal of sharps, storage of  unused insulin, disposal of insulin etc. Patient able to provide successful return demonstration. Also reviewed troubleshooting with insulin pen. MD to give patient Rxs for insulin pens and insulin pen needles.   Discharge meds:  Novolog 2-15 units TID, metformin 500 mg BID, glipizide 5 mg QAM

## 2021-11-03 NOTE — Discharge Summary (Signed)
Physician Discharge Summary   Shawn Bailey XLK:[id number] DOB: 1946/06/18 DOA: 10/31/2021  PCP: [facility]  Admit date: 10/31/2021 Discharge date: 11/03/2021   Admitted From: Home Disposition:  Home Discharging physician: Dwyane Dee, MD  Recommendations for Outpatient Follow-up:  May need insulin adjustment at follow up Needs ongoing diet education and general DM management  Home Health: RN Equipment/Devices: n/a  Discharge Condition: stable CODE STATUS: Full Diet recommendation:  Diet Orders (From admission, onward)     Start     Ordered   11/03/21 0000  Diet - low sodium heart healthy        11/03/21 1233   11/03/21 0000  Diet Carb Modified        11/03/21 1233   11/01/21 0134  Diet heart healthy/carb modified Room service appropriate? Yes; Fluid consistency: Thin  Diet effective now       Question Answer Comment  Diet-HS Snack? Nothing   Room service appropriate? Yes   Fluid consistency: Thin      11/01/21 0133            Hospital Course:  Shawn Bailey is an 76 y.o. 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 He was found to have elevated glucose on work-up and new diagnosis diabetes after further work-up during hospitalization. He was started on metformin, glipizide, and sliding scale insulin. He underwent education with diabetes coordinator as well as registered dietitian prior to discharge.  Home health RN was also arranged to help facilitate further education with patient at home regarding diabetes management.   The patient's chronic medical conditions were treated accordingly per the patient's home medication regimen except as noted.  On day of discharge, patient was felt deemed stable for discharge. Patient/family member advised to call PCP or come back to ER  if needed.   Principal Diagnosis: Hyperosmolar hyperglycemic state (HHS) [facility])  Discharge Diagnoses: Principal Problem:   Hyperosmolar hyperglycemic state (HHS) ([place]) Active Problems:   Hepatocellular carcinoma (HCC)   Generalized weakness   Blood pressure elevated without history of HTN   Protein-calorie malnutrition, severe   Hyperglycemia   Discharge Instructions     Diet - low sodium heart healthy   Complete by: As directed    Diet Carb Modified   Complete by: As directed    Increase activity slowly   Complete by: As directed       Allergies as of 11/03/2021   No Known Allergies      Medication List     TAKE these medications    blood glucose meter kit and supplies Dispense based on patient and insurance preference. Use up to four times daily as directed. (FOR ICD-10 E10.9, E11.9).   glipiZIDE 5 MG tablet Commonly known as: GLUCOTROL Take 1 tablet (5 mg total) by mouth daily before breakfast. Start taking on: November 04, 2021   HYDROcodone-acetaminophen 5-325 MG tablet Commonly known as: Norco Take 1 tablet by mouth every 8 (eight) hours as needed for moderate pain.   insulin aspart 100 UNIT/ML FlexPen Commonly known as: NOVOLOG Inject 2-15 Units into the skin 3 (three) times daily with meals. Glucose 121 - 150: 2 units, Glucose 151 - 200: 3 units, Glucose 201 - 250: 5 units, Glucose 251 - 300: 8 units, Glucose 301 - 350: 11 units, Glucose 351 - 400: 15 units, Glucose > 400, call MD   metFORMIN  500 MG tablet Commonly known as: GLUCOPHAGE Take 1 tablet (500 mg total) by mouth 2 (two) times daily with a meal. Start taking on: November 05, 2021   multivitamin with minerals Tabs tablet Take 1 tablet by mouth daily.   ondansetron 8 MG tablet Commonly known as: ZOFRAN Take 1 tablet (8 mg total) by mouth every 8 (eight) hours as needed for nausea or vomiting.   Pen Needles 3/16" 31G X 5 MM Misc 1 each by Does not apply route with breakfast, with lunch, and  with evening meal.   polyethylene glycol 17 g packet Commonly known as: MIRALAX / GLYCOLAX Take 17 g by mouth daily as needed for mild constipation.   prochlorperazine 10 MG tablet Commonly known as: COMPAZINE Take 1 tablet (10 mg total) by mouth every 6 (six) hours as needed for nausea or vomiting.   senna 8.6 MG Tabs tablet Commonly known as: SENOKOT Take 1 tablet (8.6 mg total) by mouth 2 (two) times daily. What changed:  when to take this reasons to take this   vitamin B-12 100 MCG tablet Commonly known as: CYANOCOBALAMIN Take 100 mcg by mouth daily.   vitamin C 100 MG tablet Take 100 mg by mouth daily.        Follow-up Information     [facility] Follow up.   Specialty: Home Health Services Why: For home health nurse to visit you at home. Contact information: [place] [address] [phone]                No Known Allergies  Consultations:   Discharge Exam: BP 93/78 (BP Location: Right Arm)    Pulse 83    Temp 98.1 F (36.7 C) (Oral)    Resp 16    Ht 5' 5"  (1.651 m)    Wt 57.2 kg    SpO2 96%    BMI 20.97 kg/m  Physical Exam Constitutional:      General: He is not in acute distress.    Appearance: Normal appearance.  HENT:     Head: Normocephalic and atraumatic.     Mouth/Throat:     Mouth: Mucous membranes are moist.  Eyes:     Extraocular Movements: Extraocular movements intact.  Cardiovascular:     Rate and Rhythm: Normal rate and regular rhythm.     Heart sounds: Normal heart sounds.  Pulmonary:     Effort: Pulmonary effort is normal. No respiratory distress.     Breath sounds: Normal breath sounds. No wheezing.  Abdominal:     General: Bowel sounds are normal. There is no distension.     Palpations: Abdomen is soft.     Tenderness: There is no abdominal tenderness.  Musculoskeletal:        General: Normal range of motion.     Cervical back: Normal range of motion and neck supple.  Skin:     General: Skin is warm and dry.  Neurological:     General: No focal deficit present.     Mental Status: He is alert.  Psychiatric:        Mood and Affect: Mood normal.        Behavior: Behavior normal.     The results of significant diagnostics from this hospitalization (including imaging, microbiology, ancillary and laboratory) are listed below for reference.   Microbiology: Recent Results (from the past 240 hour(s))  Resp Panel by RT-PCR (Flu A&B, Covid) Nasopharyngeal Swab     Status:  None   Collection Time: 10/31/21 10:58 PM   Specimen: Nasopharyngeal Swab; Nasopharyngeal(NP) swabs in vial transport medium  Result Value Ref Range Status   SARS Coronavirus 2 by RT PCR NEGATIVE NEGATIVE Final    Comment: (NOTE) SARS-CoV-2 target nucleic acids are NOT DETECTED.  The SARS-CoV-2 RNA is generally detectable in upper respiratory specimens during the acute phase of infection. The lowest concentration of SARS-CoV-2 viral copies this assay can detect is 138 copies/mL. A negative result does not preclude SARS-Cov-2 infection and should not be used as the sole basis for treatment or other patient management decisions. A negative result may occur with  improper specimen collection/handling, submission of specimen other than nasopharyngeal swab, presence of viral mutation(s) within the areas targeted by this assay, and inadequate number of viral copies(<138 copies/mL). A negative result must be combined with clinical observations, patient history, and epidemiological information. The expected result is Negative.  Fact Sheet for Patients:  EntrepreneurPulse.com.au  Fact Sheet for Healthcare Providers:  IncredibleEmployment.be  This test is no t yet approved or cleared by the [place] FDA and  has been authorized for detection and/or diagnosis of SARS-CoV-2 by FDA under an Emergency Use Authorization (EUA). This EUA will remain  in effect (meaning  this test can be used) for the duration of the COVID-19 declaration under Section 564(b)(1) of the Act, 21 U.S.C.section 360bbb-3(b)(1), unless the authorization is terminated  or revoked sooner.       Influenza A by PCR NEGATIVE NEGATIVE Final   Influenza B by PCR NEGATIVE NEGATIVE Final    Comment: (NOTE) The Xpert Xpress SARS-CoV-2/FLU/RSV plus assay is intended as an aid in the diagnosis of influenza from Nasopharyngeal swab specimens and should not be used as a sole basis for treatment. Nasal washings and aspirates are unacceptable for Xpert Xpress SARS-CoV-2/FLU/RSV testing.  Fact Sheet for Patients: EntrepreneurPulse.com.au  Fact Sheet for Healthcare Providers: IncredibleEmployment.be  This test is not yet approved or cleared by the [place] FDA and has been authorized for detection and/or diagnosis of SARS-CoV-2 by FDA under an Emergency Use Authorization (EUA). This EUA will remain in effect (meaning this test can be used) for the duration of the COVID-19 declaration under Section 564(b)(1) of the Act, 21 U.S.C. section 360bbb-3(b)(1), unless the authorization is terminated or revoked.  Performed at [facility], [place] [address] [place], [place] [address]      Labs: BNP (last 3 results) No results for input(s): BNP in the last 8760 hours. Basic Metabolic Panel: Recent Labs  Lab 10/31/21 1951 11/01/21 0300 11/02/21 1108 11/03/21 0850  NA 126* 133* 129* 129*  K 4.7 3.7 3.4* 3.9  CL 92* 101 100 99  CO2 19* 25 23 17*  GLUCOSE 536* 155* 176* 353*  BUN 25* 20 22 16   CREATININE 0.97 0.67 0.63 0.75  CALCIUM 10.0 9.1 8.7* 8.6*  MG  --   --   --  1.8   Liver Function Tests: Recent Labs  Lab 10/31/21 1951  AST 62*  ALT 74*  ALKPHOS 63  BILITOT 1.6*  PROT 8.6*  ALBUMIN 4.1   No results for input(s): LIPASE, AMYLASE in the last 168 hours. No results for input(s): AMMONIA in the last 168  hours. CBC: Recent Labs  Lab 10/31/21 1951 11/01/21 0514 11/02/21 1108 11/03/21 0850  WBC 4.7 3.1* 5.0 2.7*  NEUTROABS 3.4  --  3.6 2.0  HGB 14.4 12.4* 13.6 13.5  HCT 41.3 35.2* 38.5* 39.7  MCV 90.2 90.7  91.2 93.0  PLT 94* 75* 75* 68*   Cardiac Enzymes: No results for input(s): CKTOTAL, CKMB, CKMBINDEX, TROPONINI in the last 168 hours. BNP: Invalid input(s): POCBNP CBG: Recent Labs  Lab 11/02/21 1758 11/02/21 2108 11/03/21 0756 11/03/21 1158 11/03/21 1713  GLUCAP 113* 174* 333* 121* 74   D-Dimer No results for input(s): DDIMER in the last 72 hours. Hgb A1c Recent Labs    11/01/21 0514  HGBA1C 7.1*   Lipid Profile No results for input(s): CHOL, HDL, LDLCALC, TRIG, CHOLHDL, LDLDIRECT in the last 72 hours. Thyroid function studies No results for input(s): TSH, T4TOTAL, T3FREE, THYROIDAB in the last 72 hours.  Invalid input(s): FREET3 Anemia work up No results for input(s): VITAMINB12, FOLATE, FERRITIN, TIBC, IRON, RETICCTPCT in the last 72 hours. Urinalysis    Component Value Date/Time   COLORURINE STRAW (A) 10/31/2021 2141   APPEARANCEUR CLEAR 10/31/2021 2141   LABSPEC 1.028 10/31/2021 2141   PHURINE 5.0 10/31/2021 2141   GLUCOSEU >=500 (A) 10/31/2021 2141   HGBUR NEGATIVE 10/31/2021 2141   BILIRUBINUR NEGATIVE 10/31/2021 2141   KETONESUR 80 (A) 10/31/2021 2141   PROTEINUR NEGATIVE 10/31/2021 2141   UROBILINOGEN 0.2 05/05/2007 2151   NITRITE NEGATIVE 10/31/2021 2141   LEUKOCYTESUR NEGATIVE 10/31/2021 2141   Sepsis Labs Invalid input(s): PROCALCITONIN,  WBC,  LACTICIDVEN Microbiology Recent Results (from the past 240 hour(s))  Resp Panel by RT-PCR (Flu A&B, Covid) Nasopharyngeal Swab     Status: None   Collection Time: 10/31/21 10:58 PM   Specimen: Nasopharyngeal Swab; Nasopharyngeal(NP) swabs in vial transport medium  Result Value Ref Range Status   SARS Coronavirus 2 by RT PCR NEGATIVE NEGATIVE Final    Comment: (NOTE) SARS-CoV-2 target nucleic  acids are NOT DETECTED.  The SARS-CoV-2 RNA is generally detectable in upper respiratory specimens during the acute phase of infection. The lowest concentration of SARS-CoV-2 viral copies this assay can detect is 138 copies/mL. A negative result does not preclude SARS-Cov-2 infection and should not be used as the sole basis for treatment or other patient management decisions. A negative result may occur with  improper specimen collection/handling, submission of specimen other than nasopharyngeal swab, presence of viral mutation(s) within the areas targeted by this assay, and inadequate number of viral copies(<138 copies/mL). A negative result must be combined with clinical observations, patient history, and epidemiological information. The expected result is Negative.  Fact Sheet for Patients:  EntrepreneurPulse.com.au  Fact Sheet for Healthcare Providers:  IncredibleEmployment.be  This test is no t yet approved or cleared by the [place] FDA and  has been authorized for detection and/or diagnosis of SARS-CoV-2 by FDA under an Emergency Use Authorization (EUA). This EUA will remain  in effect (meaning this test can be used) for the duration of the COVID-19 declaration under Section 564(b)(1) of the Act, 21 U.S.C.section 360bbb-3(b)(1), unless the authorization is terminated  or revoked sooner.       Influenza A by PCR NEGATIVE NEGATIVE Final   Influenza B by PCR NEGATIVE NEGATIVE Final    Comment: (NOTE) The Xpert Xpress SARS-CoV-2/FLU/RSV plus assay is intended as an aid in the diagnosis of influenza from Nasopharyngeal swab specimens and should not be used as a sole basis for treatment. Nasal washings and aspirates are unacceptable for Xpert Xpress SARS-CoV-2/FLU/RSV testing.  Fact Sheet for Patients: EntrepreneurPulse.com.au  Fact Sheet for Healthcare Providers: IncredibleEmployment.be  This  test is not yet approved or cleared by the [place] FDA and has been authorized for detection and/or diagnosis of  SARS-CoV-2 by FDA under an Emergency Use Authorization (EUA). This EUA will remain in effect (meaning this test can be used) for the duration of the COVID-19 declaration under Section 564(b)(1) of the Act, 21 U.S.C. section 360bbb-3(b)(1), unless the authorization is terminated or revoked.  Performed at [facility], [place] [address] [place], [place] [address]     Procedures/Studies: DG Chest 1 View  Result Date: 10/31/2021 CLINICAL DATA:  Increasing weakness EXAM: PORTABLE CHEST 1 VIEW COMPARISON:  03/05/2021 FINDINGS: The heart size and mediastinal contours are within normal limits. Both lungs are clear. The visualized skeletal structures are unremarkable. IMPRESSION: No active disease. Electronically Signed   By: Inez Catalina M.D.   On: 10/31/2021 22:09   CT Head Wo Contrast  Result Date: 10/31/2021 CLINICAL DATA:  Weakness, status post recent fall. EXAM: CT HEAD WITHOUT CONTRAST TECHNIQUE: Contiguous axial images were obtained from the base of the skull through the vertex without intravenous contrast. RADIATION DOSE REDUCTION: This exam was performed according to the departmental dose-optimization program which includes automated exposure control, adjustment of the mA and/or kV according to patient size and/or use of iterative reconstruction technique. COMPARISON:  None. FINDINGS: Brain: There is mild cerebral atrophy with widening of the extra-axial spaces and ventricular dilatation. There are areas of decreased attenuation within the white matter tracts of the supratentorial brain, consistent with microvascular disease changes. Vascular: No hyperdense vessel or unexpected calcification. Skull: Normal. Negative for fracture or focal lesion. Sinuses/Orbits: No acute finding. Other: None. IMPRESSION: 1. Generalized cerebral atrophy and chronic white matter  ischemic changes without evidence of an acute intracranial abnormality. Electronically Signed   By: Virgina Norfolk M.D.   On: 10/31/2021 22:26   CT ABDOMEN PELVIS W CONTRAST  Result Date: 11/02/2021 CLINICAL DATA:  Hepatocellular carcinoma, radiation and chemotherapy. Decreased appetite. EXAM: CT ABDOMEN AND PELVIS WITH CONTRAST TECHNIQUE: Multidetector CT imaging of the abdomen and pelvis was performed using the standard protocol following bolus administration of intravenous contrast. RADIATION DOSE REDUCTION: This exam was performed according to the departmental dose-optimization program which includes automated exposure control, adjustment of the mA and/or kV according to patient size and/or use of iterative reconstruction technique. CONTRAST:  30m OMNIPAQUE IOHEXOL 300 MG/ML  SOLN COMPARISON:  CT abdomen and pelvis 10/14/2021. FINDINGS: Lower chest: No acute abnormality. Hepatobiliary: Hypodense right hepatic mass measures approximately 5.2 x 3.1 by 3.5 cm (previously 5.8 x 3.4 by 4.2 cm). Rounded hypodensity in the dome of the liver is too small to characterize and unchanged, favored as a cyst. No new liver lesions are identified. No gallstones are identified. There is no biliary ductal dilatation. Pancreas: Unremarkable. No pancreatic ductal dilatation or surrounding inflammatory changes. Spleen: Normal in size without focal abnormality. Adrenals/Urinary Tract: There is a nonobstructing left renal calculus measuring 4 mm. Otherwise, kidneys, adrenal glands, and bladder are within normal limits. Stomach/Bowel: There is some wall thickening of the ascending colon and cecum versus normal under distension. There is no surrounding inflammation. There is no bowel obstruction, free air or pneumatosis. The appendix, stomach, and small bowel appear within normal limits. Vascular/Lymphatic: Aortic atherosclerosis. No enlarged abdominal or pelvic lymph nodes. Reproductive: Prostate gland is prominent in size.  Other: No abdominal wall hernia or abnormality. There is a small amount of ascites in the right upper quadrant which is new from prior. Subcutaneous air in the anterior abdominal wall likely related to medication injection sites. Musculoskeletal: Degenerative changes affect the spine. IMPRESSION: 1. Right hepatic mass has minimally decreased  in size. No new focal hepatic masses are seen. 2. New small amount of ascites in the right upper quadrant. 3. Wall thickening of the cecum and ascending colon versus normal under distension. Correlate for nonspecific colitis. 4. Nonobstructing left renal calculus. 5.  Aortic Atherosclerosis (ICD10-I70.0). Electronically Signed   By: Ronney Asters M.D.   On: 11/02/2021 20:46   CT ABDOMEN PELVIS W CONTRAST  Result Date: 10/15/2021 CLINICAL DATA:  Hepatocellular carcinoma EXAM: CT ABDOMEN AND PELVIS WITH CONTRAST TECHNIQUE: Multidetector CT imaging of the abdomen and pelvis was performed using the standard protocol following bolus administration of intravenous contrast. CONTRAST:  60m OMNIPAQUE IOHEXOL 350 MG/ML SOLN COMPARISON:  CT abdomen 06/16/2021 FINDINGS: Lower chest: No acute abnormality. Hepatobiliary: Interval decreased size of the wedge shaped hypodensity in the right hepatic lobe segment 8 region of treated mass which now measures up to 5.8 x 3.4 x 4.2 cm in AP, transverse and craniocaudal dimensions. Grossly stable minimal faint portal venous phase enhancement mostly in the superior aspect. Decreased size of an ill-defined faint hypodense lesion just posteriorly in the right hepatic lobe now measuring 7 mm. No new mass or suspicious enhancement identified in the liver. Gallbladder appears within normal limits. No biliary ductal dilatation identified. Pancreas: Unremarkable. No pancreatic ductal dilatation or surrounding inflammatory changes. Spleen: Upper normal size with no lesions identified. Adrenals/Urinary Tract: Adrenal glands are normal. 6 mm nonobstructing  calculus in the upper left kidney. No enhancing renal mass or hydronephrosis identified bilaterally. Urinary bladder is incompletely distended with circumferential wall thickening. Stomach/Bowel: No bowel obstruction, free air or pneumatosis. No bowel wall edema identified. Appendix is normal. Vascular/Lymphatic: Aortic atherosclerosis. No enlarged abdominal or pelvic lymph nodes. Reproductive: Prostate gland is mildly enlarged. Other: No ascites. Musculoskeletal: Degenerative changes of the lumbar spine. No suspicious bony lesions identified. IMPRESSION: 1. Continued interval decreased size of the dominant right hepatic lobe mass lesion as described as well as decreased size of the smaller hypodense lesion more posteriorly in the right hepatic lobe. No new hepatic mass or suspicious enhancement identified. 2. Other ancillary findings as described. Electronically Signed   By: DOfilia NeasM.D.   On: 10/15/2021 12:24     Time coordinating discharge: Over 30 minutes    DDwyane Dee MD  [facility] 11/03/2021, 7:35 PM

## 2021-11-03 NOTE — Progress Notes (Signed)
Nutrition Follow-up  DOCUMENTATION CODES:   Severe malnutrition in context of chronic illness  INTERVENTION:   -Ensure Enlive po daily each supplement provides 350 kcal and 20 grams of protein    -Ensure MAX Protein po BID, each supplement provides 150 kcal and 30 grams of protein    -Plate Method for diabetes provided in discharge instructions   NUTRITION DIAGNOSIS:   Severe Malnutrition related to chronic illness, cancer and cancer related treatments as evidenced by energy intake < or equal to 75% for > or equal to 1 month, moderate fat depletion, severe muscle depletion.  Ongoing  GOAL:   Patient will meet greater than or equal to 90% of their needs  Progressing.  MONITOR:   PO intake, Supplement acceptance, Labs, Weight trends, I & O's  ASSESSMENT:   76 y.o. 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  Patient in room, RN removing IV during visit. Pt states he is eating better. Had cereal with milk this morning for breakfast. He is drinking the Ensure Max supplements. Pt states he has no questions regarding diet for RD. Now awaiting visit from DM coordinator for insulin teaching.   Admission weight: 126 lbs.  Medications: Multivitamin with minerals daily  Labs reviewed: CBGs: 113-333 Low Na  Diet Order:   Diet Order             Diet - low sodium heart healthy           Diet Carb Modified           Diet heart healthy/carb modified Room service appropriate? Yes; Fluid consistency: Thin  Diet effective now                   EDUCATION NEEDS:    Education needs have been addressed  Skin:  Skin Assessment: Reviewed RN Assessment  Last BM:  1/17  Height:   Ht Readings from Last 1 Encounters:  10/31/21 5\' 5"  (1.651 m)    Weight:   Wt Readings from Last 1 Encounters:  10/31/21 57.2 kg    BMI:  Body mass index is 20.97 kg/m.  Estimated Nutritional Needs:   Kcal:  1850-2050  Protein:  90-100g  Fluid:  2L/day  Clayton Bibles, MS, RD, LDN Inpatient Clinical Dietitian Contact information available via Amion

## 2021-11-03 NOTE — TOC Initial Note (Signed)
Transition of Care [facility]) - Initial/Assessment Note    Patient Details  Name: Shawn Bailey MRN: [id number] Date of Birth: 18-Jan-1946  Transition of Care [facility]) CM/SW Contact:    Lynnell Catalan, RN Phone Number: 11/03/2021, 12:41 PM  Clinical Narrative:                 Pt offered choice for HHRN. [facility] chosen. [facility] liaison given referral for RN for new diabetes diagnosis.  Expected Discharge Plan: [place] Barriers to Discharge: No Barriers Identified   Patient Goals and CMS Choice Patient states their goals for this hospitalization and ongoing recovery are:: To go home CMS Medicare.gov Compare Post Acute Care list provided to:: Patient Choice offered to / list presented to : Patient  Expected Discharge Plan and Services Expected Discharge Plan: [place]   Discharge Planning Services: CM Consult Post Acute Care Choice: [place] arrangements for the past 2 months: Single Family Home Expected Discharge Date: 11/03/21                         HH Arranged: PT, RN [place] Agency: [place] Date [facility] Agency Contacted: 11/03/21 Time HH Agency Contacted: [id number] Representative spoke with at [place]: Tommi Rumps  Prior Living Arrangements/Services Living arrangements for the past 2 months: [place] with:: Self Patient language and need for interpreter reviewed:: Yes Do you feel safe going back to the place where you live?: Yes      Need for Family Participation in Patient Care: Yes (Comment) Care giver support system in place?: Yes (comment)   Criminal Activity/Legal Involvement Pertinent to Current Situation/Hospitalization: No - Comment as needed  Activities of Daily Living Home Assistive Devices/Equipment: Cane (specify quad or straight), Walker (specify type) (needs CBG meter!) ADL Screening (condition at time of admission) Patient's cognitive ability adequate to safely complete daily activities?:  Yes Is the patient deaf or have difficulty hearing?: No Does the patient have difficulty seeing, even when wearing glasses/contacts?: No Does the patient have difficulty concentrating, remembering, or making decisions?: Yes Patient able to express need for assistance with ADLs?: Yes Does the patient have difficulty dressing or bathing?: Yes Independently performs ADLs?: No Communication: Independent Dressing (OT): Needs assistance (very weak, takes patient a long time to perform activities.) Is this a change from baseline?: Change from baseline, expected to last >3 days Grooming: Needs assistance Is this a change from baseline?: Change from baseline, expected to last >3 days Feeding: Independent Bathing: Needs assistance Is this a change from baseline?: Change from baseline, expected to last >3 days Toileting: Needs assistance Is this a change from baseline?: Change from baseline, expected to last >3days In/Out Bed: Needs assistance Is this a change from baseline?: Change from baseline, expected to last >3 days Walks in Home: Needs assistance Is this a change from baseline?: Change from baseline, expected to last >3 days Does the patient have difficulty walking or climbing stairs?: Yes Weakness of Legs: Both Weakness of Arms/Hands: Both (especially left arm)  Permission Sought/Granted Permission sought to share information with : Chartered certified accountant granted to share information with : Yes, Verbal Permission Granted     Permission granted to share info w AGENCY: [facility]        Emotional Assessment Appearance:: Appears stated age Attitude/Demeanor/Rapport: Gracious Affect (typically observed): Calm Orientation: : Oriented to Self, Oriented to Place, Oriented to  Time, Oriented to Situation Alcohol /  Substance Use: Not Applicable Psych Involvement: No (comment)  Admission diagnosis:  Weakness [R53.1] Hyperglycemia [R73.9] Patient Active Problem List    Diagnosis Date Noted   Protein-calorie malnutrition, severe 11/01/2021   Hyperglycemia 11/01/2021   Hyperosmolar hyperglycemic state (HHS) ([place]) 10/31/2021   Generalized weakness 10/31/2021   Blood pressure elevated without history of HTN 10/31/2021   Hepatocellular carcinoma ([place])    Pain    RUQ abdominal pain 03/05/2021   PCP:  [place], [place]:   [id number] [place] [address] Phone: [phone] Fax: [phone]  [place] [address] Phone: [phone] Fax: [phone]     Social Determinants of Health (SDOH) Interventions    Readmission Risk Interventions No flowsheet data found.

## 2021-11-04 ENCOUNTER — Encounter (HOSPITAL_COMMUNITY): Payer: Self-pay | Admitting: Emergency Medicine

## 2021-11-04 ENCOUNTER — Other Ambulatory Visit: Payer: Self-pay

## 2021-11-04 ENCOUNTER — Emergency Department (HOSPITAL_COMMUNITY)
Admission: EM | Admit: 2021-11-04 | Discharge: 2021-11-04 | Disposition: A | Discharge status: Home / Self Care | Payer: Medicare Other | Attending: Emergency Medicine | Admitting: Emergency Medicine

## 2021-11-04 DIAGNOSIS — R739 Hyperglycemia, unspecified: Secondary | ICD-10-CM | POA: Diagnosis present

## 2021-11-04 DIAGNOSIS — Z20822 Contact with and (suspected) exposure to covid-19: Secondary | ICD-10-CM | POA: Diagnosis not present

## 2021-11-04 DIAGNOSIS — E1165 Type 2 diabetes mellitus with hyperglycemia: Secondary | ICD-10-CM | POA: Diagnosis not present

## 2021-11-04 DIAGNOSIS — Z794 Long term (current) use of insulin: Secondary | ICD-10-CM | POA: Diagnosis not present

## 2021-11-04 DIAGNOSIS — Z7984 Long term (current) use of oral hypoglycemic drugs: Secondary | ICD-10-CM | POA: Insufficient documentation

## 2021-11-04 LAB — CBC WITH DIFFERENTIAL/PLATELET
Abs Immature Granulocytes: 0.02 10*3/uL (ref 0.00–0.07)
Basophils Absolute: 0 10*3/uL (ref 0.0–0.1)
Basophils Relative: 0 %
Eosinophils Absolute: 0 10*3/uL (ref 0.0–0.5)
Eosinophils Relative: 0 %
HCT: 41 % (ref 39.0–52.0)
Hemoglobin: 14.3 g/dL (ref 13.0–17.0)
Immature Granulocytes: 0 %
Lymphocytes Relative: 10 %
Lymphs Abs: 0.5 10*3/uL — ABNORMAL LOW (ref 0.7–4.0)
MCH: 31.9 pg (ref 26.0–34.0)
MCHC: 34.9 g/dL (ref 30.0–36.0)
MCV: 91.5 fL (ref 80.0–100.0)
Monocytes Absolute: 0.5 10*3/uL (ref 0.1–1.0)
Monocytes Relative: 10 %
Neutro Abs: 4 10*3/uL (ref 1.7–7.7)
Neutrophils Relative %: 80 %
Platelets: 86 10*3/uL — ABNORMAL LOW (ref 150–400)
RBC: 4.48 MIL/uL (ref 4.22–5.81)
RDW: 12.2 % (ref 11.5–15.5)
WBC: 5 10*3/uL (ref 4.0–10.5)
nRBC: 0 % (ref 0.0–0.2)

## 2021-11-04 LAB — LIPASE, BLOOD: Lipase: 27 U/L (ref 11–51)

## 2021-11-04 LAB — BLOOD GAS, VENOUS
Acid-base deficit: 3.4 mmol/L — ABNORMAL HIGH (ref 0.0–2.0)
Bicarbonate: 20.6 mmol/L (ref 20.0–28.0)
O2 Saturation: 82.3 %
Patient temperature: 37
pCO2, Ven: 35.8 mmHg — ABNORMAL LOW (ref 44.0–60.0)
pH, Ven: 7.379 (ref 7.250–7.430)
pO2, Ven: 49.8 mmHg — ABNORMAL HIGH (ref 32.0–45.0)

## 2021-11-04 LAB — COMPREHENSIVE METABOLIC PANEL
ALT: 59 U/L — ABNORMAL HIGH (ref 0–44)
AST: 65 U/L — ABNORMAL HIGH (ref 15–41)
Albumin: 3.7 g/dL (ref 3.5–5.0)
Alkaline Phosphatase: 52 U/L (ref 38–126)
Anion gap: 11 (ref 5–15)
BUN: 18 mg/dL (ref 8–23)
CO2: 20 mmol/L — ABNORMAL LOW (ref 22–32)
Calcium: 9.4 mg/dL (ref 8.9–10.3)
Chloride: 100 mmol/L (ref 98–111)
Creatinine, Ser: 0.78 mg/dL (ref 0.61–1.24)
GFR, Estimated: >60 mL/min (ref >60)
Glucose, Bld: 258 mg/dL — ABNORMAL HIGH (ref 70–99)
Potassium: 3.7 mmol/L (ref 3.5–5.1)
Sodium: 131 mmol/L — ABNORMAL LOW (ref 135–145)
Total Bilirubin: 1.1 mg/dL (ref 0.3–1.2)
Total Protein: 7.6 g/dL (ref 6.5–8.1)

## 2021-11-04 LAB — CBG MONITORING, ED
Glucose-Capillary: 297 mg/dL — ABNORMAL HIGH (ref 70–99)
Glucose-Capillary: 245 mg/dL — ABNORMAL HIGH (ref 70–99)
Glucose-Capillary: 110 mg/dL — ABNORMAL HIGH (ref 70–99)
Glucose-Capillary: 271 mg/dL — ABNORMAL HIGH (ref 70–99)

## 2021-11-04 LAB — BETA-HYDROXYBUTYRIC ACID: Beta-Hydroxybutyric Acid: 1.59 mmol/L — ABNORMAL HIGH (ref 0.05–0.27)

## 2021-11-04 LAB — RESP PANEL BY RT-PCR (FLU A&B, COVID) ARPGX2
Influenza A by PCR: NEGATIVE
Influenza B by PCR: NEGATIVE
SARS Coronavirus 2 by RT PCR: NEGATIVE

## 2021-11-04 LAB — AMMONIA: Ammonia: 40 umol/L — ABNORMAL HIGH (ref 9–35)

## 2021-11-04 LAB — OSMOLALITY: Osmolality: 298 mOsm/kg — ABNORMAL HIGH (ref 275–295)

## 2021-11-04 MED ORDER — SODIUM CHLORIDE 0.9 % IV BOLUS
1000.0000 mL | Freq: Once | INTRAVENOUS | Status: AC
  Administered 2021-11-04: 1000 mL via INTRAVENOUS

## 2021-11-04 MED ORDER — SITAGLIPTIN PHOSPHATE 100 MG PO TABS: 100.0000 mg | ORAL_TABLET | Freq: Every day | ORAL | 0 refills | Status: DC

## 2021-11-04 NOTE — Progress Notes (Signed)
RNCM communicated with Shawn Bailey at [facility], to advise of readmit to ED. Shawn Bailey advised patient is all set for [facility] services. It normally takes about 24-48 hours for [facility] services to get started. No additional TOC needs at this time.

## 2021-11-04 NOTE — ED Provider Triage Note (Signed)
Emergency Medicine Provider Triage Evaluation Note  Shawn Bailey , a 76 y.o. male  was evaluated in triage.  Pt complains of hyperglycemia.  He had 20 units of insulin, not sure if he got it because he felt some of it dribble out and then his ex wife gave him 20 units of insulin.  This was about 930am.  He also took both his oral diabetes medications.   His ex wife says that he is also confused since coming home from the hospital.    Review of Systems  Positive:  Negative: See above.   Physical Exam  BP (!) 168/92 (BP Location: Left Arm)    Pulse (!) 115    Temp 98.4 F (36.9 C) (Oral)    Resp 18    SpO2 99%  Gen:   Awake, no distress   Resp:  Normal effort  MSK:   Moves extremities without difficulty  Other:  Normal speech.   Medical Decision Making  Medically screening exam initiated at 12:38 PM.  Appropriate orders placed.  Shawn Bailey was informed that the remainder of the evaluation will be completed by another provider, this initial triage assessment does not replace that evaluation, and the importance of remaining in the ED until their evaluation is complete.     Lorin Glass, [place] 11/04/21 1245

## 2021-11-04 NOTE — ED Triage Notes (Signed)
Per caregiver pt was recently discharged from the hospital and dx w/ DM. Pt did not know how to use his CBG monitor at home so his caregiver came over and checked his blood sugar which was in the 400s. Pt attempted to give himself 20 units of Novalog, unsure if he gave it to himself. Caregiver gave himself 20 units Novalog insulin. CBG was 409 and 10 min later CBG was 418.

## 2021-11-04 NOTE — ED Provider Notes (Signed)
[place] DEPT Provider Note   CSN: [id number] Arrival date & time: 11/04/21  1140     History  Chief Complaint  Patient presents with   Hyperglycemia    Shawn Bailey is a 76 y.o. male.  Presented to the emergency room with concern for hyperglycemia.  Patient discharged yesterday after diagnosis with diabetes, discharged home with insulin.  Patient states that overall feels fine, not having any symptoms at present.  This morning felt somewhat confused and thirsty and and was drinking a lot of water.  Patient's caregiver, ex-wife, reports that she went to check on him this morning and he seemed a little bit confused, gave 20 units of insulin, glucose remained elevated and decided to come to ER for further assessment.  Glucose up to 418 at home.  Additional history obtained from discharge summary from Dr. Sabino Gasser, patient with history of hepatocellular carcinoma, admitted for HHS.  Diabetic coordinator was consulted.    HPI     Home Medications Prior to Admission medications   Medication Sig Start Date End Date Taking? Authorizing Provider  sitaGLIPtin (JANUVIA) 100 MG tablet Take 1 tablet (100 mg total) by mouth daily. 11/04/21  Yes Lucrezia Starch, MD  Ascorbic Acid (VITAMIN C) 100 MG tablet Take 100 mg by mouth daily.    [provider]  blood glucose meter kit and supplies Dispense based on patient and insurance preference. Use up to four times daily as directed. (FOR ICD-10 E10.9, E11.9). 11/03/21   Dwyane Dee, MD  glipiZIDE (GLUCOTROL) 5 MG tablet Take 1 tablet (5 mg total) by mouth daily before breakfast. 11/04/21   Dwyane Dee, MD  HYDROcodone-acetaminophen (NORCO) 5-325 MG tablet Take 1 tablet by mouth every 8 (eight) hours as needed for moderate pain. 09/21/21   [name], MD  Insulin Pen Needle (PEN NEEDLES 3/16") 31G X 5 MM MISC 1 each by Does not apply route with breakfast, with lunch, and with evening meal. 11/03/21    Dwyane Dee, MD  metFORMIN (GLUCOPHAGE) 500 MG tablet Take 1 tablet (500 mg total) by mouth 2 (two) times daily with a meal. 11/05/21   Dwyane Dee, MD  Multiple Vitamin (MULTIVITAMIN WITH MINERALS) TABS tablet Take 1 tablet by mouth daily.    [provider]  ondansetron (ZOFRAN) 8 MG tablet Take 1 tablet (8 mg total) by mouth every 8 (eight) hours as needed for nausea or vomiting. 04/28/21   Dede Query T, PA-C  polyethylene glycol (MIRALAX / GLYCOLAX) 17 g packet Take 17 g by mouth daily as needed for mild constipation. 03/08/21   Georgette Shell, MD  prochlorperazine (COMPAZINE) 10 MG tablet Take 1 tablet (10 mg total) by mouth every 6 (six) hours as needed for nausea or vomiting. 04/28/21   Lincoln Brigham, PA-C  senna (SENOKOT) 8.6 MG TABS tablet Take 1 tablet (8.6 mg total) by mouth 2 (two) times daily. Patient taking differently: Take 1 tablet by mouth 2 (two) times daily as needed for moderate constipation. 03/08/21   Georgette Shell, MD  vitamin B-12 (CYANOCOBALAMIN) 100 MCG tablet Take 100 mcg by mouth daily.    [provider]      Allergies    Patient has no known allergies.    Review of Systems   Review of Systems  Constitutional:  Positive for fatigue. Negative for chills and fever.  HENT:  Negative for ear pain and sore throat.   Eyes:  Negative for pain and visual disturbance.  Respiratory:  Negative for cough and shortness of breath.   Cardiovascular:  Negative for chest pain and palpitations.  Gastrointestinal:  Negative for abdominal pain and vomiting.  Endocrine: Positive for polydipsia and polyuria.  Genitourinary:  Negative for dysuria and hematuria.  Musculoskeletal:  Negative for arthralgias and back pain.  Skin:  Negative for color change and rash.  Neurological:  Negative for seizures and syncope.  All other systems reviewed and are negative.  Physical Exam Updated Vital Signs BP (!) 151/85    Pulse 91    Temp 98.4 F (36.9 C)  (Oral)    Resp 19    SpO2 99%  Physical Exam Vitals and nursing note reviewed.  Constitutional:      General: He is not in acute distress.    Appearance: He is well-developed.  HENT:     Head: Normocephalic and atraumatic.  Eyes:     Conjunctiva/sclera: Conjunctivae normal.  Cardiovascular:     Rate and Rhythm: Normal rate and regular rhythm.     Heart sounds: No murmur heard. Pulmonary:     Effort: Pulmonary effort is normal. No respiratory distress.     Breath sounds: Normal breath sounds.  Abdominal:     Palpations: Abdomen is soft.     Tenderness: There is no abdominal tenderness.  Musculoskeletal:        General: No swelling.     Cervical back: Neck supple.  Skin:    General: Skin is warm and dry.     Capillary Refill: Capillary refill takes less than 2 seconds.  Neurological:     Mental Status: He is alert.  Psychiatric:        Mood and Affect: Mood normal.    ED Results / Procedures / Treatments   Labs (all labs ordered are listed, but only abnormal results are displayed) Labs Reviewed  COMPREHENSIVE METABOLIC PANEL - Abnormal; Notable for the following components:      Result Value   Sodium 131 (*)    CO2 20 (*)    Glucose, Bld 258 (*)    AST 65 (*)    ALT 59 (*)    All other components within normal limits  CBC WITH DIFFERENTIAL/PLATELET - Abnormal; Notable for the following components:   Platelets 86 (*)    Lymphs Abs 0.5 (*)    All other components within normal limits  AMMONIA - Abnormal; Notable for the following components:   Ammonia 40 (*)    All other components within normal limits  BLOOD GAS, VENOUS - Abnormal; Notable for the following components:   pCO2, Ven 35.8 (*)    pO2, Ven 49.8 (*)    Acid-base deficit 3.4 (*)    All other components within normal limits  OSMOLALITY - Abnormal; Notable for the following components:   Osmolality 298 (*)    All other components within normal limits  BETA-HYDROXYBUTYRIC ACID - Abnormal; Notable for the  following components:   Beta-Hydroxybutyric Acid 1.59 (*)    All other components within normal limits  CBG MONITORING, ED - Abnormal; Notable for the following components:   Glucose-Capillary 297 (*)    All other components within normal limits  CBG MONITORING, ED - Abnormal; Notable for the following components:   Glucose-Capillary 245 (*)    All other components within normal limits  CBG MONITORING, ED - Abnormal; Notable for the following components:   Glucose-Capillary 110 (*)    All other components within normal limits  CBG MONITORING, ED - Abnormal;  Notable for the following components:   Glucose-Capillary 271 (*)    All other components within normal limits  RESP PANEL BY RT-PCR (FLU A&B, COVID) ARPGX2  LIPASE, BLOOD    EKG None  Radiology No results found.  Procedures Procedures    Medications Ordered in ED Medications  sodium chloride 0.9 % bolus 1,000 mL (0 mLs Intravenous Stopped 11/04/21 1826)    ED Course/ Medical Decision Making/ A&P                           Medical Decision Making Risk Prescription drug management. Decision regarding hospitalization.   76 year old gentleman presents to ER with concern for elevated blood sugars.  Patient recently admitted for new onset diabetes, discharged home with glipizide and NovoLog to be taken 3 times daily with meals.  Here patient appears well in no acute distress, initially mildly tachycardic but vitals otherwise normalized without intervention.  Initial blood sugar mildly elevated but repeat without intervention Went down.  Suspect from insulin that was given from caretaker  Additional history obtained from discharge summary from Dr. Sabino Gasser, patient with history of hepatocellular carcinoma, admitted for HHS.  Diabetic coordinator was consulted at the time.  Repeat laboratory work today shows beta hydroxybutyrate at improved from last check during admission.  Bicarbonate today is borderline low but actually  improved from last check at discharge. no anion gap.  I discussed the case with Dr. Dorthula Rue use who recommended discussing with the admitting provider.  Lengthy discussion with Dr. Lonny Prude with the hospitalist service.  Based on his current laboratory work-up, he does not feel patient requires inpatient readmission.  Discussed patient's poor health literacy and difficulty managing the newly prescribed insulin.  He recommends stopping the insulin and starting Januvia.  Lengthy discussion with patient's caregiver, his ex-wife at bedside.  She states that she has confirmed with the home health team that a diabetic educator is going to be able to come out to the house tomorrow morning to recheck patient.  Patient was given some fluids in case of mild dehydration.  Patient remained asymptomatic while observed in ER.  I agree with the hospitalist that he does not require inpatient admission tonight.  Lengthy discussion with patient and caregiver regarding return precautions as well as medication changes.  Stressed need for close outpatient follow-up.        Final Clinical Impression(s) / ED Diagnoses Final diagnoses:  Hyperglycemia    Rx / DC Orders ED Discharge Orders          Ordered    sitaGLIPtin (JANUVIA) 100 MG tablet  Daily        11/04/21 1821              Lucrezia Starch, MD 11/04/21 2050

## 2021-11-04 NOTE — Progress Notes (Signed)
Patient presented to ED for hyperglycemia. [place] services were set up during previous hospital admit/ discharge on yesterday. It ususally takes [facility] services 24-48 hours to get set up.ED RNCM reached out on [facility] to confirm, awaiting response.  TOC will continue to follow

## 2021-11-04 NOTE — Plan of Care (Signed)
Received page regarding management of uncontrolled diabetes with hyperglycemia. Patient was recently discharged from the hospital with a diagnosis of HHS and sent home with insulin. Hemoglobin A1C of 7.1%, patient is >76 yo, poor health literacy. Patient presented to the ED secondary to high blood sugar reading (unsure if pre- or post-prandial) and inability to understand how to give himself insulin. Discussed situation with EDP and with low A1C level, poor literacy, advanced age, recommendation was to continue NS IV fluid bolus, discontinue insulin and start Januvia 100 mg daily with close PCP follow-up. CO2 and beta-hydroxybutyrate is improved from day of discharge. Patient will also need continued education on appropriate diet and adequate hydration.  Cordelia Poche, MD [facility] 11/04/2021, 5:55 PM

## 2021-11-04 NOTE — Discharge Instructions (Signed)
Future Appointments  Date Time Provider [place]  11/08/2021 10:30 AM [facility] LAB [facility] None  11/08/2021 11:00 AM [name], MD [facility] None  11/08/2021 11:45 AM [facility] INFUSION [facility] None  11/11/2021  2:30 PM [place], Amy S, PA-C [facility]   Please follow-up with your primary care doctor as well as your oncologist.  The medicine doctor I spoke with today recommended stopping the insulin and starting the Januvia.  Recommend still checking glucoses 3 times daily and at night.  If you develop vomiting, weakness, confusion, or other new concerns, come back to ER for reassessment.

## 2021-11-08 ENCOUNTER — Other Ambulatory Visit: Payer: Medicare Other

## 2021-11-08 ENCOUNTER — Ambulatory Visit: Payer: Medicare Other | Admitting: Hematology

## 2021-11-08 ENCOUNTER — Ambulatory Visit: Payer: Medicare Other

## 2021-11-08 DIAGNOSIS — C220 Liver cell carcinoma: Secondary | ICD-10-CM

## 2021-11-11 ENCOUNTER — Ambulatory Visit: Payer: Medicare Other | Admitting: Physician Assistant

## 2021-11-11 ENCOUNTER — Telehealth: Payer: Self-pay | Admitting: Hematology

## 2021-11-11 NOTE — Telephone Encounter (Signed)
Sch per 125 pt req, pt friend [name] aware

## 2021-11-19 ENCOUNTER — Other Ambulatory Visit: Payer: Self-pay | Admitting: *Deleted

## 2021-11-19 MED ORDER — HYDROCODONE-ACETAMINOPHEN 5-325 MG PO TABS: 1.0000 | ORAL_TABLET | Freq: Three times a day (TID) | ORAL | 0 refills | Status: DC | PRN

## 2021-11-24 ENCOUNTER — Encounter: Payer: Self-pay | Admitting: Hematology

## 2021-11-24 ENCOUNTER — Inpatient Hospital Stay (HOSPITAL_BASED_OUTPATIENT_CLINIC_OR_DEPARTMENT_OTHER): Payer: Medicare Other | Admitting: Hematology

## 2021-11-24 ENCOUNTER — Inpatient Hospital Stay: Payer: Medicare Other | Attending: Hematology

## 2021-11-24 ENCOUNTER — Other Ambulatory Visit: Payer: Self-pay

## 2021-11-24 VITALS — BP 145/85 | HR 82 | Temp 98.3°F | Resp 18 | Ht 65.0 in | Wt 121.0 lb

## 2021-11-24 DIAGNOSIS — N200 Calculus of kidney: Secondary | ICD-10-CM | POA: Diagnosis not present

## 2021-11-24 DIAGNOSIS — K5730 Diverticulosis of large intestine without perforation or abscess without bleeding: Secondary | ICD-10-CM | POA: Insufficient documentation

## 2021-11-24 DIAGNOSIS — Z79899 Other long term (current) drug therapy: Secondary | ICD-10-CM | POA: Diagnosis not present

## 2021-11-24 DIAGNOSIS — Z794 Long term (current) use of insulin: Secondary | ICD-10-CM | POA: Insufficient documentation

## 2021-11-24 DIAGNOSIS — I2510 Atherosclerotic heart disease of native coronary artery without angina pectoris: Secondary | ICD-10-CM | POA: Insufficient documentation

## 2021-11-24 DIAGNOSIS — E1165 Type 2 diabetes mellitus with hyperglycemia: Secondary | ICD-10-CM | POA: Insufficient documentation

## 2021-11-24 DIAGNOSIS — C220 Liver cell carcinoma: Secondary | ICD-10-CM | POA: Insufficient documentation

## 2021-11-24 DIAGNOSIS — Z9221 Personal history of antineoplastic chemotherapy: Secondary | ICD-10-CM | POA: Diagnosis not present

## 2021-11-24 DIAGNOSIS — I700 Atherosclerosis of aorta: Secondary | ICD-10-CM | POA: Diagnosis not present

## 2021-11-24 DIAGNOSIS — B1920 Unspecified viral hepatitis C without hepatic coma: Secondary | ICD-10-CM | POA: Diagnosis not present

## 2021-11-24 LAB — CBC WITH DIFFERENTIAL (CANCER CENTER ONLY)
Abs Immature Granulocytes: 0.01 10*3/uL (ref 0.00–0.07)
Basophils Absolute: 0 10*3/uL (ref 0.0–0.1)
Basophils Relative: 1 %
Eosinophils Absolute: 0.1 10*3/uL (ref 0.0–0.5)
Eosinophils Relative: 2 %
HCT: 37.7 % — ABNORMAL LOW (ref 39.0–52.0)
Hemoglobin: 13.1 g/dL (ref 13.0–17.0)
Immature Granulocytes: 0 %
Lymphocytes Relative: 21 %
Lymphs Abs: 0.6 10*3/uL — ABNORMAL LOW (ref 0.7–4.0)
MCH: 31.7 pg (ref 26.0–34.0)
MCHC: 34.7 g/dL (ref 30.0–36.0)
MCV: 91.3 fL (ref 80.0–100.0)
Monocytes Absolute: 0.4 10*3/uL (ref 0.1–1.0)
Monocytes Relative: 14 %
Neutro Abs: 1.9 10*3/uL (ref 1.7–7.7)
Neutrophils Relative %: 62 %
Platelet Count: 74 10*3/uL — ABNORMAL LOW (ref 150–400)
RBC: 4.13 MIL/uL — ABNORMAL LOW (ref 4.22–5.81)
RDW: 13 % (ref 11.5–15.5)
WBC Count: 3 10*3/uL — ABNORMAL LOW (ref 4.0–10.5)
nRBC: 0 % (ref 0.0–0.2)

## 2021-11-24 LAB — CMP (CANCER CENTER ONLY)
ALT: 62 U/L — ABNORMAL HIGH (ref 0–44)
AST: 82 U/L — ABNORMAL HIGH (ref 15–41)
Albumin: 3.5 g/dL (ref 3.5–5.0)
Alkaline Phosphatase: 55 U/L (ref 38–126)
Anion gap: 7 (ref 5–15)
BUN: 14 mg/dL (ref 8–23)
CO2: 24 mmol/L (ref 22–32)
Calcium: 9.4 mg/dL (ref 8.9–10.3)
Chloride: 100 mmol/L (ref 98–111)
Creatinine: 0.72 mg/dL (ref 0.61–1.24)
GFR, Estimated: >60 mL/min
Glucose, Bld: 353 mg/dL — ABNORMAL HIGH (ref 70–99)
Potassium: 4.1 mmol/L (ref 3.5–5.1)
Sodium: 131 mmol/L — ABNORMAL LOW (ref 135–145)
Total Bilirubin: 1.1 mg/dL (ref 0.3–1.2)
Total Protein: 7.3 g/dL (ref 6.5–8.1)

## 2021-11-24 LAB — TOTAL PROTEIN, URINE DIPSTICK: Protein, ur: NEGATIVE mg/dL

## 2021-11-24 NOTE — Progress Notes (Signed)
Shawn Bailey   [phone] [phone]   Clinic Follow up Note   Patient Care Team: Pa, [place] as PCP - General (Family Medicine) [name], MD as Consulting Physician (Hematology and Oncology) [name], RN (Inactive) as Oncology Nurse Navigator  Date of Service:  11/24/2021  CHIEF COMPLAINT: f/u of multifocal [place]  CURRENT THERAPY:  -Atezolizumab and bevacizumab q 3 weeks, started 04/28/22, held after 10/13/2021 due to new onset DM  -Y90 on 07/01/21 and 07/22/21  ASSESSMENT & PLAN:  Shawn Bailey is a 76 y.o. male with   1.  Multifocal hepatocellular carcinoma, cT3N0M, stage IIIA -Liver MRI 03/06/21 showed a large 9.5 cm and a 1.3 cm liver mass is seen in right lobe, biopsy 03/08/21 confirmed moderately differentiated hepatocellular carcinoma.  No nodal or distant metastasis on CT 03/05/21. -This is likely related to his HCV infection -He is not a candidate for liver resection or transplant. Dr. [name] at [facility] did not recommend surgery given the location and extent of disease. -He started atezolizumab and bevacizumab on 04/28/21, while waiting for liver radioembolization. He tolerates this well with mild fatigue. He overall feels better after started treatment  -His AFP has dropped significantly which likely indicating good response  -he underwent Y90 on 07/22/21 and on 08/31/21. -restaging CT AP during hospitalization on 11/02/21 showed: minimal decrease in right hepatic mass; no new hepatic mass; new small amount ascites in RUQ. -in light of new hyperglycemia, he expressed fear in continuing chemo. I agree that his DM is probable related to Tecentriq, and agree to stop Tecentriq and bevacizumab.  - I discussed switching to TKI's, such as sorafenib or lenvatinib, which will not cause high BG. We will give him a break and f/u in 4 weeks    2. Symptom management: Weight loss, decreased appetite, fatigue, pain -Secondary to  liver cancer -he has diminished appetite and taste changes, causing weight loss. He is supplementing with 2-3 Ensure a day. He will look into other options given his recent hyperglycemia. -He will continue follow with our nutritionist as needed -he reports using Norco up to two times a day, usually only once at night.  3. Hyperglycemia -likely secondary to immunotherapy -he was placed on metformin and insulin   3. Untreated HCV  -During 02/2021 Hospitalization, labs showed HIV non reactive and hepatitis panel hep C ab positive, he was not aware that he has HCV infection -due to his recent [facility] diagnosis, will hold off HCV treatment for now -he does not see any doctors regularly. I previously referred him to the liver clinic for evaluation of his cancer, HCV, and for endoscopy when needed. This has not yet been scheduled.     PLAN: -Due to his newly onset severe diabetes, will stop Tecentriq and bevacizumab  -lab and f/u in 1 month  -will discuss switching to sorafenib for lenvatinib    No problem-specific Assessment & Plan notes found for this encounter.   SUMMARY OF ONCOLOGIC HISTORY: Oncology History Overview Note  Cancer Staging Hepatocellular carcinoma [facility]) Staging form: Liver, AJCC 8th Edition - Clinical stage from 03/08/2021: Stage IIIA (cT3, cN0, cM0) - Signed by [name], MD on 03/11/2021 Stage prefix: Initial diagnosis Histologic grade (G): G2 Histologic grading system: 4 grade system     Hepatocellular carcinoma ([place])   Initial Diagnosis   Liver mass, right lobe   03/05/2021 Imaging   CT CAP  IMPRESSION: 1. Right hepatic lobe masses which are suspicious for either  metastatic disease or primary malignancy such as hepatocellular carcinoma. These are suboptimally evaluated on this noncontrast exam. Consider multidisciplinary oncology consultation with potential clinical strategies of tissue sampling versus PET to direct sampling. 2. No primary malignancy  identified within the chest, abdomen, or pelvis, given limitation of noncontrast exam. 3. Left nephrolithiasis. 4. Coronary artery atherosclerosis. Aortic Atherosclerosis (ICD10-I70.0). 5. Esophageal air fluid level suggests dysmotility or gastroesophageal reflux.   03/06/2021 Imaging   MRI Liver  IMPRESSION: 9.5 cm heterogeneous hypovascular mass in anterior right hepatic lobe, and 1.3 cm hypervascular mass in the posterior right hepatic lobe. Differential diagnosis includes liver metastases and multifocal hepatocellular carcinoma. Suggest correlation with serum AFP level, and possible tissue sampling.   No evidence of abdominal metastatic disease.   03/08/2021 Initial Biopsy   A. LIVER, RIGHT, BIOPSY:  - Hepatocellular carcinoma.   COMMENT:  The hepatocellular carcinoma is moderately differentiated and associated with areas of necrosis.  Immunohistochemistry shows positivity with glypican-3 and weak staining with HepPar 1.  The tumor is negative with arginase 1, alpha-fetoprotein, CDX2, cytokeratin 7, cytokeratin 20, cytokeratin AE1/AE3, PSA, prostein and MOC31.  Polyclonal CEA shows focal canalicular pattern.  The morphology and immunophenotype are consistent with hepatocellular carcinoma.    03/08/2021 Cancer Staging   Staging form: Liver, AJCC 8th Edition - Clinical stage from 03/08/2021: Stage IIIA (cT3, cN0, cM0) - Signed by [name], MD on 03/11/2021 Stage prefix: Initial diagnosis Histologic grade (G): G2 Histologic grading system: 4 grade system    04/28/2021 -  Chemotherapy   Patient is on Treatment Plan : LUNG Atezolizumab + Bevacizumab q21d Maintenance     06/16/2021 Imaging   CT Abdomen  IMPRESSION: Marked interval decrease in size of the large RIGHT hepatic lobe lesion, biopsy-proven hepatocellular carcinoma. No arterial phase enhancement on early phase, vague enhancement on later phase greater than 20 Hounsfield unit enhancement on venous phase. Less pronounced  arterial enhancement than on previous imaging with small peripheral foci of enhancement on arterial phase particularly along the margin of the tumor near the dome of the liver (image 12/2) 11 mm focus of enhancement in this area.   Interval response to therapy of a small lesion previously compatible with LI-RADS category 5 lesion in the posterior RIGHT hepatic lobe.   No new hepatic lesion.   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   Signs of nephrolithiasis calculus on the LEFT measuring approximately 7 mm.   Colonic diverticulosis without evidence of acute diverticulitis.   Aortic Atherosclerosis (ICD10-I70.0).   11/02/2021 Imaging   EXAM: CT ABDOMEN AND PELVIS WITH CONTRAST   IMPRESSION: 1. Right hepatic mass has minimally decreased in size. No new focal hepatic masses are seen. 2. New small amount of ascites in the right upper quadrant. 3. Wall thickening of the cecum and ascending colon versus normal under distension. Correlate for nonspecific colitis. 4. Nonobstructing left renal calculus. 5.  Aortic Atherosclerosis (ICD10-I70.0).      INTERVAL HISTORY:  Shawn Bailey is here for a follow up of [place]. He was last seen by me on 11/01/21 while he was in the hospital. He presents to the clinic accompanied by his ex-wife and her husband. He reports he is eating better since most recent discharge.  He would like to discontinue treatment due to fear of worsening hyperglycemia.   All other systems were reviewed with the patient and are negative.  MEDICAL HISTORY:  Past Medical History:  Diagnosis Date   Depression    Hepatocellular carcinoma ([place])  03/08/2021   Parasite infestation     SURGICAL HISTORY: Past Surgical History:  Procedure Laterality Date   IR ANGIOGRAM SELECTIVE EACH ADDITIONAL VESSEL  07/01/2021   IR ANGIOGRAM SELECTIVE EACH ADDITIONAL VESSEL  07/22/2021   IR ANGIOGRAM SELECTIVE EACH ADDITIONAL VESSEL  07/22/2021   IR ANGIOGRAM  SELECTIVE EACH ADDITIONAL VESSEL  07/22/2021   IR ANGIOGRAM SELECTIVE EACH ADDITIONAL VESSEL  07/22/2021   IR ANGIOGRAM VISCERAL SELECTIVE  07/01/2021   IR ANGIOGRAM VISCERAL SELECTIVE  07/22/2021   IR EMBO ARTERIAL NOT HEMORR HEMANG INC GUIDE ROADMAPPING  07/01/2021   IR EMBO TUMOR ORGAN ISCHEMIA INFARCT INC GUIDE ROADMAPPING  07/22/2021   IR RADIOLOGIST EVAL & MGMT  06/01/2021   IR US GUIDE VASC ACCESS RIGHT  07/01/2021   IR US GUIDE VASC ACCESS RIGHT  07/22/2021   NO PAST SURGERIES      I have reviewed the social history and family history with the patient and they are unchanged from previous note.  ALLERGIES:  has No Known Allergies.  MEDICATIONS:  Current Outpatient Medications  Medication Sig Dispense Refill   Ascorbic Acid (VITAMIN C) 100 MG tablet Take 100 mg by mouth daily.     blood glucose meter kit and supplies Dispense based on patient and insurance preference. Use up to four times daily as directed. (FOR ICD-10 E10.9, E11.9). 1 each 0   HYDROcodone-acetaminophen (NORCO) 5-325 MG tablet Take 1 tablet by mouth every 8 (eight) hours as needed for moderate pain. 60 tablet 0   Insulin Pen Needle (PEN NEEDLES 3/16") 31G X 5 MM MISC 1 each by Does not apply route with breakfast, with lunch, and with evening meal. 100 each 3   metFORMIN (GLUCOPHAGE) 500 MG tablet Take 1 tablet (500 mg total) by mouth 2 (two) times daily with a meal. 60 tablet 3   Multiple Vitamin (MULTIVITAMIN WITH MINERALS) TABS tablet Take 1 tablet by mouth daily.     ondansetron (ZOFRAN) 8 MG tablet Take 1 tablet (8 mg total) by mouth every 8 (eight) hours as needed for nausea or vomiting. 30 tablet 0   polyethylene glycol (MIRALAX / GLYCOLAX) 17 g packet Take 17 g by mouth daily as needed for mild constipation. 14 each 0   prochlorperazine (COMPAZINE) 10 MG tablet Take 1 tablet (10 mg total) by mouth every 6 (six) hours as needed for nausea or vomiting. 30 tablet 0   senna (SENOKOT) 8.6 MG TABS tablet Take 1 tablet (8.6  mg total) by mouth 2 (two) times daily. (Patient taking differently: Take 1 tablet by mouth 2 (two) times daily as needed for moderate constipation.) 120 tablet 0   vitamin B-12 (CYANOCOBALAMIN) 100 MCG tablet Take 100 mcg by mouth daily.     No current facility-administered medications for this visit.   Facility-Administered Medications Ordered in Other Visits  Medication Dose Route Frequency Provider Last Rate Last Admin   0.9 %  sodium chloride infusion   Intravenous Once [name], MD       0.9 %  sodium chloride infusion   Intravenous Continuous [name], MD        PHYSICAL EXAMINATION: ECOG PERFORMANCE STATUS: 2 - Symptomatic, <50% confined to bed  Vitals:   11/24/21 1056  BP: (!) 145/85  Pulse: 82  Resp: 18  Temp: 98.3 F (36.8 C)  SpO2: 100%   Wt Readings from Last 3 Encounters:  11/24/21 121 lb (54.9 kg)  10/31/21 126 lb (57.2 kg)  10/13/21 126 lb 12.8 oz (57.5  kg)     GENERAL:alert, no distress and comfortable SKIN: skin color normal, no rashes or significant lesions EYES: normal, Conjunctiva are pink and non-injected, sclera clear  NEURO: alert & oriented x 3 with fluent speech  LABORATORY DATA:  I have reviewed the data as listed CBC Latest Ref Rng & Units 11/24/2021 11/04/2021 11/03/2021  WBC 4.0 - 10.5 K/uL 3.0(L) 5.0 2.7(L)  Hemoglobin 13.0 - 17.0 g/dL 13.1 14.3 13.5  Hematocrit 39.0 - 52.0 % 37.7(L) 41.0 39.7  Platelets 150 - 400 K/uL 74(L) 86(L) 68(L)     CMP Latest Ref Rng & Units 11/24/2021 11/04/2021 11/03/2021  Glucose 70 - 99 mg/dL 353(H) 258(H) 353(H)  BUN 8 - 23 mg/dL _0 Creatinine 0.61 - 1.24 mg/dL 0.72 0.78 0.75  Sodium 135 - 145 mmol/L 131(L) 131(L) 129(L)  Potassium 3.5 - 5.1 mmol/L 4.1 3.7 3.9  Chloride 98 - 111 mmol/L 100 100 99  CO2 22 - 32 mmol/L 24 20(L) 17(L)  Calcium 8.9 - 10.3 mg/dL 9.4 9.4 8.6(L)  Total Protein 6.5 - 8.1 g/dL 7.3 7.6 -  Total Bilirubin 0.3 - 1.2 mg/dL 1.1 1.1 -  Alkaline Phos 38 - 126 U/L 55 52 -  AST 15 -  41 U/L 82(H) 65(H) -  ALT 0 - 44 U/L 62(H) 59(H) -      RADIOGRAPHIC STUDIES: I have personally reviewed the radiological images as listed and agreed with the findings in the report. No results found.    No orders of the defined types were placed in this encounter.  All questions were answered. The patient knows to call the clinic with any problems, questions or concerns. No barriers to learning was detected. The total time spent in the appointment was 30 minutes.     [name], MD 11/24/2021   I, Wilburn Mylar, am acting as scribe for [name], MD.   I have reviewed the above documentation for accuracy and completeness, and I agree with the above.

## 2021-11-25 LAB — AFP TUMOR MARKER: AFP, Serum, Tumor Marker: 8.9 ng/mL — ABNORMAL HIGH (ref 0.0–8.4)

## 2021-11-26 ENCOUNTER — Encounter: Payer: Self-pay | Admitting: Hematology

## 2021-12-02 ENCOUNTER — Other Ambulatory Visit: Payer: Self-pay

## 2021-12-02 ENCOUNTER — Ambulatory Visit: Payer: Medicare Other | Admitting: Physician Assistant

## 2021-12-03 ENCOUNTER — Telehealth: Payer: Self-pay

## 2021-12-03 NOTE — Telephone Encounter (Signed)
Spoke with Carlyon Shadow, RN for Dr. Campbell Lerner (PCP) at [facility] [phone]) regarding telephone call Dr. Ernestina Penna office received on 12/02/2021 from [place] with [facility].  Alvis Lemmings is requesting if Dr. Burr Medico could refer the patient to Dr. Posey Pronto an Endocrinologist with [facility].  [facility] RN stated that the pt's BG fluctuates frequently and has been hospitalized several times d/t elevated BG's.  [facility] RN stated that the pt's PCP refused to refer the pt to an Endocrinologist; thus, why she contacted Dr. Ernestina Penna office to see if Dr. Burr Medico would do the referral.  Dr. Burr Medico asked this RN to reach out to pt's PCP to speak with them before sending a referral.  Carlyon Shadow, RN for pt's PCP stated Alvis Lemmings did contact them on 12/02/2021 and Dr. Campbell Lerner did speak with the nurse but does not know what was discussed.   @[phone]  - Darlene, RN for Dr. Campbell Lerner (PCP) called back stating that Dr. Campbell Lerner will place the referral to Dr. Posey Pronto with [facility] to manage pt's BG.

## 2021-12-16 ENCOUNTER — Encounter: Payer: Self-pay | Admitting: Hematology

## 2021-12-23 ENCOUNTER — Inpatient Hospital Stay: Payer: 59 | Attending: Hematology

## 2021-12-23 ENCOUNTER — Inpatient Hospital Stay (HOSPITAL_BASED_OUTPATIENT_CLINIC_OR_DEPARTMENT_OTHER): Payer: 59 | Admitting: Hematology

## 2021-12-23 ENCOUNTER — Encounter: Payer: Self-pay | Admitting: Hematology

## 2021-12-23 ENCOUNTER — Telehealth: Payer: Self-pay

## 2021-12-23 ENCOUNTER — Other Ambulatory Visit: Payer: Self-pay

## 2021-12-23 ENCOUNTER — Other Ambulatory Visit (HOSPITAL_COMMUNITY): Payer: Self-pay

## 2021-12-23 VITALS — BP 149/88 | HR 78 | Temp 98.0°F | Resp 18 | Ht 65.0 in | Wt 124.3 lb

## 2021-12-23 DIAGNOSIS — N200 Calculus of kidney: Secondary | ICD-10-CM | POA: Insufficient documentation

## 2021-12-23 DIAGNOSIS — Z794 Long term (current) use of insulin: Secondary | ICD-10-CM | POA: Diagnosis not present

## 2021-12-23 DIAGNOSIS — I700 Atherosclerosis of aorta: Secondary | ICD-10-CM | POA: Diagnosis not present

## 2021-12-23 DIAGNOSIS — Z79899 Other long term (current) drug therapy: Secondary | ICD-10-CM | POA: Diagnosis not present

## 2021-12-23 DIAGNOSIS — Z9221 Personal history of antineoplastic chemotherapy: Secondary | ICD-10-CM | POA: Diagnosis not present

## 2021-12-23 DIAGNOSIS — I2510 Atherosclerotic heart disease of native coronary artery without angina pectoris: Secondary | ICD-10-CM | POA: Insufficient documentation

## 2021-12-23 DIAGNOSIS — C220 Liver cell carcinoma: Secondary | ICD-10-CM | POA: Diagnosis present

## 2021-12-23 DIAGNOSIS — E1165 Type 2 diabetes mellitus with hyperglycemia: Secondary | ICD-10-CM | POA: Insufficient documentation

## 2021-12-23 LAB — CMP (CANCER CENTER ONLY)
ALT: 44 U/L (ref 0–44)
AST: 52 U/L — ABNORMAL HIGH (ref 15–41)
Albumin: 3.6 g/dL (ref 3.5–5.0)
Alkaline Phosphatase: 69 U/L (ref 38–126)
Anion gap: 6 (ref 5–15)
BUN: 17 mg/dL (ref 8–23)
CO2: 27 mmol/L (ref 22–32)
Calcium: 9.7 mg/dL (ref 8.9–10.3)
Chloride: 98 mmol/L (ref 98–111)
Creatinine: 0.68 mg/dL (ref 0.61–1.24)
GFR, Estimated: >60 mL/min (ref >60)
Glucose, Bld: 386 mg/dL — ABNORMAL HIGH (ref 70–99)
Potassium: 4.5 mmol/L (ref 3.5–5.1)
Sodium: 131 mmol/L — ABNORMAL LOW (ref 135–145)
Total Bilirubin: 0.7 mg/dL (ref 0.3–1.2)
Total Protein: 7.3 g/dL (ref 6.5–8.1)

## 2021-12-23 LAB — CBC WITH DIFFERENTIAL (CANCER CENTER ONLY)
Abs Immature Granulocytes: 0.01 10*3/uL (ref 0.00–0.07)
Basophils Absolute: 0 10*3/uL (ref 0.0–0.1)
Basophils Relative: 1 %
Eosinophils Absolute: 0.1 10*3/uL (ref 0.0–0.5)
Eosinophils Relative: 3 %
HCT: 35.4 % — ABNORMAL LOW (ref 39.0–52.0)
Hemoglobin: 12.5 g/dL — ABNORMAL LOW (ref 13.0–17.0)
Immature Granulocytes: 0 %
Lymphocytes Relative: 25 %
Lymphs Abs: 0.7 10*3/uL (ref 0.7–4.0)
MCH: 32.6 pg (ref 26.0–34.0)
MCHC: 35.3 g/dL (ref 30.0–36.0)
MCV: 92.2 fL (ref 80.0–100.0)
Monocytes Absolute: 0.4 10*3/uL (ref 0.1–1.0)
Monocytes Relative: 15 %
Neutro Abs: 1.6 10*3/uL — ABNORMAL LOW (ref 1.7–7.7)
Neutrophils Relative %: 56 %
Platelet Count: 102 10*3/uL — ABNORMAL LOW (ref 150–400)
RBC: 3.84 MIL/uL — ABNORMAL LOW (ref 4.22–5.81)
RDW: 13.2 % (ref 11.5–15.5)
WBC Count: 2.8 10*3/uL — ABNORMAL LOW (ref 4.0–10.5)
nRBC: 0 % (ref 0.0–0.2)

## 2021-12-23 LAB — TOTAL PROTEIN, URINE DIPSTICK: Protein, ur: NEGATIVE mg/dL

## 2021-12-23 MED ORDER — HYDROCODONE-ACETAMINOPHEN 5-325 MG PO TABS: 1.0000 | ORAL_TABLET | Freq: Two times a day (BID) | ORAL | 0 refills | Status: DC | PRN

## 2021-12-23 NOTE — Progress Notes (Signed)
[place]   [phone] [phone]   Clinic Follow up Note   Patient Care Team: Pa, [facility] as PCP - General (Family Medicine) [name], MD as Consulting Physician (Hematology and Oncology) [name], RN (Inactive) as Oncology Nurse Navigator  Date of Service:  12/23/2021  CHIEF COMPLAINT: f/u of multifocal [place]  CURRENT THERAPY:  -s/p Y90 on 07/01/21 and 07/22/21  ASSESSMENT & PLAN:  Shawn Bailey is a 76 y.o. male with   1.  Multifocal hepatocellular carcinoma, cT3N0M, stage IIIA -Liver MRI 03/06/21 showed a large 9.5 cm and a 1.3 cm liver mass is seen in right lobe, biopsy 03/08/21 confirmed moderately differentiated hepatocellular carcinoma.  No nodal or distant metastasis on CT 03/05/21. -This is likely related to his HCV infection -He is not a candidate for liver resection or transplant. Dr. [name] at [facility] did not recommend surgery given the location and extent of disease. -He started atezolizumab and bevacizumab on 04/28/21, while waiting for liver radioembolization. He tolerates this well with mild fatigue. He overall feels better after started treatment  -His AFP has dropped significantly which likely indicating good response  -he underwent Y90 on 07/22/21 and on 08/31/21. -restaging CT AP during hospitalization on 11/02/21 showed: minimal decrease in right hepatic mass; no new hepatic mass; new small amount ascites in RUQ. We will plan for repeat in 3 months -we discontinued atezolizumab and bevacizumab due to severe hyperglycemia. Will continue monitoring his disease for now, plan to repeat staging scan on next visit, if he has disease progression, we will plan to start him on lenvatinib.  If he has stable disease on next scan, will plan to continue monitoring.   2. Symptom management: Weight loss, decreased appetite, fatigue, pain -Secondary to liver cancer -he has diminished appetite and taste changes, causing  weight loss. He is supplementing with 2-3 Ensure a day. He will look into other options given his recent hyperglycemia. -He will continue follow with our nutritionist as needed -he reports using Norco up to two times a day, usually only once at night.   3. 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 His BG in the office today was 386. I instructed him to call his PCP to adjust his insulin and DM mets.  Since he has been off immunotherapy for some time, his diabetes may improve   4. Untreated HCV  -During 02/2021 Hospitalization, labs showed HIV non reactive and hepatitis panel hep C ab positive, he was not aware that he has HCV infection -due to his recent [facility] diagnosis, will hold off HCV treatment for now -he does not see any doctors regularly. I previously referred him to the liver clinic for evaluation of his cancer, HCV, and for endoscopy when needed. This has not yet been scheduled.     PLAN: -f/u in 6 weeks with lab and CT several days before   No problem-specific Assessment & Plan notes found for this encounter.   SUMMARY OF ONCOLOGIC HISTORY: Oncology History Overview Note  Cancer Staging Hepatocellular carcinoma [facility]) Staging form: Liver, AJCC 8th Edition - Clinical stage from 03/08/2021: Stage IIIA (cT3, cN0, cM0) - Signed by [name], MD on 03/11/2021 Stage prefix: Initial diagnosis Histologic grade (G): G2 Histologic grading system: 4 grade system     Hepatocellular carcinoma ([place])  Initial Diagnosis   Liver mass, right lobe   03/05/2021 Imaging   CT CAP  IMPRESSION: 1. Right hepatic lobe masses which are suspicious for either metastatic disease or primary malignancy such as hepatocellular carcinoma. These are suboptimally evaluated on this noncontrast exam. Consider multidisciplinary oncology  consultation with potential clinical strategies of tissue sampling versus PET to direct sampling. 2. No primary malignancy identified within the chest, abdomen, or pelvis, given limitation of noncontrast exam. 3. Left nephrolithiasis. 4. Coronary artery atherosclerosis. Aortic Atherosclerosis (ICD10-I70.0). 5. Esophageal air fluid level suggests dysmotility or gastroesophageal reflux.   03/06/2021 Imaging   MRI Liver  IMPRESSION: 9.5 cm heterogeneous hypovascular mass in anterior right hepatic lobe, and 1.3 cm hypervascular mass in the posterior right hepatic lobe. Differential diagnosis includes liver metastases and multifocal hepatocellular carcinoma. Suggest correlation with serum AFP level, and possible tissue sampling.   No evidence of abdominal metastatic disease.   03/08/2021 Initial Biopsy   A. LIVER, RIGHT, BIOPSY:  - Hepatocellular carcinoma.   COMMENT:  The hepatocellular carcinoma is moderately differentiated and associated with areas of necrosis.  Immunohistochemistry shows positivity with glypican-3 and weak staining with HepPar 1.  The tumor is negative with arginase 1, alpha-fetoprotein, CDX2, cytokeratin 7, cytokeratin 20, cytokeratin AE1/AE3, PSA, prostein and MOC31.  Polyclonal CEA shows focal canalicular pattern.  The morphology and immunophenotype are consistent with hepatocellular carcinoma.    03/08/2021 Cancer Staging   Staging form: Liver, AJCC 8th Edition - Clinical stage from 03/08/2021: Stage IIIA (cT3, cN0, cM0) - Signed by [name], MD on 03/11/2021 Stage prefix: Initial diagnosis Histologic grade (G): G2 Histologic grading system: 4 grade system    04/28/2021 -  Chemotherapy   Patient is on Treatment Plan : LUNG Atezolizumab + Bevacizumab q21d Maintenance     06/16/2021 Imaging   CT Abdomen  IMPRESSION: Marked interval decrease in size of the large RIGHT hepatic lobe lesion, biopsy-proven hepatocellular carcinoma. No arterial phase enhancement on  early phase, vague enhancement on later phase greater than 20 Hounsfield unit enhancement on venous phase. Less pronounced arterial enhancement than on previous imaging with small peripheral foci of enhancement on arterial phase particularly along the margin of the tumor near the dome of the liver (image 12/2) 11 mm focus of enhancement in this area.   Interval response to therapy of a small lesion previously compatible with LI-RADS category 5 lesion in the posterior RIGHT hepatic lobe.   No new hepatic lesion.   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   Signs of nephrolithiasis calculus on the LEFT measuring approximately 7 mm.   Colonic diverticulosis without evidence of acute diverticulitis.   Aortic Atherosclerosis (ICD10-I70.0).   11/02/2021 Imaging   EXAM: CT ABDOMEN AND PELVIS WITH CONTRAST   IMPRESSION: 1. Right hepatic mass has minimally decreased in size. No new focal hepatic masses are seen. 2. New small amount of ascites in the right upper quadrant. 3. Wall thickening of the cecum and ascending colon versus normal under distension. Correlate for nonspecific colitis. 4. Nonobstructing left renal calculus. 5.  Aortic Atherosclerosis (ICD10-I70.0).      INTERVAL HISTORY:  Shawn Bailey is here for a follow up of [place]. He was last seen by me on 11/24/21. He presents to the clinic accompanied by his ex-wife and her husband. She reports his BG can go very low, down to 30.   All other systems were reviewed with the patient and are negative.  MEDICAL HISTORY:  Past Medical History:  Diagnosis  Date   Depression    Hepatocellular carcinoma ([place]) 03/08/2021   Parasite infestation     SURGICAL HISTORY: Past Surgical History:  Procedure Laterality Date   IR ANGIOGRAM SELECTIVE EACH ADDITIONAL VESSEL  07/01/2021   IR ANGIOGRAM SELECTIVE EACH ADDITIONAL VESSEL  07/22/2021   IR ANGIOGRAM SELECTIVE EACH ADDITIONAL VESSEL  07/22/2021   IR ANGIOGRAM  SELECTIVE EACH ADDITIONAL VESSEL  07/22/2021   IR ANGIOGRAM SELECTIVE EACH ADDITIONAL VESSEL  07/22/2021   IR ANGIOGRAM VISCERAL SELECTIVE  07/01/2021   IR ANGIOGRAM VISCERAL SELECTIVE  07/22/2021   IR EMBO ARTERIAL NOT HEMORR HEMANG INC GUIDE ROADMAPPING  07/01/2021   IR EMBO TUMOR ORGAN ISCHEMIA INFARCT INC GUIDE ROADMAPPING  07/22/2021   IR RADIOLOGIST EVAL & MGMT  06/01/2021   IR US GUIDE VASC ACCESS RIGHT  07/01/2021   IR US GUIDE VASC ACCESS RIGHT  07/22/2021   NO PAST SURGERIES      I have reviewed the social history and family history with the patient and they are unchanged from previous note.  ALLERGIES:  has No Known Allergies.  MEDICATIONS:  Current Outpatient Medications  Medication Sig Dispense Refill   Ascorbic Acid (VITAMIN C) 100 MG tablet Take 100 mg by mouth daily.     blood glucose meter kit and supplies Dispense based on patient and insurance preference. Use up to four times daily as directed. (FOR ICD-10 E10.9, E11.9). 1 each 0   HYDROcodone-acetaminophen (NORCO) 5-325 MG tablet Take 1 tablet by mouth every 8 (eight) hours as needed for moderate pain. 60 tablet 0   Insulin Pen Needle (PEN NEEDLES 3/16") 31G X 5 MM MISC 1 each by Does not apply route with breakfast, with lunch, and with evening meal. 100 each 3   metFORMIN (GLUCOPHAGE) 500 MG tablet Take 1 tablet (500 mg total) by mouth 2 (two) times daily with a meal. 60 tablet 3   Multiple Vitamin (MULTIVITAMIN WITH MINERALS) TABS tablet Take 1 tablet by mouth daily.     ondansetron (ZOFRAN) 8 MG tablet Take 1 tablet (8 mg total) by mouth every 8 (eight) hours as needed for nausea or vomiting. 30 tablet 0   polyethylene glycol (MIRALAX / GLYCOLAX) 17 g packet Take 17 g by mouth daily as needed for mild constipation. 14 each 0   prochlorperazine (COMPAZINE) 10 MG tablet Take 1 tablet (10 mg total) by mouth every 6 (six) hours as needed for nausea or vomiting. 30 tablet 0   senna (SENOKOT) 8.6 MG TABS tablet Take 1 tablet (8.6  mg total) by mouth 2 (two) times daily. (Patient taking differently: Take 1 tablet by mouth 2 (two) times daily as needed for moderate constipation.) 120 tablet 0   vitamin B-12 (CYANOCOBALAMIN) 100 MCG tablet Take 100 mcg by mouth daily.     No current facility-administered medications for this visit.   Facility-Administered Medications Ordered in Other Visits  Medication Dose Route Frequency Provider Last Rate Last Admin   0.9 %  sodium chloride infusion   Intravenous Once [name], MD       0.9 %  sodium chloride infusion   Intravenous Continuous [name], MD        PHYSICAL EXAMINATION: ECOG PERFORMANCE STATUS: 1 - Symptomatic but completely ambulatory  There were no vitals filed for this visit. Wt Readings from Last 3 Encounters:  11/24/21 121 lb (54.9 kg)  10/31/21 126 lb (57.2 kg)  10/13/21 126 lb 12.8 oz (57.5 kg)     GENERAL:alert, no distress and  comfortable SKIN: skin color normal, no rashes or significant lesions EYES: normal, Conjunctiva are pink and non-injected, sclera clear  NEURO: alert & oriented x 3 with fluent speech  LABORATORY DATA:  I have reviewed the data as listed CBC Latest Ref Rng & Units 12/23/2021 11/24/2021 11/04/2021  WBC 4.0 - 10.5 K/uL 2.8(L) 3.0(L) 5.0  Hemoglobin 13.0 - 17.0 g/dL 12.5(L) 13.1 14.3  Hematocrit 39.0 - 52.0 % 35.4(L) 37.7(L) 41.0  Platelets 150 - 400 K/uL 102(L) 74(L) 86(L)     CMP Latest Ref Rng & Units 11/24/2021 11/04/2021 11/03/2021  Glucose 70 - 99 mg/dL 353(H) 258(H) 353(H)  BUN 8 - 23 mg/dL 14 18 16   Creatinine 0.61 - 1.24 mg/dL 0.72 0.78 0.75  Sodium 135 - 145 mmol/L 131(L) 131(L) 129(L)  Potassium 3.5 - 5.1 mmol/L 4.1 3.7 3.9  Chloride 98 - 111 mmol/L 100 100 99  CO2 22 - 32 mmol/L 24 20(L) 17(L)  Calcium 8.9 - 10.3 mg/dL 9.4 9.4 8.6(L)  Total Protein 6.5 - 8.1 g/dL 7.3 7.6 -  Total Bilirubin 0.3 - 1.2 mg/dL 1.1 1.1 -  Alkaline Phos 38 - 126 U/L 55 52 -  AST 15 - 41 U/L 82(H) 65(H) -  ALT 0 - 44 U/L 62(H) 59(H) -       RADIOGRAPHIC STUDIES: I have personally reviewed the radiological images as listed and agreed with the findings in the report. No results found.    Orders Placed This Encounter  Procedures   CT ABDOMEN PELVIS W CONTRAST    Standing Status:   Future    Standing Expiration Date:   12/24/2022    Order Specific Question:   If indicated for the ordered procedure, I authorize the administration of contrast media per Radiology protocol    Answer:   Yes    Order Specific Question:   Preferred imaging location?    Answer:   [facility]    Order Specific Question:   Is Oral Contrast requested for this exam?    Answer:   Yes, Per Radiology protocol   All questions were answered. The patient knows to call the clinic with any problems, questions or concerns. No barriers to learning was detected. The total time spent in the appointment was 30 minutes.     [name], MD 12/23/2021   I, Wilburn Mylar, am acting as scribe for [name], MD.   I have reviewed the above documentation for accuracy and completeness, and I agree with the above.

## 2021-12-23 NOTE — Telephone Encounter (Signed)
Notified patient of prior authorization approval for Hydrocodone 5/'325mg'$  Tablets. Medication is authorized through 10/16/2022. No other needs or concerns verbalized at this time. Pharmacy of choice notified. ?

## 2021-12-26 ENCOUNTER — Encounter: Payer: Self-pay | Admitting: Hematology

## 2022-01-28 ENCOUNTER — Ambulatory Visit (HOSPITAL_COMMUNITY)
Admission: RE | Admit: 2022-01-28 | Discharge: 2022-01-28 | Disposition: A | Discharge status: Home / Self Care | Payer: 59 | Source: Ambulatory Visit | Attending: Hematology | Admitting: Hematology

## 2022-01-28 ENCOUNTER — Other Ambulatory Visit: Payer: Self-pay

## 2022-01-28 ENCOUNTER — Inpatient Hospital Stay: Payer: 59 | Attending: Hematology

## 2022-01-28 DIAGNOSIS — C220 Liver cell carcinoma: Secondary | ICD-10-CM | POA: Insufficient documentation

## 2022-01-28 DIAGNOSIS — E119 Type 2 diabetes mellitus without complications: Secondary | ICD-10-CM | POA: Insufficient documentation

## 2022-01-28 LAB — CBC WITH DIFFERENTIAL (CANCER CENTER ONLY)
Abs Immature Granulocytes: 0.01 10*3/uL (ref 0.00–0.07)
Basophils Absolute: 0 10*3/uL (ref 0.0–0.1)
Basophils Relative: 1 %
Eosinophils Absolute: 0.1 10*3/uL (ref 0.0–0.5)
Eosinophils Relative: 3 %
HCT: 37.4 % — ABNORMAL LOW (ref 39.0–52.0)
Hemoglobin: 12.4 g/dL — ABNORMAL LOW (ref 13.0–17.0)
Immature Granulocytes: 0 %
Lymphocytes Relative: 27 %
Lymphs Abs: 0.9 10*3/uL (ref 0.7–4.0)
MCH: 31.1 pg (ref 26.0–34.0)
MCHC: 33.2 g/dL (ref 30.0–36.0)
MCV: 93.7 fL (ref 80.0–100.0)
Monocytes Absolute: 0.5 10*3/uL (ref 0.1–1.0)
Monocytes Relative: 14 %
Neutro Abs: 1.8 10*3/uL (ref 1.7–7.7)
Neutrophils Relative %: 55 %
Platelet Count: 94 10*3/uL — ABNORMAL LOW (ref 150–400)
RBC: 3.99 MIL/uL — ABNORMAL LOW (ref 4.22–5.81)
RDW: 12.4 % (ref 11.5–15.5)
WBC Count: 3.3 10*3/uL — ABNORMAL LOW (ref 4.0–10.5)
nRBC: 0 % (ref 0.0–0.2)

## 2022-01-28 LAB — CMP (CANCER CENTER ONLY)
ALT: 34 U/L (ref 0–44)
AST: 37 U/L (ref 15–41)
Albumin: 4 g/dL (ref 3.5–5.0)
Alkaline Phosphatase: 62 U/L (ref 38–126)
Anion gap: 5 (ref 5–15)
BUN: 14 mg/dL (ref 8–23)
CO2: 28 mmol/L (ref 22–32)
Calcium: 9.7 mg/dL (ref 8.9–10.3)
Chloride: 97 mmol/L — ABNORMAL LOW (ref 98–111)
Creatinine: 0.84 mg/dL (ref 0.61–1.24)
GFR, Estimated: >60 mL/min (ref >60)
Glucose, Bld: 383 mg/dL — ABNORMAL HIGH (ref 70–99)
Potassium: 4 mmol/L (ref 3.5–5.1)
Sodium: 130 mmol/L — ABNORMAL LOW (ref 135–145)
Total Bilirubin: 0.7 mg/dL (ref 0.3–1.2)
Total Protein: 8.1 g/dL (ref 6.5–8.1)

## 2022-01-28 IMAGING — CT CT ABD-PELV W/ CM
2 of 11 series · 11 of 46 positions shown, 17 images · IV contrast (APPLIED)
Comparison: CT abdomen and pelvis dated [REDACTED] [I2]

CLINICAL DATA: Hepatocellular carcinoma; * Tracking Code: BO *

EXAM:
CT ABDOMEN AND PELVIS WITH CONTRAST
TECHNIQUE: Multidetector CT imaging of the abdomen and pelvis was performed
using the standard protocol following bolus administration of
intravenous contrast.

[Series 5: coronal arterial · coronal · arterial · 0.44mm/px · 2 of 81 slices shown]
[im 27/81  soft-tissue]
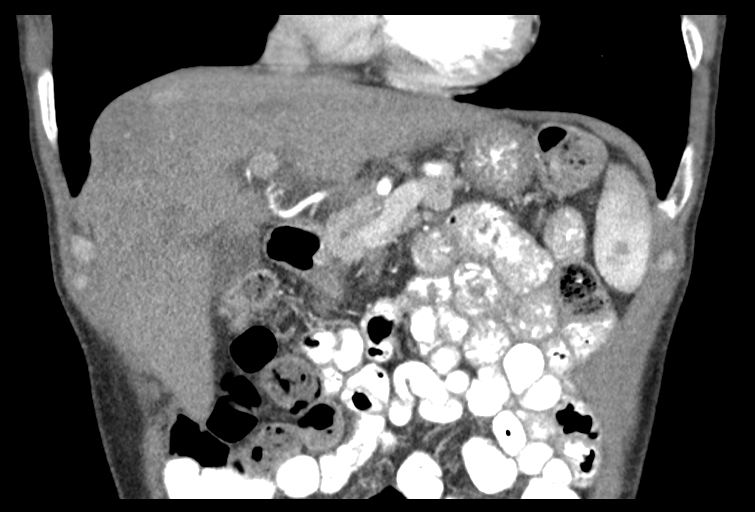
[im 54/81  soft-tissue]
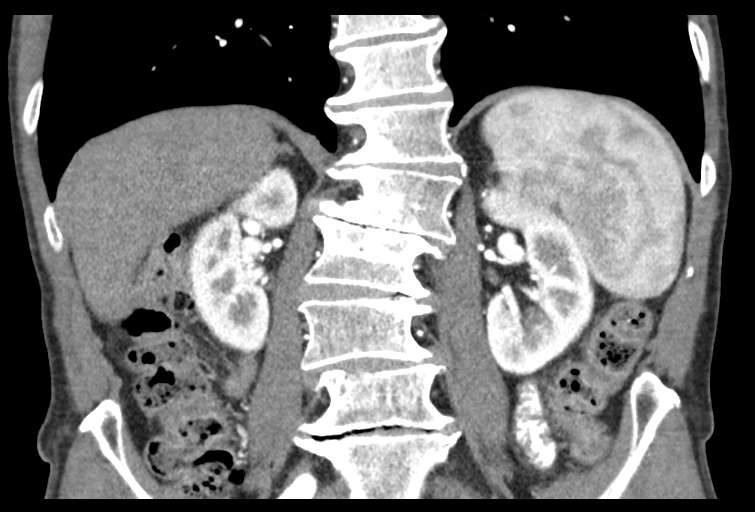

[Series 7: axial venous · axial · portal-venous · 0.72mm/px · z∈[+1090,+1393]mm · 9 of 125 slices shown, 15 images]
[im 12/125  soft-tissue]
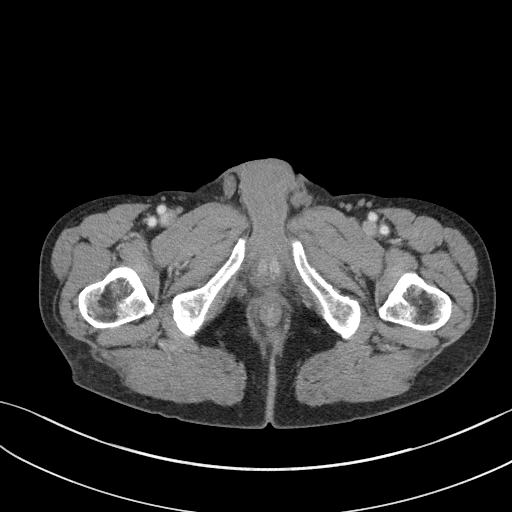
[im 12/125  bone]
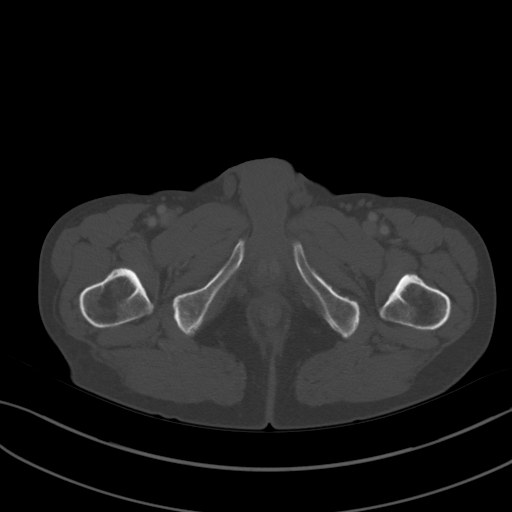
[im 23/125  soft-tissue]
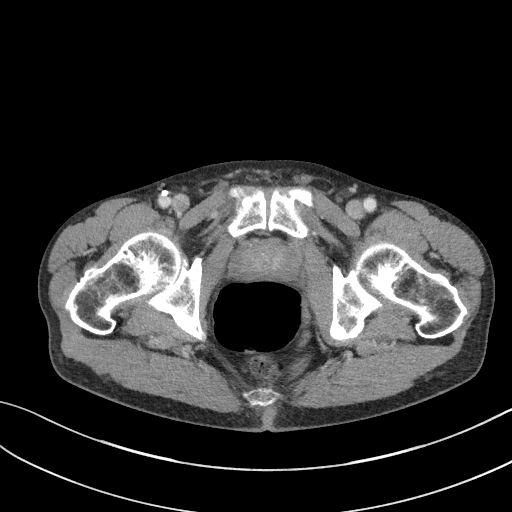
[im 34/125  soft-tissue]
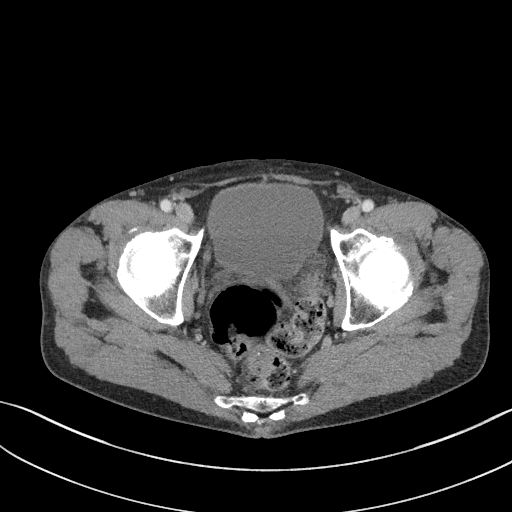
[im 46/125  soft-tissue]
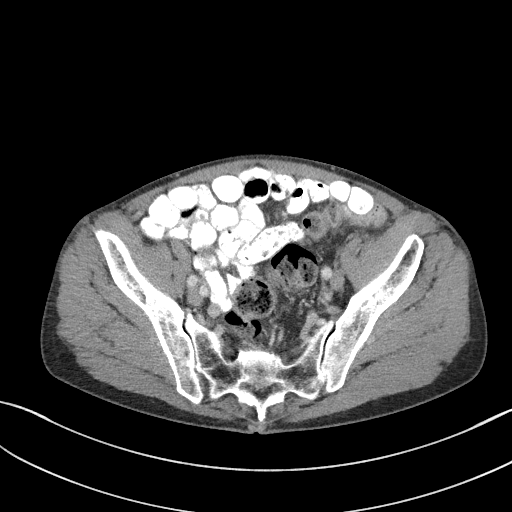
[im 68/125  soft-tissue]
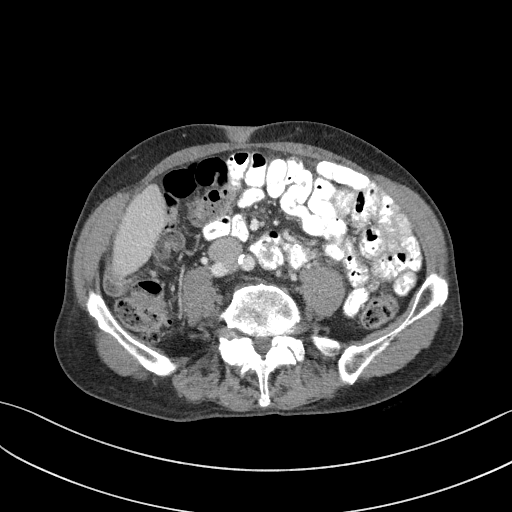
[im 79/125  soft-tissue]
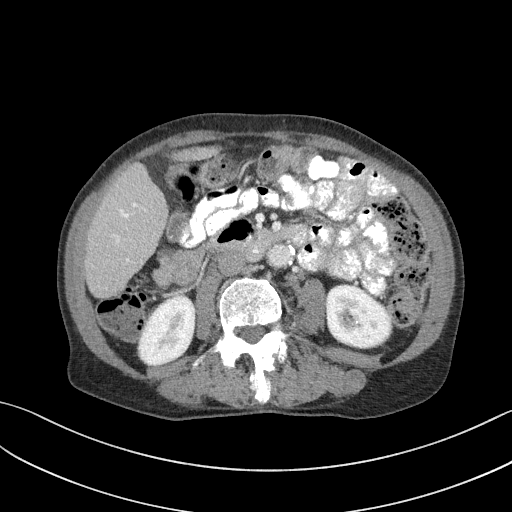
[im 79/125  lung]
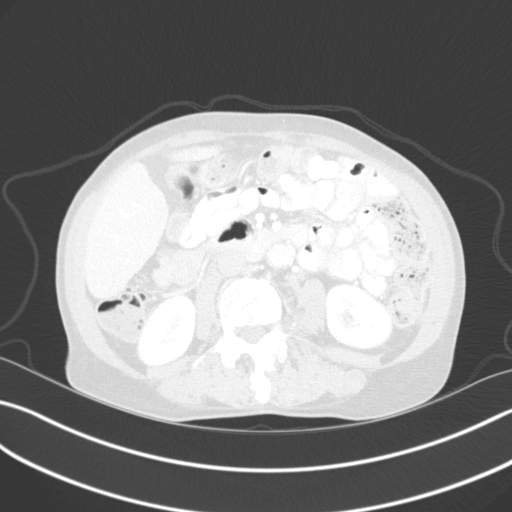
[im 91/125  soft-tissue]
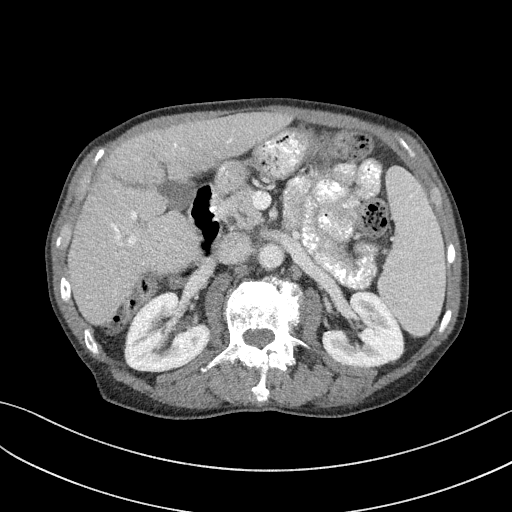
[im 91/125  lung]
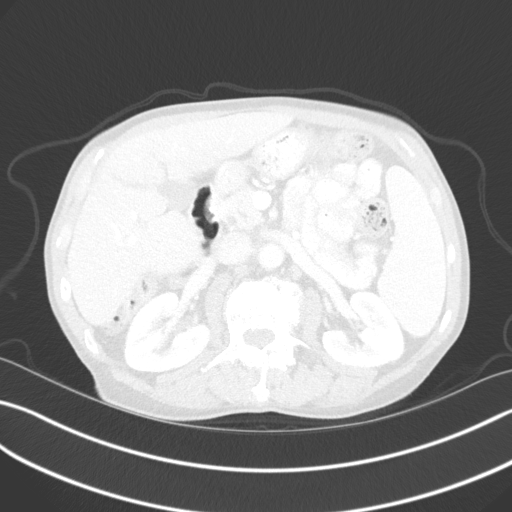
[im 102/125  soft-tissue]
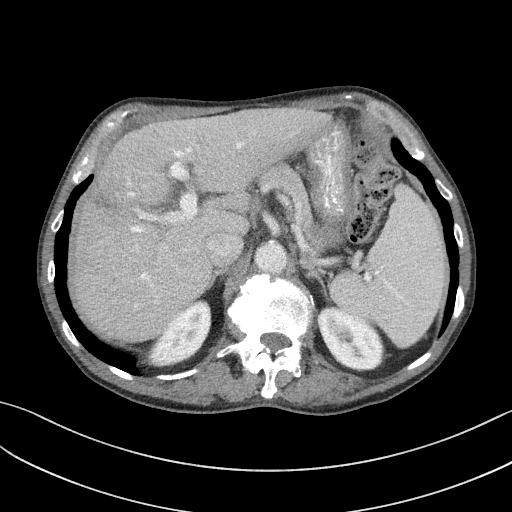
[im 102/125  lung]
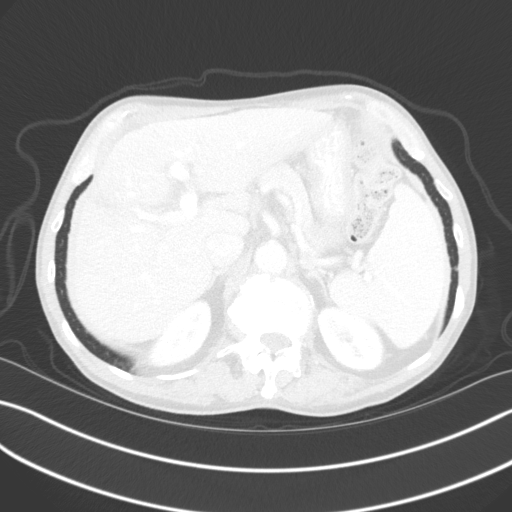
[im 113/125  soft-tissue]
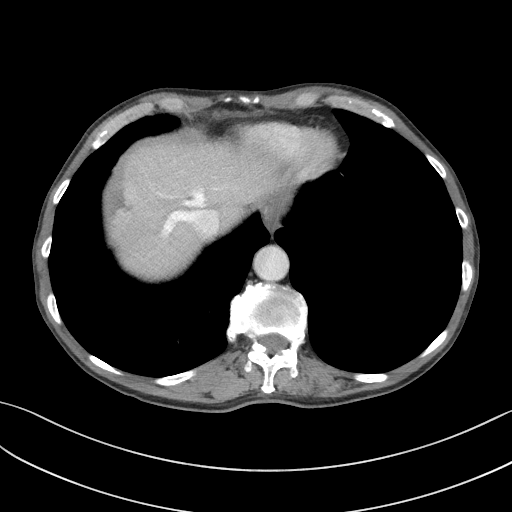
[im 113/125  lung]
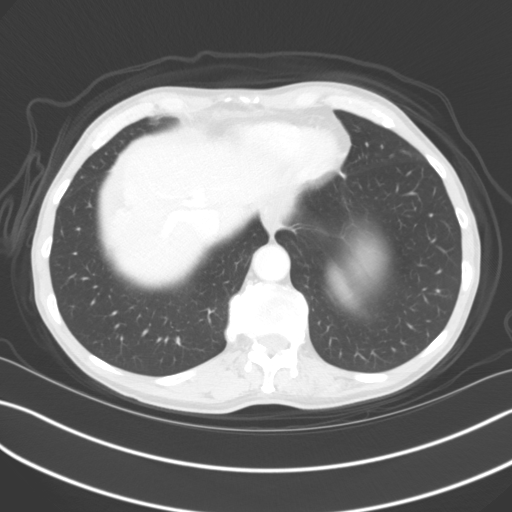
[im 113/125  bone]
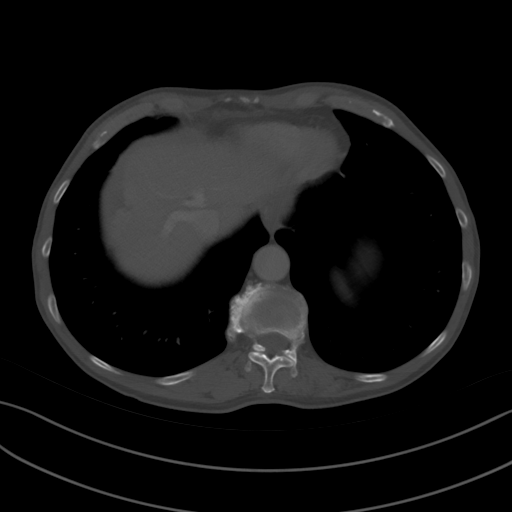

[11 of 46 positions shown; findings below may reference images not displayed]

RADIATION DOSE REDUCTION: This exam was performed according to the
departmental dose-optimization program which includes automated
exposure control, adjustment of the mA and/or kV according to
patient size and/or use of iterative reconstruction technique.

CONTRAST:  100mL OMNIPAQUE IOHEXOL 300 MG/ML  SOLN
FINDINGS: Lower chest: No acute abnormality.

Hepatobiliary: Mass of the peripheral right hepatic lobe (segment
VIII) measures 5.2 x 3.4 x 3.2 cm, previously 5.2 x 3.1 x 3.5,
unchanged in size when accounting for differences in technique. New
arterially enhancing lesion of the right hepatic dome (segment VII)
measuring 8 mm on series 2, image 17, no evidence of washout on
delayed imaging. Additional smaller focal arterially enhancing
lesion is seen separate from but near the dominant right hepatic
lobe mass in segment VIII measuring 5 mm on series 2, image 19 with
no evidence of washout. Gallbladder is unremarkable. No biliary
ductal dilation.

Pancreas: Unremarkable. No pancreatic ductal dilatation or
surrounding inflammatory changes.

Spleen: Normal in size without focal abnormality.

Adrenals/Urinary Tract: Bilateral adrenal glands are unremarkable.
Kidneys enhance symmetrically with no evidence of hydronephrosis.
Nonobstructing calculus of the upper pole of the left kidney.
Bladder is unremarkable.

Stomach/Bowel: Stomach is within normal limits. Appendix appears
normal. No evidence of bowel wall thickening, distention, or
inflammatory changes.

Vascular/Lymphatic: Aortic atherosclerosis. No enlarged abdominal or
pelvic lymph nodes.

Reproductive: Mild prostatomegaly.

Other: No abdominal wall hernia or abnormality. No abdominopelvic
ascites.

Musculoskeletal: No aggressive appearing osseous lesions.
IMPRESSION: 1. Stable size of right hepatic mass, demonstrates mild perilesional
enhancement with no internal enhancement (LRTR non-viable).
2. Arterially enhancing lesion of the right hepatic dome (segment
VII) measuring 8 mm, no evidence of washout (LR-3).
3. Smaller arterially enhancing lesion is seen in the right hepatic
lobe (segment VII) measuring 5 mm with no evidence of washout
(LR-3).
4.  Aortic Atherosclerosis ([I2]-[I2]).

## 2022-01-28 MED ORDER — IOHEXOL 300 MG/ML  SOLN
100.0000 mL | Freq: Once | INTRAMUSCULAR | Status: AC | PRN
  Administered 2022-01-28: 100 mL via INTRAVENOUS

## 2022-01-28 MED ORDER — SODIUM CHLORIDE (PF) 0.9 % IJ SOLN
INTRAMUSCULAR | Status: AC
  Filled 2022-01-28: qty 50

## 2022-01-29 LAB — AFP TUMOR MARKER: AFP, Serum, Tumor Marker: 4.7 ng/mL (ref 0.0–8.4)

## 2022-01-31 ENCOUNTER — Inpatient Hospital Stay (HOSPITAL_BASED_OUTPATIENT_CLINIC_OR_DEPARTMENT_OTHER): Payer: 59 | Admitting: Hematology

## 2022-01-31 ENCOUNTER — Other Ambulatory Visit: Payer: Self-pay

## 2022-01-31 ENCOUNTER — Encounter: Payer: Self-pay | Admitting: Hematology

## 2022-01-31 VITALS — BP 169/95 | HR 82 | Temp 98.5°F | Resp 18 | Ht 65.0 in | Wt 130.7 lb

## 2022-01-31 DIAGNOSIS — C220 Liver cell carcinoma: Secondary | ICD-10-CM

## 2022-01-31 DIAGNOSIS — E119 Type 2 diabetes mellitus without complications: Secondary | ICD-10-CM | POA: Diagnosis not present

## 2022-01-31 NOTE — Progress Notes (Signed)
?[place]   ?[phone] [phone]   ?Clinic Follow up Note  ? ?Patient Care Team: ?[facility], [facility] as PCP - General (Family Medicine) ?[name], MD as Consulting Physician (Hematology and Oncology) ?[name], RN (Inactive) as Oncology Nurse Navigator ? ?Date of Service:  01/31/2022 ? ?CHIEF COMPLAINT: f/u of multifocal HCC ? ?CURRENT THERAPY:  ?Surveillance ?-s/p Y90 on 07/01/21 and 07/22/21 ? ?ASSESSMENT & PLAN:  ?Shawn Bailey is a 76 y.o. male with  ? ?1.  Multifocal hepatocellular carcinoma, cT3N0M, stage IIIA ?-Liver MRI 03/06/21 showed a large 9.5 cm and a 1.3 cm liver mass is seen in right lobe, biopsy 03/08/21 confirmed moderately differentiated hepatocellular carcinoma.  No nodal or distant metastasis on CT 03/05/21. ?-This is likely related to his HCV infection ?-He is not a candidate for liver resection or transplant. Dr. [name] at [facility] did not recommend surgery given the location and extent of disease. ?-He started atezolizumab and bevacizumab on 04/28/21, while waiting for liver radioembolization. He tolerates this well with mild fatigue. He overall feels better after started treatment  ?-His AFP has dropped significantly which likely indicating good response  ?-he underwent Y90 on 07/22/21 and on 08/31/21. ?-restaging CT AP on 01/28/22 showed: stable primary right hepatic mass; two new small hepatic lesions, 8 mm and 5 mm which are indeterminate (LR-3). I discussed the results with them today. Since the two new lesions are indeterminate and he is asymptomatic, we will monitor with MRI in 2 months. ?-Lab reviewed, overall stable.  His AFP has come down to normal, which is a good indicator of response. ?-pt is clinically doing well, will hold on second line treatment until definitive evidence of cancer progression ?  ?2. 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 ?  ?3. Untreated HCV  ?-During 02/2021 Hospitalization, labs showed HIV non reactive and hepatitis panel hep C ab positive, he was not aware that he has HCV infection ?-due to his recent [facility] diagnosis, will hold off HCV treatment for now ?-he does not see any doctors regularly. I previously referred him to the liver clinic for evaluation of his cancer, HCV, and for endoscopy when needed. This has not yet been scheduled. ?  ?  ?PLAN: ?-lab and f/u in 8 weeks with MRI several days before ? ? ?No problem-specific Assessment & Plan notes found for this encounter. ? ? ?SUMMARY OF ONCOLOGIC HISTORY: ?Oncology History Overview Note  ?Cancer Staging ?Hepatocellular carcinoma ([place]) ?Staging form: Liver, AJCC 8th Edition ?- Clinical stage from 03/08/2021: Stage IIIA (cT3, cN0, cM0) - Signed by [name], MD on 03/11/2021 ?Stage prefix: Initial diagnosis ?Histologic grade (G): G2 ?Histologic grading system: 4 grade system ? ? ?  ?Hepatocellular carcinoma ([place])  ? Initial Diagnosis  ? Liver mass, right lobe ?  ?03/05/2021 Imaging  ? CT CAP  ?IMPRESSION: ?1. Right hepatic lobe masses which are suspicious for either ?metastatic disease or primary malignancy such as hepatocellular ?carcinoma. These are suboptimally evaluated on this noncontrast ?exam. Consider multidisciplinary oncology consultation with ?potential clinical strategies of tissue sampling versus PET to ?direct sampling. ?2. No primary malignancy identified within the chest, abdomen, or ?pelvis, given limitation of noncontrast exam. ?3. Left nephrolithiasis. ?4. Coronary artery atherosclerosis. Aortic Atherosclerosis ?(ICD10-I70.0). ?5. Esophageal air fluid level suggests dysmotility or ?gastroesophageal reflux. ?  ?03/06/2021 Imaging  ? MRI Liver  ?IMPRESSION: ?9.5 cm heterogeneous hypovascular mass  in anterior right hepatic ?lobe, and 1.3 cm hypervascular mass in the posterior right hepatic ?lobe. Differential diagnosis includes liver  metastases and ?multifocal hepatocellular carcinoma. Suggest correlation with serum ?AFP level, and possible tissue sampling. ?  ?No evidence of abdominal metastatic disease. ?  ?03/08/2021 Initial Biopsy  ? A. LIVER, RIGHT, BIOPSY:  ?- Hepatocellular carcinoma.  ? ?COMMENT:  ?The hepatocellular carcinoma is moderately differentiated and associated with areas of necrosis.  Immunohistochemistry shows positivity with glypican-3 and weak staining with HepPar 1.  The tumor is negative with arginase 1, alpha-fetoprotein, CDX2, cytokeratin 7, cytokeratin 20, cytokeratin AE1/AE3, PSA, prostein and MOC31.  Polyclonal CEA shows focal canalicular pattern.  The morphology and immunophenotype are consistent with hepatocellular carcinoma.  ?  ?03/08/2021 Cancer Staging  ? Staging form: Liver, AJCC 8th Edition ?- Clinical stage from 03/08/2021: Stage IIIA (cT3, cN0, cM0) - Signed by [name], MD on 03/11/2021 ?Stage prefix: Initial diagnosis ?Histologic grade (G): G2 ?Histologic grading system: 4 grade system ? ?  ?04/28/2021 -  Chemotherapy  ? Patient is on Treatment Plan : LUNG Atezolizumab + Bevacizumab q21d Maintenance  ? ?  ?  ?06/16/2021 Imaging  ? CT Abdomen ? ?IMPRESSION: ?Marked interval decrease in size of the large RIGHT hepatic lobe ?lesion, biopsy-proven hepatocellular carcinoma. No arterial phase ?enhancement on early phase, vague enhancement on later phase greater than 20 Hounsfield unit enhancement on venous phase. Less pronounced arterial enhancement than on previous imaging with small peripheral foci of enhancement on arterial phase particularly along the margin of the tumor near the dome of the liver (image 12/2) 11 mm focus of enhancement in this area. ?  ?Interval response to therapy of a small lesion previously compatible ?with LI-RADS category 5 lesion in the posterior RIGHT hepatic lobe. ?  ?No new hepatic lesion. ?  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 ?  ?Signs of nephrolithiasis  calculus on the LEFT measuring approximately 7 mm. ?  ?Colonic diverticulosis without evidence of acute diverticulitis. ?  ?Aortic Atherosclerosis (ICD10-I70.0). ?  ?11/02/2021 Imaging  ? EXAM: ?CT ABDOMEN AND PELVIS WITH CONTRAST ?  ?IMPRESSION: ?1. Right hepatic mass has minimally decreased in size. No new focal hepatic masses are seen. ?2. New small amount of ascites in the right upper quadrant. ?3. Wall thickening of the cecum and ascending colon versus normal under distension. Correlate for nonspecific colitis. ?4. Nonobstructing left renal calculus. ?5.  Aortic Atherosclerosis (ICD10-I70.0). ?  ? ? ? ?INTERVAL HISTORY:  ?Shawn Bailey is here for a follow up of [place]. He was last seen by me on 12/23/21. He presents to the clinic accompanied by his ex-wife and her husband. ?They report he is doing better now. She reports his sugar is now getting under control on insulin alone. He reports he is eating well now, as well. His ex-wife notes she has been giving him advice as she is also diabetic. ?  ?All other systems were reviewed with the patient and are negative. ? ?MEDICAL HISTORY:  ?Past Medical History:  ?Diagnosis Date  ? Depression   ? Hepatocellular carcinoma ([place]) 03/08/2021  ? Parasite infestation   ? ? ?SURGICAL HISTORY: ?Past Surgical History:  ?Procedure Laterality Date  ? IR ANGIOGRAM SELECTIVE EACH ADDITIONAL VESSEL  07/01/2021  ? IR ANGIOGRAM SELECTIVE EACH ADDITIONAL VESSEL  07/22/2021  ? IR ANGIOGRAM SELECTIVE EACH ADDITIONAL VESSEL  07/22/2021  ? IR ANGIOGRAM SELECTIVE EACH ADDITIONAL VESSEL  07/22/2021  ? IR ANGIOGRAM SELECTIVE EACH ADDITIONAL VESSEL  07/22/2021  ?  IR ANGIOGRAM VISCERAL SELECTIVE  07/01/2021  ? IR ANGIOGRAM VISCERAL SELECTIVE  07/22/2021  ? IR EMBO ARTERIAL NOT HEMORR HEMANG INC GUIDE ROADMAPPING  07/01/2021  ? IR EMBO TUMOR ORGAN ISCHEMIA INFARCT INC GUIDE ROADMAPPING  07/22/2021  ? IR RADIOLOGIST EVAL & MGMT  06/01/2021  ? IR US GUIDE VASC ACCESS RIGHT  07/01/2021  ? IR US GUIDE VASC  ACCESS RIGHT  07/22/2021  ? NO PAST SURGERIES    ? ? ?I have reviewed the social history and family history with the patient and they are unchanged from previous note. ? ?ALLERGIES:  has No Known Allergies. ? ?

## 2022-03-28 ENCOUNTER — Other Ambulatory Visit: Payer: 59

## 2022-03-30 ENCOUNTER — Other Ambulatory Visit: Payer: Self-pay

## 2022-03-30 ENCOUNTER — Inpatient Hospital Stay: Payer: 59 | Attending: Hematology

## 2022-03-30 ENCOUNTER — Ambulatory Visit (HOSPITAL_COMMUNITY)
Admission: RE | Admit: 2022-03-30 | Discharge: 2022-03-30 | Disposition: A | Discharge status: Home / Self Care | Payer: 59 | Source: Ambulatory Visit | Attending: Hematology | Admitting: Hematology

## 2022-03-30 DIAGNOSIS — C220 Liver cell carcinoma: Secondary | ICD-10-CM | POA: Diagnosis present

## 2022-03-30 DIAGNOSIS — Z794 Long term (current) use of insulin: Secondary | ICD-10-CM | POA: Insufficient documentation

## 2022-03-30 DIAGNOSIS — Z79899 Other long term (current) drug therapy: Secondary | ICD-10-CM | POA: Insufficient documentation

## 2022-03-30 DIAGNOSIS — E119 Type 2 diabetes mellitus without complications: Secondary | ICD-10-CM | POA: Insufficient documentation

## 2022-03-30 LAB — CMP (CANCER CENTER ONLY)
ALT: 34 U/L (ref 0–44)
AST: 42 U/L — ABNORMAL HIGH (ref 15–41)
Albumin: 4 g/dL (ref 3.5–5.0)
Alkaline Phosphatase: 54 U/L (ref 38–126)
Anion gap: 3 — ABNORMAL LOW (ref 5–15)
BUN: 14 mg/dL (ref 8–23)
CO2: 32 mmol/L (ref 22–32)
Calcium: 10.1 mg/dL (ref 8.9–10.3)
Chloride: 102 mmol/L (ref 98–111)
Creatinine: 0.83 mg/dL (ref 0.61–1.24)
GFR, Estimated: >60 mL/min (ref >60)
Glucose, Bld: 65 mg/dL — ABNORMAL LOW (ref 70–99)
Potassium: 4.1 mmol/L (ref 3.5–5.1)
Sodium: 137 mmol/L (ref 135–145)
Total Bilirubin: 0.5 mg/dL (ref 0.3–1.2)
Total Protein: 8.1 g/dL (ref 6.5–8.1)

## 2022-03-30 LAB — CBC WITH DIFFERENTIAL (CANCER CENTER ONLY)
Abs Immature Granulocytes: 0.02 10*3/uL (ref 0.00–0.07)
Basophils Absolute: 0 10*3/uL (ref 0.0–0.1)
Basophils Relative: 1 %
Eosinophils Absolute: 0.1 10*3/uL (ref 0.0–0.5)
Eosinophils Relative: 2 %
HCT: 38.6 % — ABNORMAL LOW (ref 39.0–52.0)
Hemoglobin: 13.5 g/dL (ref 13.0–17.0)
Immature Granulocytes: 1 %
Lymphocytes Relative: 28 %
Lymphs Abs: 1.2 10*3/uL (ref 0.7–4.0)
MCH: 31.4 pg (ref 26.0–34.0)
MCHC: 35 g/dL (ref 30.0–36.0)
MCV: 89.8 fL (ref 80.0–100.0)
Monocytes Absolute: 0.6 10*3/uL (ref 0.1–1.0)
Monocytes Relative: 13 %
Neutro Abs: 2.4 10*3/uL (ref 1.7–7.7)
Neutrophils Relative %: 55 %
Platelet Count: 116 10*3/uL — ABNORMAL LOW (ref 150–400)
RBC: 4.3 MIL/uL (ref 4.22–5.81)
RDW: 12.4 % (ref 11.5–15.5)
WBC Count: 4.3 10*3/uL (ref 4.0–10.5)
nRBC: 0 % (ref 0.0–0.2)

## 2022-03-30 LAB — TOTAL PROTEIN, URINE DIPSTICK: Protein, ur: NEGATIVE mg/dL

## 2022-03-30 IMAGING — MR MR ABDOMEN WO/W CM
17 series · 48 of 48 positions shown · IV contrast (gadavist)
Comparison: Abdominal MRI [DATE]. CT the abdomen and pelvis
[DATE].

CLINICAL DATA: 75-year-old male with history of gallbladder/biliary
cancer. Liver lesion noted on prior examinations. Follow-up study.

EXAM:
MRI ABDOMEN WITHOUT AND WITH CONTRAST
TECHNIQUE: Multiplanar multisequence MR imaging of the abdomen was performed
both before and after the administration of intravenous contrast.
CONTRAST:  7.5mL GADAVIST GADOBUTROL 1 MMOL/ML IV SOLN

[Series 3: T2 · coronal · 6.0mm · 1.56mm/px · 2 of 30 slices shown]
[im 1/30]
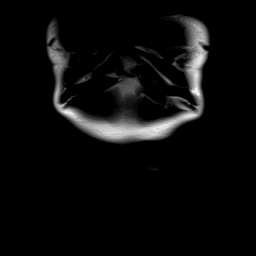
[im 30/30]
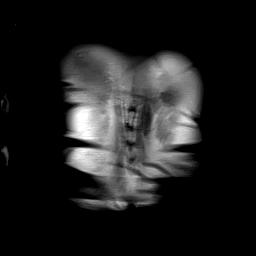

[Series 5: T2 fat-sat · axial · 6.0mm · 1.25mm/px · z∈[-101,+151]mm · 2 of 36 slices shown]
[im 1/36]
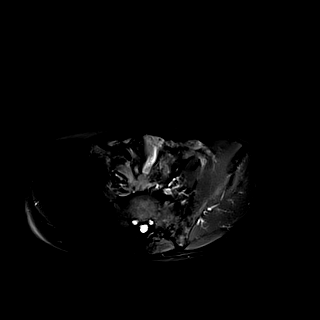
[im 36/36]
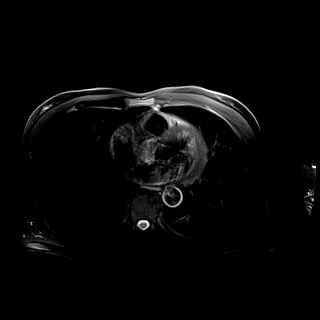

[Series 6: T1 · axial · 3.0mm · 1.25mm/px · z∈[-96,+117]mm · 4 of 72 slices shown (1 of 2)]
[im 1/72]
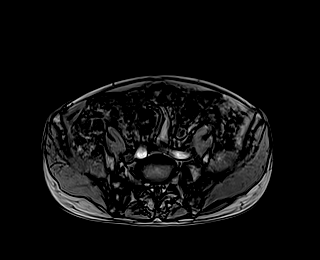
[im 24/72]
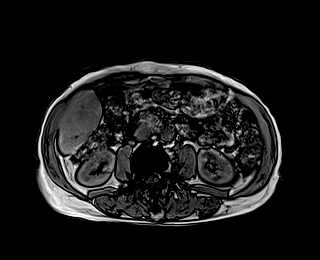
[im 48/72]
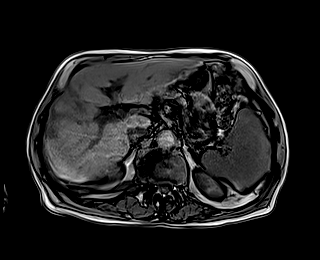
[im 72/72]
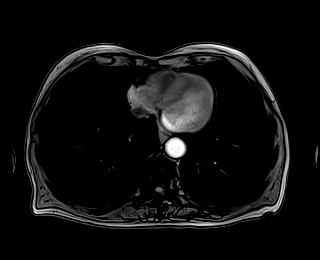

[Series 7: T1 · axial · 3.0mm · 1.25mm/px · z∈[-96,+117]mm · 4 of 72 slices shown (2 of 2)]
[im 1/72]
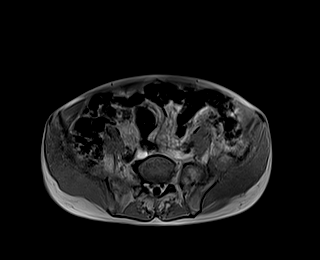
[im 24/72]
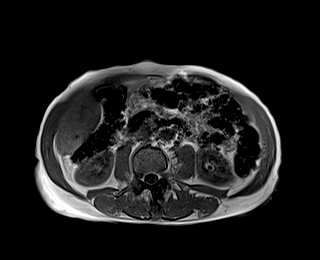
[im 48/72]
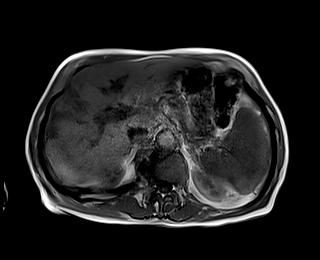
[im 72/72]
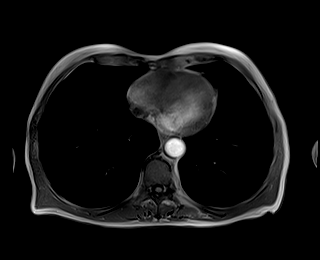

[Series 8: DWI · axial · 6.0mm · 1.49mm/px · z∈[-93,+159]mm · 3 of 72 slices shown (1 of 2)]
[im 1/72]
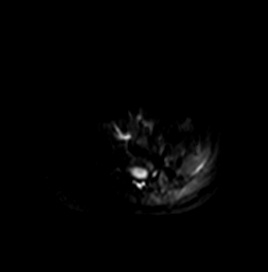
[im 36/72]
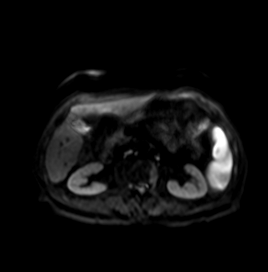
[im 72/72]
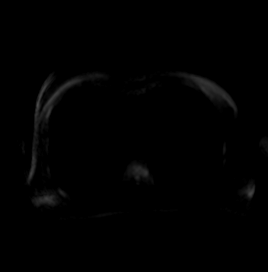

[Series 9: DWI · axial · 6.0mm · 1.49mm/px · 1 of 36 slices shown (2 of 2)]
[im 1/36]
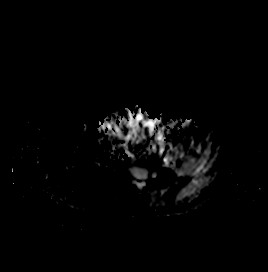

[Series 10: bSSFP · axial · 4.0mm · 0.84mm/px · z∈[-105,+111]mm · 2 of 55 slices shown]
[im 1/55]
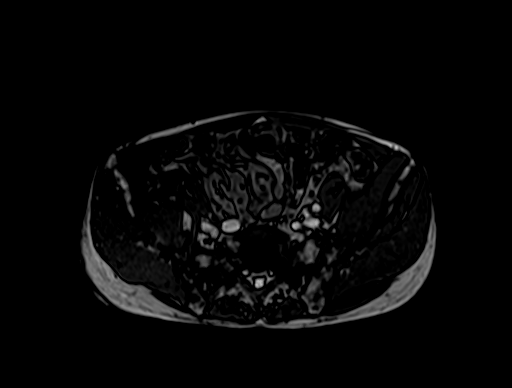
[im 55/55]
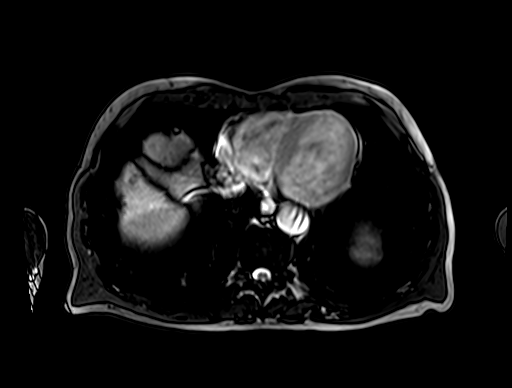

[Series 12: T1 dynamic · axial · 3.0mm · 1.25mm/px · z∈[-106,+131]mm · 3 of 80 slices shown (1 of 6)]
[im 1/80]
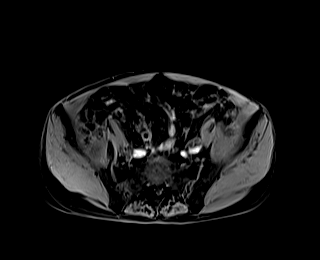
[im 40/80]
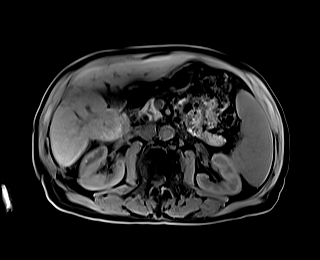
[im 80/80]
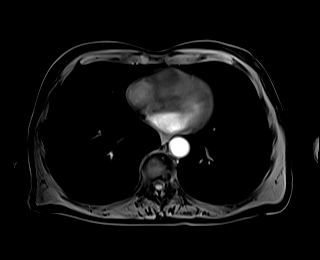

[Series 15: T1 dynamic · axial · 3.0mm · 1.25mm/px · z∈[-106,+131]mm · 3 of 80 slices shown (2 of 6)]
[im 1/80]
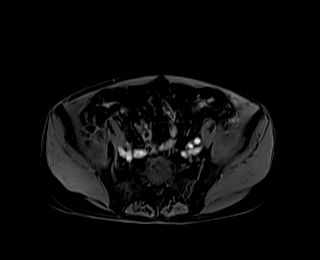
[im 40/80]
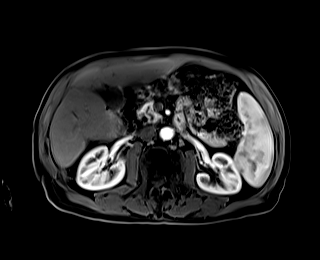
[im 80/80]
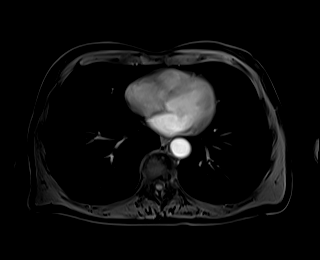

[Series 17: T1 dynamic · axial · 3.0mm · 1.25mm/px · z∈[-106,+131]mm · 3 of 80 slices shown (3 of 6)]
[im 1/80]
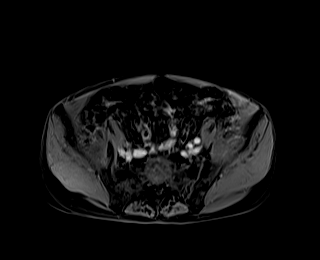
[im 40/80]
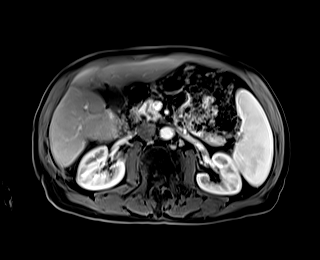
[im 80/80]
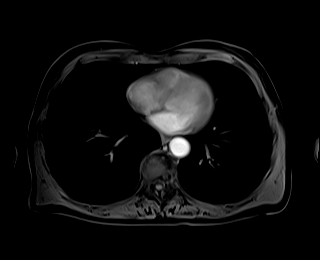

[Series 19: T1 dynamic · axial · 3.0mm · 1.25mm/px · z∈[-106,+131]mm · 3 of 80 slices shown (4 of 6)]
[im 1/80]
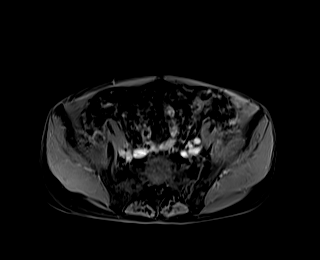
[im 40/80]
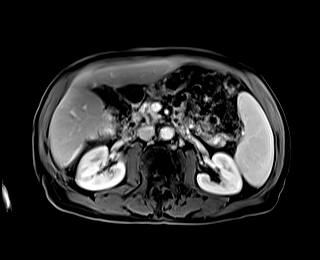
[im 80/80]
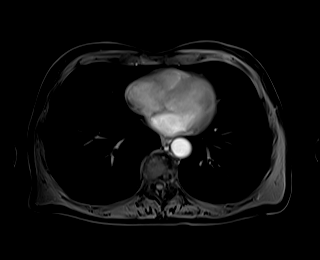

[Series 21: T1 dynamic · coronal · 3.0mm · 1.41mm/px · 3 of 72 slices shown (5 of 6)]
[im 1/72]
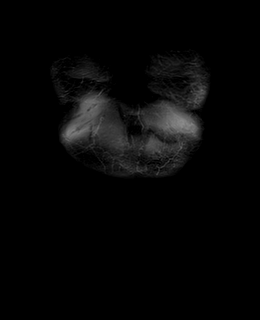
[im 36/72]
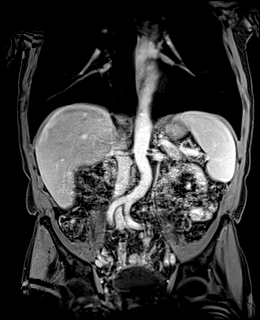
[im 72/72]
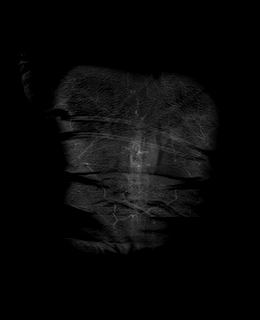

[Series 24: T1 dynamic · axial · 3.0mm · 1.25mm/px · z∈[-106,+131]mm · 3 of 80 slices shown (6 of 6)]
[im 1/80]
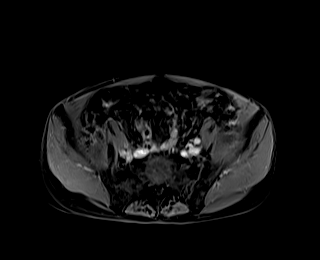
[im 40/80]
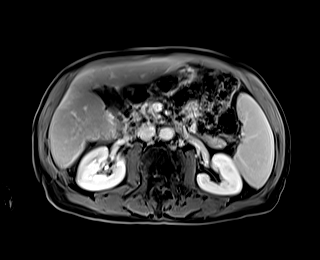
[im 80/80]
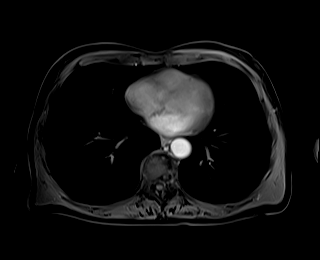

[Series 100: sub_s12-s15_1 · axial · 3.0mm · 1.25mm/px · z∈[-106,+131]mm · 3 of 80 slices shown]
[im 1/80]
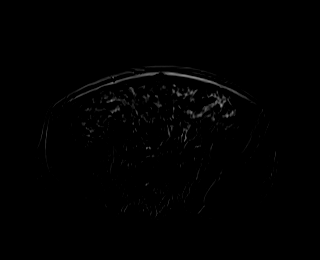
[im 40/80]
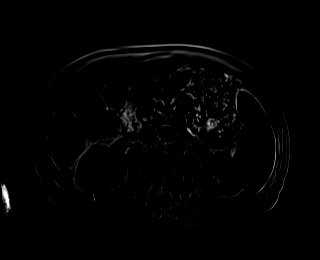
[im 80/80]
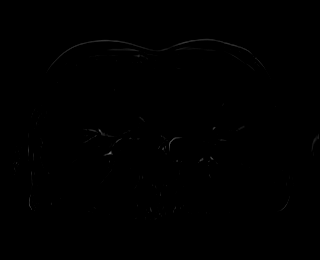

[Series 101: sub 60 · axial · 3.0mm · 1.25mm/px · z∈[-106,+131]mm · 3 of 80 slices shown]
[im 1/80]
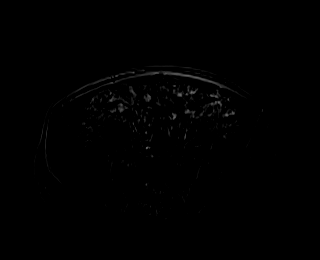
[im 40/80]
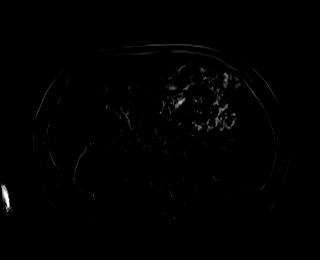
[im 80/80]
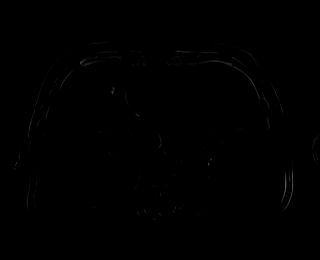

[Series 102: sub 90 · axial · 3.0mm · 1.25mm/px · z∈[-106,+131]mm · 3 of 80 slices shown]
[im 1/80]
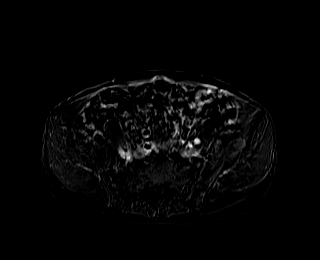
[im 40/80]
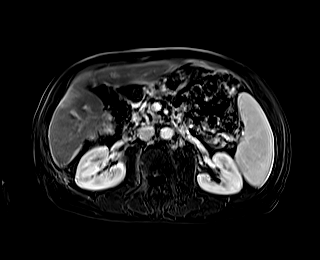
[im 80/80]
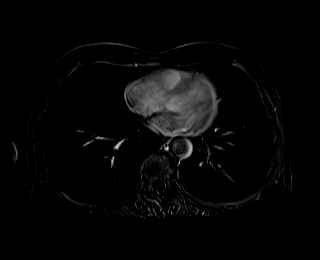

[Series 103: sub 3 min · axial · 3.0mm · 1.25mm/px · z∈[-106,+131]mm · 3 of 80 slices shown]
[im 1/80]
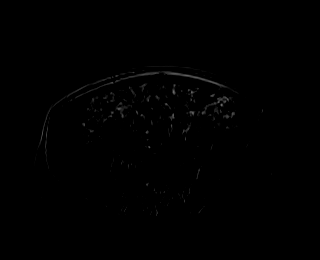
[im 40/80]
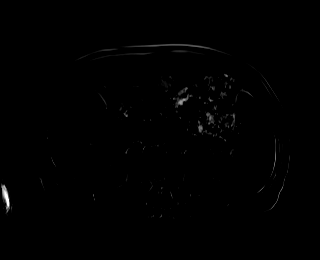
[im 80/80]
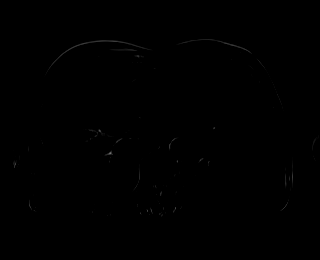

[48 of 48 positions shown; findings below may reference images not displayed]

FINDINGS: Lower chest: Unremarkable.

Hepatobiliary: Liver has a slightly nodular contour, indicative of
underlying cirrhosis. Again noted is a peripheral hypovascular area
in the right lobe of the liver involving predominantly segments 8
and 5, site of prior embolization procedure. Within this region
there is a T1 hyperintense, T2 hypointense nodule best appreciated
on axial image 25 of series 12 measuring 1.5 cm in diameter, which
does not appear to enhance on post gadolinium imaging (allowing for
some motion related artifact which compromises subtraction imaging),
presumably an area of internal hemorrhage. Some peripheral regions
of non mass-like areas of arterial phase hyperenhancement are noted
adjacent to the embolization site, favored to reflect benign
perfusion anomalies. A small T1 hypointense, T2 hyperintense lesion
in the dome of the liver in segment 7 (axial image 21 of series 15)
measures 1.6 x 1.2 cm on today's examination (previously 8 mm on CT
[DATE]), and demonstrates diffusion restriction with some
washout on delayed images such that the lesion is indistinguishable
from surrounding hepatic parenchyma, but no definite pseudo capsule.
No intra or extrahepatic biliary ductal dilatation. Gallbladder is
unremarkable in appearance.

Pancreas: No pancreatic mass. No pancreatic ductal dilatation. No
pancreatic or peripancreatic fluid collections or inflammatory
changes.

Spleen: Spleen is mildly enlarged measuring 11.9 x 5.6 X 9.7 cm
(estimated splenic volume of 323 mL) .

Adrenals/Urinary Tract: Subcentimeter T1 hypointense, T2
hyperintense, nonenhancing lesion in the lower pole of the left
kidney, compatible with a small simple cyst. Right kidney and
bilateral adrenal glands are normal in appearance. No
hydroureteronephrosis in the visualized portions of the abdomen.

Stomach/Bowel: Visualized portions are unremarkable.

Vascular/Lymphatic: No aneurysm identified in the visualized
abdominal vasculature. No lymphadenopathy noted in the abdomen.

Other:  Trace amount of perihepatic ascites.

Musculoskeletal: No aggressive appearing osseous lesions are noted
in the visualized portions of the skeleton.
IMPRESSION: 1. Previously embolized lesion demonstrates no substantial internal
enhancement, once again categorized as LR-TR nonviable.
2. Enlarging 1.6 x 1.2 cm arterial phase hyperenhancing lesion in
segment 7 (previously only 8 mm), previously characterized as LR 3,
now considered suspicious for HCC (LR-4) based on non-peripheral
washout and interval growth.
3. Mild splenomegaly.

These results will be called to the ordering clinician or
representative by the Radiologist Assistant, and communication
documented in the PACS or [REDACTED].

## 2022-03-30 MED ORDER — GADOBUTROL 1 MMOL/ML IV SOLN
6.0000 mL | Freq: Once | INTRAVENOUS | Status: AC | PRN
  Administered 2022-03-30: 7.5 mL via INTRAVENOUS

## 2022-03-31 LAB — AFP TUMOR MARKER: AFP, Serum, Tumor Marker: 4.4 ng/mL (ref 0.0–8.4)

## 2022-04-01 ENCOUNTER — Other Ambulatory Visit: Payer: Self-pay

## 2022-04-04 ENCOUNTER — Inpatient Hospital Stay (HOSPITAL_BASED_OUTPATIENT_CLINIC_OR_DEPARTMENT_OTHER): Payer: 59 | Admitting: Hematology

## 2022-04-04 ENCOUNTER — Other Ambulatory Visit: Payer: Self-pay

## 2022-04-04 ENCOUNTER — Inpatient Hospital Stay: Payer: 59

## 2022-04-04 ENCOUNTER — Encounter: Payer: Self-pay | Admitting: Hematology

## 2022-04-04 VITALS — BP 155/86 | HR 80 | Temp 98.2°F | Resp 18 | Ht 65.0 in | Wt 132.5 lb

## 2022-04-04 DIAGNOSIS — E119 Type 2 diabetes mellitus without complications: Secondary | ICD-10-CM | POA: Diagnosis not present

## 2022-04-04 DIAGNOSIS — C220 Liver cell carcinoma: Secondary | ICD-10-CM

## 2022-04-04 DIAGNOSIS — Z794 Long term (current) use of insulin: Secondary | ICD-10-CM | POA: Diagnosis not present

## 2022-04-04 DIAGNOSIS — Z79899 Other long term (current) drug therapy: Secondary | ICD-10-CM | POA: Diagnosis not present

## 2022-04-04 NOTE — Progress Notes (Signed)
[place]   [phone] [phone]   Clinic Follow up Note   Patient Care Team: Pa, [facility] as PCP - General (Family Medicine) [name], MD as Consulting Physician (Hematology and Oncology) [name], RN (Inactive) as Oncology Nurse Navigator  Date of Service:  04/04/2022  CHIEF COMPLAINT: f/u of multifocal [place]  CURRENT THERAPY:  Surveillance -s/p Y90 on 07/01/21 and 07/22/21  ASSESSMENT & PLAN:  Shawn Bailey is a 76 y.o. male with   1.  Multifocal hepatocellular carcinoma, cT3N0M, stage IIIA   -Liver MRI 03/06/21 showed a large 9.5 cm and a 1.3 cm liver mass is seen in right lobe, biopsy 03/08/21 confirmed moderately differentiated hepatocellular carcinoma.  No nodal or distant metastasis on CT 03/05/21. -This is likely related to his HCV infection -He is not a candidate for liver resection or transplant. Dr. [name] at [facility] did not recommend surgery given the location and extent of disease. -He started atezolizumab and bevacizumab on 04/28/21, while waiting for liver radioembolization. He tolerates this well with mild fatigue. He overall feels better after started treatment  -His AFP has dropped significantly which likely indicating good response  -he underwent Y90 on 07/22/21 and on 08/31/21. -restaging CT AP on 01/28/22 showed: stable primary right hepatic mass; two new small hepatic lesions, 8 mm and 5 mm which are indeterminate (LR-3).  -liver MRI 03/30/22 showed stable treated liver lesion and enlarging 1.6 cm segment 7 lesion (previously 8 mm). I reviewed the results with them today. -labs from 03/30/22 reviewed, overall WNL, improved from prior. AFP has come down to normal.   -I have reached out to IR Dr. Laurence Ferrari to see if he recommends liver ablation for the enlarging small satellite lesion. -I plan to hold on systemic treatment at this point.   2. 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   3. Untreated HCV  -During 02/2021 Hospitalization, labs showed HIV non reactive and hepatitis panel hep C ab positive, he was not aware that he has HCV infection -due to his recent [facility] diagnosis, will hold off HCV treatment for now -he does not see any doctors regularly. I previously referred him to the liver clinic for evaluation of his cancer, HCV, and for endoscopy when needed. This has not yet been scheduled.     PLAN: -Lab and scan reviewed --I have reached out to IR Dr. Laurence Ferrari to see if he recommends liver ablation for the enlarging small satellite lesion -f/u in 1-2 months, depends on if he will have liver ablation    No problem-specific Assessment & Plan notes found for this encounter.   SUMMARY OF ONCOLOGIC HISTORY: Oncology History Overview Note  Cancer Staging Hepatocellular carcinoma [facility]) Staging form: Liver, AJCC 8th Edition - Clinical stage from 03/08/2021: Stage IIIA (cT3, cN0, cM0) - Signed by [name], MD on 03/11/2021 Stage prefix: Initial diagnosis Histologic grade (G): G2 Histologic grading system: 4 grade system     Hepatocellular carcinoma ([place])   Initial Diagnosis   Liver mass, right lobe   03/05/2021 Imaging   CT CAP  IMPRESSION: 1. Right hepatic lobe masses which are suspicious for either metastatic disease or primary malignancy such as hepatocellular carcinoma. These are suboptimally evaluated on this noncontrast exam. Consider multidisciplinary oncology consultation with potential clinical strategies of tissue sampling versus PET to direct sampling. 2. No primary malignancy identified within the chest, abdomen, or  pelvis, given limitation of noncontrast exam. 3. Left nephrolithiasis. 4. Coronary artery atherosclerosis. Aortic Atherosclerosis (ICD10-I70.0). 5. Esophageal air fluid level suggests dysmotility or gastroesophageal reflux.    03/06/2021 Imaging   MRI Liver  IMPRESSION: 9.5 cm heterogeneous hypovascular mass in anterior right hepatic lobe, and 1.3 cm hypervascular mass in the posterior right hepatic lobe. Differential diagnosis includes liver metastases and multifocal hepatocellular carcinoma. Suggest correlation with serum AFP level, and possible tissue sampling.   No evidence of abdominal metastatic disease.   03/08/2021 Initial Biopsy   A. LIVER, RIGHT, BIOPSY:  - Hepatocellular carcinoma.   COMMENT:  The hepatocellular carcinoma is moderately differentiated and associated with areas of necrosis.  Immunohistochemistry shows positivity with glypican-3 and weak staining with HepPar 1.  The tumor is negative with arginase 1, alpha-fetoprotein, CDX2, cytokeratin 7, cytokeratin 20, cytokeratin AE1/AE3, PSA, prostein and MOC31.  Polyclonal CEA shows focal canalicular pattern.  The morphology and immunophenotype are consistent with hepatocellular carcinoma.    03/08/2021 Cancer Staging   Staging form: Liver, AJCC 8th Edition - Clinical stage from 03/08/2021: Stage IIIA (cT3, cN0, cM0) - Signed by [name], MD on 03/11/2021 Stage prefix: Initial diagnosis Histologic grade (G): G2 Histologic grading system: 4 grade system   04/28/2021 -  Chemotherapy   Patient is on Treatment Plan : LUNG Atezolizumab + Bevacizumab q21d Maintenance     06/16/2021 Imaging   CT Abdomen  IMPRESSION: Marked interval decrease in size of the large RIGHT hepatic lobe lesion, biopsy-proven hepatocellular carcinoma. No arterial phase enhancement on early phase, vague enhancement on later phase greater than 20 Hounsfield unit enhancement on venous phase. Less pronounced arterial enhancement than on previous imaging with small peripheral foci of enhancement on arterial phase particularly along the margin of the tumor near the dome of the liver (image 12/2) 11 mm focus of enhancement in this area.   Interval response to therapy of a small  lesion previously compatible with LI-RADS category 5 lesion in the posterior RIGHT hepatic lobe.   No new hepatic lesion.   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   Signs of nephrolithiasis calculus on the LEFT measuring approximately 7 mm.   Colonic diverticulosis without evidence of acute diverticulitis.   Aortic Atherosclerosis (ICD10-I70.0).   11/02/2021 Imaging   EXAM: CT ABDOMEN AND PELVIS WITH CONTRAST   IMPRESSION: 1. Right hepatic mass has minimally decreased in size. No new focal hepatic masses are seen. 2. New small amount of ascites in the right upper quadrant. 3. Wall thickening of the cecum and ascending colon versus normal under distension. Correlate for nonspecific colitis. 4. Nonobstructing left renal calculus. 5.  Aortic Atherosclerosis (ICD10-I70.0).      INTERVAL HISTORY:  Jefferson Fullam is here for a follow up of [place]. He was last seen by me on 01/31/22. He presents to the clinic accompanied by his ex-wife and her husband.    All other systems were reviewed with the patient and are negative.  MEDICAL HISTORY:  Past Medical History:  Diagnosis Date   Depression    Hepatocellular carcinoma ([place]) 03/08/2021   Parasite infestation     SURGICAL HISTORY: Past Surgical History:  Procedure Laterality Date   IR ANGIOGRAM SELECTIVE EACH ADDITIONAL VESSEL  07/01/2021   IR ANGIOGRAM SELECTIVE EACH ADDITIONAL VESSEL  07/22/2021   IR ANGIOGRAM SELECTIVE EACH ADDITIONAL VESSEL  07/22/2021   IR ANGIOGRAM SELECTIVE EACH ADDITIONAL VESSEL  07/22/2021   IR ANGIOGRAM SELECTIVE EACH ADDITIONAL VESSEL  07/22/2021   IR ANGIOGRAM  VISCERAL SELECTIVE  07/01/2021   IR ANGIOGRAM VISCERAL SELECTIVE  07/22/2021   IR EMBO ARTERIAL NOT HEMORR HEMANG INC GUIDE ROADMAPPING  07/01/2021   IR EMBO TUMOR ORGAN ISCHEMIA INFARCT INC GUIDE ROADMAPPING  07/22/2021   IR RADIOLOGIST EVAL & MGMT  06/01/2021   IR US GUIDE VASC ACCESS RIGHT  07/01/2021   IR US GUIDE VASC  ACCESS RIGHT  07/22/2021   NO PAST SURGERIES      I have reviewed the social history and family history with the patient and they are unchanged from previous note.  ALLERGIES:  has No Known Allergies.  MEDICATIONS:  Current Outpatient Medications  Medication Sig Dispense Refill   Ascorbic Acid (VITAMIN C) 100 MG tablet Take 100 mg by mouth daily.     blood glucose meter kit and supplies Dispense based on patient and insurance preference. Use up to four times daily as directed. (FOR ICD-10 E10.9, E11.9). 1 each 0   HYDROcodone-acetaminophen (NORCO) 5-325 MG tablet Take 1 tablet by mouth every 12 (twelve) hours as needed for moderate pain. 45 tablet 0   Insulin Pen Needle (PEN NEEDLES 3/16") 31G X 5 MM MISC 1 each by Does not apply route with breakfast, with lunch, and with evening meal. 100 each 3   metFORMIN (GLUCOPHAGE) 500 MG tablet Take 1 tablet (500 mg total) by mouth 2 (two) times daily with a meal. 60 tablet 3   Multiple Vitamin (MULTIVITAMIN WITH MINERALS) TABS tablet Take 1 tablet by mouth daily.     ondansetron (ZOFRAN) 8 MG tablet Take 1 tablet (8 mg total) by mouth every 8 (eight) hours as needed for nausea or vomiting. 30 tablet 0   polyethylene glycol (MIRALAX / GLYCOLAX) 17 g packet Take 17 g by mouth daily as needed for mild constipation. 14 each 0   prochlorperazine (COMPAZINE) 10 MG tablet Take 1 tablet (10 mg total) by mouth every 6 (six) hours as needed for nausea or vomiting. 30 tablet 0   senna (SENOKOT) 8.6 MG TABS tablet Take 1 tablet (8.6 mg total) by mouth 2 (two) times daily. (Patient taking differently: Take 1 tablet by mouth 2 (two) times daily as needed for moderate constipation.) 120 tablet 0   vitamin B-12 (CYANOCOBALAMIN) 100 MCG tablet Take 100 mcg by mouth daily.     No current facility-administered medications for this visit.   Facility-Administered Medications Ordered in Other Visits  Medication Dose Route Frequency Provider Last Rate Last Admin   0.9 %   sodium chloride infusion   Intravenous Once [name], MD       0.9 %  sodium chloride infusion   Intravenous Continuous [name], MD        PHYSICAL EXAMINATION: ECOG PERFORMANCE STATUS: 1 - Symptomatic but completely ambulatory  Vitals:   04/04/22 1252  BP: (!) 155/86  Pulse: 80  Resp: 18  Temp: 98.2 F (36.8 C)  SpO2: 100%   Wt Readings from Last 3 Encounters:  04/04/22 132 lb 8 oz (60.1 kg)  01/31/22 130 lb 11.2 oz (59.3 kg)  12/23/21 124 lb 4.8 oz (56.4 kg)     GENERAL:alert, no distress and comfortable SKIN: skin color, texture, turgor are normal, no rashes or significant lesions EYES: normal, Conjunctiva are pink and non-injected, sclera clear NECK: supple, thyroid normal size, non-tender, without nodularity LYMPH:  no palpable lymphadenopathy in the cervical, axillary  LUNGS: clear to auscultation and percussion with normal breathing effort HEART: regular rate & rhythm and no murmurs and no  lower extremity edema ABDOMEN:abdomen soft, non-tender and normal bowel sounds Musculoskeletal:no cyanosis of digits and no clubbing  NEURO: alert & oriented x 3 with fluent speech, no focal motor/sensory deficits  LABORATORY DATA:  I have reviewed the data as listed    Latest Ref Rng & Units 03/30/2022   11:27 AM 01/28/2022    3:03 PM 12/23/2021   12:41 PM  CBC  WBC 4.0 - 10.5 K/uL 4.3  3.3  2.8   Hemoglobin 13.0 - 17.0 g/dL 13.5  12.4  12.5   Hematocrit 39.0 - 52.0 % 38.6  37.4  35.4   Platelets 150 - 400 K/uL 116  94  102         Latest Ref Rng & Units 03/30/2022   11:27 AM 01/28/2022    3:03 PM 12/23/2021   12:41 PM  CMP  Glucose 70 - 99 mg/dL 65  383  386   BUN 8 - 23 mg/dL 14  14  17    Creatinine 0.61 - 1.24 mg/dL 0.83  0.84  0.68   Sodium 135 - 145 mmol/L 137  130  131   Potassium 3.5 - 5.1 mmol/L 4.1  4.0  4.5   Chloride 98 - 111 mmol/L 102  97  98   CO2 22 - 32 mmol/L 32  28  27   Calcium 8.9 - 10.3 mg/dL 10.1  9.7  9.7   Total Protein 6.5 - 8.1 g/dL 8.1  8.1   7.3   Total Bilirubin 0.3 - 1.2 mg/dL 0.5  0.7  0.7   Alkaline Phos 38 - 126 U/L 54  62  69   AST 15 - 41 U/L 42  37  52   ALT 0 - 44 U/L 34  34  44       RADIOGRAPHIC STUDIES: I have personally reviewed the radiological images as listed and agreed with the findings in the report. No results found.    No orders of the defined types were placed in this encounter.  All questions were answered. The patient knows to call the clinic with any problems, questions or concerns. No barriers to learning was detected. The total time spent in the appointment was 30 minutes.     [name], MD 04/04/2022   I, Wilburn Mylar, am acting as scribe for [name], MD.   I have reviewed the above documentation for accuracy and completeness, and I agree with the above.

## 2022-04-08 ENCOUNTER — Other Ambulatory Visit: Payer: Self-pay

## 2022-04-11 ENCOUNTER — Other Ambulatory Visit: Payer: Self-pay | Admitting: Interventional Radiology

## 2022-04-11 ENCOUNTER — Other Ambulatory Visit: Payer: Self-pay

## 2022-04-11 DIAGNOSIS — C220 Liver cell carcinoma: Secondary | ICD-10-CM

## 2022-04-12 ENCOUNTER — Telehealth: Payer: Self-pay | Admitting: Hematology

## 2022-04-26 ENCOUNTER — Ambulatory Visit
Admission: RE | Admit: 2022-04-26 | Discharge: 2022-04-26 | Disposition: A | Discharge status: Home / Self Care | Payer: 59 | Source: Ambulatory Visit | Attending: Interventional Radiology | Admitting: Interventional Radiology

## 2022-04-26 ENCOUNTER — Encounter: Payer: Self-pay | Admitting: *Deleted

## 2022-04-26 DIAGNOSIS — C220 Liver cell carcinoma: Secondary | ICD-10-CM

## 2022-04-26 HISTORY — PX: IR RADIOLOGIST EVAL & MGMT: IMG5224

## 2022-04-26 NOTE — Progress Notes (Signed)
Chief Complaint: Patient was seen in consultation today for HCV cirrhosis complicated by [facility] at the request of Amanada Philbrick K  Referring Physician(s): Feng,Yan  History of Present Illness: Shawn Bailey is a 76 y.o. 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  AT the time of initial diagnosis, his  dominant lesion measures 9.5 cm in the anterior right hepatic lobe with a smaller 1.3 cm satellite lesion more posteriorly in the right hepatic lobe.  AFP is significantly elevated at 2,353.  The mass was biopsied and found to be consistent with moderately differentiated hepatocellular carcinoma.  Clinical stage IIIA (cT3, cN0, cM0).  He is currently under the care of Dr. Burr Medico and undergoing combination immunotherapy with Tecentriq plus avastin Q 3 weeks.    He subsequently underwent segment 8 radiation segmentectomy TARE on 07/22/21.    CT 01/28/22:  1. Stable size of right hepatic mass, demonstrates mild perilesional enhancement with no internal enhancement (LRTR non-viable). 2. Arterially enhancing lesion of the right hepatic dome (segment VII) measuring 8 mm, no evidence of washout (LR-3). 3. Smaller arterially enhancing lesion is seen in the right hepatic lobe (segment VII) measuring 5 mm with no evidence of washout (LR-3).  MRI 03/30/22: 1. Previously embolized lesion demonstrates no substantial internal enhancement, once again categorized as LR-TR nonviable. 2. Enlarging 1.6 x 1.2 cm arterial phase hyperenhancing lesion in segment 7 (previously only 8 mm), previously characterized as LR 3, now considered suspicious for HCC (LR-4) based on non-peripheral washout and interval growth.  I evaluated Shawn Bailey with ultrasound today in the office.  I cannot convincingly identify the segment 7 lesion due to rib shadow and lung shadow.  I spoke with him about considering repeat Y90 segmentectomy.  He is very eager to proceed with additional  treatment.  He states that he had no issues with the last procedure and has been grateful for his response to therapy thus far.   He is in good spirits. He denies abdominal pain, nausea, vomiting or other systemic symptoms.   Past Medical History:  Diagnosis Date   Depression    Hepatocellular carcinoma ([place]) 03/08/2021   Parasite infestation     Past Surgical History:  Procedure Laterality Date   IR ANGIOGRAM SELECTIVE EACH ADDITIONAL VESSEL  07/01/2021   IR ANGIOGRAM SELECTIVE EACH ADDITIONAL VESSEL  07/22/2021   IR ANGIOGRAM SELECTIVE EACH ADDITIONAL VESSEL  07/22/2021   IR ANGIOGRAM SELECTIVE EACH ADDITIONAL VESSEL  07/22/2021   IR ANGIOGRAM SELECTIVE EACH ADDITIONAL VESSEL  07/22/2021   IR ANGIOGRAM VISCERAL SELECTIVE  07/01/2021   IR ANGIOGRAM VISCERAL SELECTIVE  07/22/2021   IR EMBO ARTERIAL NOT HEMORR HEMANG INC GUIDE ROADMAPPING  07/01/2021   IR EMBO TUMOR ORGAN ISCHEMIA INFARCT INC GUIDE ROADMAPPING  07/22/2021   IR RADIOLOGIST EVAL & MGMT  06/01/2021   IR RADIOLOGIST EVAL & MGMT  04/26/2022   IR US GUIDE VASC ACCESS RIGHT  07/01/2021   IR US GUIDE VASC ACCESS RIGHT  07/22/2021   NO PAST SURGERIES      Allergies: Patient has no known allergies.  Medications: Prior to Admission medications   Medication Sig Start Date End Date Taking? Authorizing Provider  Ascorbic Acid (VITAMIN C) 100 MG tablet Take 100 mg by mouth daily.    [provider]  blood glucose meter kit and supplies Dispense based on patient and insurance preference. Use up to four times daily as directed. (FOR ICD-10 E10.9, E11.9). 11/03/21   Dwyane Dee, MD  HYDROcodone-acetaminophen [facility])  5-325 MG tablet Take 1 tablet by mouth every 12 (twelve) hours as needed for moderate pain. 12/23/21   [name], MD  Insulin Pen Needle (PEN NEEDLES 3/16") 31G X 5 MM MISC 1 each by Does not apply route with breakfast, with lunch, and with evening meal. 11/03/21   Dwyane Dee, MD  metFORMIN (GLUCOPHAGE) 500 MG tablet  Take 1 tablet (500 mg total) by mouth 2 (two) times daily with a meal. 11/05/21   Dwyane Dee, MD  Multiple Vitamin (MULTIVITAMIN WITH MINERALS) TABS tablet Take 1 tablet by mouth daily.    [provider]  ondansetron (ZOFRAN) 8 MG tablet Take 1 tablet (8 mg total) by mouth every 8 (eight) hours as needed for nausea or vomiting. 04/28/21   Dede Query T, PA-C  polyethylene glycol (MIRALAX / GLYCOLAX) 17 g packet Take 17 g by mouth daily as needed for mild constipation. 03/08/21   Georgette Shell, MD  prochlorperazine (COMPAZINE) 10 MG tablet Take 1 tablet (10 mg total) by mouth every 6 (six) hours as needed for nausea or vomiting. 04/28/21   Lincoln Brigham, PA-C  senna (SENOKOT) 8.6 MG TABS tablet Take 1 tablet (8.6 mg total) by mouth 2 (two) times daily. Patient taking differently: Take 1 tablet by mouth 2 (two) times daily as needed for moderate constipation. 03/08/21   Georgette Shell, MD  vitamin B-12 (CYANOCOBALAMIN) 100 MCG tablet Take 100 mcg by mouth daily.    [provider]     Family History  Problem Relation Age of Onset   Diabetes Mother     Social History   Socioeconomic History   Marital status: Divorced    Spouse name: Not on file   Number of children: 2   Years of education: Not on file   Highest education level: Not on file  Occupational History   Not on file  Tobacco Use   Smoking status: Never   Smokeless tobacco: Never  Vaping Use   Vaping Use: Never used  Substance and Sexual Activity   Alcohol use: Yes    Comment: occasional beer, he used to drink alcohol regularly 15 years    Drug use: Not Currently   Sexual activity: Not on file  Other Topics Concern   Not on file  Social History Narrative   Not on file   Social Determinants of Health   Financial Resource Strain: Not on file  Food Insecurity: Not on file  Transportation Needs: Not on file  Physical Activity: Not on file  Stress: Not on file  Social Connections: Not  on file    ECOG Status: 0 - Asymptomatic  Review of Systems: A 12 point ROS discussed and pertinent positives are indicated in the HPI above.  All other systems are negative.  Review of Systems  Vital Signs: There were no vitals taken for this visit.  Advance Care Plan: The advanced care plan/surrogate decision maker was discussed at the time of visit and the patient did not wish to discuss or was not able to name a surrogate decision maker or provide an advance care plan.    Physical Exam Constitutional:      General: He is not in acute distress.    Appearance: Normal appearance.  HENT:     Head: Normocephalic and atraumatic.  Eyes:     General: No scleral icterus. Cardiovascular:     Rate and Rhythm: Normal rate.  Pulmonary:     Effort: Pulmonary effort is normal.  Abdominal:     General: Abdomen is flat. There is no distension.     Palpations: Abdomen is soft.     Tenderness: There is no abdominal tenderness.  Skin:    General: Skin is warm and dry.  Neurological:     Mental Status: He is alert and oriented to person, place, and time.  Psychiatric:        Mood and Affect: Mood normal.        Behavior: Behavior normal.       Imaging: IR Radiologist Eval & Mgmt  Result Date: 04/26/2022 Please refer to notes tab for details about interventional procedure. 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 Liver lesion noted on prior examinations. Follow-up study. EXAM: MRI ABDOMEN WITHOUT AND WITH CONTRAST TECHNIQUE: Multiplanar multisequence MR imaging of the abdomen was performed both before and after the administration of intravenous contrast. CONTRAST:  7.63m GADAVIST GADOBUTROL 1 MMOL/ML IV SOLN COMPARISON:  Abdominal MRI 03/06/2021. CT the abdomen and pelvis 01/28/2022. FINDINGS: Lower chest: Unremarkable. Hepatobiliary: Liver has a slightly nodular contour, indicative of  underlying cirrhosis. Again noted is a peripheral hypovascular area in the right lobe of the liver involving predominantly segments 8 and 5, site of prior embolization procedure. Within this region there is a T1 hyperintense, T2 hypointense nodule best appreciated on axial image 25 of series 12 measuring 1.5 cm in diameter, which does not appear to enhance on post gadolinium imaging (allowing for some motion related artifact which compromises subtraction imaging), presumably an area of internal hemorrhage. Some peripheral regions of non mass-like areas of arterial phase hyperenhancement are noted adjacent to the embolization site, favored to reflect benign perfusion anomalies. A small T1 hypointense, T2 hyperintense lesion in the dome of the liver in segment 7 (axial image 21 of series 15) measures 1.6 x 1.2 cm on today's examination (previously 8 mm on CT 01/28/2022), and demonstrates diffusion restriction with some washout on delayed images such that the lesion is indistinguishable from surrounding hepatic parenchyma, but no definite pseudo capsule. No intra or extrahepatic biliary ductal dilatation. Gallbladder is unremarkable in appearance. Pancreas: No pancreatic mass. No pancreatic ductal dilatation. No pancreatic or peripancreatic fluid collections or inflammatory changes. Spleen: Spleen is mildly enlarged measuring 11.9 x 5.6 X 9.7 cm (estimated splenic volume of 323 mL) . Adrenals/Urinary Tract: Subcentimeter T1 hypointense, T2 hyperintense, nonenhancing lesion in the lower pole of the left kidney, compatible with a small simple cyst. Right kidney and bilateral adrenal glands are normal in appearance. No hydroureteronephrosis in the visualized portions of the abdomen. Stomach/Bowel: Visualized portions are unremarkable. Vascular/Lymphatic: No aneurysm identified in the visualized abdominal vasculature. No lymphadenopathy noted in the abdomen. Other:  Trace amount of perihepatic ascites. Musculoskeletal: No  aggressive appearing osseous lesions are noted in the visualized portions of the skeleton. IMPRESSION: 1. Previously embolized lesion demonstrates no substantial internal enhancement, once again categorized as LR-TR nonviable. 2. Enlarging 1.6 x 1.2 cm arterial phase hyperenhancing lesion in segment 7 (previously only 8 mm), previously characterized as LR 3, now considered suspicious for HCC (LR-4) based on non-peripheral washout and interval growth. 3. Mild splenomegaly. These results will be called to the ordering clinician or representative by the Radiologist Assistant, and communication documented in the PACS or [employer] Electronically Signed   By: DVinnie LangtonM.D.   On: 04/01/2022 09:17    Labs:  CBC: Recent Labs    11/24/21  1029 12/23/21 1241 01/28/22 1503 03/30/22 1127  WBC 3.0* 2.8* 3.3* 4.3  HGB 13.1 12.5* 12.4* 13.5  HCT 37.7* 35.4* 37.4* 38.6*  PLT 74* 102* 94* 116*    COAGS: Recent Labs    07/01/21 0755 07/22/21 0835  INR 1.0 1.1    BMP: Recent Labs    11/24/21 1029 12/23/21 1241 01/28/22 1503 03/30/22 1127  NA 131* 131* 130* 137  K 4.1 4.5 4.0 4.1  CL 100 98 97* 102  CO2 _0 32  GLUCOSE 353* 386* 383* 65*  BUN _1 CALCIUM 9.4 9.7 9.7 10.1  CREATININE 0.72 0.68 0.84 0.83  GFRNONAA >60 >60 >60 >60    LIVER FUNCTION TESTS: Recent Labs    11/24/21 1029 12/23/21 1241 01/28/22 1503 03/30/22 1127  BILITOT 1.1 0.7 0.7 0.5  AST 82* 52* 37 42*  ALT 62* 44 34 34  ALKPHOS 55 69 62 54  PROT 7.3 7.3 8.1 8.1  ALBUMIN 3.5 3.6 4.0 4.0    TUMOR MARKERS: No results for input(s): "AFPTM", "CEA", "CA199", "CHROMGRNA" in the last 8760 hours.  Assessment and Plan:  76 year old gentleman with HCV related cirrhosis complicated by multifocal hepatocellular carcinoma.  His prior segment 8 radiation segmentectomy continues to demonstrate no evidence of residual or recurrent disease.  However, he has a small new lesion in hepatic segment 7  measuring 1.6 x 1.2 cm which has enlarged over serial imaging concerning for a new focus of carcinoma.  I was unable to confidently identify this lesion via ultrasound on examination today.  Therefore, it is not an ideal candidate for percutaneous microwave ablation.  I believe he would be a candidate for a repeat radiation segmentectomy given his overall good hepatic reserve and the small size of this lesion.  1.)  Please schedule for pre-Y 90 planning arteriogram to be followed by segment 7 radiation segmentectomy at [facility].    Electronically Signed: Criselda Peaches 04/26/2022, 10:37 AM   I spent a total of  15 Minutes in face to face in clinical consultation, greater than 50% of which was counseling/coordinating care for hepatocellular cancer.

## 2022-05-02 ENCOUNTER — Other Ambulatory Visit (HOSPITAL_COMMUNITY): Payer: Self-pay | Admitting: Interventional Radiology

## 2022-05-02 DIAGNOSIS — C220 Liver cell carcinoma: Secondary | ICD-10-CM

## 2022-05-09 ENCOUNTER — Other Ambulatory Visit: Payer: Self-pay

## 2022-05-24 ENCOUNTER — Ambulatory Visit (HOSPITAL_COMMUNITY): Payer: 59

## 2022-05-24 ENCOUNTER — Other Ambulatory Visit: Payer: Self-pay | Admitting: Student

## 2022-05-24 DIAGNOSIS — C220 Liver cell carcinoma: Secondary | ICD-10-CM

## 2022-05-25 ENCOUNTER — Other Ambulatory Visit: Payer: Self-pay

## 2022-05-25 ENCOUNTER — Other Ambulatory Visit (HOSPITAL_COMMUNITY): Payer: Self-pay | Admitting: Interventional Radiology

## 2022-05-25 ENCOUNTER — Ambulatory Visit (HOSPITAL_COMMUNITY)
Admission: RE | Admit: 2022-05-25 | Discharge: 2022-05-25 | Disposition: A | Discharge status: Home / Self Care | Payer: 59 | Source: Ambulatory Visit | Attending: Interventional Radiology | Admitting: Interventional Radiology

## 2022-05-25 ENCOUNTER — Encounter (HOSPITAL_COMMUNITY)
Admission: RE | Admit: 2022-05-25 | Discharge: 2022-05-25 | Disposition: A | Discharge status: Home / Self Care | Payer: 59 | Source: Ambulatory Visit | Attending: Interventional Radiology | Admitting: Interventional Radiology

## 2022-05-25 ENCOUNTER — Encounter (HOSPITAL_COMMUNITY): Payer: Self-pay

## 2022-05-25 DIAGNOSIS — C220 Liver cell carcinoma: Secondary | ICD-10-CM

## 2022-05-25 DIAGNOSIS — E162 Hypoglycemia, unspecified: Secondary | ICD-10-CM | POA: Insufficient documentation

## 2022-05-25 DIAGNOSIS — K7460 Unspecified cirrhosis of liver: Secondary | ICD-10-CM | POA: Insufficient documentation

## 2022-05-25 HISTORY — PX: IR 3D INDEPENDENT WKST: IMG2385

## 2022-05-25 HISTORY — PX: IR ANGIOGRAM SELECTIVE EACH ADDITIONAL VESSEL: IMG667

## 2022-05-25 HISTORY — PX: IR US GUIDE VASC ACCESS RIGHT: IMG2390

## 2022-05-25 HISTORY — PX: IR EMBO ARTERIAL NOT HEMORR HEMANG INC GUIDE ROADMAPPING: IMG5448

## 2022-05-25 HISTORY — PX: IR ANGIOGRAM VISCERAL SELECTIVE: IMG657

## 2022-05-25 LAB — COMPREHENSIVE METABOLIC PANEL
ALT: 33 U/L (ref 0–44)
AST: 38 U/L (ref 15–41)
Albumin: 3.8 g/dL (ref 3.5–5.0)
Alkaline Phosphatase: 49 U/L (ref 38–126)
Anion gap: 7 (ref 5–15)
BUN: 19 mg/dL (ref 8–23)
CO2: 26 mmol/L (ref 22–32)
Calcium: 9.4 mg/dL (ref 8.9–10.3)
Chloride: 106 mmol/L (ref 98–111)
Creatinine, Ser: 0.79 mg/dL (ref 0.61–1.24)
GFR, Estimated: >60 mL/min (ref >60)
Glucose, Bld: 49 mg/dL — ABNORMAL LOW (ref 70–99)
Potassium: 3.3 mmol/L — ABNORMAL LOW (ref 3.5–5.1)
Sodium: 139 mmol/L (ref 135–145)
Total Bilirubin: 0.6 mg/dL (ref 0.3–1.2)
Total Protein: 7.5 g/dL (ref 6.5–8.1)

## 2022-05-25 LAB — CBC WITH DIFFERENTIAL/PLATELET
Abs Immature Granulocytes: 0.01 10*3/uL (ref 0.00–0.07)
Basophils Absolute: 0 10*3/uL (ref 0.0–0.1)
Basophils Relative: 0 %
Eosinophils Absolute: 0.1 10*3/uL (ref 0.0–0.5)
Eosinophils Relative: 2 %
HCT: 38.5 % — ABNORMAL LOW (ref 39.0–52.0)
Hemoglobin: 13 g/dL (ref 13.0–17.0)
Immature Granulocytes: 0 %
Lymphocytes Relative: 28 %
Lymphs Abs: 1.3 10*3/uL (ref 0.7–4.0)
MCH: 30.2 pg (ref 26.0–34.0)
MCHC: 33.8 g/dL (ref 30.0–36.0)
MCV: 89.5 fL (ref 80.0–100.0)
Monocytes Absolute: 0.6 10*3/uL (ref 0.1–1.0)
Monocytes Relative: 13 %
Neutro Abs: 2.5 10*3/uL (ref 1.7–7.7)
Neutrophils Relative %: 57 %
Platelets: 115 10*3/uL — ABNORMAL LOW (ref 150–400)
RBC: 4.3 MIL/uL (ref 4.22–5.81)
RDW: 12.9 % (ref 11.5–15.5)
WBC: 4.5 10*3/uL (ref 4.0–10.5)
nRBC: 0 % (ref 0.0–0.2)

## 2022-05-25 LAB — GLUCOSE, CAPILLARY
Glucose-Capillary: 180 mg/dL — ABNORMAL HIGH (ref 70–99)
Glucose-Capillary: 44 mg/dL — CL (ref 70–99)
Glucose-Capillary: 160 mg/dL — ABNORMAL HIGH (ref 70–99)
Glucose-Capillary: 99 mg/dL (ref 70–99)

## 2022-05-25 MED ORDER — IOHEXOL 300 MG/ML  SOLN
100.0000 mL | Freq: Once | INTRAMUSCULAR | Status: AC | PRN
  Administered 2022-05-25: 51 mL via INTRA_ARTERIAL

## 2022-05-25 MED ORDER — MIDAZOLAM HCL 2 MG/2ML IJ SOLN
INTRAMUSCULAR | Status: AC | PRN
  Administered 2022-05-25 (×2): 1 mg via INTRAVENOUS

## 2022-05-25 MED ORDER — DEXTROSE 50 % IV SOLN
INTRAVENOUS | Status: AC
  Administered 2022-05-25: 50 mL via INTRAVENOUS
  Filled 2022-05-25: qty 50

## 2022-05-25 MED ORDER — DEXTROSE 50 % IV SOLN: 50.0000 mL | Freq: Once | INTRAVENOUS | Status: AC

## 2022-05-25 MED ORDER — IOHEXOL 300 MG/ML  SOLN
100.0000 mL | Freq: Once | INTRAMUSCULAR | Status: AC | PRN
Start: 2022-05-25 — End: 2022-05-25
  Administered 2022-05-25: 52 mL via INTRA_ARTERIAL

## 2022-05-25 MED ORDER — FENTANYL CITRATE (PF) 100 MCG/2ML IJ SOLN
INTRAMUSCULAR | Status: AC | PRN
  Administered 2022-05-25 (×2): 50 ug via INTRAVENOUS

## 2022-05-25 MED ORDER — FENTANYL CITRATE (PF) 100 MCG/2ML IJ SOLN
INTRAMUSCULAR | Status: AC
  Filled 2022-05-25: qty 2

## 2022-05-25 MED ORDER — LIDOCAINE HCL 1 % IJ SOLN
INTRAMUSCULAR | Status: AC
  Filled 2022-05-25: qty 20

## 2022-05-25 MED ORDER — SODIUM CHLORIDE 0.9 % IV SOLN: INTRAVENOUS | Status: DC

## 2022-05-25 MED ORDER — LIDOCAINE HCL (PF) 1 % IJ SOLN
INTRAMUSCULAR | Status: AC | PRN
  Administered 2022-05-25: 5 mL

## 2022-05-25 MED ORDER — MIDAZOLAM HCL 2 MG/2ML IJ SOLN
INTRAMUSCULAR | Status: AC
  Filled 2022-05-25: qty 4

## 2022-05-25 NOTE — Progress Notes (Signed)
0834 CBG 160 Alert and talkative, skin warm and dry. He was never clammy and sweaty with the low blood sugar of 44.

## 2022-05-25 NOTE — Discharge Instructions (Addendum)
Discharge Instructions:   Please call Interventional Radiology clinic [phone] with any questions or concerns.  You may remove your dressing and shower tomorrow.  Has a celt closure device.    Moderate Conscious Sedation, Adult, Care After This sheet gives you information about how to care for yourself after your procedure. Your health care provider may also give you more specific instructions. If you have problems or questions, contact your health care provider. What can I expect after the procedure? After the procedure, it is common to have: Sleepiness for several hours. Impaired judgment for several hours. Difficulty with balance. Vomiting if you eat too soon. Follow these instructions at home: For the time period you were told by your health care provider:     Rest. Do not participate in activities where you could fall or become injured. Do not drive or use machinery. Do not drink alcohol. Do not take sleeping pills or medicines that cause drowsiness. Do not make important decisions or sign legal documents. Do not take care of children on your own. Eating and drinking  Follow the diet recommended by your health care provider. Drink enough fluid to keep your urine pale yellow. If you vomit: Drink water, juice, or soup when you can drink without vomiting. Make sure you have little or no nausea before eating solid foods. General instructions Take over-the-counter and prescription medicines only as told by your health care provider. Have a responsible adult stay with you for the time you are told. It is important to have someone help care for you until you are awake and alert. Do not smoke. Keep all follow-up visits as told by your health care provider. This is important. Contact a health care provider if: You are still sleepy or having trouble with balance after 24 hours. You feel light-headed. You keep feeling nauseous or you keep vomiting. You develop a rash. You  have a fever. You have redness or swelling around the IV site. Get help right away if: You have trouble breathing. You have new-onset confusion at home. Summary After the procedure, it is common to feel sleepy, have impaired judgment, or feel nauseous if you eat too soon. Rest after you get home. Know the things you should not do after the procedure. Follow the diet recommended by your health care provider and drink enough fluid to keep your urine pale yellow. Get help right away if you have trouble breathing or new-onset confusion at home. This information is not intended to replace advice given to you by your health care provider. Make sure you discuss any questions you have with your health care provider. Document Revised: 01/31/2020 Document Reviewed: 08/29/2019 Elsevier Patient Education  [place].

## 2022-05-25 NOTE — Procedures (Signed)
Interventional Radiology Procedure Note  Procedure: Pre-Y90 hepatic angiogram  Complications: None  Estimated Blood Loss: None  Recommendations: - Successful mapping study - Patient will return to for segment 7 segmentectomy   Signed,  Criselda Peaches, MD

## 2022-05-25 NOTE — Progress Notes (Signed)
0736 CBG 44 0753 Dextrose 50% given per protocol will notify PA

## 2022-05-25 NOTE — Progress Notes (Signed)
0820 Notified Rowe Robert, PA re. 44 CBG on arrival at 0736.  0753 given Dextrose 50% given CBG 180 at 0813.

## 2022-05-25 NOTE — Progress Notes (Signed)
0813 CBG re-check 180 alert and talkative

## 2022-05-25 NOTE — H&P (Signed)
Chief Complaint: Patient was seen in consultation today for hepatocellular carcinoma  Referring Physician(s): Dr. [name]  Supervising Physician: Jacqulynn Cadet  Patient Status: [facility] - Out-pt  History of Present Illness: Shawn Bailey is a 76 y.o. 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  He is known to IR from  [place] 8 radiation segmentectomy TARE on 07/22/21 with good treatment response.  He was recently found to have a new lesion in segment 7 and has been referred back to IR for additional treatment.  He met with Dr. Laurence Ferrari in consultation 04/26/22 at which time the merits of additional angiogram and segmentectomy was discussed.  He elects to proceed and presents to [facility] today for the procedure.   [name] presents today in his usual state of health.  He has no new concerns or complaints.  He has been NPO.  He does not take blood thinners.  Shawn Bailey is available for transportation and care at home today.   Past Medical History:  Diagnosis Date   Depression    Hepatocellular carcinoma ([place]) 03/08/2021   Parasite infestation     Past Surgical History:  Procedure Laterality Date   IR ANGIOGRAM SELECTIVE EACH ADDITIONAL VESSEL  07/01/2021   IR ANGIOGRAM SELECTIVE EACH ADDITIONAL VESSEL  07/22/2021   IR ANGIOGRAM SELECTIVE EACH ADDITIONAL VESSEL  07/22/2021   IR ANGIOGRAM SELECTIVE EACH ADDITIONAL VESSEL  07/22/2021   IR ANGIOGRAM SELECTIVE EACH ADDITIONAL VESSEL  07/22/2021   IR ANGIOGRAM VISCERAL SELECTIVE  07/01/2021   IR ANGIOGRAM VISCERAL SELECTIVE  07/22/2021   IR EMBO ARTERIAL NOT HEMORR HEMANG INC GUIDE ROADMAPPING  07/01/2021   IR EMBO TUMOR ORGAN ISCHEMIA INFARCT INC GUIDE ROADMAPPING  07/22/2021   IR RADIOLOGIST EVAL & MGMT  06/01/2021   IR RADIOLOGIST EVAL & MGMT  04/26/2022   IR US GUIDE VASC ACCESS RIGHT  07/01/2021   IR US GUIDE VASC ACCESS RIGHT  07/22/2021   NO PAST SURGERIES       Allergies: Patient has no known allergies.  Medications: Prior to Admission medications   Medication Sig Start Date End Date Taking? Authorizing Provider  Ascorbic Acid (VITAMIN C) 100 MG tablet Take 100 mg by mouth daily.   Yes [provider]  blood glucose meter kit and supplies Dispense based on patient and insurance preference. Use up to four times daily as directed. (FOR ICD-10 E10.9, E11.9). 11/03/21  Yes Dwyane Dee, MD  HYDROcodone-acetaminophen (NORCO) 5-325 MG tablet Take 1 tablet by mouth every 12 (twelve) hours as needed for moderate pain. 12/23/21  Yes [name], MD  insulin glargine (LANTUS) 100 UNIT/ML injection 40 Units daily.   Yes [provider]  Insulin Pen Needle (PEN NEEDLES 3/16") 31G X 5 MM MISC 1 each by Does not apply route with breakfast, with lunch, and with evening meal. 11/03/21  Yes Dwyane Dee, MD  metFORMIN (GLUCOPHAGE) 500 MG tablet Take 1 tablet (500 mg total) by mouth 2 (two) times daily with a meal. 11/05/21  Yes Dwyane Dee, MD  Multiple Vitamin (MULTIVITAMIN WITH MINERALS) TABS tablet Take 1 tablet by mouth daily.   Yes [provider]  vitamin B-12 (CYANOCOBALAMIN) 100 MCG tablet Take 100 mcg by mouth daily.   Yes [provider]  ondansetron (ZOFRAN) 8 MG tablet Take 1 tablet (8 mg total) by mouth every 8 (eight) hours as needed for nausea or vomiting. 04/28/21   Shawn Query T, PA-C  polyethylene glycol (MIRALAX / Floria Raveling)  17 g packet Take 17 g by mouth daily as needed for mild constipation. 03/08/21   Shawn Shell, MD  prochlorperazine (COMPAZINE) 10 MG tablet Take 1 tablet (10 mg total) by mouth every 6 (six) hours as needed for nausea or vomiting. 04/28/21   Lincoln Brigham, PA-C  senna (SENOKOT) 8.6 MG TABS tablet Take 1 tablet (8.6 mg total) by mouth 2 (two) times daily. Patient taking differently: Take 1 tablet by mouth 2 (two) times daily as needed for moderate constipation. 03/08/21   Shawn Shell, MD     Family History  Problem Relation Age of Onset   Diabetes Mother     Social History   Socioeconomic History   Marital status: Divorced    Spouse name: Not on file   Number of children: 2   Years of education: Not on file   Highest education level: Not on file  Occupational History   Not on file  Tobacco Use   Smoking status: Never   Smokeless tobacco: Never  Vaping Use   Vaping Use: Never used  Substance and Sexual Activity   Alcohol use: Yes    Comment: occasional beer, he used to drink alcohol regularly 15 years    Drug use: Not Currently   Sexual activity: Not on file  Other Topics Concern   Not on file  Social History Narrative   Not on file   Social Determinants of Health   Financial Resource Strain: Not on file  Food Insecurity: Not on file  Transportation Needs: Not on file  Physical Activity: Not on file  Stress: Not on file  Social Connections: Not on file     Review of Systems: A 12 point ROS discussed and pertinent positives are indicated in the HPI above.  All other systems are negative.  Review of Systems  Constitutional:  Negative for fatigue and fever.  Respiratory:  Negative for cough and shortness of breath.   Cardiovascular:  Negative for chest pain.  Gastrointestinal:  Negative for abdominal pain, nausea and vomiting.  Genitourinary:  Negative for dysuria.  Musculoskeletal:  Negative for back pain.  Psychiatric/Behavioral:  Negative for behavioral problems and confusion.     Vital Signs: BP (!) 146/89   Pulse 72   Temp (!) 97.5 F (36.4 C) (Oral)   Resp 18   Ht _0  (1.651 m)   Wt 132 lb 15 oz (60.3 kg)   SpO2 100%   BMI 22.12 kg/m   Physical Exam Vitals and nursing note reviewed.  Constitutional:      General: He is not in acute distress.    Appearance: Normal appearance. He is not ill-appearing.  HENT:     Mouth/Throat:     Mouth: Mucous membranes are moist.     Pharynx: Oropharynx is clear.   Cardiovascular:     Rate and Rhythm: Normal rate and regular rhythm.     Pulses: Normal pulses.  Pulmonary:     Effort: Pulmonary effort is normal. No respiratory distress.     Breath sounds: Normal breath sounds.  Abdominal:     General: Abdomen is flat.     Palpations: Abdomen is soft.  Skin:    General: Skin is warm and dry.  Neurological:     General: No focal deficit present.     Mental Status: He is alert and oriented to person, place, and time. Mental status is at baseline.  Psychiatric:        Mood and  Affect: Mood normal.        Behavior: Behavior normal.        Thought Content: Thought content normal.        Judgment: Judgment normal.      MD Evaluation Airway: WNL Heart: WNL Abdomen: WNL Chest/ Lungs: WNL ASA  Classification: 3 Mallampati/Airway Score: Two   Imaging: US Abdomen Limited RUQ (LIVER/GB)  Result Date: 04/26/2022 CLINICAL DATA:  Multifocal hepatocellular carcinoma. Assess for visibility of new segment 7 lesion. EXAM: ULTRASOUND ABDOMEN LIMITED RIGHT UPPER QUADRANT COMPARISON:  MRI abdomen 03/30/2022 FINDINGS: Gallbladder: No gallstones or wall thickening visualized. No sonographic Murphy sign noted by sonographer. Common bile duct: Diameter: Normal at 3 mm Liver: Heterogeneous liver parenchyma with a nodular hepatic contour consistent with underlying cirrhosis. Probable 2.0 x 1.4 cm hypoechoic nodule in the right hepatic lobe in close proximity to the prior ablation site. It is unclear if this represents extension of the ablation site or the lesion of interest in segment 7. Prior ablation site along the anterior aspect of hepatic segment 8 again noted and measures 4.1 x 2.7 x 4.5 cm. No additional lesions identified. Portal vein is patent on color Doppler imaging with normal direction of blood flow towards the liver. Other: None. IMPRESSION: 1. Prior segment 8 radiation segmentectomy site visualized and measured at 4.1 x 2.7 x 4.5 cm. 2. The new lesion in  hepatic segment 7 was not confidently identified sonographically. Ultrasound guidance for percutaneous microwave ablation would be unreliable. Electronically Signed   By: Jacqulynn Cadet M.D.   On: 04/26/2022 15:04   IR Radiologist Eval & Mgmt  Result Date: 04/26/2022 Please refer to notes tab for details about interventional procedure. (Op Note)   Labs:  CBC: Recent Labs    12/23/21 1241 01/28/22 1503 03/30/22 1127 05/25/22 0750  WBC 2.8* 3.3* 4.3 4.5  HGB 12.5* 12.4* 13.5 13.0  HCT 35.4* 37.4* 38.6* 38.5*  PLT 102* 94* 116* 115*    COAGS: Recent Labs    07/01/21 0755 07/22/21 0835  INR 1.0 1.1    BMP: Recent Labs    12/23/21 1241 01/28/22 1503 03/30/22 1127 05/25/22 0750  NA 131* 130* 137 139  K 4.5 4.0 4.1 3.3*  CL 98 97* 102 106  CO2 27 28 32 26  GLUCOSE 386* 383* 65* 49*  BUN _0 CALCIUM 9.7 9.7 10.1 9.4  CREATININE 0.68 0.84 0.83 0.79  GFRNONAA >60 >60 >60 >60    LIVER FUNCTION TESTS: Recent Labs    12/23/21 1241 01/28/22 1503 03/30/22 1127 05/25/22 0750  BILITOT 0.7 0.7 0.5 0.6  AST 52* 37 42* 38  ALT 44 34 34 33  ALKPHOS 69 62 54 49  PROT 7.3 8.1 8.1 7.5  ALBUMIN 3.6 4.0 4.0 3.8    TUMOR MARKERS: No results for input(s): "AFPTM", "CEA", "CA199", "CHROMGRNA" in the last 8760 hours.  Assessment and Plan: Patient with past medical history of [place] s/p radioembolization with good response presents with complaint of newly identified liver lesion.  IR consulted for repeat Y90 at the request of Dr. Burr Medico. Case reviewed by Dr. Laurence Ferrari who approves patient for procedure and has met in consultation to discuss.  Patient presents today in their usual state of health.  He has been NPO and is not currently on blood thinners.   Patient is aware of plans to discharge home today and states that Shawn Bailey will be available for care and transportation.  He is also aware that there  is no treatment during today's procedure and he will return for  second appointment which has already been scheduled.   The Risks and benefits of embolization were discussed with the patient including, but not limited to bleeding, infection, vascular injury, post operative pain, or contrast induced renal failure.  This procedure involves the use of X-rays and because of the nature of the planned procedure, it is possible that we will have prolonged use of X-ray fluoroscopy.  Potential radiation risks to you include (but are not limited to) the following: - A slightly elevated risk for cancer several years later in life. This risk is typically less than 0.5% percent. This risk is low in comparison to the normal incidence of human cancer, which is 33% for women and 50% for men according to the [place]. - Radiation induced injury can include skin redness, resembling a rash, tissue breakdown / ulcers and hair loss (which can be temporary or permanent).   The likelihood of either of these occurring depends on the difficulty of the procedure and whether you are sensitive to radiation due to previous procedures, disease, or genetic conditions.   IF your procedure requires a prolonged use of radiation, you will be notified and given written instructions for further action.  It is your responsibility to monitor the irradiated area for the 2 weeks following the procedure and to notify your physician if you are concerned that you have suffered a radiation induced injury.    All of the patient's questions were answered, patient is agreeable to proceed. Consent signed and in chart.   Thank you for this interesting consult.  I greatly enjoyed meeting Darric Plante and look forward to participating in their care.  A copy of this report was sent to the requesting provider on this date.  Electronically Signed: Docia Barrier, PA 05/25/2022, 8:38 AM   I spent a total of    25 Minutes in face to face in clinical consultation, greater than  50% of which was counseling/coordinating care for hepatocellular carcinoma.

## 2022-05-30 ENCOUNTER — Other Ambulatory Visit (HOSPITAL_COMMUNITY): Payer: Self-pay | Admitting: Interventional Radiology

## 2022-05-30 ENCOUNTER — Encounter (HOSPITAL_COMMUNITY): Payer: Self-pay | Admitting: Radiology

## 2022-05-30 DIAGNOSIS — C220 Liver cell carcinoma: Secondary | ICD-10-CM

## 2022-06-06 ENCOUNTER — Ambulatory Visit (HOSPITAL_COMMUNITY): Payer: 59

## 2022-06-06 ENCOUNTER — Other Ambulatory Visit (HOSPITAL_COMMUNITY): Payer: 59

## 2022-06-14 ENCOUNTER — Ambulatory Visit (HOSPITAL_COMMUNITY): Payer: 59

## 2022-06-14 ENCOUNTER — Other Ambulatory Visit: Payer: Self-pay | Admitting: Physician Assistant

## 2022-06-14 ENCOUNTER — Other Ambulatory Visit (HOSPITAL_COMMUNITY): Payer: Self-pay | Admitting: Radiology

## 2022-06-14 ENCOUNTER — Other Ambulatory Visit (HOSPITAL_COMMUNITY): Payer: 59

## 2022-06-14 DIAGNOSIS — C220 Liver cell carcinoma: Secondary | ICD-10-CM

## 2022-06-14 NOTE — H&P (Signed)
Chief Complaint: Patient was seen in consultation today for hepatocellular carcinoma.  Referring Physician(s): [name], MD  Supervising Physician: Jacqulynn Cadet  Patient Status: [facility] - Out-pt  History of Present Illness: Shawn Bailey is a 76 y.o. male with a past medical history significant for hepatitis C cirrhosis and hepatocellular carcinoma s/p segment 8 radiation segmentectomy TARE 07/22/21 in IR who presents today for segment 7 transarterial radioembolization. Shawn Bailey was seen in follow with Dr. Laurence Ferrari on 04/26/22, please see this note for full details - briefly, Shawn Bailey was found to have a segment 7 liver lesion concerning for [facility] on MRI 03/30/22 and this was evaluated with [place] in IR clinic however it was not convincingly identified due to rib and lung shadow. The results of both imaging studies were discussed with the patient and decision was made to proceed with repeat Y90 transarterial radioembolization for which he presents today.  Past Medical History:  Diagnosis Date   Depression    Hepatocellular carcinoma ([place]) 03/08/2021   Parasite infestation     Past Surgical History:  Procedure Laterality Date   IR 3D INDEPENDENT WKST  05/25/2022   IR ANGIOGRAM SELECTIVE EACH ADDITIONAL VESSEL  07/01/2021   IR ANGIOGRAM SELECTIVE EACH ADDITIONAL VESSEL  07/22/2021   IR ANGIOGRAM SELECTIVE EACH ADDITIONAL VESSEL  07/22/2021   IR ANGIOGRAM SELECTIVE EACH ADDITIONAL VESSEL  07/22/2021   IR ANGIOGRAM SELECTIVE EACH ADDITIONAL VESSEL  07/22/2021   IR ANGIOGRAM SELECTIVE EACH ADDITIONAL VESSEL  05/25/2022   IR ANGIOGRAM SELECTIVE EACH ADDITIONAL VESSEL  05/25/2022   IR ANGIOGRAM SELECTIVE EACH ADDITIONAL VESSEL  05/25/2022   IR ANGIOGRAM SELECTIVE EACH ADDITIONAL VESSEL  05/25/2022   IR ANGIOGRAM SELECTIVE EACH ADDITIONAL VESSEL  05/25/2022   IR ANGIOGRAM SELECTIVE EACH ADDITIONAL VESSEL  05/25/2022   IR ANGIOGRAM VISCERAL SELECTIVE  07/01/2021   IR ANGIOGRAM VISCERAL  SELECTIVE  07/22/2021   IR ANGIOGRAM VISCERAL SELECTIVE  05/25/2022   IR EMBO ARTERIAL NOT HEMORR HEMANG INC GUIDE ROADMAPPING  07/01/2021   IR EMBO TUMOR ORGAN ISCHEMIA INFARCT INC GUIDE ROADMAPPING  07/22/2021   IR RADIOLOGIST EVAL & MGMT  06/01/2021   IR RADIOLOGIST EVAL & MGMT  04/26/2022   IR US GUIDE VASC ACCESS RIGHT  07/01/2021   IR US GUIDE VASC ACCESS RIGHT  07/22/2021   IR US GUIDE VASC ACCESS RIGHT  05/25/2022   NO PAST SURGERIES      Allergies: Patient has no known allergies.  Medications: Prior to Admission medications   Medication Sig Start Date End Date Taking? Authorizing Provider  Ascorbic Acid (VITAMIN C) 100 MG tablet Take 100 mg by mouth daily.    [provider]  blood glucose meter kit and supplies Dispense based on patient and insurance preference. Use up to four times daily as directed. (FOR ICD-10 E10.9, E11.9). 11/03/21   Dwyane Dee, MD  HYDROcodone-acetaminophen (NORCO) 5-325 MG tablet Take 1 tablet by mouth every 12 (twelve) hours as needed for moderate pain. 12/23/21   [name], MD  insulin glargine (LANTUS) 100 UNIT/ML injection 40 Units daily.    [provider]  Insulin Pen Needle (PEN NEEDLES 3/16") 31G X 5 MM MISC 1 each by Does not apply route with breakfast, with lunch, and with evening meal. 11/03/21   Dwyane Dee, MD  metFORMIN (GLUCOPHAGE) 500 MG tablet Take 1 tablet (500 mg total) by mouth 2 (two) times daily with a meal. 11/05/21   Dwyane Dee, MD  Multiple Vitamin (MULTIVITAMIN WITH MINERALS) TABS tablet  Take 1 tablet by mouth daily.    [provider]  ondansetron (ZOFRAN) 8 MG tablet Take 1 tablet (8 mg total) by mouth every 8 (eight) hours as needed for nausea or vomiting. 04/28/21   Dede Query T, PA-C  polyethylene glycol (MIRALAX / GLYCOLAX) 17 g packet Take 17 g by mouth daily as needed for mild constipation. 03/08/21   Georgette Shell, MD  prochlorperazine (COMPAZINE) 10 MG tablet Take 1 tablet (10 mg total) by  mouth every 6 (six) hours as needed for nausea or vomiting. 04/28/21   Lincoln Brigham, PA-C  senna (SENOKOT) 8.6 MG TABS tablet Take 1 tablet (8.6 mg total) by mouth 2 (two) times daily. Patient taking differently: Take 1 tablet by mouth 2 (two) times daily as needed for moderate constipation. 03/08/21   Georgette Shell, MD  vitamin B-12 (CYANOCOBALAMIN) 100 MCG tablet Take 100 mcg by mouth daily.    [provider]     Family History  Problem Relation Age of Onset   Diabetes Mother     Social History   Socioeconomic History   Marital status: Divorced    Spouse name: Not on file   Number of children: 2   Years of education: Not on file   Highest education level: Not on file  Occupational History   Not on file  Tobacco Use   Smoking status: Never   Smokeless tobacco: Never  Vaping Use   Vaping Use: Never used  Substance and Sexual Activity   Alcohol use: Yes    Comment: occasional beer, he used to drink alcohol regularly 15 years    Drug use: Not Currently   Sexual activity: Not on file  Other Topics Concern   Not on file  Social History Narrative   Not on file   Social Determinants of Health   Financial Resource Strain: Not on file  Food Insecurity: Not on file  Transportation Needs: Not on file  Physical Activity: Not on file  Stress: Not on file  Social Connections: Not on file     Review of Systems: A 12 point ROS discussed and pertinent positives are indicated in the HPI above.  All other systems are negative.  Review of Systems  Constitutional:  Negative for chills and fever.  Respiratory:  Negative for cough and shortness of breath.   Cardiovascular:  Negative for chest pain.  Gastrointestinal:  Negative for abdominal pain, diarrhea, nausea and vomiting.  Musculoskeletal:  Negative for back pain.  Neurological:  Negative for dizziness and headaches.    Vital Signs: There were no vitals taken for this visit.  Physical Exam Vitals  reviewed.  Constitutional:      General: He is not in acute distress.    Appearance: He is not ill-appearing.  HENT:     Head: Normocephalic.     Mouth/Throat:     Mouth: Mucous membranes are moist.     Pharynx: Oropharynx is clear. No oropharyngeal exudate or posterior oropharyngeal erythema.  Eyes:     General: No scleral icterus. Cardiovascular:     Rate and Rhythm: Normal rate and regular rhythm.  Pulmonary:     Effort: Pulmonary effort is normal.     Breath sounds: Normal breath sounds.  Abdominal:     General: There is no distension.     Palpations: Abdomen is soft.     Tenderness: There is no abdominal tenderness.  Skin:    General: Skin is warm and dry.  Coloration: Skin is not jaundiced.  Neurological:     Mental Status: He is alert and oriented to person, place, and time.  Psychiatric:        Mood and Affect: Mood normal.        Behavior: Behavior normal.        Thought Content: Thought content normal.        Judgment: Judgment normal.          Imaging: NM PRE Y90 LIVER SPECT LUNG SHUNT ASSESSMENT  Result Date: 05/26/2022 CLINICAL DATA:  76 year old male with hepatocellular carcinoma. He has recurrent disease with a new enlarging enhancing focus in hepatic segment 7. Prior segment 8 radiation segmentectomy. EXAM: NUCLEAR MEDICINE LIVER SCAN TECHNIQUE: Abdominal images were obtained in multiple projections after intrahepatic arterial injection of radiopharmaceutical. SPECT imaging was performed. Lung shunt calculation was performed. RADIOPHARMACEUTICALS:  4.2MCI Tc-63mMAA COMPARISON:  MRI abdomen 03/30/2022. FINDINGS: The injected microaggregated albumin localizes within the liver. No evidence of activity within the stomach, duodenum, or bowel. Calculated shunt fraction to the lungs equals 36.2%. IMPRESSION: 1. No significant extrahepatic radiotracer activity following intrahepatic arterial injection of MAA. 2. Lung shunt fraction equals 36.2% Electronically  Signed   By: TKerby MoorsM.D.   On: 05/26/2022 16:21   IR [place] Vasc Access Right  Result Date: 05/25/2022 INDICATION: 76year old male with hepatocellular carcinoma. He has recurrent disease with a new enlarging enhancing focus in hepatic segment 7. Prior segment 8 radiation segmentectomy. EXAM: ADDITIONAL ARTERIOGRAPHY; SELECTIVE VISCERAL ARTERIOGRAPHY; WORKSTATION 3D RECONSTRUCTION; IR ULTRASOUND GUIDANCE VASC ACCESS RIGHT; IR EMBO ARTERIAL NOT HEMORR HEMANG INC GUIDE ROADMAPPING MEDICATIONS: None. ANESTHESIA/SEDATION: Moderate (conscious) sedation was employed during this procedure. A total of Versed 2 mg and Fentanyl 100 mcg was administered intravenously. Moderate Sedation Time: 80 minutes. The patient's level of consciousness and vital signs were monitored continuously by radiology nursing throughout the procedure under my direct supervision. CONTRAST:  571mOMNIPAQUE IOHEXOL 300 MG/ML SOLN, 5261mMNIPAQUE IOHEXOL 300 MG/ML SOLN, 53m82mNIPAQUE IOHEXOL 300 MG/ML SOLN FLUOROSCOPY: Radiation Exposure Index (as provided by the fluoroscopic device): [id number] Kerma COMPLICATIONS: None immediate. PROCEDURE: Informed consent was obtained from the patient following explanation of the procedure, risks, benefits and alternatives. The patient understands, agrees and consents for the procedure. All questions were addressed. A time out was performed prior to the initiation of the procedure. Maximal barrier sterile technique utilized including caps, mask, sterile gowns, sterile gloves, large sterile drape, hand hygiene, and Betadine prep. The right common femoral artery was interrogated with ultrasound and found to be widely patent. An image was obtained and stored for the medical record. Local anesthesia was attained by infiltration with 1% lidocaine. A small dermatotomy was made. Under real-time sonographic guidance, the vessel was punctured with a 21 gauge micropuncture needle. Using standard technique, the  initial micro needle was exchanged over a 0.018 micro wire for a transitional 4 [place] sheath. The micro sheath was then exchanged over a 0.035 wire for a 5 French vascular sheath. A C2 cobra catheter was advanced into the celiac artery. A celiac arteriogram was performed. Conventional hepatic arterial anatomy again visualized. The C2 cobra catheter was then advanced into the common hepatic artery over a glidewire. Additional arteriography was performed. Hyperenhancing tumor blush identified in segment 7. A Cook cantata 2.8 French microcatheter was then carefully advanced over the wire and initially into the left hepatic artery. An arteriogram was performed. No evidence of hypervascular blush in the left hemi liver. The microcatheter  was then advanced into the right hepatic artery. Arteriography was performed. Hypervascular tumor blush again identified. The right hepatic artery sub divides into the segment 8 and segment 5/6/7 arteries. With great difficulty, the microcatheter was eventually advanced into the common trunk of the segment 5/6/7 artery. Arteriography was performed. The segment 7 artery arises from the superior aspect of the common trunk. Arteriography with cone beam CT and independent 3D workstation reformatting was performed. This confirms arterial supply to the enhancing lesion in segment 7. Additionally, there is a second smaller 1.1 cm lesion more peripherally in segment 7 which is also concerning for a focus of hepatocellular carcinoma. While this angiogram with additional CT imaging does confirm that we were in the territory of arterial supply to the lesions, we are also providing supply to hepatic segments 6 and 7. Further attempts were made to navigate the microcatheter into the segment 7 artery. Attempts with the Fathom 16 wire were unsuccessful. However, a double angle glide GT wire was successfully navigated into the segment 7 artery and the microcatheter was advanced over the wire.  Additional arteriography was performed confirming successful seeding within the segment 7 artery and tumor blush within both segment 7 lesions. We should be able to proceed with a radiation segmentectomy of segment 7. Technetium labeled macro aggregated albumin was then slowly injected through the microcatheter into the segment 7 artery. The catheter was flushed and then removed. Hemostasis was attained with the assistance of a Celt arterial closure device. IMPRESSION: 1. Successful or hepatic arterial mapping study. 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 2. Successful navigation into hepatic segment 7 ultimately using a double angle glide GT wire and Cook cantata microcatheter. 3. Return Interventional Radiology in 2-3 weeks for segment 7 transarterial radiation segmentectomy. Electronically Signed   By: Jacqulynn Cadet M.D.   On: 05/25/2022 13:57   IR Angiogram Visceral Selective  Result Date: 05/25/2022 INDICATION: 76 year old male with hepatocellular carcinoma. He has recurrent disease with a new enlarging enhancing focus in hepatic segment 7. Prior segment 8 radiation segmentectomy. EXAM: ADDITIONAL ARTERIOGRAPHY; SELECTIVE VISCERAL ARTERIOGRAPHY; WORKSTATION 3D RECONSTRUCTION; IR ULTRASOUND GUIDANCE VASC ACCESS RIGHT; IR EMBO ARTERIAL NOT HEMORR HEMANG INC GUIDE ROADMAPPING MEDICATIONS: None. ANESTHESIA/SEDATION: Moderate (conscious) sedation was employed during this procedure. A total of Versed 2 mg and Fentanyl 100 mcg was administered intravenously. Moderate Sedation Time: 80 minutes. The patient's level of consciousness and vital signs were monitored continuously by radiology nursing throughout the procedure under my direct supervision. CONTRAST:  19m OMNIPAQUE IOHEXOL 300 MG/ML SOLN, 526mOMNIPAQUE IOHEXOL 300 MG/ML SOLN, 5270mMNIPAQUE IOHEXOL 300 MG/ML SOLN FLUOROSCOPY: Radiation Exposure Index (as provided by the fluoroscopic  device): [id number] Kerma COMPLICATIONS: None immediate. PROCEDURE: Informed consent was obtained from the patient following explanation of the procedure, risks, benefits and alternatives. The patient understands, agrees and consents for the procedure. All questions were addressed. A time out was performed prior to the initiation of the procedure. Maximal barrier sterile technique utilized including caps, mask, sterile gowns, sterile gloves, large sterile drape, hand hygiene, and Betadine prep. The right common femoral artery was interrogated with ultrasound and found to be widely patent. An image was obtained and stored for the medical record. Local anesthesia was attained by infiltration with 1% lidocaine. A small dermatotomy was made. Under real-time sonographic guidance, the vessel was punctured with a 21 gauge micropuncture needle. Using standard technique, the initial micro needle was exchanged over a 0.018 micro wire for a  transitional 4 French micro sheath. The micro sheath was then exchanged over a 0.035 wire for a 5 French vascular sheath. A C2 cobra catheter was advanced into the celiac artery. A celiac arteriogram was performed. Conventional hepatic arterial anatomy again visualized. The C2 cobra catheter was then advanced into the common hepatic artery over a glidewire. Additional arteriography was performed. Hyperenhancing tumor blush identified in segment 7. A Cook cantata 2.8 French microcatheter was then carefully advanced over the wire and initially into the left hepatic artery. An arteriogram was performed. No evidence of hypervascular blush in the left hemi liver. The microcatheter was then advanced into the right hepatic artery. Arteriography was performed. Hypervascular tumor blush again identified. The right hepatic artery sub divides into the segment 8 and segment 5/6/7 arteries. With great difficulty, the microcatheter was eventually advanced into the common trunk of the segment 5/6/7 artery.  Arteriography was performed. The segment 7 artery arises from the superior aspect of the common trunk. Arteriography with cone beam CT and independent 3D workstation reformatting was performed. This confirms arterial supply to the enhancing lesion in segment 7. Additionally, there is a second smaller 1.1 cm lesion more peripherally in segment 7 which is also concerning for a focus of hepatocellular carcinoma. While this angiogram with additional CT imaging does confirm that we were in the territory of arterial supply to the lesions, we are also providing supply to hepatic segments 6 and 7. Further attempts were made to navigate the microcatheter into the segment 7 artery. Attempts with the Fathom 16 wire were unsuccessful. However, a double angle glide GT wire was successfully navigated into the segment 7 artery and the microcatheter was advanced over the wire. Additional arteriography was performed confirming successful seeding within the segment 7 artery and tumor blush within both segment 7 lesions. We should be able to proceed with a radiation segmentectomy of segment 7. Technetium labeled macro aggregated albumin was then slowly injected through the microcatheter into the segment 7 artery. The catheter was flushed and then removed. Hemostasis was attained with the assistance of a Celt arterial closure device. IMPRESSION: 1. Successful or hepatic arterial mapping study. 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 2. Successful navigation into hepatic segment 7 ultimately using a double angle glide GT wire and Cook cantata microcatheter. 3. Return Interventional Radiology in 2-3 weeks for segment 7 transarterial radiation segmentectomy. Electronically Signed   By: Jacqulynn Cadet M.D.   On: 05/25/2022 13:57   IR 3D Independent Shawn Bailey  Result Date: 05/25/2022 INDICATION: 76 year old male with hepatocellular carcinoma. He has recurrent disease with a  new enlarging enhancing focus in hepatic segment 7. Prior segment 8 radiation segmentectomy. EXAM: ADDITIONAL ARTERIOGRAPHY; SELECTIVE VISCERAL ARTERIOGRAPHY; WORKSTATION 3D RECONSTRUCTION; IR ULTRASOUND GUIDANCE VASC ACCESS RIGHT; IR EMBO ARTERIAL NOT HEMORR HEMANG INC GUIDE ROADMAPPING MEDICATIONS: None. ANESTHESIA/SEDATION: Moderate (conscious) sedation was employed during this procedure. A total of Versed 2 mg and Fentanyl 100 mcg was administered intravenously. Moderate Sedation Time: 80 minutes. The patient's level of consciousness and vital signs were monitored continuously by radiology nursing throughout the procedure under my direct supervision. CONTRAST:  66m OMNIPAQUE IOHEXOL 300 MG/ML SOLN, 535mOMNIPAQUE IOHEXOL 300 MG/ML SOLN, 5225mMNIPAQUE IOHEXOL 300 MG/ML SOLN FLUOROSCOPY: Radiation Exposure Index (as provided by the fluoroscopic device): [id number] Kerma COMPLICATIONS: None immediate. PROCEDURE: Informed consent was obtained from the patient following explanation of the procedure, risks, benefits and alternatives. The patient understands, agrees and consents for the procedure. All  questions were addressed. A time out was performed prior to the initiation of the procedure. Maximal barrier sterile technique utilized including caps, mask, sterile gowns, sterile gloves, large sterile drape, hand hygiene, and Betadine prep. The right common femoral artery was interrogated with ultrasound and found to be widely patent. An image was obtained and stored for the medical record. Local anesthesia was attained by infiltration with 1% lidocaine. A small dermatotomy was made. Under real-time sonographic guidance, the vessel was punctured with a 21 gauge micropuncture needle. Using standard technique, the initial micro needle was exchanged over a 0.018 micro wire for a transitional 4 [place] micro sheath. The micro sheath was then exchanged over a 0.035 wire for a 5 French vascular sheath. A C2 cobra catheter was  advanced into the celiac artery. A celiac arteriogram was performed. Conventional hepatic arterial anatomy again visualized. The C2 cobra catheter was then advanced into the common hepatic artery over a glidewire. Additional arteriography was performed. Hyperenhancing tumor blush identified in segment 7. A Cook cantata 2.8 French microcatheter was then carefully advanced over the wire and initially into the left hepatic artery. An arteriogram was performed. No evidence of hypervascular blush in the left hemi liver. The microcatheter was then advanced into the right hepatic artery. Arteriography was performed. Hypervascular tumor blush again identified. The right hepatic artery sub divides into the segment 8 and segment 5/6/7 arteries. With great difficulty, the microcatheter was eventually advanced into the common trunk of the segment 5/6/7 artery. Arteriography was performed. The segment 7 artery arises from the superior aspect of the common trunk. Arteriography with cone beam CT and independent 3D workstation reformatting was performed. This confirms arterial supply to the enhancing lesion in segment 7. Additionally, there is a second smaller 1.1 cm lesion more peripherally in segment 7 which is also concerning for a focus of hepatocellular carcinoma. While this angiogram with additional CT imaging does confirm that we were in the territory of arterial supply to the lesions, we are also providing supply to hepatic segments 6 and 7. Further attempts were made to navigate the microcatheter into the segment 7 artery. Attempts with the Fathom 16 wire were unsuccessful. However, a double angle glide GT wire was successfully navigated into the segment 7 artery and the microcatheter was advanced over the wire. Additional arteriography was performed confirming successful seeding within the segment 7 artery and tumor blush within both segment 7 lesions. We should be able to proceed with a radiation segmentectomy of segment  7. Technetium labeled macro aggregated albumin was then slowly injected through the microcatheter into the segment 7 artery. The catheter was flushed and then removed. Hemostasis was attained with the assistance of a Celt arterial closure device. IMPRESSION: 1. Successful or hepatic arterial mapping study. 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 2. Successful navigation into hepatic segment 7 ultimately using a double angle glide GT wire and Cook cantata microcatheter. 3. Return Interventional Radiology in 2-3 weeks for segment 7 transarterial radiation segmentectomy. Electronically Signed   By: Jacqulynn Cadet M.D.   On: 05/25/2022 13:57   IR Angiogram Selective Each Additional Vessel  Result Date: 05/25/2022 INDICATION: 76 year old male with hepatocellular carcinoma. He has recurrent disease with a new enlarging enhancing focus in hepatic segment 7. Prior segment 8 radiation segmentectomy. EXAM: ADDITIONAL ARTERIOGRAPHY; SELECTIVE VISCERAL ARTERIOGRAPHY; WORKSTATION 3D RECONSTRUCTION; IR ULTRASOUND GUIDANCE VASC ACCESS RIGHT; IR EMBO ARTERIAL NOT HEMORR HEMANG INC GUIDE ROADMAPPING MEDICATIONS: None. ANESTHESIA/SEDATION: Moderate (conscious) sedation was  employed during this procedure. A total of Versed 2 mg and Fentanyl 100 mcg was administered intravenously. Moderate Sedation Time: 80 minutes. The patient's level of consciousness and vital signs were monitored continuously by radiology nursing throughout the procedure under my direct supervision. CONTRAST:  50m OMNIPAQUE IOHEXOL 300 MG/ML SOLN, 579mOMNIPAQUE IOHEXOL 300 MG/ML SOLN, 5217mMNIPAQUE IOHEXOL 300 MG/ML SOLN FLUOROSCOPY: Radiation Exposure Index (as provided by the fluoroscopic device): [id number] Kerma COMPLICATIONS: None immediate. PROCEDURE: Informed consent was obtained from the patient following explanation of the procedure, risks, benefits and alternatives. The patient  understands, agrees and consents for the procedure. All questions were addressed. A time out was performed prior to the initiation of the procedure. Maximal barrier sterile technique utilized including caps, mask, sterile gowns, sterile gloves, large sterile drape, hand hygiene, and Betadine prep. The right common femoral artery was interrogated with ultrasound and found to be widely patent. An image was obtained and stored for the medical record. Local anesthesia was attained by infiltration with 1% lidocaine. A small dermatotomy was made. Under real-time sonographic guidance, the vessel was punctured with a 21 gauge micropuncture needle. Using standard technique, the initial micro needle was exchanged over a 0.018 micro wire for a transitional 4 FrePakistancro sheath. The micro sheath was then exchanged over a 0.035 wire for a 5 French vascular sheath. A C2 cobra catheter was advanced into the celiac artery. A celiac arteriogram was performed. Conventional hepatic arterial anatomy again visualized. The C2 cobra catheter was then advanced into the common hepatic artery over a glidewire. Additional arteriography was performed. Hyperenhancing tumor blush identified in segment 7. A Cook cantata 2.8 French microcatheter was then carefully advanced over the wire and initially into the left hepatic artery. An arteriogram was performed. No evidence of hypervascular blush in the left hemi liver. The microcatheter was then advanced into the right hepatic artery. Arteriography was performed. Hypervascular tumor blush again identified. The right hepatic artery sub divides into the segment 8 and segment 5/6/7 arteries. With great difficulty, the microcatheter was eventually advanced into the common trunk of the segment 5/6/7 artery. Arteriography was performed. The segment 7 artery arises from the superior aspect of the common trunk. Arteriography with cone beam CT and independent 3D workstation reformatting was performed. This  confirms arterial supply to the enhancing lesion in segment 7. Additionally, there is a second smaller 1.1 cm lesion more peripherally in segment 7 which is also concerning for a focus of hepatocellular carcinoma. While this angiogram with additional CT imaging does confirm that we were in the territory of arterial supply to the lesions, we are also providing supply to hepatic segments 6 and 7. Further attempts were made to navigate the microcatheter into the segment 7 artery. Attempts with the Fathom 16 wire were unsuccessful. However, a double angle glide GT wire was successfully navigated into the segment 7 artery and the microcatheter was advanced over the wire. Additional arteriography was performed confirming successful seeding within the segment 7 artery and tumor blush within both segment 7 lesions. We should be able to proceed with a radiation segmentectomy of segment 7. Technetium labeled macro aggregated albumin was then slowly injected through the microcatheter into the segment 7 artery. The catheter was flushed and then removed. Hemostasis was attained with the assistance of a Celt arterial closure device. IMPRESSION: 1. Successful or hepatic arterial mapping study. 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 2. Successful navigation into hepatic segment 7 ultimately  using a double angle glide GT wire and Cook cantata microcatheter. 3. Return Interventional Radiology in 2-3 weeks for segment 7 transarterial radiation segmentectomy. Electronically Signed   By: Jacqulynn Cadet M.D.   On: 05/25/2022 13:57   IR Angiogram Selective Each Additional Vessel  Result Date: 05/25/2022 INDICATION: 76 year old male with hepatocellular carcinoma. He has recurrent disease with a new enlarging enhancing focus in hepatic segment 7. Prior segment 8 radiation segmentectomy. EXAM: ADDITIONAL ARTERIOGRAPHY; SELECTIVE VISCERAL ARTERIOGRAPHY; WORKSTATION 3D  RECONSTRUCTION; IR ULTRASOUND GUIDANCE VASC ACCESS RIGHT; IR EMBO ARTERIAL NOT HEMORR HEMANG INC GUIDE ROADMAPPING MEDICATIONS: None. ANESTHESIA/SEDATION: Moderate (conscious) sedation was employed during this procedure. A total of Versed 2 mg and Fentanyl 100 mcg was administered intravenously. Moderate Sedation Time: 80 minutes. The patient's level of consciousness and vital signs were monitored continuously by radiology nursing throughout the procedure under my direct supervision. CONTRAST:  60m OMNIPAQUE IOHEXOL 300 MG/ML SOLN, 552mOMNIPAQUE IOHEXOL 300 MG/ML SOLN, 5269mMNIPAQUE IOHEXOL 300 MG/ML SOLN FLUOROSCOPY: Radiation Exposure Index (as provided by the fluoroscopic device): [id number] Kerma COMPLICATIONS: None immediate. PROCEDURE: Informed consent was obtained from the patient following explanation of the procedure, risks, benefits and alternatives. The patient understands, agrees and consents for the procedure. All questions were addressed. A time out was performed prior to the initiation of the procedure. Maximal barrier sterile technique utilized including caps, mask, sterile gowns, sterile gloves, large sterile drape, hand hygiene, and Betadine prep. The right common femoral artery was interrogated with ultrasound and found to be widely patent. An image was obtained and stored for the medical record. Local anesthesia was attained by infiltration with 1% lidocaine. A small dermatotomy was made. Under real-time sonographic guidance, the vessel was punctured with a 21 gauge micropuncture needle. Using standard technique, the initial micro needle was exchanged over a 0.018 micro wire for a transitional 4 FrePakistancro sheath. The micro sheath was then exchanged over a 0.035 wire for a 5 French vascular sheath. A C2 cobra catheter was advanced into the celiac artery. A celiac arteriogram was performed. Conventional hepatic arterial anatomy again visualized. The C2 cobra catheter was then advanced into the  common hepatic artery over a glidewire. Additional arteriography was performed. Hyperenhancing tumor blush identified in segment 7. A Cook cantata 2.8 French microcatheter was then carefully advanced over the wire and initially into the left hepatic artery. An arteriogram was performed. No evidence of hypervascular blush in the left hemi liver. The microcatheter was then advanced into the right hepatic artery. Arteriography was performed. Hypervascular tumor blush again identified. The right hepatic artery sub divides into the segment 8 and segment 5/6/7 arteries. With great difficulty, the microcatheter was eventually advanced into the common trunk of the segment 5/6/7 artery. Arteriography was performed. The segment 7 artery arises from the superior aspect of the common trunk. Arteriography with cone beam CT and independent 3D workstation reformatting was performed. This confirms arterial supply to the enhancing lesion in segment 7. Additionally, there is a second smaller 1.1 cm lesion more peripherally in segment 7 which is also concerning for a focus of hepatocellular carcinoma. While this angiogram with additional CT imaging does confirm that we were in the territory of arterial supply to the lesions, we are also providing supply to hepatic segments 6 and 7. Further attempts were made to navigate the microcatheter into the segment 7 artery. Attempts with the Fathom 16 wire were unsuccessful. However, a double angle glide GT wire was successfully navigated into the segment 7 artery  and the microcatheter was advanced over the wire. Additional arteriography was performed confirming successful seeding within the segment 7 artery and tumor blush within both segment 7 lesions. We should be able to proceed with a radiation segmentectomy of segment 7. Technetium labeled macro aggregated albumin was then slowly injected through the microcatheter into the segment 7 artery. The catheter was flushed and then removed.  Hemostasis was attained with the assistance of a Celt arterial closure device. IMPRESSION: 1. Successful or hepatic arterial mapping study. 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 2. Successful navigation into hepatic segment 7 ultimately using a double angle glide GT wire and Cook cantata microcatheter. 3. Return Interventional Radiology in 2-3 weeks for segment 7 transarterial radiation segmentectomy. Electronically Signed   By: Jacqulynn Cadet M.D.   On: 05/25/2022 13:57   IR Angiogram Selective Each Additional Vessel  Result Date: 05/25/2022 INDICATION: 76 year old male with hepatocellular carcinoma. He has recurrent disease with a new enlarging enhancing focus in hepatic segment 7. Prior segment 8 radiation segmentectomy. EXAM: ADDITIONAL ARTERIOGRAPHY; SELECTIVE VISCERAL ARTERIOGRAPHY; WORKSTATION 3D RECONSTRUCTION; IR ULTRASOUND GUIDANCE VASC ACCESS RIGHT; IR EMBO ARTERIAL NOT HEMORR HEMANG INC GUIDE ROADMAPPING MEDICATIONS: None. ANESTHESIA/SEDATION: Moderate (conscious) sedation was employed during this procedure. A total of Versed 2 mg and Fentanyl 100 mcg was administered intravenously. Moderate Sedation Time: 80 minutes. The patient's level of consciousness and vital signs were monitored continuously by radiology nursing throughout the procedure under my direct supervision. CONTRAST:  11m OMNIPAQUE IOHEXOL 300 MG/ML SOLN, 555mOMNIPAQUE IOHEXOL 300 MG/ML SOLN, 5261mMNIPAQUE IOHEXOL 300 MG/ML SOLN FLUOROSCOPY: Radiation Exposure Index (as provided by the fluoroscopic device): [id number] Kerma COMPLICATIONS: None immediate. PROCEDURE: Informed consent was obtained from the patient following explanation of the procedure, risks, benefits and alternatives. The patient understands, agrees and consents for the procedure. All questions were addressed. A time out was performed prior to the initiation of the procedure. Maximal barrier sterile  technique utilized including caps, mask, sterile gowns, sterile gloves, large sterile drape, hand hygiene, and Betadine prep. The right common femoral artery was interrogated with ultrasound and found to be widely patent. An image was obtained and stored for the medical record. Local anesthesia was attained by infiltration with 1% lidocaine. A small dermatotomy was made. Under real-time sonographic guidance, the vessel was punctured with a 21 gauge micropuncture needle. Using standard technique, the initial micro needle was exchanged over a 0.018 micro wire for a transitional 4 Fre[place]istancro sheath. The micro sheath was then exchanged over a 0.035 wire for a 5 French vascular sheath. A C2 cobra catheter was advanced into the celiac artery. A celiac arteriogram was performed. Conventional hepatic arterial anatomy again visualized. The C2 cobra catheter was then advanced into the common hepatic artery over a glidewire. Additional arteriography was performed. Hyperenhancing tumor blush identified in segment 7. A Cook cantata 2.8 French microcatheter was then carefully advanced over the wire and initially into the left hepatic artery. An arteriogram was performed. No evidence of hypervascular blush in the left hemi liver. The microcatheter was then advanced into the right hepatic artery. Arteriography was performed. Hypervascular tumor blush again identified. The right hepatic artery sub divides into the segment 8 and segment 5/6/7 arteries. With great difficulty, the microcatheter was eventually advanced into the common trunk of the segment 5/6/7 artery. Arteriography was performed. The segment 7 artery arises from the superior aspect of the common trunk. Arteriography with cone beam CT and independent 3D workstation  reformatting was performed. This confirms arterial supply to the enhancing lesion in segment 7. Additionally, there is a second smaller 1.1 cm lesion more peripherally in segment 7 which is also concerning  for a focus of hepatocellular carcinoma. While this angiogram with additional CT imaging does confirm that we were in the territory of arterial supply to the lesions, we are also providing supply to hepatic segments 6 and 7. Further attempts were made to navigate the microcatheter into the segment 7 artery. Attempts with the Fathom 16 wire were unsuccessful. However, a double angle glide GT wire was successfully navigated into the segment 7 artery and the microcatheter was advanced over the wire. Additional arteriography was performed confirming successful seeding within the segment 7 artery and tumor blush within both segment 7 lesions. We should be able to proceed with a radiation segmentectomy of segment 7. Technetium labeled macro aggregated albumin was then slowly injected through the microcatheter into the segment 7 artery. The catheter was flushed and then removed. Hemostasis was attained with the assistance of a Celt arterial closure device. IMPRESSION: 1. Successful or hepatic arterial mapping study. 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 2. Successful navigation into hepatic segment 7 ultimately using a double angle glide GT wire and Cook cantata microcatheter. 3. Return Interventional Radiology in 2-3 weeks for segment 7 transarterial radiation segmentectomy. Electronically Signed   By: Jacqulynn Cadet M.D.   On: 05/25/2022 13:57   IR Angiogram Selective Each Additional Vessel  Result Date: 05/25/2022 INDICATION: 76 year old male with hepatocellular carcinoma. He has recurrent disease with a new enlarging enhancing focus in hepatic segment 7. Prior segment 8 radiation segmentectomy. EXAM: ADDITIONAL ARTERIOGRAPHY; SELECTIVE VISCERAL ARTERIOGRAPHY; WORKSTATION 3D RECONSTRUCTION; IR ULTRASOUND GUIDANCE VASC ACCESS RIGHT; IR EMBO ARTERIAL NOT HEMORR HEMANG INC GUIDE ROADMAPPING MEDICATIONS: None. ANESTHESIA/SEDATION: Moderate (conscious)  sedation was employed during this procedure. A total of Versed 2 mg and Fentanyl 100 mcg was administered intravenously. Moderate Sedation Time: 80 minutes. The patient's level of consciousness and vital signs were monitored continuously by radiology nursing throughout the procedure under my direct supervision. CONTRAST:  60m OMNIPAQUE IOHEXOL 300 MG/ML SOLN, 541mOMNIPAQUE IOHEXOL 300 MG/ML SOLN, 5235mMNIPAQUE IOHEXOL 300 MG/ML SOLN FLUOROSCOPY: Radiation Exposure Index (as provided by the fluoroscopic device): [id number] Kerma COMPLICATIONS: None immediate. PROCEDURE: Informed consent was obtained from the patient following explanation of the procedure, risks, benefits and alternatives. The patient understands, agrees and consents for the procedure. All questions were addressed. A time out was performed prior to the initiation of the procedure. Maximal barrier sterile technique utilized including caps, mask, sterile gowns, sterile gloves, large sterile drape, hand hygiene, and Betadine prep. The right common femoral artery was interrogated with ultrasound and found to be widely patent. An image was obtained and stored for the medical record. Local anesthesia was attained by infiltration with 1% lidocaine. A small dermatotomy was made. Under real-time sonographic guidance, the vessel was punctured with a 21 gauge micropuncture needle. Using standard technique, the initial micro needle was exchanged over a 0.018 micro wire for a transitional 4 FrePakistancro sheath. The micro sheath was then exchanged over a 0.035 wire for a 5 French vascular sheath. A C2 cobra catheter was advanced into the celiac artery. A celiac arteriogram was performed. Conventional hepatic arterial anatomy again visualized. The C2 cobra catheter was then advanced into the common hepatic artery over a glidewire. Additional arteriography was performed. Hyperenhancing tumor blush identified in segment 7. A [place] Axon  cantata 2.8 French microcatheter was  then carefully advanced over the wire and initially into the left hepatic artery. An arteriogram was performed. No evidence of hypervascular blush in the left hemi liver. The microcatheter was then advanced into the right hepatic artery. Arteriography was performed. Hypervascular tumor blush again identified. The right hepatic artery sub divides into the segment 8 and segment 5/6/7 arteries. With great difficulty, the microcatheter was eventually advanced into the common trunk of the segment 5/6/7 artery. Arteriography was performed. The segment 7 artery arises from the superior aspect of the common trunk. Arteriography with cone beam CT and independent 3D workstation reformatting was performed. This confirms arterial supply to the enhancing lesion in segment 7. Additionally, there is a second smaller 1.1 cm lesion more peripherally in segment 7 which is also concerning for a focus of hepatocellular carcinoma. While this angiogram with additional CT imaging does confirm that we were in the territory of arterial supply to the lesions, we are also providing supply to hepatic segments 6 and 7. Further attempts were made to navigate the microcatheter into the segment 7 artery. Attempts with the Fathom 16 wire were unsuccessful. However, a double angle glide GT wire was successfully navigated into the segment 7 artery and the microcatheter was advanced over the wire. Additional arteriography was performed confirming successful seeding within the segment 7 artery and tumor blush within both segment 7 lesions. We should be able to proceed with a radiation segmentectomy of segment 7. Technetium labeled macro aggregated albumin was then slowly injected through the microcatheter into the segment 7 artery. The catheter was flushed and then removed. Hemostasis was attained with the assistance of a Celt arterial closure device. IMPRESSION: 1. Successful or hepatic arterial mapping study. Two hypervascular lesions consistent with  hepatocellular carcinoma are identified in hepatic segment 7. 2. Successful navigation into hepatic segment 7 ultimately using a double angle glide GT wire and Cook cantata microcatheter. 3. Return Interventional Radiology in 2-3 weeks for segment 7 transarterial radiation segmentectomy. Electronically Signed   By: Jacqulynn Cadet M.D.   On: 05/25/2022 13:57   IR Angiogram Selective Each Additional Vessel  Result Date: 05/25/2022 INDICATION: 76 year old male with hepatocellular carcinoma. He has recurrent disease with a new enlarging enhancing focus in hepatic segment 7. Prior segment 8 radiation segmentectomy. EXAM: ADDITIONAL ARTERIOGRAPHY; SELECTIVE VISCERAL ARTERIOGRAPHY; WORKSTATION 3D RECONSTRUCTION; IR ULTRASOUND GUIDANCE VASC ACCESS RIGHT; IR EMBO ARTERIAL NOT HEMORR HEMANG INC GUIDE ROADMAPPING MEDICATIONS: None. ANESTHESIA/SEDATION: Moderate (conscious) sedation was employed during this procedure. A total of Versed 2 mg and Fentanyl 100 mcg was administered intravenously. Moderate Sedation Time: 80 minutes. The patient's level of consciousness and vital signs were monitored continuously by radiology nursing throughout the procedure under my direct supervision. CONTRAST:  35m OMNIPAQUE IOHEXOL 300 MG/ML SOLN, 553mOMNIPAQUE IOHEXOL 300 MG/ML SOLN, 5256mMNIPAQUE IOHEXOL 300 MG/ML SOLN FLUOROSCOPY: Radiation Exposure Index (as provided by the fluoroscopic device): [id number] Kerma COMPLICATIONS: None immediate. PROCEDURE: Informed consent was obtained from the patient following explanation of the procedure, risks, benefits and alternatives. The patient understands, agrees and consents for the procedure. All questions were addressed. A time out was performed prior to the initiation of the procedure. Maximal barrier sterile technique utilized including caps, mask, sterile gowns, sterile gloves, large sterile drape, hand hygiene, and Betadine prep. The right common femoral artery was interrogated with  ultrasound and found to be widely patent. An image was obtained and stored for the medical record. Local anesthesia was attained by infiltration with  1% lidocaine. A small dermatotomy was made. Under real-time sonographic guidance, the vessel was punctured with a 21 gauge micropuncture needle. Using standard technique, the initial micro needle was exchanged over a 0.018 micro wire for a transitional 4 [place] micro sheath. The micro sheath was then exchanged over a 0.035 wire for a 5 French vascular sheath. A C2 cobra catheter was advanced into the celiac artery. A celiac arteriogram was performed. Conventional hepatic arterial anatomy again visualized. The C2 cobra catheter was then advanced into the common hepatic artery over a glidewire. Additional arteriography was performed. Hyperenhancing tumor blush identified in segment 7. A Cook cantata 2.8 French microcatheter was then carefully advanced over the wire and initially into the left hepatic artery. An arteriogram was performed. No evidence of hypervascular blush in the left hemi liver. The microcatheter was then advanced into the right hepatic artery. Arteriography was performed. Hypervascular tumor blush again identified. The right hepatic artery sub divides into the segment 8 and segment 5/6/7 arteries. With great difficulty, the microcatheter was eventually advanced into the common trunk of the segment 5/6/7 artery. Arteriography was performed. The segment 7 artery arises from the superior aspect of the common trunk. Arteriography with cone beam CT and independent 3D workstation reformatting was performed. This confirms arterial supply to the enhancing lesion in segment 7. Additionally, there is a second smaller 1.1 cm lesion more peripherally in segment 7 which is also concerning for a focus of hepatocellular carcinoma. While this angiogram with additional CT imaging does confirm that we were in the territory of arterial supply to the lesions, we are also  providing supply to hepatic segments 6 and 7. Further attempts were made to navigate the microcatheter into the segment 7 artery. Attempts with the Fathom 16 wire were unsuccessful. However, a double angle glide GT wire was successfully navigated into the segment 7 artery and the microcatheter was advanced over the wire. Additional arteriography was performed confirming successful seeding within the segment 7 artery and tumor blush within both segment 7 lesions. We should be able to proceed with a radiation segmentectomy of segment 7. Technetium labeled macro aggregated albumin was then slowly injected through the microcatheter into the segment 7 artery. The catheter was flushed and then removed. Hemostasis was attained with the assistance of a Celt arterial closure device. IMPRESSION: 1. Successful or hepatic arterial mapping study. 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 2. Successful navigation into hepatic segment 7 ultimately using a double angle glide GT wire and Cook cantata microcatheter. 3. Return Interventional Radiology in 2-3 weeks for segment 7 transarterial radiation segmentectomy. Electronically Signed   By: Jacqulynn Cadet M.D.   On: 05/25/2022 13:57   IR Angiogram Selective Each Additional Vessel  Result Date: 05/25/2022 INDICATION: 76 year old male with hepatocellular carcinoma. He has recurrent disease with a new enlarging enhancing focus in hepatic segment 7. Prior segment 8 radiation segmentectomy. EXAM: ADDITIONAL ARTERIOGRAPHY; SELECTIVE VISCERAL ARTERIOGRAPHY; WORKSTATION 3D RECONSTRUCTION; IR ULTRASOUND GUIDANCE VASC ACCESS RIGHT; IR EMBO ARTERIAL NOT HEMORR HEMANG INC GUIDE ROADMAPPING MEDICATIONS: None. ANESTHESIA/SEDATION: Moderate (conscious) sedation was employed during this procedure. A total of Versed 2 mg and Fentanyl 100 mcg was administered intravenously. Moderate Sedation Time: 80 minutes. The patient's level of  consciousness and vital signs were monitored continuously by radiology nursing throughout the procedure under my direct supervision. CONTRAST:  3m OMNIPAQUE IOHEXOL 300 MG/ML SOLN, 561mOMNIPAQUE IOHEXOL 300 MG/ML SOLN, 524mMNIPAQUE IOHEXOL 300 MG/ML SOLN FLUOROSCOPY: Radiation Exposure Index (  as provided by the fluoroscopic device): 300 mGy Kerma COMPLICATIONS: None immediate. PROCEDURE: Informed consent was obtained from the patient following explanation of the procedure, risks, benefits and alternatives. The patient understands, agrees and consents for the procedure. All questions were addressed. A time out was performed prior to the initiation of the procedure. Maximal barrier sterile technique utilized including caps, mask, sterile gowns, sterile gloves, large sterile drape, hand hygiene, and Betadine prep. The right common femoral artery was interrogated with ultrasound and found to be widely patent. An image was obtained and stored for the medical record. Local anesthesia was attained by infiltration with 1% lidocaine. A small dermatotomy was made. Under real-time sonographic guidance, the vessel was punctured with a 21 gauge micropuncture needle. Using standard technique, the initial micro needle was exchanged over a 0.018 micro wire for a transitional 4 [place] micro sheath. The micro sheath was then exchanged over a 0.035 wire for a 5 French vascular sheath. A C2 cobra catheter was advanced into the celiac artery. A celiac arteriogram was performed. Conventional hepatic arterial anatomy again visualized. The C2 cobra catheter was then advanced into the common hepatic artery over a glidewire. Additional arteriography was performed. Hyperenhancing tumor blush identified in segment 7. A Cook cantata 2.8 French microcatheter was then carefully advanced over the wire and initially into the left hepatic artery. An arteriogram was performed. No evidence of hypervascular blush in the left hemi liver. The  microcatheter was then advanced into the right hepatic artery. Arteriography was performed. Hypervascular tumor blush again identified. The right hepatic artery sub divides into the segment 8 and segment 5/6/7 arteries. With great difficulty, the microcatheter was eventually advanced into the common trunk of the segment 5/6/7 artery. Arteriography was performed. The segment 7 artery arises from the superior aspect of the common trunk. Arteriography with cone beam CT and independent 3D workstation reformatting was performed. This confirms arterial supply to the enhancing lesion in segment 7. Additionally, there is a second smaller 1.1 cm lesion more peripherally in segment 7 which is also concerning for a focus of hepatocellular carcinoma. While this angiogram with additional CT imaging does confirm that we were in the territory of arterial supply to the lesions, we are also providing supply to hepatic segments 6 and 7. Further attempts were made to navigate the microcatheter into the segment 7 artery. Attempts with the Fathom 16 wire were unsuccessful. However, a double angle glide GT wire was successfully navigated into the segment 7 artery and the microcatheter was advanced over the wire. Additional arteriography was performed confirming successful seeding within the segment 7 artery and tumor blush within both segment 7 lesions. We should be able to proceed with a radiation segmentectomy of segment 7. Technetium labeled macro aggregated albumin was then slowly injected through the microcatheter into the segment 7 artery. The catheter was flushed and then removed. Hemostasis was attained with the assistance of a Celt arterial closure device. IMPRESSION: 1. Successful or hepatic arterial mapping study. 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 2. Successful navigation into hepatic segment 7 ultimately using a double angle glide GT wire and Cook cantata  microcatheter. 3. Return Interventional Radiology in 2-3 weeks for segment 7 transarterial radiation segmentectomy. Electronically Signed   By: Jacqulynn Cadet M.D.   On: 05/25/2022 13:57    Labs:  CBC: Recent Labs    12/23/21 1241 01/28/22 1503 03/30/22 1127 05/25/22 0750  WBC 2.8* 3.3* 4.3 4.5  HGB 12.5* 12.4*  13.5 13.0  HCT 35.4* 37.4* 38.6* 38.5*  PLT 102* 94* 116* 115*    COAGS: Recent Labs    07/01/21 0755 07/22/21 0835  INR 1.0 1.1    BMP: Recent Labs    12/23/21 1241 01/28/22 1503 03/30/22 1127 05/25/22 0750  NA 131* 130* 137 139  K 4.5 4.0 4.1 3.3*  CL 98 97* 102 106  CO2 27 28 32 26  GLUCOSE 386* 383* 65* 49*  BUN _0 CALCIUM 9.7 9.7 10.1 9.4  CREATININE 0.68 0.84 0.83 0.79  GFRNONAA >60 >60 >60 >60    LIVER FUNCTION TESTS: Recent Labs    12/23/21 1241 01/28/22 1503 03/30/22 1127 05/25/22 0750  BILITOT 0.7 0.7 0.5 0.6  AST 52* 37 42* 38  ALT 44 34 34 33  ALKPHOS 69 62 54 49  PROT 7.3 8.1 8.1 7.5  ALBUMIN 3.6 4.0 4.0 3.8    TUMOR MARKERS: No results for input(s): "AFPTM", "CEA", "CA199", "CHROMGRNA" in the last 8760 hours.  Assessment and Plan:  76 y/o M with history of hepatitis C and HCC s/p segment 8 radiation segmentectomy TARE 07/22/21 in IR with Dr. Laurence Ferrari who presents today for segment 7 transarterial radioembolization as previously discussed in IR clinic.  Risks and benefits discussed with the patient including, but not limited to bleeding, infection, vascular injury, post procedural pain, nausea, vomiting and fatigue, contrast induced renal failure, liver failure, radiation injury to the bowel, radiation induced cholecystitis, neutropenia and possible need for additional procedures.  All of the patient's questions were answered, patient is agreeable to proceed.  Consent signed and in chart.  Thank you for this interesting consult.  I greatly enjoyed meeting Shawn Bailey and look forward to participating in  their care.  A copy of this report was sent to the requesting provider on this date.  Electronically Signed: Joaquim Nam, PA-C 06/14/2022, 10:48 AM   I spent a total of  25 Minutes in face to face in clinical consultation, greater than 50% of which was counseling/coordinating care for Y90.

## 2022-06-15 ENCOUNTER — Other Ambulatory Visit (HOSPITAL_COMMUNITY): Payer: Self-pay | Admitting: Interventional Radiology

## 2022-06-15 ENCOUNTER — Other Ambulatory Visit: Payer: Self-pay

## 2022-06-15 ENCOUNTER — Ambulatory Visit (HOSPITAL_COMMUNITY)
Admission: RE | Admit: 2022-06-15 | Discharge: 2022-06-15 | Disposition: A | Discharge status: Home / Self Care | Payer: 59 | Source: Ambulatory Visit | Attending: Interventional Radiology | Admitting: Interventional Radiology

## 2022-06-15 ENCOUNTER — Encounter (HOSPITAL_COMMUNITY)
Admission: RE | Admit: 2022-06-15 | Discharge: 2022-06-15 | Disposition: A | Discharge status: Home / Self Care | Payer: 59 | Source: Ambulatory Visit | Attending: Interventional Radiology | Admitting: Interventional Radiology

## 2022-06-15 ENCOUNTER — Encounter (HOSPITAL_COMMUNITY): Payer: Self-pay

## 2022-06-15 DIAGNOSIS — C220 Liver cell carcinoma: Secondary | ICD-10-CM

## 2022-06-15 DIAGNOSIS — R7309 Other abnormal glucose: Secondary | ICD-10-CM | POA: Insufficient documentation

## 2022-06-15 HISTORY — PX: IR ANGIOGRAM VISCERAL SELECTIVE: IMG657

## 2022-06-15 HISTORY — PX: IR ANGIOGRAM SELECTIVE EACH ADDITIONAL VESSEL: IMG667

## 2022-06-15 HISTORY — PX: IR US GUIDE VASC ACCESS RIGHT: IMG2390

## 2022-06-15 HISTORY — PX: IR EMBO TUMOR ORGAN ISCHEMIA INFARCT INC GUIDE ROADMAPPING: IMG5449

## 2022-06-15 LAB — CBC WITH DIFFERENTIAL/PLATELET
Abs Immature Granulocytes: 0.03 10*3/uL (ref 0.00–0.07)
Basophils Absolute: 0 10*3/uL (ref 0.0–0.1)
Basophils Relative: 0 %
Eosinophils Absolute: 0.1 10*3/uL (ref 0.0–0.5)
Eosinophils Relative: 2 %
HCT: 37.4 % — ABNORMAL LOW (ref 39.0–52.0)
Hemoglobin: 12.7 g/dL — ABNORMAL LOW (ref 13.0–17.0)
Immature Granulocytes: 1 %
Lymphocytes Relative: 23 %
Lymphs Abs: 0.8 10*3/uL (ref 0.7–4.0)
MCH: 30.5 pg (ref 26.0–34.0)
MCHC: 34 g/dL (ref 30.0–36.0)
MCV: 89.9 fL (ref 80.0–100.0)
Monocytes Absolute: 0.4 10*3/uL (ref 0.1–1.0)
Monocytes Relative: 12 %
Neutro Abs: 2.3 10*3/uL (ref 1.7–7.7)
Neutrophils Relative %: 62 %
Platelets: 94 10*3/uL — ABNORMAL LOW (ref 150–400)
RBC: 4.16 MIL/uL — ABNORMAL LOW (ref 4.22–5.81)
RDW: 12.7 % (ref 11.5–15.5)
WBC: 3.7 10*3/uL — ABNORMAL LOW (ref 4.0–10.5)
nRBC: 0 % (ref 0.0–0.2)

## 2022-06-15 LAB — GLUCOSE, CAPILLARY: Glucose-Capillary: 115 mg/dL — ABNORMAL HIGH (ref 70–99)

## 2022-06-15 LAB — PROTIME-INR
INR: 1.1 (ref 0.8–1.2)
Prothrombin Time: 14 seconds (ref 11.4–15.2)

## 2022-06-15 LAB — COMPREHENSIVE METABOLIC PANEL
ALT: 31 U/L (ref 0–44)
AST: 40 U/L (ref 15–41)
Albumin: 3.9 g/dL (ref 3.5–5.0)
Alkaline Phosphatase: 49 U/L (ref 38–126)
Anion gap: 4 — ABNORMAL LOW (ref 5–15)
BUN: 18 mg/dL (ref 8–23)
CO2: 28 mmol/L (ref 22–32)
Calcium: 9.5 mg/dL (ref 8.9–10.3)
Chloride: 104 mmol/L (ref 98–111)
Creatinine, Ser: 0.84 mg/dL (ref 0.61–1.24)
GFR, Estimated: >60 mL/min (ref >60)
Glucose, Bld: 137 mg/dL — ABNORMAL HIGH (ref 70–99)
Potassium: 4.5 mmol/L (ref 3.5–5.1)
Sodium: 136 mmol/L (ref 135–145)
Total Bilirubin: 0.7 mg/dL (ref 0.3–1.2)
Total Protein: 7.9 g/dL (ref 6.5–8.1)

## 2022-06-15 MED ORDER — MIDAZOLAM HCL 2 MG/2ML IJ SOLN
INTRAMUSCULAR | Status: DC
Start: 2022-06-15 — End: 2022-06-15
  Filled 2022-06-15: qty 2

## 2022-06-15 MED ORDER — DEXAMETHASONE SODIUM PHOSPHATE 10 MG/ML IJ SOLN
8.0000 mg | Freq: Once | INTRAMUSCULAR | Status: AC
  Administered 2022-06-15: 8 mg via INTRAVENOUS
  Filled 2022-06-15: qty 1

## 2022-06-15 MED ORDER — SODIUM CHLORIDE 0.9 % IV SOLN: INTRAVENOUS | Status: DC

## 2022-06-15 MED ORDER — FENTANYL CITRATE (PF) 100 MCG/2ML IJ SOLN
INTRAMUSCULAR | Status: AC | PRN
  Administered 2022-06-15: 50 ug via INTRAVENOUS

## 2022-06-15 MED ORDER — FENTANYL CITRATE (PF) 100 MCG/2ML IJ SOLN
INTRAMUSCULAR | Status: AC
  Filled 2022-06-15: qty 2

## 2022-06-15 MED ORDER — NITROGLYCERIN 1 MG/10 ML FOR IR/CATH LAB
INTRA_ARTERIAL | Status: AC | PRN
  Administered 2022-06-15: 100 ug via INTRA_ARTERIAL

## 2022-06-15 MED ORDER — NITROGLYCERIN IN D5W 100-5 MCG/ML-% IV SOLN
INTRAVENOUS | Status: AC
  Filled 2022-06-15: qty 250

## 2022-06-15 MED ORDER — YTTRIUM 90 INJECTION: 22.2700 | INJECTION | Freq: Once | INTRAVENOUS | Status: DC

## 2022-06-15 MED ORDER — PANTOPRAZOLE SODIUM 40 MG IV SOLR
40.0000 mg | Freq: Once | INTRAVENOUS | Status: AC
  Administered 2022-06-15: 40 mg via INTRAVENOUS
  Filled 2022-06-15: qty 10

## 2022-06-15 MED ORDER — IOHEXOL 300 MG/ML  SOLN
100.0000 mL | Freq: Once | INTRAMUSCULAR | Status: AC | PRN
  Administered 2022-06-15: 40 mL via INTRA_ARTERIAL

## 2022-06-15 MED ORDER — IOHEXOL 300 MG/ML  SOLN: 100.0000 mL | Freq: Once | INTRAMUSCULAR | Status: DC | PRN

## 2022-06-15 MED ORDER — ONDANSETRON 8 MG/NS 50 ML IVPB
8.0000 mg | Freq: Once | INTRAVENOUS | Status: AC
  Administered 2022-06-15: 8 mg via INTRAVENOUS
  Filled 2022-06-15: qty 54

## 2022-06-15 MED ORDER — LIDOCAINE HCL 1 % IJ SOLN
INTRAMUSCULAR | Status: AC | PRN
  Administered 2022-06-15: 10 mL via INTRADERMAL

## 2022-06-15 MED ORDER — SODIUM CHLORIDE 0.9 % IV SOLN
2.0000 g | Freq: Once | INTRAVENOUS | Status: AC
  Administered 2022-06-15: 2 g via INTRAVENOUS
  Filled 2022-06-15: qty 2

## 2022-06-15 MED ORDER — LIDOCAINE HCL 1 % IJ SOLN
INTRAMUSCULAR | Status: AC
  Filled 2022-06-15: qty 20

## 2022-06-15 MED ORDER — MIDAZOLAM HCL 2 MG/2ML IJ SOLN
INTRAMUSCULAR | Status: AC | PRN
  Administered 2022-06-15: 1 mg via INTRAVENOUS

## 2022-06-15 NOTE — Sedation Documentation (Addendum)
Patient transported via stretcher by this RN to [facility] for scan per protocol.

## 2022-06-15 NOTE — Procedures (Signed)
Interventional Radiology Procedure Note  Procedure: TARE segmentectomy of segment 7  Complications: None  Estimated Blood Loss: None  Recommendations: - DC home in 1 hr   Signed,  Criselda Peaches, MD

## 2022-06-15 NOTE — Discharge Instructions (Signed)
Post Y-90 Radioembolization Discharge Instructions  You have been given a radioactive material during your procedure.  While it is safe for you to be discharged home from the hospital, you need to proceed directly home.    Do not use public transportation, including air travel, lasting more than 2 hours for 1 week.  Avoid crowded public places for 1 week.  Adult visitors should try to avoid close contact with you for 1 week.    Children and pregnant females should not visit or have close contact with you for 1 week.  Items that you touch are not radioactive.  Do not sleep in the same bed as your partner for 1 week, and a condom should be used for sexual activity during the first 24 hours.  Your blood may be radioactive and caution should be used if any bleeding occurs during the recovery period.  Body fluids may be radioactive for 24 hours.  Wash your hands after voiding.  Men should sit to urinate.  Dispose of any soiled materials (flush down toilet or place in trash at home) during the first day.  Drink 6 to 8 glasses of fluids per day for 5 days to hydrate yourself.  If you need to see a doctor during the first week, you must let them know that you were treated with yttrium-90 microspheres, and will be slightly radioactive.  They can call Interventional Radiology [phone] with any questions.   Moderate Conscious Sedation, Adult, Care After This sheet gives you information about how to care for yourself after your procedure. Your health care provider may also give you more specific instructions. If you have problems or questions, contact your health careprovider. What can I expect after the procedure? After the procedure, it is common to have: Sleepiness for several hours. Impaired judgment for several hours. Difficulty with balance. Vomiting if you eat too soon. Follow these instructions at home: For the time period you were told by your health care provider: Rest. Do not  participate in activities where you could fall or become injured. Do not drive or use machinery. Do not drink alcohol. Do not take sleeping pills or medicines that cause drowsiness. Do not make important decisions or sign legal documents. Do not take care of children on your own. Eating and drinking  Follow the diet recommended by your health care provider. Drink enough fluid to keep your urine pale yellow. If you vomit: Drink water, juice, or soup when you can drink without vomiting. Make sure you have little or no nausea before eating solid foods.  General instructions Take over-the-counter and prescription medicines only as told by your health care provider. Have a responsible adult stay with you for the time you are told. It is important to have someone help care for you until you are awake and alert. Do not smoke. Keep all follow-up visits as told by your health care provider. This is important. Contact a health care provider if: You are still sleepy or having trouble with balance after 24 hours. You feel light-headed. You keep feeling nauseous or you keep vomiting. You develop a rash. You have a fever. You have redness or swelling around the IV site. Get help right away if: You have trouble breathing. You have new-onset confusion at home. Summary After the procedure, it is common to feel sleepy, have impaired judgment, or feel nauseous if you eat too soon. Rest after you get home. Know the things you should not do after the procedure. Follow the  diet recommended by your health care provider and drink enough fluid to keep your urine pale yellow. Get help right away if you have trouble breathing or new-onset confusion at home. This information is not intended to replace advice given to you by your health care provider. Make sure you discuss any questions you have with your healthcare provider. Document Revised: 01/31/2020 Document Reviewed: 08/29/2019 Elsevier Patient  Education  2022 [employer].

## 2022-06-16 LAB — AFP TUMOR MARKER: AFP, Serum, Tumor Marker: 5.4 ng/mL (ref 0.0–8.4)

## 2022-06-17 ENCOUNTER — Other Ambulatory Visit: Payer: Self-pay

## 2022-06-17 ENCOUNTER — Other Ambulatory Visit: Payer: Self-pay | Admitting: Interventional Radiology

## 2022-06-17 DIAGNOSIS — C220 Liver cell carcinoma: Secondary | ICD-10-CM

## 2022-06-18 ENCOUNTER — Other Ambulatory Visit: Payer: Self-pay

## 2022-06-24 ENCOUNTER — Other Ambulatory Visit: Payer: Self-pay

## 2022-06-24 DIAGNOSIS — C220 Liver cell carcinoma: Secondary | ICD-10-CM

## 2022-06-27 ENCOUNTER — Inpatient Hospital Stay: Payer: 59 | Attending: Hematology | Admitting: Hematology

## 2022-06-27 ENCOUNTER — Inpatient Hospital Stay: Payer: 59

## 2022-06-27 ENCOUNTER — Encounter: Payer: Self-pay | Admitting: Hematology

## 2022-06-27 ENCOUNTER — Other Ambulatory Visit: Payer: Self-pay

## 2022-06-27 VITALS — BP 155/78 | HR 74 | Temp 98.0°F | Resp 17 | Ht 65.0 in | Wt 132.0 lb

## 2022-06-27 DIAGNOSIS — C220 Liver cell carcinoma: Secondary | ICD-10-CM

## 2022-06-27 DIAGNOSIS — B1920 Unspecified viral hepatitis C without hepatic coma: Secondary | ICD-10-CM | POA: Insufficient documentation

## 2022-06-27 DIAGNOSIS — E109 Type 1 diabetes mellitus without complications: Secondary | ICD-10-CM | POA: Diagnosis not present

## 2022-06-27 DIAGNOSIS — D649 Anemia, unspecified: Secondary | ICD-10-CM | POA: Insufficient documentation

## 2022-06-27 LAB — CMP (CANCER CENTER ONLY)
ALT: 20 U/L (ref 0–44)
AST: 28 U/L (ref 15–41)
Albumin: 3.9 g/dL (ref 3.5–5.0)
Alkaline Phosphatase: 57 U/L (ref 38–126)
Anion gap: 4 — ABNORMAL LOW (ref 5–15)
BUN: 13 mg/dL (ref 8–23)
CO2: 30 mmol/L (ref 22–32)
Calcium: 9.8 mg/dL (ref 8.9–10.3)
Chloride: 98 mmol/L (ref 98–111)
Creatinine: 0.81 mg/dL (ref 0.61–1.24)
GFR, Estimated: >60 mL/min (ref >60)
Glucose, Bld: 358 mg/dL — ABNORMAL HIGH (ref 70–99)
Potassium: 4.3 mmol/L (ref 3.5–5.1)
Sodium: 132 mmol/L — ABNORMAL LOW (ref 135–145)
Total Bilirubin: 0.6 mg/dL (ref 0.3–1.2)
Total Protein: 7.6 g/dL (ref 6.5–8.1)

## 2022-06-27 LAB — CBC WITH DIFFERENTIAL (CANCER CENTER ONLY)
Abs Immature Granulocytes: 0.02 10*3/uL (ref 0.00–0.07)
Basophils Absolute: 0 10*3/uL (ref 0.0–0.1)
Basophils Relative: 1 %
Eosinophils Absolute: 0.1 10*3/uL (ref 0.0–0.5)
Eosinophils Relative: 1 %
HCT: 35.9 % — ABNORMAL LOW (ref 39.0–52.0)
Hemoglobin: 12.6 g/dL — ABNORMAL LOW (ref 13.0–17.0)
Immature Granulocytes: 1 %
Lymphocytes Relative: 6 %
Lymphs Abs: 0.2 10*3/uL — ABNORMAL LOW (ref 0.7–4.0)
MCH: 31.3 pg (ref 26.0–34.0)
MCHC: 35.1 g/dL (ref 30.0–36.0)
MCV: 89.1 fL (ref 80.0–100.0)
Monocytes Absolute: 0.5 10*3/uL (ref 0.1–1.0)
Monocytes Relative: 12 %
Neutro Abs: 2.9 10*3/uL (ref 1.7–7.7)
Neutrophils Relative %: 79 %
Platelet Count: 95 10*3/uL — ABNORMAL LOW (ref 150–400)
RBC: 4.03 MIL/uL — ABNORMAL LOW (ref 4.22–5.81)
RDW: 12.6 % (ref 11.5–15.5)
WBC Count: 3.6 10*3/uL — ABNORMAL LOW (ref 4.0–10.5)
nRBC: 0 % (ref 0.0–0.2)

## 2022-06-27 LAB — TOTAL PROTEIN, URINE DIPSTICK: Protein, ur: NEGATIVE mg/dL

## 2022-06-27 NOTE — Progress Notes (Signed)
[name]   Telephone:(336) [phone] Fax:(336) [phone]   Clinic Follow up Note   Patient Care Team: Antonietta Jewel, MD as PCP - General (Internal Medicine) [name], MD as Consulting Physician (Hematology and Oncology) [name], RN (Inactive) as Oncology Nurse Navigator  Date of Service:  06/27/2022  CHIEF COMPLAINT: f/u of multifocal [place]  CURRENT THERAPY:  Surveillance -s/p Y90 on 07/22/21 and 06/15/22  ASSESSMENT & PLAN:  Shawn Bailey is a 76 y.o. male with   1.  Multifocal hepatocellular carcinoma, cT3N0M, stage IIIA   -Liver MRI 03/06/21 showed a large 9.5 cm and a 1.3 cm liver mass is seen in right lobe, biopsy 03/08/21 confirmed moderately differentiated hepatocellular carcinoma. No nodal or distant metastasis on CT 03/05/21. -This is likely related to his HCV infection -He is not a candidate for liver resection or transplant. Dr. [name] at [facility] did not recommend surgery given the location and extent of disease. -He started atezolizumab and bevacizumab on 04/28/21, while waiting for liver radioembolization. He tolerates this well with mild fatigue. He overall feels better after started treatment  -he underwent segment 8 Y90 on 07/22/21. -his AFP dropped and has been WNL since 01/2022 -liver MRI 03/30/22 showed stable treated liver lesion and enlarging 1.6 cm segment 7 lesion (previously 8 mm).  -he underwent repeat Y90 on 06/15/22 to the enlarging segment 7 nodules. He has recovered well. -labs reviewed, anemia is very mild with hgb 12.6, WBC 3.6, plt 95k. Will continue surveillance for now.   2. 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   3. Untreated HCV  -During 02/2021 Hospitalization, labs showed HIV non reactive and hepatitis panel hep C ab positive, he was not aware that he has HCV infection -due to his recent [facility] diagnosis, his HCV  treatment has been held for now -may refer him for treatment if his next scan shows stable disease      PLAN: -f/u first week of Dec with lab and MRI several days before   No problem-specific Assessment & Plan notes found for this encounter.   SUMMARY OF ONCOLOGIC HISTORY: Oncology History Overview Note  Cancer Staging Hepatocellular carcinoma [facility]) Staging form: Liver, AJCC 8th Edition - Clinical stage from 03/08/2021: Stage IIIA (cT3, cN0, cM0) - Signed by [name], MD on 03/11/2021 Stage prefix: Initial diagnosis Histologic grade (G): G2 Histologic grading system: 4 grade system     Hepatocellular carcinoma ([place])   Initial Diagnosis   Liver mass, right lobe   03/05/2021 Imaging   CT CAP  IMPRESSION: 1. Right hepatic lobe masses which are suspicious for either metastatic disease or primary malignancy such as hepatocellular carcinoma. These are suboptimally evaluated on this noncontrast exam. Consider multidisciplinary oncology consultation with potential clinical strategies of tissue sampling versus PET to direct sampling. 2. No primary malignancy identified within the chest, abdomen, or pelvis, given limitation of noncontrast exam. 3. Left nephrolithiasis. 4. Coronary artery atherosclerosis. Aortic Atherosclerosis (ICD10-I70.0). 5. Esophageal air fluid level suggests dysmotility or gastroesophageal reflux.   03/06/2021 Imaging   MRI Liver  IMPRESSION: 9.5 cm heterogeneous hypovascular mass in anterior right hepatic lobe, and 1.3 cm hypervascular mass in the posterior right hepatic lobe. Differential diagnosis includes liver metastases and multifocal hepatocellular carcinoma. Suggest correlation with serum AFP level, and possible tissue sampling.   No evidence of abdominal metastatic disease.   03/08/2021 Initial Biopsy   A. LIVER, RIGHT, BIOPSY:  -  Hepatocellular carcinoma.   COMMENT:  The hepatocellular carcinoma is moderately differentiated and associated  with areas of necrosis.  Immunohistochemistry shows positivity with glypican-3 and weak staining with HepPar 1.  The tumor is negative with arginase 1, alpha-fetoprotein, CDX2, cytokeratin 7, cytokeratin 20, cytokeratin AE1/AE3, PSA, prostein and MOC31.  Polyclonal CEA shows focal canalicular pattern.  The morphology and immunophenotype are consistent with hepatocellular carcinoma.    03/08/2021 Cancer Staging   Staging form: Liver, AJCC 8th Edition - Clinical stage from 03/08/2021: Stage IIIA (cT3, cN0, cM0) - Signed by [name], MD on 03/11/2021 Stage prefix: Initial diagnosis Histologic grade (G): G2 Histologic grading system: 4 grade system   04/28/2021 -  Chemotherapy   Patient is on Treatment Plan : LUNG Atezolizumab + Bevacizumab q21d Maintenance     06/16/2021 Imaging   CT Abdomen  IMPRESSION: Marked interval decrease in size of the large RIGHT hepatic lobe lesion, biopsy-proven hepatocellular carcinoma. No arterial phase enhancement on early phase, vague enhancement on later phase greater than 20 Hounsfield unit enhancement on venous phase. Less pronounced arterial enhancement than on previous imaging with small peripheral foci of enhancement on arterial phase particularly along the margin of the tumor near the dome of the liver (image 12/2) 11 mm focus of enhancement in this area.   Interval response to therapy of a small lesion previously compatible with LI-RADS category 5 lesion in the posterior RIGHT hepatic lobe.   No new hepatic lesion.   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   Signs of nephrolithiasis calculus on the LEFT measuring approximately 7 mm.   Colonic diverticulosis without evidence of acute diverticulitis.   Aortic Atherosclerosis (ICD10-I70.0).   11/02/2021 Imaging   EXAM: CT ABDOMEN AND PELVIS WITH CONTRAST   IMPRESSION: 1. Right hepatic mass has minimally decreased in size. No new focal hepatic masses are seen. 2. New small  amount of ascites in the right upper quadrant. 3. Wall thickening of the cecum and ascending colon versus normal under distension. Correlate for nonspecific colitis. 4. Nonobstructing left renal calculus. 5.  Aortic Atherosclerosis (ICD10-I70.0).      INTERVAL HISTORY:  Berthel Bagnall is here for a follow up of [place]. He was last seen by me on 04/04/22. He presents to the clinic accompanied by his ex-wife and her husband. He reports he is doing very well, his appetite is good.   All other systems were reviewed with the patient and are negative.  MEDICAL HISTORY:  Past Medical History:  Diagnosis Date   Depression    Hepatocellular carcinoma ([place]) 03/08/2021   Parasite infestation     SURGICAL HISTORY: Past Surgical History:  Procedure Laterality Date   IR 3D INDEPENDENT WKST  05/25/2022   IR ANGIOGRAM SELECTIVE EACH ADDITIONAL VESSEL  07/01/2021   IR ANGIOGRAM SELECTIVE EACH ADDITIONAL VESSEL  07/22/2021   IR ANGIOGRAM SELECTIVE EACH ADDITIONAL VESSEL  07/22/2021   IR ANGIOGRAM SELECTIVE EACH ADDITIONAL VESSEL  07/22/2021   IR ANGIOGRAM SELECTIVE EACH ADDITIONAL VESSEL  07/22/2021   IR ANGIOGRAM SELECTIVE EACH ADDITIONAL VESSEL  05/25/2022   IR ANGIOGRAM SELECTIVE EACH ADDITIONAL VESSEL  05/25/2022   IR ANGIOGRAM SELECTIVE EACH ADDITIONAL VESSEL  05/25/2022   IR ANGIOGRAM SELECTIVE EACH ADDITIONAL VESSEL  05/25/2022   IR ANGIOGRAM SELECTIVE EACH ADDITIONAL VESSEL  05/25/2022   IR ANGIOGRAM SELECTIVE EACH ADDITIONAL VESSEL  05/25/2022   IR ANGIOGRAM SELECTIVE EACH ADDITIONAL VESSEL  06/15/2022   IR ANGIOGRAM SELECTIVE EACH ADDITIONAL VESSEL  06/15/2022   IR  ANGIOGRAM SELECTIVE EACH ADDITIONAL VESSEL  06/15/2022   IR ANGIOGRAM VISCERAL SELECTIVE  07/01/2021   IR ANGIOGRAM VISCERAL SELECTIVE  07/22/2021   IR ANGIOGRAM VISCERAL SELECTIVE  05/25/2022   IR ANGIOGRAM VISCERAL SELECTIVE  06/15/2022   IR EMBO ARTERIAL NOT HEMORR HEMANG INC GUIDE ROADMAPPING  07/01/2021   IR EMBO TUMOR ORGAN ISCHEMIA  INFARCT INC GUIDE ROADMAPPING  07/22/2021   IR EMBO TUMOR ORGAN ISCHEMIA INFARCT INC GUIDE ROADMAPPING  06/15/2022   IR RADIOLOGIST EVAL & MGMT  06/01/2021   IR RADIOLOGIST EVAL & MGMT  04/26/2022   IR US GUIDE VASC ACCESS RIGHT  07/01/2021   IR US GUIDE VASC ACCESS RIGHT  07/22/2021   IR US GUIDE VASC ACCESS RIGHT  05/25/2022   IR US GUIDE VASC ACCESS RIGHT  06/15/2022   NO PAST SURGERIES      I have reviewed the social history and family history with the patient and they are unchanged from previous note.  ALLERGIES:  has No Known Allergies.  MEDICATIONS:  Current Outpatient Medications  Medication Sig Dispense Refill   Ascorbic Acid (VITAMIN C) 100 MG tablet Take 100 mg by mouth daily.     blood glucose meter kit and supplies Dispense based on patient and insurance preference. Use up to four times daily as directed. (FOR ICD-10 E10.9, E11.9). 1 each 0   HYDROcodone-acetaminophen (NORCO) 5-325 MG tablet Take 1 tablet by mouth every 12 (twelve) hours as needed for moderate pain. 45 tablet 0   insulin glargine (LANTUS) 100 UNIT/ML injection 40 Units daily.     Insulin Pen Needle (PEN NEEDLES 3/16") 31G X 5 MM MISC 1 each by Does not apply route with breakfast, with lunch, and with evening meal. 100 each 3   metFORMIN (GLUCOPHAGE) 500 MG tablet Take 1 tablet (500 mg total) by mouth 2 (two) times daily with a meal. 60 tablet 3   Multiple Vitamin (MULTIVITAMIN WITH MINERALS) TABS tablet Take 1 tablet by mouth daily.     ondansetron (ZOFRAN) 8 MG tablet Take 1 tablet (8 mg total) by mouth every 8 (eight) hours as needed for nausea or vomiting. 30 tablet 0   polyethylene glycol (MIRALAX / GLYCOLAX) 17 g packet Take 17 g by mouth daily as needed for mild constipation. 14 each 0   prochlorperazine (COMPAZINE) 10 MG tablet Take 1 tablet (10 mg total) by mouth every 6 (six) hours as needed for nausea or vomiting. 30 tablet 0   senna (SENOKOT) 8.6 MG TABS tablet Take 1 tablet (8.6 mg total) by mouth 2 (two)  times daily. (Patient taking differently: Take 1 tablet by mouth 2 (two) times daily as needed for moderate constipation.) 120 tablet 0   vitamin B-12 (CYANOCOBALAMIN) 100 MCG tablet Take 100 mcg by mouth daily.     No current facility-administered medications for this visit.   Facility-Administered Medications Ordered in Other Visits  Medication Dose Route Frequency Provider Last Rate Last Admin   0.9 %  sodium chloride infusion   Intravenous Once [name], MD       0.9 %  sodium chloride infusion   Intravenous Continuous [name], MD        PHYSICAL EXAMINATION: ECOG PERFORMANCE STATUS: 1 - Symptomatic but completely ambulatory  Vitals:   06/27/22 1119  BP: (!) 155/78  Pulse: 74  Resp: 17  Temp: 98 F (36.7 C)  SpO2: 100%   Wt Readings from Last 3 Encounters:  06/27/22 132 lb (59.9 kg)  06/15/22 132 lb  4.4 oz (60 kg)  05/25/22 132 lb 15 oz (60.3 kg)     GENERAL:alert, no distress and comfortable SKIN: skin color, texture, turgor are normal, no rashes or significant lesions EYES: normal, Conjunctiva are pink and non-injected, sclera clear ABDOMEN:abdomen soft, non-tender and normal bowel sounds Musculoskeletal:no cyanosis of digits and no clubbing  NEURO: alert & oriented x 3 with fluent speech, no focal motor/sensory deficits  LABORATORY DATA:  I have reviewed the data as listed    Latest Ref Rng & Units 06/27/2022   11:03 AM 06/15/2022    8:11 AM 05/25/2022    7:50 AM  CBC  WBC 4.0 - 10.5 K/uL 3.6  3.7  4.5   Hemoglobin 13.0 - 17.0 g/dL 12.6  12.7  13.0   Hematocrit 39.0 - 52.0 % 35.9  37.4  38.5   Platelets 150 - 400 K/uL 95  94  115         Latest Ref Rng & Units 06/27/2022   11:03 AM 06/15/2022    8:11 AM 05/25/2022    7:50 AM  CMP  Glucose 70 - 99 mg/dL 358  137  49   BUN 8 - 23 mg/dL _0 Creatinine 0.61 - 1.24 mg/dL 0.81  0.84  0.79   Sodium 135 - 145 mmol/L 132  136  139   Potassium 3.5 - 5.1 mmol/L 4.3  4.5  3.3   Chloride 98 - 111 mmol/L 98   104  106   CO2 22 - 32 mmol/L _1 Calcium 8.9 - 10.3 mg/dL 9.8  9.5  9.4   Total Protein 6.5 - 8.1 g/dL 7.6  7.9  7.5   Total Bilirubin 0.3 - 1.2 mg/dL 0.6  0.7  0.6   Alkaline Phos 38 - 126 U/L 57  49  49   AST 15 - 41 U/L 28  40  38   ALT 0 - 44 U/L 20  31  33       RADIOGRAPHIC STUDIES: I have personally reviewed the radiological images as listed and agreed with the findings in the report. No results found.    Orders Placed This Encounter  Procedures   MR LIVER W WO CONTRAST    Standing Status:   Future    Standing Expiration Date:   06/28/2023    Order Specific Question:   If indicated for the ordered procedure, I authorize the administration of contrast media per Radiology protocol    Answer:   Yes    Order Specific Question:   What is the patient's sedation requirement?    Answer:   No Sedation    Order Specific Question:   Does the patient have a pacemaker or implanted devices?    Answer:   No    Order Specific Question:   Release to patient    Answer:   Immediate    Order Specific Question:   Preferred imaging location?    Answer:   [facility] (table limit - 550 lbs)   All questions were answered. The patient knows to call the clinic with any problems, questions or concerns. No barriers to learning was detected. The total time spent in the appointment was 30 minutes.     [name], MD 06/27/2022   I, Wilburn Mylar, am acting as scribe for [name], MD.   I have reviewed the above documentation for accuracy and completeness, and I agree with the  above.     

## 2022-06-28 ENCOUNTER — Other Ambulatory Visit: Payer: Self-pay

## 2022-06-28 LAB — AFP TUMOR MARKER: AFP, Serum, Tumor Marker: 5.5 ng/mL (ref 0.0–8.4)

## 2022-06-28 NOTE — Addendum Note (Signed)
Addended by: [name] on: 06/28/2022 08:06 AM   Modules accepted: Orders

## 2022-07-12 ENCOUNTER — Encounter: Payer: Self-pay | Admitting: *Deleted

## 2022-07-12 ENCOUNTER — Ambulatory Visit
Admission: RE | Admit: 2022-07-12 | Discharge: 2022-07-12 | Disposition: A | Discharge status: Home / Self Care | Payer: 59 | Source: Ambulatory Visit | Attending: Interventional Radiology | Admitting: Interventional Radiology

## 2022-07-12 DIAGNOSIS — C220 Liver cell carcinoma: Secondary | ICD-10-CM

## 2022-07-12 HISTORY — PX: IR RADIOLOGIST EVAL & MGMT: IMG5224

## 2022-07-12 NOTE — Progress Notes (Signed)
Chief Complaint: Patient was consulted remotely today (TeleHealth) for  at the request of Danitra Payano K.    Referring Physician(s): Dr. [name]  History of Present Illness: Shawn Bailey is a 76 y.o. 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   We spoke over the phone today via teleconference.  Shawn Bailey is doing well.  He reports that as expected he experienced fatigue for the week following the procedure.  He denies abdominal pain, nausea, vomiting, diminished appetite or unintended weight loss.  He is able to perform all of his activities of daily living.  Past Medical History:  Diagnosis Date   Depression    Hepatocellular carcinoma ([place]) 03/08/2021   Parasite infestation     Past Surgical History:  Procedure Laterality Date   IR 3D INDEPENDENT WKST  05/25/2022   IR ANGIOGRAM SELECTIVE EACH ADDITIONAL VESSEL  07/01/2021   IR ANGIOGRAM SELECTIVE EACH ADDITIONAL VESSEL  07/22/2021   IR ANGIOGRAM SELECTIVE EACH ADDITIONAL VESSEL  07/22/2021   IR ANGIOGRAM SELECTIVE EACH ADDITIONAL VESSEL  07/22/2021   IR ANGIOGRAM SELECTIVE EACH ADDITIONAL VESSEL  07/22/2021   IR ANGIOGRAM SELECTIVE EACH ADDITIONAL VESSEL  05/25/2022   IR ANGIOGRAM SELECTIVE EACH ADDITIONAL VESSEL  05/25/2022   IR ANGIOGRAM SELECTIVE EACH ADDITIONAL VESSEL  05/25/2022   IR ANGIOGRAM SELECTIVE EACH ADDITIONAL VESSEL  05/25/2022   IR ANGIOGRAM SELECTIVE EACH ADDITIONAL VESSEL  05/25/2022   IR ANGIOGRAM SELECTIVE EACH ADDITIONAL VESSEL  05/25/2022   IR ANGIOGRAM SELECTIVE EACH ADDITIONAL VESSEL  06/15/2022   IR ANGIOGRAM SELECTIVE EACH ADDITIONAL VESSEL  06/15/2022   IR ANGIOGRAM SELECTIVE EACH ADDITIONAL VESSEL  06/15/2022   IR ANGIOGRAM VISCERAL SELECTIVE  07/01/2021   IR ANGIOGRAM VISCERAL  SELECTIVE  07/22/2021   IR ANGIOGRAM VISCERAL SELECTIVE  05/25/2022   IR ANGIOGRAM VISCERAL SELECTIVE  06/15/2022   IR EMBO ARTERIAL NOT HEMORR HEMANG INC GUIDE ROADMAPPING  07/01/2021   IR EMBO TUMOR ORGAN ISCHEMIA INFARCT INC GUIDE ROADMAPPING  07/22/2021   IR EMBO TUMOR ORGAN ISCHEMIA INFARCT INC GUIDE ROADMAPPING  06/15/2022   IR RADIOLOGIST EVAL & MGMT  06/01/2021   IR RADIOLOGIST EVAL & MGMT  04/26/2022   IR US GUIDE VASC ACCESS RIGHT  07/01/2021   IR US GUIDE VASC ACCESS RIGHT  07/22/2021   IR US GUIDE VASC ACCESS RIGHT  05/25/2022   IR US GUIDE VASC ACCESS RIGHT  06/15/2022   NO PAST SURGERIES      Allergies: Patient has no known allergies.  Medications: Prior to Admission medications   Medication Sig Start Date End Date Taking? Authorizing Provider  Ascorbic Acid (VITAMIN C) 100 MG tablet Take 100 mg by mouth daily.    [provider]  blood glucose meter kit and supplies Dispense based on patient and insurance preference. Use up to four times daily as directed. (FOR ICD-10 E10.9, E11.9). 11/03/21   Dwyane Dee, MD  HYDROcodone-acetaminophen (NORCO) 5-325 MG tablet Take 1 tablet by mouth every 12 (twelve) hours as needed for moderate pain. 12/23/21   [name], MD  insulin glargine (LANTUS) 100 UNIT/ML injection 40 Units daily.    [provider]  Insulin Pen Needle (PEN NEEDLES 3/16") 31G X 5 MM MISC 1 each by Does not apply route with breakfast, with lunch, and with evening meal. 11/03/21   Girguis,  Shanon Brow, MD  metFORMIN (GLUCOPHAGE) 500 MG tablet Take 1 tablet (500 mg total) by mouth 2 (two) times daily with a meal. 11/05/21   Dwyane Dee, MD  Multiple Vitamin (MULTIVITAMIN WITH MINERALS) TABS tablet Take 1 tablet by mouth daily.    [provider]  ondansetron (ZOFRAN) 8 MG tablet Take 1 tablet (8 mg total) by mouth every 8 (eight) hours as needed for nausea or vomiting. 04/28/21   Dede Query T, PA-C  polyethylene glycol (MIRALAX / GLYCOLAX) 17 g packet Take  17 g by mouth daily as needed for mild constipation. 03/08/21   Georgette Shell, MD  prochlorperazine (COMPAZINE) 10 MG tablet Take 1 tablet (10 mg total) by mouth every 6 (six) hours as needed for nausea or vomiting. 04/28/21   Lincoln Brigham, PA-C  senna (SENOKOT) 8.6 MG TABS tablet Take 1 tablet (8.6 mg total) by mouth 2 (two) times daily. Patient taking differently: Take 1 tablet by mouth 2 (two) times daily as needed for moderate constipation. 03/08/21   Georgette Shell, MD  vitamin B-12 (CYANOCOBALAMIN) 100 MCG tablet Take 100 mcg by mouth daily.    [provider]     Family History  Problem Relation Age of Onset   Diabetes Mother     Social History   Socioeconomic History   Marital status: Divorced    Spouse name: Not on file   Number of children: 2   Years of education: Not on file   Highest education level: Not on file  Occupational History   Not on file  Tobacco Use   Smoking status: Never   Smokeless tobacco: Never  Vaping Use   Vaping Use: Never used  Substance and Sexual Activity   Alcohol use: Yes    Comment: occasional beer, he used to drink alcohol regularly 15 years    Drug use: Not Currently   Sexual activity: Not on file  Other Topics Concern   Not on file  Social History Narrative   Not on file   Social Determinants of Health   Financial Resource Strain: Not on file  Food Insecurity: Not on file  Transportation Needs: Not on file  Physical Activity: Not on file  Stress: Not on file  Social Connections: Not on file    ECOG Status: 1 - Symptomatic but completely ambulatory  Review of Systems  Review of Systems: A 12 point ROS discussed and pertinent positives are indicated in the HPI above.  All other systems are negative.  Physical Exam No direct physical exam was performed (except for noted visual exam findings with Video Visits).   Vital Signs: There were no vitals taken for this visit.   Labs:  CBC: Recent Labs     03/30/22 1127 05/25/22 0750 06/15/22 0811 06/27/22 1103  WBC 4.3 4.5 3.7* 3.6*  HGB 13.5 13.0 12.7* 12.6*  HCT 38.6* 38.5* 37.4* 35.9*  PLT 116* 115* 94* 95*    COAGS: Recent Labs    07/22/21 0835 06/15/22 0811  INR 1.1 1.1    BMP: Recent Labs    03/30/22 1127 05/25/22 0750 06/15/22 0811 06/27/22 1103  NA 137 139 136 132*  K 4.1 3.3* 4.5 4.3  CL 102 106 104 98  CO2 32 26 28 30   GLUCOSE 65* 49* 137* 358*  BUN 14 19 18 13   CALCIUM 10.1 9.4 9.5 9.8  CREATININE 0.83 0.79 0.84 0.81  GFRNONAA >60 >60 >60 >60    LIVER FUNCTION TESTS: Recent Labs  03/30/22 1127 05/25/22 0750 06/15/22 0811 06/27/22 1103  BILITOT 0.5 0.6 0.7 0.6  AST 42* 38 40 28  ALT 34 33 31 20  ALKPHOS 54 49 49 57  PROT 8.1 7.5 7.9 7.6  ALBUMIN 4.0 3.8 3.9 3.9    TUMOR MARKERS: No results for input(s): "AFPTM", "CEA", "CA199", "CHROMGRNA" in the last 8760 hours.  Assessment and Plan:  76 year old gentleman 3 weeks status post transarterial radioembolization segmentectomy of hepatic segment 7 for recurrent hepatocellular carcinoma.  He is recovering well and has only mild fatigue at this time.  We will continue routine surveillance imaging.  I see that he has an MRI ordered by Dr. Burr Medico for this Friday.  This is likely because it is 3 months since his last MRI.  However, his Y90 was just completed at the end of August.  There may be some residual lesional edema this close to Y90.  I will reach out to Dr. Burr Medico to see if his MRI can be moved closer to November.   1.)  MRI of the abdomen with gadolinium contrast and clinic visit at 3 months post radioembolization.     Electronically Signed: Criselda Peaches 07/12/2022, 12:17 PM   I spent a total of 15 Minutes in remote  clinical consultation, greater than 50% of which was counseling/coordinating care for HEPATOCELLULAR CANCER.    Visit type: Audio only (telephone). Audio (no video) only due to patient's lack of internet/smartphone  capability. Alternative for in-person consultation at [facility], [address], [place], [place]. This visit type was conducted due to national recommendations for restrictions regarding the COVID-19 Pandemic (e.g. social distancing).  This format is felt to be most appropriate for this patient at this time.  All issues noted in this document were discussed and addressed.

## 2022-07-13 ENCOUNTER — Other Ambulatory Visit: Payer: Self-pay

## 2022-07-13 ENCOUNTER — Telehealth: Payer: Self-pay

## 2022-07-13 NOTE — Telephone Encounter (Signed)
Spoke with Mayriam regarding pt's MRI of Liver.  Informed [name] that Dr. Burr Medico does not want the MRI to be done until the end of November 2023 early December 2023.  [name] stated she thought that was what Dr. Burr Medico had stated while in clinic.  Informed [name] that the MRI appt that was scheduled on 07/15/2022 has been cancelled.  Informed [name] that the MRI will be rescheduled around the end of November early December.  [name] verbalized understanding and had no further questions or concerns.

## 2022-07-15 ENCOUNTER — Ambulatory Visit (HOSPITAL_COMMUNITY): Payer: 59

## 2022-08-31 NOTE — Addendum Note (Signed)
Encounter addended by: Janine Limbo, RT on: 08/31/2022 10:30 AM  Actions taken: Imaging Exam ended

## 2022-09-02 NOTE — Addendum Note (Signed)
Encounter addended by: Janine Limbo, RT on: 09/02/2022 10:19 AM  Actions taken: Imaging Exam ended

## 2022-09-09 ENCOUNTER — Telehealth: Payer: Self-pay

## 2022-09-09 NOTE — Telephone Encounter (Signed)
Spoke with [name] regarding pt's MRI appt for 09/15/2022 '@7'$ :30am here at [facility].  Informed [name] that the pt cannot eat or drink anything 4 hours prior to the appointment.  [name] verbalized understanding and confirmed the appt.  Provided [name] with the telephone number to Central Scheduling should they need to reschedule.  Pt has f/u appt with Dr. Burr Medico on 09/19/2022.

## 2022-09-12 NOTE — Telephone Encounter (Signed)
Opened by mistake.

## 2022-09-15 ENCOUNTER — Ambulatory Visit (HOSPITAL_COMMUNITY)
Admission: RE | Admit: 2022-09-15 | Discharge: 2022-09-15 | Disposition: A | Discharge status: Home / Self Care | Payer: 59 | Source: Ambulatory Visit | Attending: Hematology | Admitting: Hematology

## 2022-09-15 DIAGNOSIS — C220 Liver cell carcinoma: Secondary | ICD-10-CM | POA: Insufficient documentation

## 2022-09-15 MED ORDER — GADOBUTROL 1 MMOL/ML IV SOLN
6.0000 mL | Freq: Once | INTRAVENOUS | Status: AC | PRN
  Administered 2022-09-15: 6 mL via INTRAVENOUS

## 2022-09-18 NOTE — Assessment & Plan Note (Addendum)
-  cT3N0M, stage IIIA    -diagnosed in 02/2021, liver biopsy 03/08/21 confirmed moderately differentiated hepatocellular carcinoma. No nodal or distant metastasis on CT 03/05/21.  --He is not a candidate for liver resection or transplant. Dr. [name] at [facility] did not recommend surgery given the location and extent of disease. -He started atezolizumab and bevacizumab on 04/28/21, held after 09/2021 due to newly diagnosed type 1 DM with hyperglycemia  --he underwent segment 8 Y90 on 07/22/21 and 06/15/2022  -he had good response to therapies  -Reviewed his restaging CT 09/15/2022 showed stable treated liver lesion, and a enlarging liver lesion in segment VIII (from 46m to 812m LI-RADS category 4)

## 2022-09-19 ENCOUNTER — Inpatient Hospital Stay (HOSPITAL_BASED_OUTPATIENT_CLINIC_OR_DEPARTMENT_OTHER): Payer: 59 | Admitting: Hematology

## 2022-09-19 ENCOUNTER — Other Ambulatory Visit: Payer: Self-pay

## 2022-09-19 ENCOUNTER — Encounter: Payer: Self-pay | Admitting: Hematology

## 2022-09-19 ENCOUNTER — Inpatient Hospital Stay: Payer: 59 | Attending: Hematology

## 2022-09-19 VITALS — BP 165/87 | HR 72 | Temp 98.3°F | Resp 19 | Wt 133.9 lb

## 2022-09-19 DIAGNOSIS — K5730 Diverticulosis of large intestine without perforation or abscess without bleeding: Secondary | ICD-10-CM | POA: Insufficient documentation

## 2022-09-19 DIAGNOSIS — N200 Calculus of kidney: Secondary | ICD-10-CM | POA: Diagnosis not present

## 2022-09-19 DIAGNOSIS — Z794 Long term (current) use of insulin: Secondary | ICD-10-CM | POA: Insufficient documentation

## 2022-09-19 DIAGNOSIS — E1065 Type 1 diabetes mellitus with hyperglycemia: Secondary | ICD-10-CM | POA: Diagnosis not present

## 2022-09-19 DIAGNOSIS — I700 Atherosclerosis of aorta: Secondary | ICD-10-CM | POA: Insufficient documentation

## 2022-09-19 DIAGNOSIS — C220 Liver cell carcinoma: Secondary | ICD-10-CM | POA: Diagnosis present

## 2022-09-19 LAB — CBC WITH DIFFERENTIAL (CANCER CENTER ONLY)
Abs Immature Granulocytes: 0.02 10*3/uL (ref 0.00–0.07)
Basophils Absolute: 0 10*3/uL (ref 0.0–0.1)
Basophils Relative: 0 %
Eosinophils Absolute: 0 10*3/uL (ref 0.0–0.5)
Eosinophils Relative: 1 %
HCT: 37.4 % — ABNORMAL LOW (ref 39.0–52.0)
Hemoglobin: 13 g/dL (ref 13.0–17.0)
Immature Granulocytes: 1 %
Lymphocytes Relative: 17 %
Lymphs Abs: 0.6 10*3/uL — ABNORMAL LOW (ref 0.7–4.0)
MCH: 31.6 pg (ref 26.0–34.0)
MCHC: 34.8 g/dL (ref 30.0–36.0)
MCV: 90.8 fL (ref 80.0–100.0)
Monocytes Absolute: 0.4 10*3/uL (ref 0.1–1.0)
Monocytes Relative: 13 %
Neutro Abs: 2.3 10*3/uL (ref 1.7–7.7)
Neutrophils Relative %: 68 %
Platelet Count: 85 10*3/uL — ABNORMAL LOW (ref 150–400)
RBC: 4.12 MIL/uL — ABNORMAL LOW (ref 4.22–5.81)
RDW: 12.4 % (ref 11.5–15.5)
WBC Count: 3.4 10*3/uL — ABNORMAL LOW (ref 4.0–10.5)
nRBC: 0 % (ref 0.0–0.2)

## 2022-09-19 LAB — CMP (CANCER CENTER ONLY)
ALT: 27 U/L (ref 0–44)
AST: 37 U/L (ref 15–41)
Albumin: 3.9 g/dL (ref 3.5–5.0)
Alkaline Phosphatase: 58 U/L (ref 38–126)
Anion gap: 5 (ref 5–15)
BUN: 13 mg/dL (ref 8–23)
CO2: 27 mmol/L (ref 22–32)
Calcium: 9.8 mg/dL (ref 8.9–10.3)
Chloride: 100 mmol/L (ref 98–111)
Creatinine: 0.74 mg/dL (ref 0.61–1.24)
GFR, Estimated: >60 mL/min (ref >60)
Glucose, Bld: 354 mg/dL — ABNORMAL HIGH (ref 70–99)
Potassium: 4.5 mmol/L (ref 3.5–5.1)
Sodium: 132 mmol/L — ABNORMAL LOW (ref 135–145)
Total Bilirubin: 0.9 mg/dL (ref 0.3–1.2)
Total Protein: 7.8 g/dL (ref 6.5–8.1)

## 2022-09-19 NOTE — Progress Notes (Signed)
[place]   [phone] [phone]   Clinic Follow up Note   Patient Care Team: Antonietta Jewel, MD as PCP - General (Internal Medicine) [name], MD as Consulting Physician (Hematology and Oncology) [name], RN (Inactive) as Oncology Nurse Navigator  Date of Service:  09/19/2022  CHIEF COMPLAINT: f/u of multifocal [place]   CURRENT THERAPY:  Surveillance -s/p Y90 on 07/22/21 and 06/15/22  ASSESSMENT:  Shawn Bailey is a 76 y.o. 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 No nodal or distant metastasis on CT 03/05/21.  --He is not a candidate for liver resection or transplant. Dr. [name] at [facility] did not recommend surgery given the location and extent of disease. -He started atezolizumab and bevacizumab on 04/28/21, held after 09/2021 due to newly diagnosed type 1 DM with hyperglycemia  --he underwent segment 8 Y90 on 07/22/21 and 06/15/2022  -he had good response to therapies  -Reviewed his restaging CT 09/15/2022 showed stable treated liver lesion, and a enlarging liver lesion in segment VIII (from 76m to 882m LI-RADS category 4) -He is clinically doing well, except intermittent ride mid abdomen pain which lasts for a few minutes, [facility]  PLAN: -Reviewed MRI Scan, I will let Dr. McLaurence [place] it also, will decide if repeating MRI in 3 or 6 months  -Tumor Marker pending -Lab f/u in 3 mths  SUMMARY OF ONCOLOGIC HISTORY: Oncology History Overview Note  Cancer Staging [facility] ([facility] form: Liver, AJCC 8th Edition - Clinical stage from 03/08/2021: Stage IIIA (cT3, cN0, cM0) - Signed by FeTruitt MerleMD on 03/11/2021 Stage prefix: Initial diagnosis Histologic grade (G): G2 Histologic grading system: 4 grade system     Hepatocellular carcinoma ([facility]  Initial  Diagnosis   Liver mass, right lobe   03/05/2021 Imaging   CT CAP  IMPRESSION: 1. Right hepatic lobe masses which are suspicious for either metastatic disease or primary malignancy such as hepatocellular carcinoma. These are suboptimally evaluated on this noncontrast exam. Consider multidisciplinary oncology consultation with potential clinical strategies of tissue sampling versus PET to direct sampling. 2. No primary malignancy identified within the chest, abdomen, or pelvis, given limitation of noncontrast exam. 3. Left nephrolithiasis. 4. Coronary artery atherosclerosis. Aortic Atherosclerosis (ICD10-I70.0). 5. Esophageal air fluid level suggests dysmotility or gastroesophageal reflux.   03/06/2021 Imaging   MRI Liver  IMPRESSION: 9.5 cm heterogeneous hypovascular mass in anterior right hepatic lobe, and 1.3 cm hypervascular mass in the posterior right hepatic lobe. Differential diagnosis includes liver metastases and multifocal hepatocellular carcinoma. Suggest correlation with serum AFP level, and possible tissue sampling.   No evidence of abdominal metastatic disease.   03/08/2021 Initial Biopsy   A. LIVER, RIGHT, BIOPSY:  - Hepatocellular carcinoma.   COMMENT:  The hepatocellular carcinoma is moderately differentiated and associated with areas of necrosis.  Immunohistochemistry shows positivity with glypican-3 and weak staining with HepPar 1.  The tumor is negative with arginase 1, alpha-fetoprotein, CDX2, cytokeratin 7, cytokeratin 20, cytokeratin AE1/AE3, PSA, prostein and MOC31.  Polyclonal CEA shows focal canalicular pattern.  The morphology and immunophenotype are consistent with hepatocellular carcinoma.    03/08/2021 Cancer Staging   Staging form: Liver, AJCC 8th Edition - Clinical stage from 03/08/2021: Stage IIIA (cT3, cN0, cM0) - Signed by FeTruitt MerleMD on 03/11/2021 Stage prefix: Initial diagnosis Histologic grade (G): G2 Histologic grading system: 4  grade  system   04/28/2021 - 10/13/2021 Chemotherapy   Patient is on Treatment Plan : LUNG Atezolizumab + Bevacizumab q21d Maintenance     06/16/2021 Imaging   CT Abdomen  IMPRESSION: Marked interval decrease in size of the large RIGHT hepatic lobe lesion, biopsy-proven hepatocellular carcinoma. No arterial phase enhancement on early phase, vague enhancement on later phase greater than 20 Hounsfield unit enhancement on venous phase. Less pronounced arterial enhancement than on previous imaging with small peripheral foci of enhancement on arterial phase particularly along the margin of the tumor near the dome of the liver (image 12/2) 11 mm focus of enhancement in this area.   Interval response to therapy of a small lesion previously compatible with LI-RADS category 5 lesion in the posterior RIGHT hepatic lobe.   No new hepatic lesion.   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   Signs of nephrolithiasis calculus on the LEFT measuring approximately 7 mm.   Colonic diverticulosis without evidence of acute diverticulitis.   Aortic Atherosclerosis (ICD10-I70.0).   11/02/2021 Imaging   EXAM: CT ABDOMEN AND PELVIS WITH CONTRAST   IMPRESSION: 1. Right hepatic mass has minimally decreased in size. No new focal hepatic masses are seen. 2. New small amount of ascites in the right upper quadrant. 3. Wall thickening of the cecum and ascending colon versus normal under distension. Correlate for nonspecific colitis. 4. Nonobstructing left renal calculus. 5.  Aortic Atherosclerosis (ICD10-I70.0).   09/15/2022 Imaging   IMPRESSION: 1. No significant change in post treatment appearance of hepatic segment VIII, with a well circumscribed, intrinsically T1 hyperintense lesion of the liver dome as well as additional heterogeneous, intrinsic T1 hyperintensity just superiorly. LI-RADS TR, nonviable. 2. Following radioembolization, new background parenchymal hyperenhancement of  hepatic segment VII, with faint rim enhancing, although internally nonenhancing lesions in the posterior liver dome corresponding to previously arterially hyperenhancing liver lesions. This is consistent with post treatment hyperemia without evidence of residual viable tumor. LI-RADS TR, nonviable. 3. Interval enlargement of an arterially hyperenhancing lesion in the anterior right lobe of the liver, near the margin of hepatic segment VIII, measuring 0.8 x 0.8 cm, previously no greater than 0.4 cm. This does not demonstrate clear evidence of washout or capsular enhancement. LI-RADS category 4 due to threshold growth.      Imaging     09/15/2022 Imaging    IMPRESSION: 1. No significant change in post treatment appearance of hepatic segment VIII, with a well circumscribed, intrinsically T1 hyperintense lesion of the liver dome as well as additional heterogeneous, intrinsic T1 hyperintensity just superiorly. LI-RADS TR, nonviable. 2. Following radioembolization, new background parenchymal hyperenhancement of hepatic segment VII, with faint rim enhancing, although internally nonenhancing lesions in the posterior liver dome corresponding to previously arterially hyperenhancing liver lesions. This is consistent with post treatment hyperemia without evidence of residual viable tumor. LI-RADS TR, nonviable. 3. Interval enlargement of an arterially hyperenhancing lesion in the anterior right lobe of the liver, near the margin of hepatic segment VIII, measuring 0.8 x 0.8 cm, previously no greater than 0.4 cm. This does not demonstrate clear evidence of washout or capsular enhancement. LI-RADS category 4 due to threshold growth.        INTERVAL HISTORY:  Shawn Bailey is here for a follow up of multifocal [place]  He was last seen by me on 06/27/2022 He presents to the clinic accompanied by family member. Pt report of having pain in lower right stomach.Dull pain 2x a week the pain come.  Pt deny's having constipation.Pt reports pain went away after having BM. Marland Kitchen  All other systems were reviewed with the patient and are negative.  MEDICAL HISTORY:  Past Medical History:  Diagnosis Date   Depression    Hepatocellular carcinoma ([place]) 03/08/2021   Parasite infestation     SURGICAL HISTORY: Past Surgical History:  Procedure Laterality Date   IR 3D INDEPENDENT WKST  05/25/2022   IR ANGIOGRAM SELECTIVE EACH ADDITIONAL VESSEL  07/01/2021   IR ANGIOGRAM SELECTIVE EACH ADDITIONAL VESSEL  07/22/2021   IR ANGIOGRAM SELECTIVE EACH ADDITIONAL VESSEL  07/22/2021   IR ANGIOGRAM SELECTIVE EACH ADDITIONAL VESSEL  07/22/2021   IR ANGIOGRAM SELECTIVE EACH ADDITIONAL VESSEL  07/22/2021   IR ANGIOGRAM SELECTIVE EACH ADDITIONAL VESSEL  05/25/2022   IR ANGIOGRAM SELECTIVE EACH ADDITIONAL VESSEL  05/25/2022   IR ANGIOGRAM SELECTIVE EACH ADDITIONAL VESSEL  05/25/2022   IR ANGIOGRAM SELECTIVE EACH ADDITIONAL VESSEL  05/25/2022   IR ANGIOGRAM SELECTIVE EACH ADDITIONAL VESSEL  05/25/2022   IR ANGIOGRAM SELECTIVE EACH ADDITIONAL VESSEL  05/25/2022   IR ANGIOGRAM SELECTIVE EACH ADDITIONAL VESSEL  06/15/2022   IR ANGIOGRAM SELECTIVE EACH ADDITIONAL VESSEL  06/15/2022   IR ANGIOGRAM SELECTIVE EACH ADDITIONAL VESSEL  06/15/2022   IR ANGIOGRAM VISCERAL SELECTIVE  07/01/2021   IR ANGIOGRAM VISCERAL SELECTIVE  07/22/2021   IR ANGIOGRAM VISCERAL SELECTIVE  05/25/2022   IR ANGIOGRAM VISCERAL SELECTIVE  06/15/2022   IR EMBO ARTERIAL NOT HEMORR HEMANG INC GUIDE ROADMAPPING  07/01/2021   IR EMBO TUMOR ORGAN ISCHEMIA INFARCT INC GUIDE ROADMAPPING  07/22/2021   IR EMBO TUMOR ORGAN ISCHEMIA INFARCT INC GUIDE ROADMAPPING  06/15/2022   IR RADIOLOGIST EVAL & MGMT  06/01/2021   IR RADIOLOGIST EVAL & MGMT  04/26/2022   IR RADIOLOGIST EVAL & MGMT  07/12/2022   IR US GUIDE VASC ACCESS RIGHT  07/01/2021   IR US GUIDE VASC ACCESS RIGHT  07/22/2021   IR US GUIDE VASC ACCESS RIGHT  05/25/2022   IR US GUIDE VASC ACCESS RIGHT  06/15/2022   NO PAST  SURGERIES      I have reviewed the social history and family history with the patient and they are unchanged from previous note.  ALLERGIES:  has No Known Allergies.  MEDICATIONS:  Current Outpatient Medications  Medication Sig Dispense Refill   Ascorbic Acid (VITAMIN C) 100 MG tablet Take 100 mg by mouth daily.     blood glucose meter kit and supplies Dispense based on patient and insurance preference. Use up to four times daily as directed. (FOR ICD-10 E10.9, E11.9). 1 each 0   HYDROcodone-acetaminophen (NORCO) 5-325 MG tablet Take 1 tablet by mouth every 12 (twelve) hours as needed for moderate pain. 45 tablet 0   insulin glargine (LANTUS) 100 UNIT/ML injection 40 Units daily.     Insulin Pen Needle (PEN NEEDLES 3/16") 31G X 5 MM MISC 1 each by Does not apply route with breakfast, with lunch, and with evening meal. 100 each 3   metFORMIN (GLUCOPHAGE) 500 MG tablet Take 1 tablet (500 mg total) by mouth 2 (two) times daily with a meal. 60 tablet 3   Multiple Vitamin (MULTIVITAMIN WITH MINERALS) TABS tablet Take 1 tablet by mouth daily.     ondansetron (ZOFRAN) 8 MG tablet Take 1 tablet (8 mg total) by mouth every 8 (eight) hours as needed for nausea or vomiting. 30 tablet 0   polyethylene glycol (MIRALAX / GLYCOLAX) 17 g packet Take 17 g by mouth daily as needed for mild  constipation. 14 each 0   prochlorperazine (COMPAZINE) 10 MG tablet Take 1 tablet (10 mg total) by mouth every 6 (six) hours as needed for nausea or vomiting. 30 tablet 0   senna (SENOKOT) 8.6 MG TABS tablet Take 1 tablet (8.6 mg total) by mouth 2 (two) times daily. (Patient taking differently: Take 1 tablet by mouth 2 (two) times daily as needed for moderate constipation.) 120 tablet 0   vitamin B-12 (CYANOCOBALAMIN) 100 MCG tablet Take 100 mcg by mouth daily.     No current facility-administered medications for this visit.   Facility-Administered Medications Ordered in Other Visits  Medication Dose Route Frequency  Provider Last Rate Last Admin   0.9 %  sodium chloride infusion   Intravenous Continuous [name], MD        PHYSICAL EXAMINATION: ECOG PERFORMANCE STATUS: 1 - Symptomatic but completely ambulatory  Vitals:   09/19/22 1104  BP: (!) 165/87  Pulse: 72  Resp: 19  Temp: 98.3 F (36.8 C)  SpO2: 100%   Wt Readings from Last 3 Encounters:  09/19/22 133 lb 14.4 oz (60.7 kg)  06/27/22 132 lb (59.9 kg)  06/15/22 132 lb 4.4 oz (60 kg)     GENERAL:alert, no distress and comfortable SKIN: skin color, texture, turgor are normal, no rashes or significant lesions EYES: normal, Conjunctiva are pink and non-injected, sclera clear NECK: supple, thyroid normal size, non-tender, without nodularity LYMPH:  no palpable lymphadenopathy in the cervical, axillary  LUNGS: clear to auscultation and percussion with normal breathing effort HEART: regular rate & rhythm and no murmurs and no lower extremity edema ABDOMEN:abdomen soft, non-tender and normal bowel sounds.Liver is slightly enlarge. Musculoskeletal:no cyanosis of digits and no clubbing  NEURO: alert & oriented x 3 with fluent speech, no focal motor/sensory deficits  LABORATORY DATA:  I have reviewed the data as listed    Latest Ref Rng & Units 09/19/2022   10:49 AM 06/27/2022   11:03 AM 06/15/2022    8:11 AM  CBC  WBC 4.0 - 10.5 K/uL 3.4  3.6  3.7   Hemoglobin 13.0 - 17.0 g/dL 13.0  12.6  12.7   Hematocrit 39.0 - 52.0 % 37.4  35.9  37.4   Platelets 150 - 400 K/uL 85  95  94         Latest Ref Rng & Units 09/19/2022   10:49 AM 06/27/2022   11:03 AM 06/15/2022    8:11 AM  CMP  Glucose 70 - 99 mg/dL 354  358  137   BUN 8 - 23 mg/dL _0 Creatinine 0.61 - 1.24 mg/dL 0.74  0.81  0.84   Sodium 135 - 145 mmol/L 132  132  136   Potassium 3.5 - 5.1 mmol/L 4.5  4.3  4.5   Chloride 98 - 111 mmol/L 100  98  104   CO2 22 - 32 mmol/L _1 Calcium 8.9 - 10.3 mg/dL 9.8  9.8  9.5   Total Protein 6.5 - 8.1 g/dL 7.8  7.6  7.9    Total Bilirubin 0.3 - 1.2 mg/dL 0.9  0.6  0.7   Alkaline Phos 38 - 126 U/L 58  57  49   AST 15 - 41 U/L 37  28  40   ALT 0 - 44 U/L _2 RADIOGRAPHIC STUDIES: I have personally reviewed the radiological images as listed and agreed with  the findings in the report. No results found.    No orders of the defined types were placed in this encounter.  All questions were answered. The patient knows to call the clinic with any problems, questions or concerns. No barriers to learning was detected. The total time spent in the appointment was 30 minutes.     [name], MD 09/19/2022   Felicity Coyer, CMA, am acting as scribe for [name], MD.   I have reviewed the above documentation for accuracy and completeness, and I agree with the above.

## 2022-09-21 LAB — AFP TUMOR MARKER: AFP, Serum, Tumor Marker: 4.6 ng/mL (ref 0.0–8.4)

## 2022-12-14 ENCOUNTER — Other Ambulatory Visit: Payer: Self-pay

## 2022-12-14 DIAGNOSIS — C220 Liver cell carcinoma: Secondary | ICD-10-CM

## 2022-12-14 DIAGNOSIS — R160 Hepatomegaly, not elsewhere classified: Secondary | ICD-10-CM

## 2022-12-15 ENCOUNTER — Other Ambulatory Visit: Payer: Self-pay

## 2022-12-18 ENCOUNTER — Ambulatory Visit (HOSPITAL_COMMUNITY)
Admission: RE | Admit: 2022-12-18 | Discharge: 2022-12-18 | Disposition: A | Discharge status: Home / Self Care | Payer: 59 | Source: Ambulatory Visit | Attending: Hematology | Admitting: Hematology

## 2022-12-18 DIAGNOSIS — R160 Hepatomegaly, not elsewhere classified: Secondary | ICD-10-CM | POA: Diagnosis present

## 2022-12-18 DIAGNOSIS — C220 Liver cell carcinoma: Secondary | ICD-10-CM | POA: Diagnosis not present

## 2022-12-18 MED ORDER — GADOBUTROL 1 MMOL/ML IV SOLN
6.0000 mL | Freq: Once | INTRAVENOUS | Status: AC | PRN
  Administered 2022-12-18: 6 mL via INTRAVENOUS

## 2022-12-18 NOTE — Assessment & Plan Note (Addendum)
-  cT3N0M, stage IIIA    -diagnosed in 02/2021, liver biopsy 03/08/21 confirmed moderately differentiated hepatocellular carcinoma. No nodal or distant metastasis on CT 03/05/21.  --He is not a candidate for liver resection or transplant. Dr. [name] at [facility] did not recommend surgery given the location and extent of disease. -He started atezolizumab and bevacizumab on 04/28/21, held after 09/2021 due to newly diagnosed type 1 DM with hyperglycemia  --he underwent segment 8 Y90 on 07/22/21 and 06/15/2022  -he had good response to therapies  -Reviewed his restaging CT 09/15/2022 showed stable treated liver lesion, and a enlarging liver lesion in segment VIII (from 43m to 824m LI-RADS category 4) -Continue disease monitoring when off treatment.

## 2022-12-19 ENCOUNTER — Encounter: Payer: Self-pay | Admitting: Hematology

## 2022-12-19 ENCOUNTER — Inpatient Hospital Stay (HOSPITAL_BASED_OUTPATIENT_CLINIC_OR_DEPARTMENT_OTHER): Payer: 59 | Admitting: Hematology

## 2022-12-19 ENCOUNTER — Inpatient Hospital Stay: Payer: 59 | Attending: Hematology

## 2022-12-19 ENCOUNTER — Other Ambulatory Visit: Payer: Self-pay

## 2022-12-19 VITALS — BP 164/91 | HR 76 | Temp 98.3°F | Resp 18 | Ht 65.0 in | Wt 126.9 lb

## 2022-12-19 DIAGNOSIS — E1065 Type 1 diabetes mellitus with hyperglycemia: Secondary | ICD-10-CM | POA: Insufficient documentation

## 2022-12-19 DIAGNOSIS — Z79899 Other long term (current) drug therapy: Secondary | ICD-10-CM | POA: Diagnosis not present

## 2022-12-19 DIAGNOSIS — C220 Liver cell carcinoma: Secondary | ICD-10-CM | POA: Insufficient documentation

## 2022-12-19 DIAGNOSIS — B1920 Unspecified viral hepatitis C without hepatic coma: Secondary | ICD-10-CM | POA: Diagnosis not present

## 2022-12-19 DIAGNOSIS — Z7984 Long term (current) use of oral hypoglycemic drugs: Secondary | ICD-10-CM | POA: Diagnosis not present

## 2022-12-19 DIAGNOSIS — I700 Atherosclerosis of aorta: Secondary | ICD-10-CM | POA: Diagnosis not present

## 2022-12-19 DIAGNOSIS — I2510 Atherosclerotic heart disease of native coronary artery without angina pectoris: Secondary | ICD-10-CM | POA: Diagnosis not present

## 2022-12-19 DIAGNOSIS — K7460 Unspecified cirrhosis of liver: Secondary | ICD-10-CM | POA: Insufficient documentation

## 2022-12-19 DIAGNOSIS — N200 Calculus of kidney: Secondary | ICD-10-CM | POA: Diagnosis not present

## 2022-12-19 LAB — CBC WITH DIFFERENTIAL (CANCER CENTER ONLY)
Abs Immature Granulocytes: 0.02 10*3/uL (ref 0.00–0.07)
Basophils Absolute: 0 10*3/uL (ref 0.0–0.1)
Basophils Relative: 1 %
Eosinophils Absolute: 0.1 10*3/uL (ref 0.0–0.5)
Eosinophils Relative: 2 %
HCT: 36.8 % — ABNORMAL LOW (ref 39.0–52.0)
Hemoglobin: 13.2 g/dL (ref 13.0–17.0)
Immature Granulocytes: 1 %
Lymphocytes Relative: 16 %
Lymphs Abs: 0.7 10*3/uL (ref 0.7–4.0)
MCH: 31.7 pg (ref 26.0–34.0)
MCHC: 35.9 g/dL (ref 30.0–36.0)
MCV: 88.2 fL (ref 80.0–100.0)
Monocytes Absolute: 0.5 10*3/uL (ref 0.1–1.0)
Monocytes Relative: 11 %
Neutro Abs: 3 10*3/uL (ref 1.7–7.7)
Neutrophils Relative %: 69 %
Platelet Count: 102 10*3/uL — ABNORMAL LOW (ref 150–400)
RBC: 4.17 MIL/uL — ABNORMAL LOW (ref 4.22–5.81)
RDW: 12.3 % (ref 11.5–15.5)
WBC Count: 4.3 10*3/uL (ref 4.0–10.5)
nRBC: 0 % (ref 0.0–0.2)

## 2022-12-19 LAB — CMP (CANCER CENTER ONLY)
ALT: 26 U/L (ref 0–44)
AST: 31 U/L (ref 15–41)
Albumin: 3.9 g/dL (ref 3.5–5.0)
Alkaline Phosphatase: 61 U/L (ref 38–126)
Anion gap: 4 — ABNORMAL LOW (ref 5–15)
BUN: 16 mg/dL (ref 8–23)
CO2: 28 mmol/L (ref 22–32)
Calcium: 9.4 mg/dL (ref 8.9–10.3)
Chloride: 100 mmol/L (ref 98–111)
Creatinine: 0.8 mg/dL (ref 0.61–1.24)
GFR, Estimated: >60 mL/min (ref >60)
Glucose, Bld: 165 mg/dL — ABNORMAL HIGH (ref 70–99)
Potassium: 4.2 mmol/L (ref 3.5–5.1)
Sodium: 132 mmol/L — ABNORMAL LOW (ref 135–145)
Total Bilirubin: 0.7 mg/dL (ref 0.3–1.2)
Total Protein: 7.5 g/dL (ref 6.5–8.1)

## 2022-12-19 NOTE — Progress Notes (Signed)
[place]   [phone] [phone]   Clinic Follow up Note   Patient Care Team: Antonietta Jewel, MD as PCP - General (Internal Medicine) [name], MD as Consulting Physician (Hematology and Oncology) [name], RN (Inactive) as Oncology Nurse Navigator  Date of Service:  12/19/2022  CHIEF COMPLAINT: f/u of multifocal [place]    CURRENT THERAPY:  Surveillance    ASSESSMENT:  Carmin Malen is a 77 y.o. 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 No nodal or distant metastasis on CT 03/05/21.  --He is not a candidate for liver resection or transplant. Dr. [name] at [facility] did not recommend surgery given the location and extent of disease. -He started atezolizumab and bevacizumab on 04/28/21, held after 09/2021 due to newly diagnosed type 1 DM with hyperglycemia which was probably related to immunotherapy. --he underwent segment 8 Y90 on 07/22/21 and 06/15/2022  -he had good response to therapies  -Reviewed his restaging CT 09/15/2022 showed stable treated liver lesion, and a enlarging liver lesion in segment VIII (from 70m to 843m LI-RADS category 4) -I personally reviewed his restaging MRI from last week, which showed a stable treated liver lesion, previously LI-RADS [name] category 4 lesion in right lobe of liver is overall stable in size, no definitive washout and enhancing capsule, it is considered LI RADS 3 now.  No additional new lesion. -He is clinically doing well, except mild intermittent postprandial gastric pain.  I recommend him to try Pepcid or Prilosec, and will refer him to GI. -Continue disease monitoring when off treatment.  2. Hep C, untreated, liver cirrhosis  -This was discovered when he was found to have HCTucumcari-He does not have a GI doctor, I discussed the benefit of screening EGD for varices.  He  never had a colonoscopy either. -Will refer him to GI Dr. HuBenson [place] 3. Type 1 DM -on insulin, f/u with PCP   PLAN: -discuss MR scan- stable -Discuss reason for seeing a GI -referral to GI Dr. HuBenson [place]-continue to monitor -Discuss OTC medication for stomach -repeat scan in 6 months -lab and f/u in 3 months    SUMMARY OF ONCOLOGIC HISTORY: Oncology History Overview Note  Cancer Staging [facility]: Liver, AJCC 8th Edition - Clinical stage from 03/08/2021: Stage IIIA (cT3, cN0, cM0) - Signed by FeTruitt MerleMD on 03/11/2021 Stage prefix: Initial diagnosis Histologic grade (G): G2 Histologic grading system: 4 grade system     Hepatocellular carcinoma (HCOccidental  Initial Diagnosis   Liver mass, right lobe   03/05/2021 Imaging   CT CAP  IMPRESSION: 1. Right hepatic lobe masses which are suspicious for either metastatic disease or primary malignancy such as hepatocellular carcinoma. These are suboptimally evaluated on this noncontrast exam. Consider multidisciplinary oncology consultation with potential clinical strategies of tissue sampling versus PET to direct sampling. 2. No primary malignancy identified within the chest, abdomen, or pelvis, given limitation of noncontrast exam. 3. Left nephrolithiasis. 4. Coronary artery atherosclerosis. Aortic Atherosclerosis (ICD10-I70.0). 5. Esophageal air fluid level suggests dysmotility or gastroesophageal reflux.   03/06/2021 Imaging   MRI Liver  IMPRESSION: 9.5 cm heterogeneous hypovascular mass in anterior right hepatic lobe, and 1.3 cm hypervascular mass in the posterior right hepatic lobe. Differential diagnosis includes liver metastases and multifocal hepatocellular carcinoma. Suggest correlation with serum AFP level, and possible tissue sampling.   No evidence of  abdominal metastatic disease.   03/08/2021 Initial Biopsy   A. LIVER, RIGHT, BIOPSY:  - Hepatocellular carcinoma.   COMMENT:  The  hepatocellular carcinoma is moderately differentiated and associated with areas of necrosis.  Immunohistochemistry shows positivity with glypican-3 and weak staining with HepPar 1.  The tumor is negative with arginase 1, alpha-fetoprotein, CDX2, cytokeratin 7, cytokeratin 20, cytokeratin AE1/AE3, PSA, prostein and MOC31.  Polyclonal CEA shows focal canalicular pattern.  The morphology and immunophenotype are consistent with hepatocellular carcinoma.    03/08/2021 Cancer Staging   Staging form: Liver, AJCC 8th Edition - Clinical stage from 03/08/2021: Stage IIIA (cT3, cN0, cM0) - Signed by [name], MD on 03/11/2021 Stage prefix: Initial diagnosis Histologic grade (G): G2 Histologic grading system: 4 grade system   04/28/2021 - 10/13/2021 Chemotherapy   Patient is on Treatment Plan : LUNG Atezolizumab + Bevacizumab q21d Maintenance     06/16/2021 Imaging   CT Abdomen  IMPRESSION: Marked interval decrease in size of the large RIGHT hepatic lobe lesion, biopsy-proven hepatocellular carcinoma. No arterial phase enhancement on early phase, vague enhancement on later phase greater than 20 Hounsfield unit enhancement on venous phase. Less pronounced arterial enhancement than on previous imaging with small peripheral foci of enhancement on arterial phase particularly along the margin of the tumor near the dome of the liver (image 12/2) 11 mm focus of enhancement in this area.   Interval response to therapy of a small lesion previously compatible with LI-RADS category 5 lesion in the posterior RIGHT hepatic lobe.   No new hepatic lesion.   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   Signs of nephrolithiasis calculus on the LEFT measuring approximately 7 mm.   Colonic diverticulosis without evidence of acute diverticulitis.   Aortic Atherosclerosis (ICD10-I70.0).   11/02/2021 Imaging   EXAM: CT ABDOMEN AND PELVIS WITH CONTRAST   IMPRESSION: 1. Right hepatic mass has  minimally decreased in size. No new focal hepatic masses are seen. 2. New small amount of ascites in the right upper quadrant. 3. Wall thickening of the cecum and ascending colon versus normal under distension. Correlate for nonspecific colitis. 4. Nonobstructing left renal calculus. 5.  Aortic Atherosclerosis (ICD10-I70.0).   09/15/2022 Imaging   IMPRESSION: 1. No significant change in post treatment appearance of hepatic segment VIII, with a well circumscribed, intrinsically T1 hyperintense lesion of the liver dome as well as additional heterogeneous, intrinsic T1 hyperintensity just superiorly. LI-RADS TR, nonviable. 2. Following radioembolization, new background parenchymal hyperenhancement of hepatic segment VII, with faint rim enhancing, although internally nonenhancing lesions in the posterior liver dome corresponding to previously arterially hyperenhancing liver lesions. This is consistent with post treatment hyperemia without evidence of residual viable tumor. LI-RADS TR, nonviable. 3. Interval enlargement of an arterially hyperenhancing lesion in the anterior right lobe of the liver, near the margin of hepatic segment VIII, measuring 0.8 x 0.8 cm, previously no greater than 0.4 cm. This does not demonstrate clear evidence of washout or capsular enhancement. LI-RADS category 4 due to threshold growth.      Imaging     09/15/2022 Imaging    IMPRESSION: 1. No significant change in post treatment appearance of hepatic segment VIII, with a well circumscribed, intrinsically T1 hyperintense lesion of the liver dome as well as additional heterogeneous, intrinsic T1 hyperintensity just superiorly. LI-RADS TR, nonviable. 2. Following radioembolization, new background parenchymal hyperenhancement of hepatic segment VII, with faint rim enhancing, although internally nonenhancing lesions in the posterior liver dome corresponding to previously arterially  hyperenhancing liver  lesions. This is consistent with post treatment hyperemia without evidence of residual viable tumor. LI-RADS TR, nonviable. 3. Interval enlargement of an arterially hyperenhancing lesion in the anterior right lobe of the liver, near the margin of hepatic segment VIII, measuring 0.8 x 0.8 cm, previously no greater than 0.4 cm. This does not demonstrate clear evidence of washout or capsular enhancement. LI-RADS category 4 due to threshold growth.     12/18/2022 Imaging    IMPRESSION: 1. Stable appearance of the previously treated segment VIII lesion. LI-RADS TR, nonviable. 2. Stable appearance of the index lesions in the posterior right hepatic dome. As before, there is early heterogeneous but relatively diffuse enhancement in the posterior right hepatic dome, likely treatment related. Index lesions documented previously are similar in size and enhancement characteristics without suspicious enhancement characteristics. LI-RADS TR, nonviable. 3. Small lesion showing arterial phase hyperenhancement in the anterior right liver (segment II right) persists. As before there is no definite washout and no enhancing capsule associated with this lesion. Given lack of interval growth, this lesion is downgraded to LI-RADS 3. Close continued follow-up recommended. 4. No new or progressive findings in the abdomen.        INTERVAL HISTORY:  Rajeev Wallett is here for a follow up of multifocal Shel[place]   He was last seen by me on 09/19/2022 He presents to the clinic accompanied by family. Pt state he has been having abdominal pain after he eats. Pt he has the pain 3 time a week. Pt has not taken anything for stomach pain.   All other systems were reviewed with the patient and are negative.  MEDICAL HISTORY:  Past Medical History:  Diagnosis Date   Depression    Hepatocellular carcinoma ([place]) 03/08/2021   Parasite infestation     SURGICAL HISTORY: Past Surgical History:  Procedure  Laterality Date   IR 3D INDEPENDENT WKST  05/25/2022   IR ANGIOGRAM SELECTIVE EACH ADDITIONAL VESSEL  07/01/2021   IR ANGIOGRAM SELECTIVE EACH ADDITIONAL VESSEL  07/22/2021   IR ANGIOGRAM SELECTIVE EACH ADDITIONAL VESSEL  07/22/2021   IR ANGIOGRAM SELECTIVE EACH ADDITIONAL VESSEL  07/22/2021   IR ANGIOGRAM SELECTIVE EACH ADDITIONAL VESSEL  07/22/2021   IR ANGIOGRAM SELECTIVE EACH ADDITIONAL VESSEL  05/25/2022   IR ANGIOGRAM SELECTIVE EACH ADDITIONAL VESSEL  05/25/2022   IR ANGIOGRAM SELECTIVE EACH ADDITIONAL VESSEL  05/25/2022   IR ANGIOGRAM SELECTIVE EACH ADDITIONAL VESSEL  05/25/2022   IR ANGIOGRAM SELECTIVE EACH ADDITIONAL VESSEL  05/25/2022   IR ANGIOGRAM SELECTIVE EACH ADDITIONAL VESSEL  05/25/2022   IR ANGIOGRAM SELECTIVE EACH ADDITIONAL VESSEL  06/15/2022   IR ANGIOGRAM SELECTIVE EACH ADDITIONAL VESSEL  06/15/2022   IR ANGIOGRAM SELECTIVE EACH ADDITIONAL VESSEL  06/15/2022   IR ANGIOGRAM VISCERAL SELECTIVE  07/01/2021   IR ANGIOGRAM VISCERAL SELECTIVE  07/22/2021   IR ANGIOGRAM VISCERAL SELECTIVE  05/25/2022   IR ANGIOGRAM VISCERAL SELECTIVE  06/15/2022   IR EMBO ARTERIAL NOT HEMORR HEMANG INC GUIDE ROADMAPPING  07/01/2021   IR EMBO TUMOR ORGAN ISCHEMIA INFARCT INC GUIDE ROADMAPPING  07/22/2021   IR EMBO TUMOR ORGAN ISCHEMIA INFARCT INC GUIDE ROADMAPPING  06/15/2022   IR RADIOLOGIST EVAL & MGMT  06/01/2021   IR RADIOLOGIST EVAL & MGMT  04/26/2022   IR RADIOLOGIST EVAL & MGMT  07/12/2022   IR US GUIDE VASC ACCESS RIGHT  07/01/2021   IR US GUIDE VASC ACCESS RIGHT  07/22/2021   IR US GUIDE VASC ACCESS RIGHT  05/25/2022   IR US GUIDE  VASC ACCESS RIGHT  06/15/2022   NO PAST SURGERIES      I have reviewed the social history and family history with the patient and they are unchanged from previous note.  ALLERGIES:  has No Known Allergies.  MEDICATIONS:  Current Outpatient Medications  Medication Sig Dispense Refill   Ascorbic Acid (VITAMIN C) 100 MG tablet Take 100 mg by mouth daily.     blood glucose meter kit  and supplies Dispense based on patient and insurance preference. Use up to four times daily as directed. (FOR ICD-10 E10.9, E11.9). 1 each 0   HYDROcodone-acetaminophen (NORCO) 5-325 MG tablet Take 1 tablet by mouth every 12 (twelve) hours as needed for moderate pain. 45 tablet 0   insulin glargine (LANTUS) 100 UNIT/ML injection 40 Units daily.     Insulin Pen Needle (PEN NEEDLES 3/16") 31G X 5 MM MISC 1 each by Does not apply route with breakfast, with lunch, and with evening meal. 100 each 3   metFORMIN (GLUCOPHAGE) 500 MG tablet Take 1 tablet (500 mg total) by mouth 2 (two) times daily with a meal. 60 tablet 3   Multiple Vitamin (MULTIVITAMIN WITH MINERALS) TABS tablet Take 1 tablet by mouth daily.     ondansetron (ZOFRAN) 8 MG tablet Take 1 tablet (8 mg total) by mouth every 8 (eight) hours as needed for nausea or vomiting. 30 tablet 0   polyethylene glycol (MIRALAX / GLYCOLAX) 17 g packet Take 17 g by mouth daily as needed for mild constipation. 14 each 0   prochlorperazine (COMPAZINE) 10 MG tablet Take 1 tablet (10 mg total) by mouth every 6 (six) hours as needed for nausea or vomiting. 30 tablet 0   senna (SENOKOT) 8.6 MG TABS tablet Take 1 tablet (8.6 mg total) by mouth 2 (two) times daily. (Patient taking differently: Take 1 tablet by mouth 2 (two) times daily as needed for moderate constipation.) 120 tablet 0   vitamin B-12 (CYANOCOBALAMIN) 100 MCG tablet Take 100 mcg by mouth daily.     No current facility-administered medications for this visit.   Facility-Administered Medications Ordered in Other Visits  Medication Dose Route Frequency Provider Last Rate Last Admin   0.9 %  sodium chloride infusion   Intravenous Continuous [name], MD        PHYSICAL EXAMINATION: ECOG PERFORMANCE STATUS: 1 - Symptomatic but completely ambulatory  Vitals:   12/19/22 1114  BP: (!) 164/91  Pulse: 76  Resp: 18  Temp: 98.3 F (36.8 C)  SpO2: 99%   Wt Readings from Last 3 Encounters:   12/19/22 126 lb 14.4 oz (57.6 kg)  09/19/22 133 lb 14.4 oz (60.7 kg)  06/27/22 132 lb (59.9 kg)     NECK:(-)  supple, thyroid normal size, non-tender, without nodularity LYMPH: (-)  no palpable lymphadenopathy in the cervical, axillary  LUNGS: clear to auscultation and percussion with normal breathing effort HEART: regular rate & rhythm and no murmurs and no lower extremity edema ABDOMEN:(-) abdomen soft, (-) non-tender and normal bowel sounds, Liver not enlarge.  LABORATORY DATA:  I have reviewed the data as listed    Latest Ref Rng & Units 12/19/2022   10:53 AM 09/19/2022   10:49 AM 06/27/2022   11:03 AM  CBC  WBC 4.0 - 10.5 K/uL 4.3  3.4  3.6   Hemoglobin 13.0 - 17.0 g/dL 13.2  13.0  12.6   Hematocrit 39.0 - 52.0 % 36.8  37.4  35.9   Platelets 150 - 400 K/uL 102  85  95         Latest Ref Rng & Units 09/19/2022   10:49 AM 06/27/2022   11:03 AM 06/15/2022    8:11 AM  CMP  Glucose 70 - 99 mg/dL 354  358  137   BUN 8 - 23 mg/dL '13  13  18   '$ Creatinine 0.61 - 1.24 mg/dL 0.74  0.81  0.84   Sodium 135 - 145 mmol/L 132  132  136   Potassium 3.5 - 5.1 mmol/L 4.5  4.3  4.5   Chloride 98 - 111 mmol/L 100  98  104   CO2 22 - 32 mmol/L '27  30  28   '$ Calcium 8.9 - 10.3 mg/dL 9.8  9.8  9.5   Total Protein 6.5 - 8.1 g/dL 7.8  7.6  7.9   Total Bilirubin 0.3 - 1.2 mg/dL 0.9  0.6  0.7   Alkaline Phos 38 - 126 U/L 58  57  49   AST 15 - 41 U/L 37  28  40   ALT 0 - 44 U/L '27  20  31       '$ RADIOGRAPHIC STUDIES: I have personally reviewed the radiological images as listed and agreed with the findings in the report. MR LIVER W WO CONTRAST  Result Date: 12/18/2022 CLINICAL DATA:  Hepatocellular carcinoma.  Restaging. EXAM: MRI ABDOMEN WITHOUT AND WITH CONTRAST TECHNIQUE: Multiplanar multisequence MR imaging of the abdomen was performed both before and after the administration of intravenous contrast. CONTRAST:  71m GADAVIST GADOBUTROL 1 MMOL/ML IV SOLN COMPARISON:  09/15/2022 FINDINGS: Lower  chest: Unremarkable. Hepatobiliary: No substantial change in appearance of previously treated segment VIII lesion demonstrating intrinsic T1 shortening (image 18/series 13). Just cranial to this is a second region of T1 increased signal, also stable. As on the previous study, there is early heterogeneous but relatively diffuse enhancement in the posterior right hepatic dome. Index lesions documented previously are similar. Index rim enhancing posterior hepatic dome lesion measured previously at 2.0 x 1.5 cm is 1.7 x 1.4 cm today on image 18/15. Previously described adjacent index 10 x 6 mm lesion is 6 x 6 mm today on image 19/15. 3rd index lesion in this region more inferiorly in the posterior right liver was measured previously at 7 x 4 mm which compares to 7 x 4 mm today on 22/15. The lesion showing diffuse arterial phase hyperenhancement previously at 8 x 8 mm is 9 x 8 mm today on image 20/15. As before, there is no washout below background liver parenchyma levels in this lesion and no enhancing capsule on delayed imaging. Tiny fat intensity lesion along the anterior liver capsule at the dome is stable. There is no evidence for gallstones, gallbladder wall thickening, or pericholecystic fluid. No intrahepatic or extrahepatic biliary dilation. Pancreas: No focal mass lesion. No dilatation of the main duct. No intraparenchymal cyst. No peripancreatic edema. Spleen:  No splenomegaly. No focal mass lesion. Adrenals/Urinary Tract: No adrenal nodule or mass. Kidneys unremarkable. Stomach/Bowel: Stomach is unremarkable. No gastric wall thickening. No evidence of outlet obstruction. Duodenum is normally positioned as is the ligament of Treitz. No small bowel or colonic dilatation within the visualized abdomen. Vascular/Lymphatic: No abdominal aortic aneurysm. Portal vein and superior mesenteric vein are patent. Splenic vein is patent. There is no gastrohepatic or hepatoduodenal ligament lymphadenopathy. No  retroperitoneal or mesenteric lymphadenopathy. Other:  No intraperitoneal free fluid. Musculoskeletal: No focal suspicious marrow enhancement within the visualized bony anatomy. IMPRESSION: 1. Stable appearance  of the previously treated segment VIII lesion. LI-RADS TR, nonviable. 2. Stable appearance of the index lesions in the posterior right hepatic dome. As before, there is early heterogeneous but relatively diffuse enhancement in the posterior right hepatic dome, likely treatment related. Index lesions documented previously are similar in size and enhancement characteristics without suspicious enhancement characteristics. LI-RADS TR, nonviable. 3. Small lesion showing arterial phase hyperenhancement in the anterior right liver (segment II right) persists. As before there is no definite washout and no enhancing capsule associated with this lesion. Given lack of interval growth, this lesion is downgraded to LI-RADS 3. Close continued follow-up recommended. 4. No new or progressive findings in the abdomen. Electronically Signed   By: Misty Stanley M.D.   On: 12/18/2022 13:11      Orders Placed This Encounter  Procedures   Ambulatory referral to Gastroenterology    Referral Priority:   Routine    Referral Type:   Consultation    Referral Reason:   Specialty Services Required    Number of Visits Requested:   1   All questions were answered. The patient knows to call the clinic with any problems, questions or concerns. No barriers to learning was detected. The total time spent in the appointment was 30 minutes.     [name], MD 12/19/2022   Felicity Coyer, CMA, am acting as scribe for [name], MD.   I have reviewed the above documentation for accuracy and completeness, and I agree with the above.

## 2022-12-21 LAB — AFP TUMOR MARKER: AFP, Serum, Tumor Marker: 2.7 ng/mL (ref 0.0–8.4)

## 2023-03-14 ENCOUNTER — Other Ambulatory Visit: Payer: Self-pay

## 2023-03-19 NOTE — Assessment & Plan Note (Signed)
-  cT3N0M, stage IIIA    -diagnosed in 02/2021, liver biopsy 03/08/21 confirmed moderately differentiated hepatocellular carcinoma. No nodal or distant metastasis on CT 03/05/21.  --He is not a candidate for liver resection or transplant. Dr. Gwenlyn Perking at [facility] did not recommend surgery given the location and extent of disease. -He started atezolizumab and bevacizumab on 04/28/21, held after 09/2021 due to newly diagnosed type 1 DM with hyperglycemia which was probably related to immunotherapy. --he underwent segment 8 Y90 on 07/22/21 and 06/15/2022  -he had good response to therapies  -Reviewed his restaging CT 09/15/2022 showed stable treated liver lesion, and a enlarging liver lesion in segment VIII (from 4mm to 8mm, LI-RADS category 4) -MRI from 12/18/2022 showed a stable treated liver lesion, previously LI-RADS category 4 lesion in right lobe of liver is overall stable in size, no definitive washout and enhancing capsule, it is considered LI RADS 3 now.  No additional new lesion. -He is clinically doing well,

## 2023-03-20 ENCOUNTER — Other Ambulatory Visit: Payer: Self-pay

## 2023-03-20 ENCOUNTER — Inpatient Hospital Stay (HOSPITAL_BASED_OUTPATIENT_CLINIC_OR_DEPARTMENT_OTHER): Payer: 59 | Admitting: Hematology

## 2023-03-20 ENCOUNTER — Encounter: Payer: Self-pay | Admitting: Hematology

## 2023-03-20 ENCOUNTER — Inpatient Hospital Stay: Payer: 59 | Attending: Hematology

## 2023-03-20 VITALS — BP 142/72 | HR 64 | Temp 98.1°F | Resp 18 | Ht 65.0 in | Wt 130.1 lb

## 2023-03-20 DIAGNOSIS — Z794 Long term (current) use of insulin: Secondary | ICD-10-CM | POA: Diagnosis not present

## 2023-03-20 DIAGNOSIS — E1065 Type 1 diabetes mellitus with hyperglycemia: Secondary | ICD-10-CM | POA: Insufficient documentation

## 2023-03-20 DIAGNOSIS — N200 Calculus of kidney: Secondary | ICD-10-CM | POA: Diagnosis not present

## 2023-03-20 DIAGNOSIS — K7460 Unspecified cirrhosis of liver: Secondary | ICD-10-CM | POA: Insufficient documentation

## 2023-03-20 DIAGNOSIS — B1920 Unspecified viral hepatitis C without hepatic coma: Secondary | ICD-10-CM | POA: Diagnosis not present

## 2023-03-20 DIAGNOSIS — C220 Liver cell carcinoma: Secondary | ICD-10-CM | POA: Insufficient documentation

## 2023-03-20 DIAGNOSIS — I2510 Atherosclerotic heart disease of native coronary artery without angina pectoris: Secondary | ICD-10-CM | POA: Diagnosis not present

## 2023-03-20 DIAGNOSIS — R160 Hepatomegaly, not elsewhere classified: Secondary | ICD-10-CM

## 2023-03-20 DIAGNOSIS — I700 Atherosclerosis of aorta: Secondary | ICD-10-CM | POA: Diagnosis not present

## 2023-03-20 DIAGNOSIS — Z9221 Personal history of antineoplastic chemotherapy: Secondary | ICD-10-CM | POA: Insufficient documentation

## 2023-03-20 LAB — CMP (CANCER CENTER ONLY)
ALT: 27 U/L (ref 0–44)
AST: 35 U/L (ref 15–41)
Albumin: 3.7 g/dL (ref 3.5–5.0)
Alkaline Phosphatase: 58 U/L (ref 38–126)
Anion gap: 5 (ref 5–15)
BUN: 14 mg/dL (ref 8–23)
CO2: 27 mmol/L (ref 22–32)
Calcium: 9.2 mg/dL (ref 8.9–10.3)
Chloride: 100 mmol/L (ref 98–111)
Creatinine: 0.95 mg/dL (ref 0.61–1.24)
GFR, Estimated: >60 mL/min (ref >60)
Glucose, Bld: 402 mg/dL — ABNORMAL HIGH (ref 70–99)
Potassium: 4.5 mmol/L (ref 3.5–5.1)
Sodium: 132 mmol/L — ABNORMAL LOW (ref 135–145)
Total Bilirubin: 0.6 mg/dL (ref 0.3–1.2)
Total Protein: 7.1 g/dL (ref 6.5–8.1)

## 2023-03-20 LAB — CBC WITH DIFFERENTIAL (CANCER CENTER ONLY)
Abs Immature Granulocytes: 0.02 10*3/uL (ref 0.00–0.07)
Basophils Absolute: 0 10*3/uL (ref 0.0–0.1)
Basophils Relative: 0 %
Eosinophils Absolute: 0.1 10*3/uL (ref 0.0–0.5)
Eosinophils Relative: 2 %
HCT: 36.6 % — ABNORMAL LOW (ref 39.0–52.0)
Hemoglobin: 12.5 g/dL — ABNORMAL LOW (ref 13.0–17.0)
Immature Granulocytes: 0 %
Lymphocytes Relative: 11 %
Lymphs Abs: 0.5 10*3/uL — ABNORMAL LOW (ref 0.7–4.0)
MCH: 30.3 pg (ref 26.0–34.0)
MCHC: 34.2 g/dL (ref 30.0–36.0)
MCV: 88.6 fL (ref 80.0–100.0)
Monocytes Absolute: 0.4 10*3/uL (ref 0.1–1.0)
Monocytes Relative: 9 %
Neutro Abs: 3.6 10*3/uL (ref 1.7–7.7)
Neutrophils Relative %: 78 %
Platelet Count: 96 10*3/uL — ABNORMAL LOW (ref 150–400)
RBC: 4.13 MIL/uL — ABNORMAL LOW (ref 4.22–5.81)
RDW: 12.3 % (ref 11.5–15.5)
WBC Count: 4.6 10*3/uL (ref 4.0–10.5)
nRBC: 0 % (ref 0.0–0.2)

## 2023-03-20 NOTE — Progress Notes (Signed)
[facility]   Telephone:(336) [phone] Fax:(336) [phone]   Clinic Follow up Note   Patient Care Team: Quitman Livings, MD as PCP - General (Internal Medicine) Malachy Mood, MD as Consulting Physician (Hematology and Oncology) Radonna Ricker, RN (Inactive) as Oncology Nurse Navigator  Date of Service:  03/20/2023  CHIEF COMPLAINT: f/u of multifocal HCC     CURRENT THERAPY:  Surveillance       ASSESSMENT:  Shawn Bailey is a 77 y.o. male with    Hepatocellular carcinoma (HCC) -cT3N0M, stage IIIA    -diagnosed in 02/2021, liver biopsy 03/08/21 confirmed moderately differentiated hepatocellular carcinoma. No nodal or distant metastasis on CT 03/05/21.  --He is not a candidate for liver resection or transplant. Dr. Gwenlyn Perking at [facility] did not recommend surgery given the location and extent of disease. -He started atezolizumab and bevacizumab on 04/28/21, held after 09/2021 due to newly diagnosed type 1 DM with hyperglycemia which was probably related to immunotherapy. --he underwent segment 8 Y90 on 07/22/21 and 06/15/2022  -he had good response to therapies  -Reviewed his restaging CT 09/15/2022 showed stable treated liver lesion, and a enlarging liver lesion in segment VIII (from 4mm to 8mm, LI-RADS category 4) -I personally reviewed his restaging MRI from last week, which showed a stable treated liver lesion, previously LI-RADS [name] category 4 lesion in right lobe of liver is overall stable in size, no definitive washout and enhancing capsule, it is considered LI RADS 3 now.  No additional new lesion. -Patient has developed significant intermittent pain in the right upper quadrant of abdomen, exam showed enlarged left lobe of liver, mild tenderness. -Will repeat liver MRI in the next few weeks -We discussed the option of TKI, will repeat a Y90, will restart immunotherapy if he has cancer progression.   2. Hep C, untreated, liver cirrhosis  -This was discovered when he was  found to have HCC. -He does not have a GI doctor, I discussed the benefit of screening EGD for varices.  He never had a colonoscopy either. -Will refer him to GI Dr. Elnoria Howard    3. Type 1 DM -on insulin, f/u with PCP    PLAN: - order a liver MRI to be done in 1-2 weeks - f/u in two weeks to review MRI results     SUMMARY OF ONCOLOGIC HISTORY: Oncology History Overview Note  Cancer Staging Hepatocellular carcinoma [facility]) Staging form: Liver, AJCC 8th Edition - Clinical stage from 03/08/2021: Stage IIIA (cT3, cN0, cM0) - Signed by Malachy Mood, MD on 03/11/2021 Stage prefix: Initial diagnosis Histologic grade (G): G2 Histologic grading system: 4 grade system     Hepatocellular carcinoma (HCC)   Initial Diagnosis   Liver mass, right lobe   03/05/2021 Imaging   CT CAP  IMPRESSION: 1. Right hepatic lobe masses which are suspicious for either metastatic disease or primary malignancy such as hepatocellular carcinoma. These are suboptimally evaluated on this noncontrast exam. Consider multidisciplinary oncology consultation with potential clinical strategies of tissue sampling versus PET to direct sampling. 2. No primary malignancy identified within the chest, abdomen, or pelvis, given limitation of noncontrast exam. 3. Left nephrolithiasis. 4. Coronary artery atherosclerosis. Aortic Atherosclerosis (ICD10-I70.0). 5. Esophageal air fluid level suggests dysmotility or gastroesophageal reflux.   03/06/2021 Imaging   MRI Liver  IMPRESSION: 9.5 cm heterogeneous hypovascular mass in anterior right hepatic lobe, and 1.3 cm hypervascular mass in the posterior right hepatic lobe. Differential diagnosis includes liver metastases and multifocal hepatocellular carcinoma. Suggest correlation with serum  AFP level, and possible tissue sampling.   No evidence of abdominal metastatic disease.   03/08/2021 Initial Biopsy   A. LIVER, RIGHT, BIOPSY:  - Hepatocellular carcinoma.   COMMENT:  The  hepatocellular carcinoma is moderately differentiated and associated with areas of necrosis.  Immunohistochemistry shows positivity with glypican-3 and weak staining with HepPar 1.  The tumor is negative with arginase 1, alpha-fetoprotein, CDX2, cytokeratin 7, cytokeratin 20, cytokeratin AE1/AE3, PSA, prostein and MOC31.  Polyclonal CEA shows focal canalicular pattern.  The morphology and immunophenotype are consistent with hepatocellular carcinoma.    03/08/2021 Cancer Staging   Staging form: Liver, AJCC 8th Edition - Clinical stage from 03/08/2021: Stage IIIA (cT3, cN0, cM0) - Signed by Malachy Mood, MD on 03/11/2021 Stage prefix: Initial diagnosis Histologic grade (G): G2 Histologic grading system: 4 grade system   04/28/2021 - 10/13/2021 Chemotherapy   Patient is on Treatment Plan : LUNG Atezolizumab + Bevacizumab q21d Maintenance     06/16/2021 Imaging   CT Abdomen  IMPRESSION: Marked interval decrease in size of the large RIGHT hepatic lobe lesion, biopsy-proven hepatocellular carcinoma. No arterial phase enhancement on early phase, vague enhancement on later phase greater than 20 Hounsfield unit enhancement on venous phase. Less pronounced arterial enhancement than on previous imaging with small peripheral foci of enhancement on arterial phase particularly along the margin of the tumor near the dome of the liver (image 12/2) 11 mm focus of enhancement in this area.   Interval response to therapy of a small lesion previously compatible with LI-RADS category 5 lesion in the posterior RIGHT hepatic lobe.   No new hepatic lesion.   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   Signs of nephrolithiasis calculus on the LEFT measuring approximately 7 mm.   Colonic diverticulosis without evidence of acute diverticulitis.   Aortic Atherosclerosis (ICD10-I70.0).   11/02/2021 Imaging   EXAM: CT ABDOMEN AND PELVIS WITH CONTRAST   IMPRESSION: 1. Right hepatic mass has  minimally decreased in size. No new focal hepatic masses are seen. 2. New small amount of ascites in the right upper quadrant. 3. Wall thickening of the cecum and ascending colon versus normal under distension. Correlate for nonspecific colitis. 4. Nonobstructing left renal calculus. 5.  Aortic Atherosclerosis (ICD10-I70.0).   09/15/2022 Imaging   IMPRESSION: 1. No significant change in post treatment appearance of hepatic segment VIII, with a well circumscribed, intrinsically T1 hyperintense lesion of the liver dome as well as additional heterogeneous, intrinsic T1 hyperintensity just superiorly. LI-RADS TR, nonviable. 2. Following radioembolization, new background parenchymal hyperenhancement of hepatic segment VII, with faint rim enhancing, although internally nonenhancing lesions in the posterior liver dome corresponding to previously arterially hyperenhancing liver lesions. This is consistent with post treatment hyperemia without evidence of residual viable tumor. LI-RADS TR, nonviable. 3. Interval enlargement of an arterially hyperenhancing lesion in the anterior right lobe of the liver, near the margin of hepatic segment VIII, measuring 0.8 x 0.8 cm, previously no greater than 0.4 cm. This does not demonstrate clear evidence of washout or capsular enhancement. LI-RADS category 4 due to threshold growth.      Imaging     09/15/2022 Imaging    IMPRESSION: 1. No significant change in post treatment appearance of hepatic segment VIII, with a well circumscribed, intrinsically T1 hyperintense lesion of the liver dome as well as additional heterogeneous, intrinsic T1 hyperintensity just superiorly. LI-RADS TR, nonviable. 2. Following radioembolization, new background parenchymal hyperenhancement of hepatic segment VII, with faint rim enhancing, although internally  nonenhancing lesions in the posterior liver dome corresponding to previously arterially hyperenhancing liver  lesions. This is consistent with post treatment hyperemia without evidence of residual viable tumor. LI-RADS TR, nonviable. 3. Interval enlargement of an arterially hyperenhancing lesion in the anterior right lobe of the liver, near the margin of hepatic segment VIII, measuring 0.8 x 0.8 cm, previously no greater than 0.4 cm. This does not demonstrate clear evidence of washout or capsular enhancement. LI-RADS category 4 due to threshold growth.     12/18/2022 Imaging    IMPRESSION: 1. Stable appearance of the previously treated segment VIII lesion. LI-RADS TR, nonviable. 2. Stable appearance of the index lesions in the posterior right hepatic dome. As before, there is early heterogeneous but relatively diffuse enhancement in the posterior right hepatic dome, likely treatment related. Index lesions documented previously are similar in size and enhancement characteristics without suspicious enhancement characteristics. LI-RADS TR, nonviable. 3. Small lesion showing arterial phase hyperenhancement in the anterior right liver (segment II right) persists. As before there is no definite washout and no enhancing capsule associated with this lesion. Given lack of interval growth, this lesion is downgraded to LI-RADS 3. Close continued follow-up recommended. 4. No new or progressive findings in the abdomen.        INTERVAL HISTORY:  Shawn Bailey is here for a follow up of multifocal HCC. He was last seen by me on 12/19/2022. He presents to clinic with his family.  Patient states he is having pain in his liver. The pain only last a few minutes. Patients blood sugar is elevated today     MEDICAL HISTORY:  Past Medical History:  Diagnosis Date   Depression    Hepatocellular carcinoma (HCC) 03/08/2021   Parasite infestation     SURGICAL HISTORY: Past Surgical History:  Procedure Laterality Date   IR 3D INDEPENDENT WKST  05/25/2022   IR ANGIOGRAM SELECTIVE EACH ADDITIONAL VESSEL   07/01/2021   IR ANGIOGRAM SELECTIVE EACH ADDITIONAL VESSEL  07/22/2021   IR ANGIOGRAM SELECTIVE EACH ADDITIONAL VESSEL  07/22/2021   IR ANGIOGRAM SELECTIVE EACH ADDITIONAL VESSEL  07/22/2021   IR ANGIOGRAM SELECTIVE EACH ADDITIONAL VESSEL  07/22/2021   IR ANGIOGRAM SELECTIVE EACH ADDITIONAL VESSEL  05/25/2022   IR ANGIOGRAM SELECTIVE EACH ADDITIONAL VESSEL  05/25/2022   IR ANGIOGRAM SELECTIVE EACH ADDITIONAL VESSEL  05/25/2022   IR ANGIOGRAM SELECTIVE EACH ADDITIONAL VESSEL  05/25/2022   IR ANGIOGRAM SELECTIVE EACH ADDITIONAL VESSEL  05/25/2022   IR ANGIOGRAM SELECTIVE EACH ADDITIONAL VESSEL  05/25/2022   IR ANGIOGRAM SELECTIVE EACH ADDITIONAL VESSEL  06/15/2022   IR ANGIOGRAM SELECTIVE EACH ADDITIONAL VESSEL  06/15/2022   IR ANGIOGRAM SELECTIVE EACH ADDITIONAL VESSEL  06/15/2022   IR ANGIOGRAM VISCERAL SELECTIVE  07/01/2021   IR ANGIOGRAM VISCERAL SELECTIVE  07/22/2021   IR ANGIOGRAM VISCERAL SELECTIVE  05/25/2022   IR ANGIOGRAM VISCERAL SELECTIVE  06/15/2022   IR EMBO ARTERIAL NOT HEMORR HEMANG INC GUIDE ROADMAPPING  07/01/2021   IR EMBO TUMOR ORGAN ISCHEMIA INFARCT INC GUIDE ROADMAPPING  07/22/2021   IR EMBO TUMOR ORGAN ISCHEMIA INFARCT INC GUIDE ROADMAPPING  06/15/2022   IR RADIOLOGIST EVAL & MGMT  06/01/2021   IR RADIOLOGIST EVAL & MGMT  04/26/2022   IR RADIOLOGIST EVAL & MGMT  07/12/2022   IR US GUIDE VASC ACCESS RIGHT  07/01/2021   IR US GUIDE VASC ACCESS RIGHT  07/22/2021   IR US GUIDE VASC ACCESS RIGHT  05/25/2022   IR US GUIDE VASC ACCESS RIGHT  06/15/2022  NO PAST SURGERIES      I have reviewed the social history and family history with the patient and they are unchanged from previous note.  ALLERGIES:  has No Known Allergies.  MEDICATIONS:  Current Outpatient Medications  Medication Sig Dispense Refill   Ascorbic Acid (VITAMIN C) 100 MG tablet Take 100 mg by mouth daily.     blood glucose meter kit and supplies Dispense based on patient and insurance preference. Use up to four times daily as  directed. (FOR ICD-10 E10.9, E11.9). 1 each 0   HYDROcodone-acetaminophen (NORCO) 5-325 MG tablet Take 1 tablet by mouth every 12 (twelve) hours as needed for moderate pain. 45 tablet 0   insulin glargine (LANTUS) 100 UNIT/ML injection 40 Units daily.     Insulin Pen Needle (PEN NEEDLES 3/16") 31G X 5 MM MISC 1 each by Does not apply route with breakfast, with lunch, and with evening meal. 100 each 3   Multiple Vitamin (MULTIVITAMIN WITH MINERALS) TABS tablet Take 1 tablet by mouth daily.     ondansetron (ZOFRAN) 8 MG tablet Take 1 tablet (8 mg total) by mouth every 8 (eight) hours as needed for nausea or vomiting. 30 tablet 0   polyethylene glycol (MIRALAX / GLYCOLAX) 17 g packet Take 17 g by mouth daily as needed for mild constipation. 14 each 0   prochlorperazine (COMPAZINE) 10 MG tablet Take 1 tablet (10 mg total) by mouth every 6 (six) hours as needed for nausea or vomiting. 30 tablet 0   senna (SENOKOT) 8.6 MG TABS tablet Take 1 tablet (8.6 mg total) by mouth 2 (two) times daily. (Patient taking differently: Take 1 tablet by mouth 2 (two) times daily as needed for moderate constipation.) 120 tablet 0   vitamin B-12 (CYANOCOBALAMIN) 100 MCG tablet Take 100 mcg by mouth daily.     No current facility-administered medications for this visit.   Facility-Administered Medications Ordered in Other Visits  Medication Dose Route Frequency Provider Last Rate Last Admin   0.9 %  sodium chloride infusion   Intravenous Continuous Malachy Mood, MD        PHYSICAL EXAMINATION: ECOG PERFORMANCE STATUS: 1 - Symptomatic but completely ambulatory  Vitals:   03/20/23 1137  BP: (!) 142/72  Pulse: 64  Resp: 18  Temp: 98.1 F (36.7 C)  SpO2: 100%   Wt Readings from Last 3 Encounters:  03/20/23 130 lb 1.6 oz (59 kg)  12/19/22 126 lb 14.4 oz (57.6 kg)  09/19/22 133 lb 14.4 oz (60.7 kg)     GENERAL:alert, no distress and comfortable SKIN: skin color, texture, turgor are normal, no rashes or significant  lesions EYES: normal, Conjunctiva are pink and non-injected, sclera clear NECK: supple, thyroid normal size, non-tender, without nodularity LYMPH:  no palpable lymphadenopathy in the cervical, axillary  LUNGS: clear to auscultation and percussion with normal breathing effort HEART: regular rate & rhythm and no murmurs and no lower extremity edema ABDOMEN:abdomen soft, (+) hepatomegaly, especially in epigastric area, with mild tenderness. Musculoskeletal:no cyanosis of digits and no clubbing  NEURO: alert & oriented x 3 with fluent speech, no focal motor/sensory deficits  LABORATORY DATA:  I have reviewed the data as listed    Latest Ref Rng & Units 03/20/2023   10:16 AM 12/19/2022   10:53 AM 09/19/2022   10:49 AM  CBC  WBC 4.0 - 10.5 K/uL 4.6  4.3  3.4   Hemoglobin 13.0 - 17.0 g/dL 16.1  09.6  04.5   Hematocrit 39.0 - 52.0 %  36.6  36.8  37.4   Platelets 150 - 400 K/uL 96  102  85         Latest Ref Rng & Units 03/20/2023   10:16 AM 12/19/2022   10:53 AM 09/19/2022   10:49 AM  CMP  Glucose 70 - 99 mg/dL 161  096  045   BUN 8 - 23 mg/dL 14  16  13    Creatinine 0.61 - 1.24 mg/dL 4.09  8.11  9.14   Sodium 135 - 145 mmol/L 132  132  132   Potassium 3.5 - 5.1 mmol/L 4.5  4.2  4.5   Chloride 98 - 111 mmol/L 100  100  100   CO2 22 - 32 mmol/L 27  28  27    Calcium 8.9 - 10.3 mg/dL 9.2  9.4  9.8   Total Protein 6.5 - 8.1 g/dL 7.1  7.5  7.8   Total Bilirubin 0.3 - 1.2 mg/dL 0.6  0.7  0.9   Alkaline Phos 38 - 126 U/L 58  61  58   AST 15 - 41 U/L 35  31  37   ALT 0 - 44 U/L 27  26  27        RADIOGRAPHIC STUDIES: I have personally reviewed the radiological images as listed and agreed with the findings in the report. No results found.    No orders of the defined types were placed in this encounter.  All questions were answered. The patient knows to call the clinic with any problems, questions or concerns. No barriers to learning was detected. The total time spent in the appointment was  30 minutes.     Malachy Mood, MD 03/20/2023   I, Sharlette Dense, CMA, am acting as scribe for Malachy Mood, MD.   I have reviewed the above documentation for accuracy and completeness, and I agree with the above.

## 2023-03-22 LAB — AFP TUMOR MARKER: AFP, Serum, Tumor Marker: 3.2 ng/mL (ref 0.0–8.4)

## 2023-03-29 ENCOUNTER — Ambulatory Visit (HOSPITAL_COMMUNITY)
Admission: RE | Admit: 2023-03-29 | Discharge: 2023-03-29 | Disposition: A | Discharge status: Home / Self Care | Payer: 59 | Source: Ambulatory Visit | Attending: Hematology | Admitting: Hematology

## 2023-03-29 DIAGNOSIS — R160 Hepatomegaly, not elsewhere classified: Secondary | ICD-10-CM | POA: Diagnosis not present

## 2023-03-29 MED ORDER — GADOBUTROL 1 MMOL/ML IV SOLN
6.0000 mL | Freq: Once | INTRAVENOUS | Status: AC | PRN
  Administered 2023-03-29: 6 mL via INTRAVENOUS

## 2023-04-06 ENCOUNTER — Encounter: Payer: Self-pay | Admitting: Hematology

## 2023-04-06 ENCOUNTER — Inpatient Hospital Stay (HOSPITAL_BASED_OUTPATIENT_CLINIC_OR_DEPARTMENT_OTHER): Payer: 59 | Admitting: Hematology

## 2023-04-06 ENCOUNTER — Other Ambulatory Visit: Payer: Self-pay

## 2023-04-06 VITALS — BP 140/81 | HR 78 | Temp 98.4°F | Resp 18 | Ht 65.0 in | Wt 128.6 lb

## 2023-04-06 DIAGNOSIS — C220 Liver cell carcinoma: Secondary | ICD-10-CM

## 2023-04-06 NOTE — Assessment & Plan Note (Addendum)
cT3N0M, stage IIIA    -diagnosed in 02/2021, liver biopsy 03/08/21 confirmed moderately differentiated hepatocellular carcinoma. No nodal or distant metastasis on CT 03/05/21.  --He is not a candidate for liver resection or transplant. Dr. Gwenlyn Perking at [facility] did not recommend surgery given the location and extent of disease. -He started atezolizumab and bevacizumab on 04/28/21, held after 09/2021 due to newly diagnosed type 1 DM with hyperglycemia which was probably related to immunotherapy. --he underwent segment 8 Y90 on 07/22/21 and 06/15/2022  -he had good response to therapies  -Reviewed his restaging CT 09/15/2022 showed stable treated liver lesion, and a enlarging liver lesion in segment VIII (from 4mm to 8mm, LI-RADS category 4) -I personally reviewed his restaging MRI from last week, which showed a stable treated liver lesion, previously LI-RADS [name] category 4 lesion in right lobe of liver is overall stable in size, no definitive washout and enhancing capsule, it is considered LI RADS 3 now.  No additional new lesion. -Patient has developed significant intermittent pain in the right upper quadrant of abdomen, exam showed enlarged left lobe of liver, mild tenderness. -Restaging liver MRI on March 30, 2023 showed stable disease, no new lesions. -His tumor marker AFP remains to be negative (initially at 1479) -given no evidence of disease progression, will continue observation.

## 2023-04-06 NOTE — Progress Notes (Signed)
[facility]   [phone] Fax:([phone]   Clinic Follow up Note   Patient Care Team: Quitman Livings, MD as PCP - General (Internal Medicine) Malachy Mood, MD as Consulting Physician (Hematology and Oncology) Radonna Ricker, RN (Inactive) as Oncology Nurse Navigator  Date of Service:  04/06/2023  CHIEF COMPLAINT: f/u of multifocal HCC    CURRENT THERAPY:  Surveillance  ASSESSMENT:  Shawn Bailey is a 77 y.o. male with   Hepatocellular carcinoma (HCC) cT3N0M, stage IIIA    -diagnosed in 02/2021, liver biopsy 03/08/21 confirmed moderately differentiated hepatocellular carcinoma. No nodal or distant metastasis on CT 03/05/21.  --He is not a candidate for liver resection or transplant. Dr. Gwenlyn Perking at [facility] did not recommend surgery given the location and extent of disease. -He started atezolizumab and bevacizumab on 04/28/21, held after 09/2021 due to newly diagnosed type 1 DM with hyperglycemia which was probably related to immunotherapy. --he underwent segment 8 Y90 on 07/22/21 and 06/15/2022  -he had good response to therapies  -Reviewed his restaging CT 09/15/2022 showed stable treated liver lesion, and a enlarging liver lesion in segment VIII (from 4mm to 8mm, LI-RADS category 4) -I personally reviewed his restaging MRI from last week, which showed a stable treated liver lesion, previously LI-RADS [name] category 4 lesion in right lobe of liver is overall stable in size, no definitive washout and enhancing capsule, it is considered LI RADS 3 now.  No additional new lesion. -Patient has developed significant intermittent pain in the right upper quadrant of abdomen, exam showed enlarged left lobe of liver, mild tenderness. -Restaging liver MRI on March 30, 2023 showed stable disease, no new lesions. -His tumor marker AFP remains to be negative (initially at 1479) -given no evidence of disease progression, will continue observation.    PLAN: -lab  reviewed. -CMP- normal -Tumor marker- negative. -I reviewed restaging liver MRI w/ pt -repeat CT scan in 6 months. -lab and f/u in 3 months.  SUMMARY OF ONCOLOGIC HISTORY: Oncology History Overview Note  Cancer Staging Hepatocellular carcinoma [facility]) Staging form: Liver, AJCC 8th Edition - Clinical stage from 03/08/2021: Stage IIIA (cT3, cN0, cM0) - Signed by Malachy Mood, MD on 03/11/2021 Stage prefix: Initial diagnosis Histologic grade (G): G2 Histologic grading system: 4 grade system     Hepatocellular carcinoma (HCC)   Initial Diagnosis   Liver mass, right lobe   03/05/2021 Imaging   CT CAP  IMPRESSION: 1. Right hepatic lobe masses which are suspicious for either metastatic disease or primary malignancy such as hepatocellular carcinoma. These are suboptimally evaluated on this noncontrast exam. Consider multidisciplinary oncology consultation with potential clinical strategies of tissue sampling versus PET to direct sampling. 2. No primary malignancy identified within the chest, abdomen, or pelvis, given limitation of noncontrast exam. 3. Left nephrolithiasis. 4. Coronary artery atherosclerosis. Aortic Atherosclerosis (ICD10-I70.0). 5. Esophageal air fluid level suggests dysmotility or gastroesophageal reflux.   03/06/2021 Imaging   MRI Liver  IMPRESSION: 9.5 cm heterogeneous hypovascular mass in anterior right hepatic lobe, and 1.3 cm hypervascular mass in the posterior right hepatic lobe. Differential diagnosis includes liver metastases and multifocal hepatocellular carcinoma. Suggest correlation with serum AFP level, and possible tissue sampling.   No evidence of abdominal metastatic disease.   03/08/2021 Initial Biopsy   A. LIVER, RIGHT, BIOPSY:  - Hepatocellular carcinoma.   COMMENT:  The hepatocellular carcinoma is moderately differentiated and associated with areas of necrosis.  Immunohistochemistry shows positivity with glypican-3 and weak staining with  HepPar 1.  The tumor is negative with arginase 1, alpha-fetoprotein, CDX2, cytokeratin 7, cytokeratin 20, cytokeratin AE1/AE3, PSA, prostein and MOC31.  Polyclonal CEA shows focal canalicular pattern.  The morphology and immunophenotype are consistent with hepatocellular carcinoma.    03/08/2021 Cancer Staging   Staging form: Liver, AJCC 8th Edition - Clinical stage from 03/08/2021: Stage IIIA (cT3, cN0, cM0) - Signed by Malachy Mood, MD on 03/11/2021 Stage prefix: Initial diagnosis Histologic grade (G): G2 Histologic grading system: 4 grade system   04/28/2021 - 10/13/2021 Chemotherapy   Patient is on Treatment Plan : LUNG Atezolizumab + Bevacizumab q21d Maintenance     06/16/2021 Imaging   CT Abdomen  IMPRESSION: Marked interval decrease in size of the large RIGHT hepatic lobe lesion, biopsy-proven hepatocellular carcinoma. No arterial phase enhancement on early phase, vague enhancement on later phase greater than 20 Hounsfield unit enhancement on venous phase. Less pronounced arterial enhancement than on previous imaging with small peripheral foci of enhancement on arterial phase particularly along the margin of the tumor near the dome of the liver (image 12/2) 11 mm focus of enhancement in this area.   Interval response to therapy of a small lesion previously compatible with LI-RADS category 5 lesion in the posterior RIGHT hepatic lobe.   No new hepatic lesion.   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   Signs of nephrolithiasis calculus on the LEFT measuring approximately 7 mm.   Colonic diverticulosis without evidence of acute diverticulitis.   Aortic Atherosclerosis (ICD10-I70.0).   11/02/2021 Imaging   EXAM: CT ABDOMEN AND PELVIS WITH CONTRAST   IMPRESSION: 1. Right hepatic mass has minimally decreased in size. No new focal hepatic masses are seen. 2. New small amount of ascites in the right upper quadrant. 3. Wall thickening of the cecum and ascending  colon versus normal under distension. Correlate for nonspecific colitis. 4. Nonobstructing left renal calculus. 5.  Aortic Atherosclerosis (ICD10-I70.0).   09/15/2022 Imaging   IMPRESSION: 1. No significant change in post treatment appearance of hepatic segment VIII, with a well circumscribed, intrinsically T1 hyperintense lesion of the liver dome as well as additional heterogeneous, intrinsic T1 hyperintensity just superiorly. LI-RADS TR, nonviable. 2. Following radioembolization, new background parenchymal hyperenhancement of hepatic segment VII, with faint rim enhancing, although internally nonenhancing lesions in the posterior liver dome corresponding to previously arterially hyperenhancing liver lesions. This is consistent with post treatment hyperemia without evidence of residual viable tumor. LI-RADS TR, nonviable. 3. Interval enlargement of an arterially hyperenhancing lesion in the anterior right lobe of the liver, near the margin of hepatic segment VIII, measuring 0.8 x 0.8 cm, previously no greater than 0.4 cm. This does not demonstrate clear evidence of washout or capsular enhancement. LI-RADS category 4 due to threshold growth.      Imaging     09/15/2022 Imaging    IMPRESSION: 1. No significant change in post treatment appearance of hepatic segment VIII, with a well circumscribed, intrinsically T1 hyperintense lesion of the liver dome as well as additional heterogeneous, intrinsic T1 hyperintensity just superiorly. LI-RADS TR, nonviable. 2. Following radioembolization, new background parenchymal hyperenhancement of hepatic segment VII, with faint rim enhancing, although internally nonenhancing lesions in the posterior liver dome corresponding to previously arterially hyperenhancing liver lesions. This is consistent with post treatment hyperemia without evidence of residual viable tumor. LI-RADS TR, nonviable. 3. Interval enlargement of an arterially  hyperenhancing lesion in the anterior right lobe of the liver, near the margin of hepatic segment VIII, measuring 0.8 x 0.8 cm,  previously no greater than 0.4 cm. This does not demonstrate clear evidence of washout or capsular enhancement. LI-RADS category 4 due to threshold growth.     12/18/2022 Imaging    IMPRESSION: 1. Stable appearance of the previously treated segment VIII lesion. LI-RADS TR, nonviable. 2. Stable appearance of the index lesions in the posterior right hepatic dome. As before, there is early heterogeneous but relatively diffuse enhancement in the posterior right hepatic dome, likely treatment related. Index lesions documented previously are similar in size and enhancement characteristics without suspicious enhancement characteristics. LI-RADS TR, nonviable. 3. Small lesion showing arterial phase hyperenhancement in the anterior right liver (segment II right) persists. As before there is no definite washout and no enhancing capsule associated with this lesion. Given lack of interval growth, this lesion is downgraded to LI-RADS 3. Close continued follow-up recommended. 4. No new or progressive findings in the abdomen.        INTERVAL HISTORY:  Shawn Bailey is here for a follow up of multifocal HCC . He was last seen by me on 03/20/2023. He presents to the clinic accompanied by family. Pt state that his pain is still about the same. Pt is stating that he is very active.       All other systems were reviewed with the patient and are negative.  MEDICAL HISTORY:  Past Medical History:  Diagnosis Date   Depression    Hepatocellular carcinoma (HCC) 03/08/2021   Parasite infestation     SURGICAL HISTORY: Past Surgical History:  Procedure Laterality Date   IR 3D INDEPENDENT WKST  05/25/2022   IR ANGIOGRAM SELECTIVE EACH ADDITIONAL VESSEL  07/01/2021   IR ANGIOGRAM SELECTIVE EACH ADDITIONAL VESSEL  07/22/2021   IR ANGIOGRAM SELECTIVE EACH ADDITIONAL VESSEL   07/22/2021   IR ANGIOGRAM SELECTIVE EACH ADDITIONAL VESSEL  07/22/2021   IR ANGIOGRAM SELECTIVE EACH ADDITIONAL VESSEL  07/22/2021   IR ANGIOGRAM SELECTIVE EACH ADDITIONAL VESSEL  05/25/2022   IR ANGIOGRAM SELECTIVE EACH ADDITIONAL VESSEL  05/25/2022   IR ANGIOGRAM SELECTIVE EACH ADDITIONAL VESSEL  05/25/2022   IR ANGIOGRAM SELECTIVE EACH ADDITIONAL VESSEL  05/25/2022   IR ANGIOGRAM SELECTIVE EACH ADDITIONAL VESSEL  05/25/2022   IR ANGIOGRAM SELECTIVE EACH ADDITIONAL VESSEL  05/25/2022   IR ANGIOGRAM SELECTIVE EACH ADDITIONAL VESSEL  06/15/2022   IR ANGIOGRAM SELECTIVE EACH ADDITIONAL VESSEL  06/15/2022   IR ANGIOGRAM SELECTIVE EACH ADDITIONAL VESSEL  06/15/2022   IR ANGIOGRAM VISCERAL SELECTIVE  07/01/2021   IR ANGIOGRAM VISCERAL SELECTIVE  07/22/2021   IR ANGIOGRAM VISCERAL SELECTIVE  05/25/2022   IR ANGIOGRAM VISCERAL SELECTIVE  06/15/2022   IR EMBO ARTERIAL NOT HEMORR HEMANG INC GUIDE ROADMAPPING  07/01/2021   IR EMBO TUMOR ORGAN ISCHEMIA INFARCT INC GUIDE ROADMAPPING  07/22/2021   IR EMBO TUMOR ORGAN ISCHEMIA INFARCT INC GUIDE ROADMAPPING  06/15/2022   IR RADIOLOGIST EVAL & MGMT  06/01/2021   IR RADIOLOGIST EVAL & MGMT  04/26/2022   IR RADIOLOGIST EVAL & MGMT  07/12/2022   IR US GUIDE VASC ACCESS RIGHT  07/01/2021   IR US GUIDE VASC ACCESS RIGHT  07/22/2021   IR US GUIDE VASC ACCESS RIGHT  05/25/2022   IR US GUIDE VASC ACCESS RIGHT  06/15/2022   NO PAST SURGERIES      I have reviewed the social history and family history with the patient and they are unchanged from previous note.  ALLERGIES:  has No Known Allergies.  MEDICATIONS:  Current Outpatient Medications  Medication Sig Dispense Refill   Ascorbic  Acid (VITAMIN C) 100 MG tablet Take 100 mg by mouth daily.     blood glucose meter kit and supplies Dispense based on patient and insurance preference. Use up to four times daily as directed. (FOR ICD-10 E10.9, E11.9). 1 each 0   HYDROcodone-acetaminophen (NORCO) 5-325 MG tablet Take 1 tablet by mouth every  12 (twelve) hours as needed for moderate pain. 45 tablet 0   insulin glargine (LANTUS) 100 UNIT/ML injection 40 Units daily.     Insulin Pen Needle (PEN NEEDLES 3/16") 31G X 5 MM MISC 1 each by Does not apply route with breakfast, with lunch, and with evening meal. 100 each 3   Multiple Vitamin (MULTIVITAMIN WITH MINERALS) TABS tablet Take 1 tablet by mouth daily.     ondansetron (ZOFRAN) 8 MG tablet Take 1 tablet (8 mg total) by mouth every 8 (eight) hours as needed for nausea or vomiting. 30 tablet 0   polyethylene glycol (MIRALAX / GLYCOLAX) 17 g packet Take 17 g by mouth daily as needed for mild constipation. 14 each 0   prochlorperazine (COMPAZINE) 10 MG tablet Take 1 tablet (10 mg total) by mouth every 6 (six) hours as needed for nausea or vomiting. 30 tablet 0   senna (SENOKOT) 8.6 MG TABS tablet Take 1 tablet (8.6 mg total) by mouth 2 (two) times daily. (Patient taking differently: Take 1 tablet by mouth 2 (two) times daily as needed for moderate constipation.) 120 tablet 0   vitamin B-12 (CYANOCOBALAMIN) 100 MCG tablet Take 100 mcg by mouth daily.     No current facility-administered medications for this visit.   Facility-Administered Medications Ordered in Other Visits  Medication Dose Route Frequency Provider Last Rate Last Admin   0.9 %  sodium chloride infusion   Intravenous Continuous Malachy Mood, MD        PHYSICAL EXAMINATION: ECOG PERFORMANCE STATUS: 1 - Symptomatic but completely ambulatory  Vitals:   04/06/23 1332  BP: (!) 140/81  Pulse: 78  Resp: 18  Temp: 98.4 F (36.9 C)  SpO2: 99%   Wt Readings from Last 3 Encounters:  04/06/23 128 lb 9.6 oz (58.3 kg)  03/20/23 130 lb 1.6 oz (59 kg)  12/19/22 126 lb 14.4 oz (57.6 kg)     GENERAL:alert, no distress and comfortable SKIN: skin color normal, no rashes or significant lesions EYES: normal, Conjunctiva are pink and non-injected, sclera clear  NEURO: alert & oriented x 3 with fluent speech   LABORATORY DATA:  I  have reviewed the data as listed    Latest Ref Rng & Units 03/20/2023   10:16 AM 12/19/2022   10:53 AM 09/19/2022   10:49 AM  CBC  WBC 4.0 - 10.5 K/uL 4.6  4.3  3.4   Hemoglobin 13.0 - 17.0 g/dL 16.1  09.6  04.5   Hematocrit 39.0 - 52.0 % 36.6  36.8  37.4   Platelets 150 - 400 K/uL 96  102  85         Latest Ref Rng & Units 03/20/2023   10:16 AM 12/19/2022   10:53 AM 09/19/2022   10:49 AM  CMP  Glucose 70 - 99 mg/dL 409  811  914   BUN 8 - 23 mg/dL 14  16  13    Creatinine 0.61 - 1.24 mg/dL 7.82  9.56  2.13   Sodium 135 - 145 mmol/L 132  132  132   Potassium 3.5 - 5.1 mmol/L 4.5  4.2  4.5   Chloride 98 - 111  mmol/L 100  100  100   CO2 22 - 32 mmol/L 27  28  27    Calcium 8.9 - 10.3 mg/dL 9.2  9.4  9.8   Total Protein 6.5 - 8.1 g/dL 7.1  7.5  7.8   Total Bilirubin 0.3 - 1.2 mg/dL 0.6  0.7  0.9   Alkaline Phos 38 - 126 U/L 58  61  58   AST 15 - 41 U/L 35  31  37   ALT 0 - 44 U/L 27  26  27        RADIOGRAPHIC STUDIES: I have personally reviewed the radiological images as listed and agreed with the findings in the report. No results found.    No orders of the defined types were placed in this encounter.  All questions were answered. The patient knows to call the clinic with any problems, questions or concerns. No barriers to learning was detected. The total time spent in the appointment was 25 minutes.     Malachy Mood, MD 04/06/2023   Carolin Coy, CMA, am acting as scribe for Malachy Mood, MD.   I have reviewed the above documentation for accuracy and completeness, and I agree with the above.

## 2023-06-26 ENCOUNTER — Other Ambulatory Visit: Payer: Self-pay

## 2023-06-27 ENCOUNTER — Other Ambulatory Visit: Payer: Self-pay

## 2023-06-27 DIAGNOSIS — C220 Liver cell carcinoma: Secondary | ICD-10-CM

## 2023-06-28 ENCOUNTER — Encounter: Payer: Self-pay | Admitting: Hematology

## 2023-06-28 ENCOUNTER — Inpatient Hospital Stay: Payer: 59 | Attending: Hematology

## 2023-06-28 ENCOUNTER — Inpatient Hospital Stay (HOSPITAL_BASED_OUTPATIENT_CLINIC_OR_DEPARTMENT_OTHER): Payer: 59 | Admitting: Hematology

## 2023-06-28 VITALS — BP 139/82 | HR 72 | Temp 97.8°F | Resp 16 | Wt 123.7 lb

## 2023-06-28 DIAGNOSIS — Z79899 Other long term (current) drug therapy: Secondary | ICD-10-CM | POA: Insufficient documentation

## 2023-06-28 DIAGNOSIS — C220 Liver cell carcinoma: Secondary | ICD-10-CM | POA: Diagnosis not present

## 2023-06-28 LAB — CBC WITH DIFFERENTIAL (CANCER CENTER ONLY)
Abs Immature Granulocytes: 0.02 10*3/uL (ref 0.00–0.07)
Basophils Absolute: 0 10*3/uL (ref 0.0–0.1)
Basophils Relative: 0 %
Eosinophils Absolute: 0.1 10*3/uL (ref 0.0–0.5)
Eosinophils Relative: 1 %
HCT: 39.5 % (ref 39.0–52.0)
Hemoglobin: 13.4 g/dL (ref 13.0–17.0)
Immature Granulocytes: 1 %
Lymphocytes Relative: 16 %
Lymphs Abs: 0.7 10*3/uL (ref 0.7–4.0)
MCH: 30.2 pg (ref 26.0–34.0)
MCHC: 33.9 g/dL (ref 30.0–36.0)
MCV: 89 fL (ref 80.0–100.0)
Monocytes Absolute: 0.5 10*3/uL (ref 0.1–1.0)
Monocytes Relative: 10 %
Neutro Abs: 3.1 10*3/uL (ref 1.7–7.7)
Neutrophils Relative %: 72 %
Platelet Count: 130 10*3/uL — ABNORMAL LOW (ref 150–400)
RBC: 4.44 MIL/uL (ref 4.22–5.81)
RDW: 12.4 % (ref 11.5–15.5)
WBC Count: 4.4 10*3/uL (ref 4.0–10.5)
nRBC: 0 % (ref 0.0–0.2)

## 2023-06-28 LAB — CMP (CANCER CENTER ONLY)
ALT: 26 U/L (ref 0–44)
AST: 32 U/L (ref 15–41)
Albumin: 4 g/dL (ref 3.5–5.0)
Alkaline Phosphatase: 57 U/L (ref 38–126)
Anion gap: 4 — ABNORMAL LOW (ref 5–15)
BUN: 16 mg/dL (ref 8–23)
CO2: 29 mmol/L (ref 22–32)
Calcium: 9.7 mg/dL (ref 8.9–10.3)
Chloride: 97 mmol/L — ABNORMAL LOW (ref 98–111)
Creatinine: 0.88 mg/dL (ref 0.61–1.24)
GFR, Estimated: >60 mL/min (ref >60)
Glucose, Bld: 405 mg/dL — ABNORMAL HIGH (ref 70–99)
Potassium: 4.8 mmol/L (ref 3.5–5.1)
Sodium: 130 mmol/L — ABNORMAL LOW (ref 135–145)
Total Bilirubin: 1.1 mg/dL (ref 0.3–1.2)
Total Protein: 7.8 g/dL (ref 6.5–8.1)

## 2023-06-28 MED ORDER — PANTOPRAZOLE SODIUM 40 MG PO TBEC: 40.0000 mg | DELAYED_RELEASE_TABLET | Freq: Every day | ORAL | 1 refills | Status: DC

## 2023-06-28 NOTE — Assessment & Plan Note (Signed)
 cT3N0M, stage IIIA    -diagnosed in 02/2021, liver biopsy 03/08/21 confirmed moderately differentiated hepatocellular carcinoma. No nodal or distant metastasis on CT 03/05/21.  --He is not a candidate for liver resection or transplant. Dr. Romero at [facility] did not recommend surgery given the location and extent of disease. -He started atezolizumab  and bevacizumab  on 04/28/21, held after 09/2021 due to newly diagnosed type 1 DM with hyperglycemia which was probably related to immunotherapy. --he underwent segment 8 Y90 on 07/22/21 and 06/15/2022  -he had good response to therapies  -Reviewed his restaging CT 09/15/2022 showed stable treated liver lesion, and a enlarging liver lesion in segment VIII (from 4mm to 8mm, LI-RADS category 4) -I personally reviewed his restaging MRI from last week, which showed a stable treated liver lesion, previously LI-RADS [name] category 4 lesion in right lobe of liver is overall stable in size, no definitive washout and enhancing capsule, it is considered LI RADS 3 now.  No additional new lesion. -Patient has developed significant intermittent pain in the right upper quadrant of abdomen, exam showed enlarged left lobe of liver, mild tenderness. -Restaging liver MRI on March 30, 2023 showed stable disease, no new lesions. -His tumor marker AFP remains to be negative (initially at 1479) -given no evidence of disease progression, will continue observation.

## 2023-06-28 NOTE — Progress Notes (Signed)
[facility]   [phone] Fax:([phone]) [phone]   Clinic Follow up Note   Patient Care Team: Quitman Livings, MD as PCP - General (Internal Medicine) Malachy Mood, MD as Consulting Physician (Hematology and Oncology) Radonna Ricker, RN (Inactive) as Oncology Nurse Navigator  Date of Service:  06/28/2023  CHIEF COMPLAINT: f/u of multifocal HCC    CURRENT THERAPY:  Surveillance  ASSESSMENT:  Shawn Bailey is a 77 y.o. male with   Hepatocellular carcinoma (HCC) cT3N0M, stage IIIA    -diagnosed in 02/2021, liver biopsy 03/08/21 confirmed moderately differentiated hepatocellular carcinoma. No nodal or distant metastasis on CT 03/05/21.  --He is not a candidate for liver resection or transplant. Dr. Gwenlyn Perking at [facility] did not recommend surgery given the location and extent of disease. -He started atezolizumab and bevacizumab on 04/28/21, held after 09/2021 due to newly diagnosed type 1 DM with hyperglycemia which was probably related to immunotherapy. --he underwent segment 8 Y90 on 07/22/21 and 06/15/2022  -he had good response to therapies  -Reviewed his restaging CT 09/15/2022 showed stable treated liver lesion, and a enlarging liver lesion in segment VIII (from 4mm to 8mm, LI-RADS category 4) -I personally reviewed his restaging MRI from last week, which showed a stable treated liver lesion, previously LI-RADS [name] category 4 lesion in right lobe of liver is overall stable in size, no definitive washout and enhancing capsule, it is considered LI RADS 3 now.  No additional new lesion. -Patient has developed significant intermittent pain in the right upper quadrant of abdomen, exam showed enlarged left lobe of liver, mild tenderness. -Restaging liver MRI on March 30, 2023 showed stable disease, no new lesions. -His tumor marker AFP remains to be negative (initially at 1479) -given no evidence of disease progression, will continue observation. -will repeat liver MRI in  3 months   Fatigue and weight loss -Patient noticed fatigue and 7 to 8 pound weight loss in the past months, he also has multiple skin infection on his face and neck, and the urinary frequency. -His random blood sugar is 405 today, I think his symptoms are related to his uncontrolled hyperglycemia -He has follow-up with his primary care physician next week to adjust to his diabetic medicine.   PLAN: -lab reviewed. -I informed patient that his blood sugar is very high, which is likely cause of his fatigue and weight loss.  He will follow-up with his primary care physician next week -AFP was not done today here, he is going to have lab drawn for his PCPs visit later today, I gave him a paper order of AFP to be drawn over there -Follow-up in 3 months with lab and abdominal MRI with and without contrast a week before -Continue liver cancer surveillance  SUMMARY OF ONCOLOGIC HISTORY: Oncology History Overview Note  Cancer Staging Hepatocellular carcinoma [facility]) Staging form: Liver, AJCC 8th Edition - Clinical stage from 03/08/2021: Stage IIIA (cT3, cN0, cM0) - Signed by Malachy Mood, MD on 03/11/2021 Stage prefix: Initial diagnosis Histologic grade (G): G2 Histologic grading system: 4 grade system     Hepatocellular carcinoma (HCC)   Initial Diagnosis   Liver mass, right lobe   03/05/2021 Imaging   CT CAP  IMPRESSION: 1. Right hepatic lobe masses which are suspicious for either metastatic disease or primary malignancy such as hepatocellular carcinoma. These are suboptimally evaluated on this noncontrast exam. Consider multidisciplinary oncology consultation with potential clinical strategies of tissue sampling versus PET to direct sampling. 2. No primary malignancy identified within  the chest, abdomen, or pelvis, given limitation of noncontrast exam. 3. Left nephrolithiasis. 4. Coronary artery atherosclerosis. Aortic Atherosclerosis (ICD10-I70.0). 5. Esophageal air fluid level  suggests dysmotility or gastroesophageal reflux.   03/06/2021 Imaging   MRI Liver  IMPRESSION: 9.5 cm heterogeneous hypovascular mass in anterior right hepatic lobe, and 1.3 cm hypervascular mass in the posterior right hepatic lobe. Differential diagnosis includes liver metastases and multifocal hepatocellular carcinoma. Suggest correlation with serum AFP level, and possible tissue sampling.   No evidence of abdominal metastatic disease.   03/08/2021 Initial Biopsy   A. LIVER, RIGHT, BIOPSY:  - Hepatocellular carcinoma.   COMMENT:  The hepatocellular carcinoma is moderately differentiated and associated with areas of necrosis.  Immunohistochemistry shows positivity with glypican-3 and weak staining with HepPar 1.  The tumor is negative with arginase 1, alpha-fetoprotein, CDX2, cytokeratin 7, cytokeratin 20, cytokeratin AE1/AE3, PSA, prostein and MOC31.  Polyclonal CEA shows focal canalicular pattern.  The morphology and immunophenotype are consistent with hepatocellular carcinoma.    03/08/2021 Cancer Staging   Staging form: Liver, AJCC 8th Edition - Clinical stage from 03/08/2021: Stage IIIA (cT3, cN0, cM0) - Signed by Malachy Mood, MD on 03/11/2021 Stage prefix: Initial diagnosis Histologic grade (G): G2 Histologic grading system: 4 grade system   04/28/2021 - 10/13/2021 Chemotherapy   Patient is on Treatment Plan : LUNG Atezolizumab + Bevacizumab q21d Maintenance     06/16/2021 Imaging   CT Abdomen  IMPRESSION: Marked interval decrease in size of the large RIGHT hepatic lobe lesion, biopsy-proven hepatocellular carcinoma. No arterial phase enhancement on early phase, vague enhancement on later phase greater than 20 Hounsfield unit enhancement on venous phase. Less pronounced arterial enhancement than on previous imaging with small peripheral foci of enhancement on arterial phase particularly along the margin of the tumor near the dome of the liver (image 12/2) 11 mm focus of enhancement  in this area.   Interval response to therapy of a small lesion previously compatible with LI-RADS category 5 lesion in the posterior RIGHT hepatic lobe.   No new hepatic lesion.   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   Signs of nephrolithiasis calculus on the LEFT measuring approximately 7 mm.   Colonic diverticulosis without evidence of acute diverticulitis.   Aortic Atherosclerosis (ICD10-I70.0).   11/02/2021 Imaging   EXAM: CT ABDOMEN AND PELVIS WITH CONTRAST   IMPRESSION: 1. Right hepatic mass has minimally decreased in size. No new focal hepatic masses are seen. 2. New small amount of ascites in the right upper quadrant. 3. Wall thickening of the cecum and ascending colon versus normal under distension. Correlate for nonspecific colitis. 4. Nonobstructing left renal calculus. 5.  Aortic Atherosclerosis (ICD10-I70.0).   09/15/2022 Imaging   IMPRESSION: 1. No significant change in post treatment appearance of hepatic segment VIII, with a well circumscribed, intrinsically T1 hyperintense lesion of the liver dome as well as additional heterogeneous, intrinsic T1 hyperintensity just superiorly. LI-RADS TR, nonviable. 2. Following radioembolization, new background parenchymal hyperenhancement of hepatic segment VII, with faint rim enhancing, although internally nonenhancing lesions in the posterior liver dome corresponding to previously arterially hyperenhancing liver lesions. This is consistent with post treatment hyperemia without evidence of residual viable tumor. LI-RADS TR, nonviable. 3. Interval enlargement of an arterially hyperenhancing lesion in the anterior right lobe of the liver, near the margin of hepatic segment VIII, measuring 0.8 x 0.8 cm, previously no greater than 0.4 cm. This does not demonstrate clear evidence of washout or capsular enhancement. LI-RADS category 4  due to threshold growth.      Imaging     09/15/2022 Imaging     IMPRESSION: 1. No significant change in post treatment appearance of hepatic segment VIII, with a well circumscribed, intrinsically T1 hyperintense lesion of the liver dome as well as additional heterogeneous, intrinsic T1 hyperintensity just superiorly. LI-RADS TR, nonviable. 2. Following radioembolization, new background parenchymal hyperenhancement of hepatic segment VII, with faint rim enhancing, although internally nonenhancing lesions in the posterior liver dome corresponding to previously arterially hyperenhancing liver lesions. This is consistent with post treatment hyperemia without evidence of residual viable tumor. LI-RADS TR, nonviable. 3. Interval enlargement of an arterially hyperenhancing lesion in the anterior right lobe of the liver, near the margin of hepatic segment VIII, measuring 0.8 x 0.8 cm, previously no greater than 0.4 cm. This does not demonstrate clear evidence of washout or capsular enhancement. LI-RADS category 4 due to threshold growth.     12/18/2022 Imaging    IMPRESSION: 1. Stable appearance of the previously treated segment VIII lesion. LI-RADS TR, nonviable. 2. Stable appearance of the index lesions in the posterior right hepatic dome. As before, there is early heterogeneous but relatively diffuse enhancement in the posterior right hepatic dome, likely treatment related. Index lesions documented previously are similar in size and enhancement characteristics without suspicious enhancement characteristics. LI-RADS TR, nonviable. 3. Small lesion showing arterial phase hyperenhancement in the anterior right liver (segment II right) persists. As before there is no definite washout and no enhancing capsule associated with this lesion. Given lack of interval growth, this lesion is downgraded to LI-RADS 3. Close continued follow-up recommended. 4. No new or progressive findings in the abdomen.        INTERVAL HISTORY:  Currie Hedrick is  here for a follow up of multifocal HCC . He was last seen by me on 04/06/2023. He presents to the clinic accompanied by his ex-wife and her husband.  He has been feeling fatigued, in the last 7 to 8 pounds in the past months, with urinary frequency and some skin infection on his face and neck.  He had a few episodes of epigastric pain which resolved spontaneously, bowel movement is normal, he has 2 bowel movements a day usually.  He does have urinary frequency but no dysuria.   All other systems were reviewed with the patient and are negative.  MEDICAL HISTORY:  Past Medical History:  Diagnosis Date   Depression    Hepatocellular carcinoma (HCC) 03/08/2021   Parasite infestation     SURGICAL HISTORY: Past Surgical History:  Procedure Laterality Date   IR 3D INDEPENDENT WKST  05/25/2022   IR ANGIOGRAM SELECTIVE EACH ADDITIONAL VESSEL  07/01/2021   IR ANGIOGRAM SELECTIVE EACH ADDITIONAL VESSEL  07/22/2021   IR ANGIOGRAM SELECTIVE EACH ADDITIONAL VESSEL  07/22/2021   IR ANGIOGRAM SELECTIVE EACH ADDITIONAL VESSEL  07/22/2021   IR ANGIOGRAM SELECTIVE EACH ADDITIONAL VESSEL  07/22/2021   IR ANGIOGRAM SELECTIVE EACH ADDITIONAL VESSEL  05/25/2022   IR ANGIOGRAM SELECTIVE EACH ADDITIONAL VESSEL  05/25/2022   IR ANGIOGRAM SELECTIVE EACH ADDITIONAL VESSEL  05/25/2022   IR ANGIOGRAM SELECTIVE EACH ADDITIONAL VESSEL  05/25/2022   IR ANGIOGRAM SELECTIVE EACH ADDITIONAL VESSEL  05/25/2022   IR ANGIOGRAM SELECTIVE EACH ADDITIONAL VESSEL  05/25/2022   IR ANGIOGRAM SELECTIVE EACH ADDITIONAL VESSEL  06/15/2022   IR ANGIOGRAM SELECTIVE EACH ADDITIONAL VESSEL  06/15/2022   IR ANGIOGRAM SELECTIVE EACH ADDITIONAL VESSEL  06/15/2022   IR ANGIOGRAM VISCERAL SELECTIVE  07/01/2021   IR  ANGIOGRAM VISCERAL SELECTIVE  07/22/2021   IR ANGIOGRAM VISCERAL SELECTIVE  05/25/2022   IR ANGIOGRAM VISCERAL SELECTIVE  06/15/2022   IR EMBO ARTERIAL NOT HEMORR HEMANG INC GUIDE ROADMAPPING  07/01/2021   IR EMBO TUMOR ORGAN ISCHEMIA INFARCT INC GUIDE  ROADMAPPING  07/22/2021   IR EMBO TUMOR ORGAN ISCHEMIA INFARCT INC GUIDE ROADMAPPING  06/15/2022   IR RADIOLOGIST EVAL & MGMT  06/01/2021   IR RADIOLOGIST EVAL & MGMT  04/26/2022   IR RADIOLOGIST EVAL & MGMT  07/12/2022   IR US GUIDE VASC ACCESS RIGHT  07/01/2021   IR US GUIDE VASC ACCESS RIGHT  07/22/2021   IR US GUIDE VASC ACCESS RIGHT  05/25/2022   IR US GUIDE VASC ACCESS RIGHT  06/15/2022   NO PAST SURGERIES      I have reviewed the social history and family history with the patient and they are unchanged from previous note.  ALLERGIES:  has No Known Allergies.  MEDICATIONS:  Current Outpatient Medications  Medication Sig Dispense Refill   pantoprazole (PROTONIX) 40 MG tablet Take 1 tablet (40 mg total) by mouth daily. 30 tablet 1   Ascorbic Acid (VITAMIN C) 100 MG tablet Take 100 mg by mouth daily.     blood glucose meter kit and supplies Dispense based on patient and insurance preference. Use up to four times daily as directed. (FOR ICD-10 E10.9, E11.9). 1 each 0   HYDROcodone-acetaminophen (NORCO) 5-325 MG tablet Take 1 tablet by mouth every 12 (twelve) hours as needed for moderate pain. 45 tablet 0   insulin glargine (LANTUS) 100 UNIT/ML injection 40 Units daily.     Insulin Pen Needle (PEN NEEDLES 3/16") 31G X 5 MM MISC 1 each by Does not apply route with breakfast, with lunch, and with evening meal. 100 each 3   Multiple Vitamin (MULTIVITAMIN WITH MINERALS) TABS tablet Take 1 tablet by mouth daily.     ondansetron (ZOFRAN) 8 MG tablet Take 1 tablet (8 mg total) by mouth every 8 (eight) hours as needed for nausea or vomiting. 30 tablet 0   polyethylene glycol (MIRALAX / GLYCOLAX) 17 g packet Take 17 g by mouth daily as needed for mild constipation. 14 each 0   prochlorperazine (COMPAZINE) 10 MG tablet Take 1 tablet (10 mg total) by mouth every 6 (six) hours as needed for nausea or vomiting. 30 tablet 0   senna (SENOKOT) 8.6 MG TABS tablet Take 1 tablet (8.6 mg total) by mouth 2 (two)  times daily. (Patient taking differently: Take 1 tablet by mouth 2 (two) times daily as needed for moderate constipation.) 120 tablet 0   vitamin B-12 (CYANOCOBALAMIN) 100 MCG tablet Take 100 mcg by mouth daily.     No current facility-administered medications for this visit.   Facility-Administered Medications Ordered in Other Visits  Medication Dose Route Frequency Provider Last Rate Last Admin   0.9 %  sodium chloride infusion   Intravenous Continuous Malachy Mood, MD        PHYSICAL EXAMINATION: ECOG PERFORMANCE STATUS: 1 - Symptomatic but completely ambulatory  Vitals:   06/28/23 1300  BP: 139/82  Pulse: 72  Resp: 16  Temp: 97.8 F (36.6 C)  SpO2: 100%   Wt Readings from Last 3 Encounters:  06/28/23 123 lb 11.2 oz (56.1 kg)  04/06/23 128 lb 9.6 oz (58.3 kg)  03/20/23 130 lb 1.6 oz (59 kg)     GENERAL:alert, no distress and comfortable SKIN: skin color normal, no rashes or significant lesions EYES: normal, Conjunctiva  are pink and non-injected, sclera clear  Chest: Clear to auscultation, no rales.  Heart regular Abdomen soft, nontender, slightly enlarged liver, no tenderness. NEURO: alert & oriented x 3 with fluent speech   LABORATORY DATA:  I have reviewed the data as listed    Latest Ref Rng & Units 06/28/2023   11:29 AM 03/20/2023   10:16 AM 12/19/2022   10:53 AM  CBC  WBC 4.0 - 10.5 K/uL 4.4  4.6  4.3   Hemoglobin 13.0 - 17.0 g/dL 16.1  09.6  04.5   Hematocrit 39.0 - 52.0 % 39.5  36.6  36.8   Platelets 150 - 400 K/uL 130  96  102         Latest Ref Rng & Units 06/28/2023   11:29 AM 03/20/2023   10:16 AM 12/19/2022   10:53 AM  CMP  Glucose 70 - 99 mg/dL 409  811  914   BUN 8 - 23 mg/dL 16  14  16    Creatinine 0.61 - 1.24 mg/dL 7.82  9.56  2.13   Sodium 135 - 145 mmol/L 130  132  132   Potassium 3.5 - 5.1 mmol/L 4.8  4.5  4.2   Chloride 98 - 111 mmol/L 97  100  100   CO2 22 - 32 mmol/L 29  27  28    Calcium 8.9 - 10.3 mg/dL 9.7  9.2  9.4   Total Protein 6.5 -  8.1 g/dL 7.8  7.1  7.5   Total Bilirubin 0.3 - 1.2 mg/dL 1.1  0.6  0.7   Alkaline Phos 38 - 126 U/L 57  58  61   AST 15 - 41 U/L 32  35  31   ALT 0 - 44 U/L 26  27  26        RADIOGRAPHIC STUDIES: I have personally reviewed the radiological images as listed and agreed with the findings in the report. No results found.    Orders Placed This Encounter  Procedures   MR Abdomen W Wo Contrast    Standing Status:   Future    Standing Expiration Date:   06/27/2024    Order Specific Question:   If indicated for the ordered procedure, I authorize the administration of contrast media per Radiology protocol    Answer:   Yes    Order Specific Question:   What is the patient's sedation requirement?    Answer:   No Sedation    Order Specific Question:   Does the patient have a pacemaker or implanted devices?    Answer:   No    Order Specific Question:   Preferred imaging location?    Answer:   [facility] (table limit - 550 lbs)   AFP tumor marker    Standing Status:   Standing    Number of Occurrences:   20    Standing Expiration Date:   06/27/2024   All questions were answered. The patient knows to call the clinic with any problems, questions or concerns. No barriers to learning was detected. The total time spent in the appointment was 30 minutes.     Malachy Mood, MD 06/28/2023

## 2023-07-04 ENCOUNTER — Encounter: Payer: Self-pay | Admitting: Hematology

## 2023-09-19 ENCOUNTER — Ambulatory Visit (HOSPITAL_COMMUNITY)
Admission: RE | Admit: 2023-09-19 | Discharge: 2023-09-19 | Disposition: A | Discharge status: Home / Self Care | Payer: 59 | Source: Ambulatory Visit | Attending: Hematology | Admitting: Hematology

## 2023-09-19 ENCOUNTER — Inpatient Hospital Stay: Payer: 59 | Attending: Hematology

## 2023-09-19 DIAGNOSIS — K7460 Unspecified cirrhosis of liver: Secondary | ICD-10-CM | POA: Diagnosis not present

## 2023-09-19 DIAGNOSIS — C220 Liver cell carcinoma: Secondary | ICD-10-CM | POA: Insufficient documentation

## 2023-09-19 DIAGNOSIS — E1065 Type 1 diabetes mellitus with hyperglycemia: Secondary | ICD-10-CM | POA: Diagnosis not present

## 2023-09-19 DIAGNOSIS — N281 Cyst of kidney, acquired: Secondary | ICD-10-CM | POA: Insufficient documentation

## 2023-09-19 DIAGNOSIS — B1920 Unspecified viral hepatitis C without hepatic coma: Secondary | ICD-10-CM | POA: Diagnosis not present

## 2023-09-19 DIAGNOSIS — G8929 Other chronic pain: Secondary | ICD-10-CM | POA: Insufficient documentation

## 2023-09-19 DIAGNOSIS — K5730 Diverticulosis of large intestine without perforation or abscess without bleeding: Secondary | ICD-10-CM | POA: Insufficient documentation

## 2023-09-19 LAB — CMP (CANCER CENTER ONLY)
ALT: 27 U/L (ref 0–44)
AST: 34 U/L (ref 15–41)
Albumin: 4.1 g/dL (ref 3.5–5.0)
Alkaline Phosphatase: 56 U/L (ref 38–126)
Anion gap: 5 (ref 5–15)
BUN: 14 mg/dL (ref 8–23)
CO2: 26 mmol/L (ref 22–32)
Calcium: 9.7 mg/dL (ref 8.9–10.3)
Chloride: 99 mmol/L (ref 98–111)
Creatinine: 0.86 mg/dL (ref 0.61–1.24)
GFR, Estimated: >60 mL/min (ref >60)
Glucose, Bld: 401 mg/dL — ABNORMAL HIGH (ref 70–99)
Potassium: 4.6 mmol/L (ref 3.5–5.1)
Sodium: 130 mmol/L — ABNORMAL LOW (ref 135–145)
Total Bilirubin: 0.8 mg/dL (ref <1.2)
Total Protein: 7.7 g/dL (ref 6.5–8.1)

## 2023-09-19 LAB — CBC WITH DIFFERENTIAL (CANCER CENTER ONLY)
Abs Immature Granulocytes: 0.01 10*3/uL (ref 0.00–0.07)
Basophils Absolute: 0 10*3/uL (ref 0.0–0.1)
Basophils Relative: 1 %
Eosinophils Absolute: 0.1 10*3/uL (ref 0.0–0.5)
Eosinophils Relative: 1 %
HCT: 38.4 % — ABNORMAL LOW (ref 39.0–52.0)
Hemoglobin: 13.6 g/dL (ref 13.0–17.0)
Immature Granulocytes: 0 %
Lymphocytes Relative: 19 %
Lymphs Abs: 0.9 10*3/uL (ref 0.7–4.0)
MCH: 31 pg (ref 26.0–34.0)
MCHC: 35.4 g/dL (ref 30.0–36.0)
MCV: 87.5 fL (ref 80.0–100.0)
Monocytes Absolute: 0.5 10*3/uL (ref 0.1–1.0)
Monocytes Relative: 10 %
Neutro Abs: 3.2 10*3/uL (ref 1.7–7.7)
Neutrophils Relative %: 69 %
Platelet Count: 114 10*3/uL — ABNORMAL LOW (ref 150–400)
RBC: 4.39 MIL/uL (ref 4.22–5.81)
RDW: 12.3 % (ref 11.5–15.5)
WBC Count: 4.6 10*3/uL (ref 4.0–10.5)
nRBC: 0 % (ref 0.0–0.2)

## 2023-09-19 MED ORDER — GADOBUTROL 1 MMOL/ML IV SOLN
6.0000 mL | Freq: Once | INTRAVENOUS | Status: AC | PRN
  Administered 2023-09-19: 6 mL via INTRAVENOUS

## 2023-09-20 LAB — AFP TUMOR MARKER: AFP, Serum, Tumor Marker: 2.1 ng/mL (ref 0.0–8.4)

## 2023-09-25 NOTE — Assessment & Plan Note (Addendum)
cT3N0M, stage IIIA    -diagnosed in 02/2021, liver biopsy 03/08/21 confirmed moderately differentiated hepatocellular carcinoma. No nodal or distant metastasis on CT 03/05/21.  --He is not a candidate for liver resection or transplant. Dr. Gwenlyn Perking at [facility] did not recommend surgery given the location and extent of disease. -He started atezolizumab and bevacizumab on 04/28/21, held after 09/2021 due to newly diagnosed type 1 DM with hyperglycemia which was probably related to immunotherapy. --he underwent segment 8 Y90 on 07/22/21 and 06/15/2022  -he had good response to therapies  -Reviewed his restaging CT 09/15/2022 showed stable treated liver lesion, and a enlarging liver lesion in segment VIII (from 4mm to 8mm, LI-RADS category 4) -I personally reviewed his restaging MRI from last week, which showed a stable treated liver lesion, previously LI-RADS [name] category 4 lesion in right lobe of liver is overall stable in size, no definitive washout and enhancing capsule, it is considered LI RADS 3 now.  No additional new lesion. -Patient has developed significant intermittent pain in the right upper quadrant of abdomen, exam showed enlarged left lobe of liver, mild tenderness. -Restaging liver MRI on March 30, 2023 showed stable disease, no new lesions. -His tumor marker AFP remains to be negative (initially at 1479) -Repeated MRI from October 16, 2023 showed stable post embolization appearance of multiple liver lesions, and a slightly enlargement of in arterially hyperenhancing lesion in segment 4A measuring 1.3 cm.  I will reach out to IR to see if they plan to ablate this lesion.  His AFP however has been normal.

## 2023-09-26 ENCOUNTER — Inpatient Hospital Stay (HOSPITAL_BASED_OUTPATIENT_CLINIC_OR_DEPARTMENT_OTHER): Payer: 59 | Admitting: Hematology

## 2023-09-26 ENCOUNTER — Ambulatory Visit: Payer: 59 | Admitting: Hematology

## 2023-09-26 ENCOUNTER — Encounter: Payer: Self-pay | Admitting: Hematology

## 2023-09-26 VITALS — BP 150/80 | HR 67 | Temp 98.1°F | Resp 15 | Wt 128.5 lb

## 2023-09-26 DIAGNOSIS — C220 Liver cell carcinoma: Secondary | ICD-10-CM

## 2023-09-26 NOTE — Progress Notes (Signed)
[facility]   Telephone:(336) [phone] Fax:(336) [phone]   Clinic Follow up Note   Patient Care Team: Shawn Livings, MD as PCP - General (Internal Medicine) Malachy Mood, MD as Consulting Physician (Hematology and Oncology) Radonna Ricker, RN (Inactive) as Oncology Nurse Navigator  Date of Service:  09/26/2023  CHIEF COMPLAINT: f/u of [facility]  CURRENT THERAPY:  Surveillance  Oncology History   Hepatocellular carcinoma (HCC) cT3N0M, stage IIIA    -diagnosed in 02/2021, liver biopsy 03/08/21 confirmed moderately differentiated hepatocellular carcinoma. No nodal or distant metastasis on CT 03/05/21.  --He is not a candidate for liver resection or transplant. Dr. Gwenlyn Perking at [facility] did not recommend surgery given the location and extent of disease. -He started atezolizumab and bevacizumab on 04/28/21, held after 09/2021 due to newly diagnosed type 1 DM with hyperglycemia which was probably related to immunotherapy. --he underwent segment 8 Y90 on 07/22/21 and 06/15/2022  -he had good response to therapies  -Reviewed his restaging CT 09/15/2022 showed stable treated liver lesion, and a enlarging liver lesion in segment VIII (from 4mm to 8mm, LI-RADS category 4) -I personally reviewed his restaging MRI from last week, which showed a stable treated liver lesion, previously LI-RADS [name] category 4 lesion in right lobe of liver is overall stable in size, no definitive washout and enhancing capsule, it is considered LI RADS 3 now.  No additional new lesion. -Patient has developed significant intermittent pain in the right upper quadrant of abdomen, exam showed enlarged left lobe of liver, mild tenderness. -Restaging liver MRI on March 30, 2023 showed stable disease, no new lesions. -His tumor marker AFP remains to be negative (initially at 1479) -Repeated MRI from October 16, 2023 showed stable post embolization appearance of multiple liver lesions, and a slightly enlargement of in  arterially hyperenhancing lesion in segment 4A measuring 1.3 cm.  I will reach out to IR to see if they plan to ablate this lesion.  His AFP however has been normal.   Assessment and Plan    Hepatocellular Carcinoma (HCC) MRI shows the larger lesion is stable, but a smaller lesion has increased from 0.9 cm to 1.3 cm, suggesting progression. Tumor markers remain normal. Discussed potential treatments including Y90 or ablation. Explained that ablation involves direct needle insertion into the tumor, potentially less burdensome than systemic medication. - Send MRI results to Dr. Archer Bailey for evaluation of the smaller lesion - Await response from Dr. Archer Bailey regarding potential Y90 treatment or ablation - Schedule follow-up appointment in two months - Order follow-up scan at next visit  Chronic Pain Chronic body/join pain managed by PCP with oxycodone, taken daily. Pain is primarily in the morning and relieved by medication. No specific diagnosis of arthritis. Pain management is crucial for maintaining quality of life and daily activities. - Continue current pain management with oxycodone as prescribed by primary care physician  Hep C, untreated, liver cirrhosis  -This was discovered when he was found to have HCC. -I referrred him to GI Dr. Elnoria Howard    Type 1 DM -on insulin, f/u with PCP   Follow-up -I messaged Dr. Archer Bailey to see if he recommends [facility] ablation, waiting for response  - Schedule next appointment and lab in two months.         SUMMARY OF ONCOLOGIC HISTORY: Oncology History Overview Note  Cancer Staging Hepatocellular carcinoma [facility]) Staging form: Liver, AJCC 8th Edition - Clinical stage from 03/08/2021: Stage IIIA (cT3, cN0, cM0) - Signed by Malachy Mood, MD on 03/11/2021  Stage prefix: Initial diagnosis Histologic grade (G): G2 Histologic grading system: 4 grade system     Hepatocellular carcinoma (HCC)   Initial Diagnosis   Liver mass, right lobe   03/05/2021  Imaging   CT CAP  IMPRESSION: 1. Right hepatic lobe masses which are suspicious for either metastatic disease or primary malignancy such as hepatocellular carcinoma. These are suboptimally evaluated on this noncontrast exam. Consider multidisciplinary oncology consultation with potential clinical strategies of tissue sampling versus PET to direct sampling. 2. No primary malignancy identified within the chest, abdomen, or pelvis, given limitation of noncontrast exam. 3. Left nephrolithiasis. 4. Coronary artery atherosclerosis. Aortic Atherosclerosis (ICD10-I70.0). 5. Esophageal air fluid level suggests dysmotility or gastroesophageal reflux.   03/06/2021 Imaging   MRI Liver  IMPRESSION: 9.5 cm heterogeneous hypovascular mass in anterior right hepatic lobe, and 1.3 cm hypervascular mass in the posterior right hepatic lobe. Differential diagnosis includes liver metastases and multifocal hepatocellular carcinoma. Suggest correlation with serum AFP level, and possible tissue sampling.   No evidence of abdominal metastatic disease.   03/08/2021 Initial Biopsy   A. LIVER, RIGHT, BIOPSY:  - Hepatocellular carcinoma.   COMMENT:  The hepatocellular carcinoma is moderately differentiated and associated with areas of necrosis.  Immunohistochemistry shows positivity with glypican-3 and weak staining with HepPar 1.  The tumor is negative with arginase 1, alpha-fetoprotein, CDX2, cytokeratin 7, cytokeratin 20, cytokeratin AE1/AE3, PSA, prostein and MOC31.  Polyclonal CEA shows focal canalicular pattern.  The morphology and immunophenotype are consistent with hepatocellular carcinoma.    03/08/2021 Cancer Staging   Staging form: Liver, AJCC 8th Edition - Clinical stage from 03/08/2021: Stage IIIA (cT3, cN0, cM0) - Signed by Malachy Mood, MD on 03/11/2021 Stage prefix: Initial diagnosis Histologic grade (G): G2 Histologic grading system: 4 grade system   04/28/2021 - 10/13/2021 Chemotherapy    Patient is on Treatment Plan : LUNG Atezolizumab + Bevacizumab q21d Maintenance     06/16/2021 Imaging   CT Abdomen  IMPRESSION: Marked interval decrease in size of the large RIGHT hepatic lobe lesion, biopsy-proven hepatocellular carcinoma. No arterial phase enhancement on early phase, vague enhancement on later phase greater than 20 Hounsfield unit enhancement on venous phase. Less pronounced arterial enhancement than on previous imaging with small peripheral foci of enhancement on arterial phase particularly along the margin of the tumor near the dome of the liver (image 12/2) 11 mm focus of enhancement in this area.   Interval response to therapy of a small lesion previously compatible with LI-RADS category 5 lesion in the posterior RIGHT hepatic lobe.   No new hepatic lesion.   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   Signs of nephrolithiasis calculus on the LEFT measuring approximately 7 mm.   Colonic diverticulosis without evidence of acute diverticulitis.   Aortic Atherosclerosis (ICD10-I70.0).   11/02/2021 Imaging   EXAM: CT ABDOMEN AND PELVIS WITH CONTRAST   IMPRESSION: 1. Right hepatic mass has minimally decreased in size. No new focal hepatic masses are seen. 2. New small amount of ascites in the right upper quadrant. 3. Wall thickening of the cecum and ascending colon versus normal under distension. Correlate for nonspecific colitis. 4. Nonobstructing left renal calculus. 5.  Aortic Atherosclerosis (ICD10-I70.0).   09/15/2022 Imaging   IMPRESSION: 1. No significant change in post treatment appearance of hepatic segment VIII, with a well circumscribed, intrinsically T1 hyperintense lesion of the liver dome as well as additional heterogeneous, intrinsic T1 hyperintensity just superiorly. LI-RADS TR, nonviable. 2. Following radioembolization, new background  parenchymal hyperenhancement of hepatic segment VII, with faint rim  enhancing, although internally nonenhancing lesions in the posterior liver dome corresponding to previously arterially hyperenhancing liver lesions. This is consistent with post treatment hyperemia without evidence of residual viable tumor. LI-RADS TR, nonviable. 3. Interval enlargement of an arterially hyperenhancing lesion in the anterior right lobe of the liver, near the margin of hepatic segment VIII, measuring 0.8 x 0.8 cm, previously no greater than 0.4 cm. This does not demonstrate clear evidence of washout or capsular enhancement. LI-RADS category 4 due to threshold growth.      Imaging     09/15/2022 Imaging    IMPRESSION: 1. No significant change in post treatment appearance of hepatic segment VIII, with a well circumscribed, intrinsically T1 hyperintense lesion of the liver dome as well as additional heterogeneous, intrinsic T1 hyperintensity just superiorly. LI-RADS TR, nonviable. 2. Following radioembolization, new background parenchymal hyperenhancement of hepatic segment VII, with faint rim enhancing, although internally nonenhancing lesions in the posterior liver dome corresponding to previously arterially hyperenhancing liver lesions. This is consistent with post treatment hyperemia without evidence of residual viable tumor. LI-RADS TR, nonviable. 3. Interval enlargement of an arterially hyperenhancing lesion in the anterior right lobe of the liver, near the margin of hepatic segment VIII, measuring 0.8 x 0.8 cm, previously no greater than 0.4 cm. This does not demonstrate clear evidence of washout or capsular enhancement. LI-RADS category 4 due to threshold growth.     12/18/2022 Imaging    IMPRESSION: 1. Stable appearance of the previously treated segment VIII lesion. LI-RADS TR, nonviable. 2. Stable appearance of the index lesions in the posterior right hepatic dome. As before, there is early heterogeneous but relatively diffuse enhancement in the posterior  right hepatic dome, likely treatment related. Index lesions documented previously are similar in size and enhancement characteristics without suspicious enhancement characteristics. LI-RADS TR, nonviable. 3. Small lesion showing arterial phase hyperenhancement in the anterior right liver (segment II right) persists. As before there is no definite washout and no enhancing capsule associated with this lesion. Given lack of interval growth, this lesion is downgraded to LI-RADS 3. Close continued follow-up recommended. 4. No new or progressive findings in the abdomen.        Discussed the use of AI scribe software for clinical note transcription with the patient, who gave verbal consent to proceed.  History of Present Illness   Shawn Bailey, a 77 year old gentleman with a history of HCC, presents for a follow-up visit. He reports intermittent abdominal pain, particularly in the mornings. The pain is managed with daily oxycodone, which he takes for generalized body pain. He denies any pain in the liver area and does not believe he has arthritis. He remains active, engaging in regular exercise and woodwork. He also reports a persistent dryness in his nose, which sometimes results in a peculiar smell. He has been using saline and VapoRub to manage this symptom.         All other systems were reviewed with the patient and are negative.  MEDICAL HISTORY:  Past Medical History:  Diagnosis Date   Depression    Hepatocellular carcinoma (HCC) 03/08/2021   Parasite infestation     SURGICAL HISTORY: Past Surgical History:  Procedure Laterality Date   IR 3D INDEPENDENT WKST  05/25/2022   IR ANGIOGRAM SELECTIVE EACH ADDITIONAL VESSEL  07/01/2021   IR ANGIOGRAM SELECTIVE EACH ADDITIONAL VESSEL  07/22/2021   IR ANGIOGRAM SELECTIVE EACH ADDITIONAL VESSEL  07/22/2021   IR ANGIOGRAM SELECTIVE EACH  ADDITIONAL VESSEL  07/22/2021   IR ANGIOGRAM SELECTIVE EACH ADDITIONAL VESSEL  07/22/2021   IR ANGIOGRAM  SELECTIVE EACH ADDITIONAL VESSEL  05/25/2022   IR ANGIOGRAM SELECTIVE EACH ADDITIONAL VESSEL  05/25/2022   IR ANGIOGRAM SELECTIVE EACH ADDITIONAL VESSEL  05/25/2022   IR ANGIOGRAM SELECTIVE EACH ADDITIONAL VESSEL  05/25/2022   IR ANGIOGRAM SELECTIVE EACH ADDITIONAL VESSEL  05/25/2022   IR ANGIOGRAM SELECTIVE EACH ADDITIONAL VESSEL  05/25/2022   IR ANGIOGRAM SELECTIVE EACH ADDITIONAL VESSEL  06/15/2022   IR ANGIOGRAM SELECTIVE EACH ADDITIONAL VESSEL  06/15/2022   IR ANGIOGRAM SELECTIVE EACH ADDITIONAL VESSEL  06/15/2022   IR ANGIOGRAM VISCERAL SELECTIVE  07/01/2021   IR ANGIOGRAM VISCERAL SELECTIVE  07/22/2021   IR ANGIOGRAM VISCERAL SELECTIVE  05/25/2022   IR ANGIOGRAM VISCERAL SELECTIVE  06/15/2022   IR EMBO ARTERIAL NOT HEMORR HEMANG INC GUIDE ROADMAPPING  07/01/2021   IR EMBO TUMOR ORGAN ISCHEMIA INFARCT INC GUIDE ROADMAPPING  07/22/2021   IR EMBO TUMOR ORGAN ISCHEMIA INFARCT INC GUIDE ROADMAPPING  06/15/2022   IR RADIOLOGIST EVAL & MGMT  06/01/2021   IR RADIOLOGIST EVAL & MGMT  04/26/2022   IR RADIOLOGIST EVAL & MGMT  07/12/2022   IR US GUIDE VASC ACCESS RIGHT  07/01/2021   IR US GUIDE VASC ACCESS RIGHT  07/22/2021   IR US GUIDE VASC ACCESS RIGHT  05/25/2022   IR US GUIDE VASC ACCESS RIGHT  06/15/2022   NO PAST SURGERIES      I have reviewed the social history and family history with the patient and they are unchanged from previous note.  ALLERGIES:  has No Known Allergies.  MEDICATIONS:  Current Outpatient Medications  Medication Sig Dispense Refill   Ascorbic Acid (VITAMIN C) 100 MG tablet Take 100 mg by mouth daily.     blood glucose meter kit and supplies Dispense based on patient and insurance preference. Use up to four times daily as directed. (FOR ICD-10 E10.9, E11.9). 1 each 0   insulin glargine (LANTUS) 100 UNIT/ML injection 40 Units daily.     Insulin Pen Needle (PEN NEEDLES 3/16") 31G X 5 MM MISC 1 each by Does not apply route with breakfast, with lunch, and with evening meal. 100 each 3    Multiple Vitamin (MULTIVITAMIN WITH MINERALS) TABS tablet Take 1 tablet by mouth daily.     ondansetron (ZOFRAN) 8 MG tablet Take 1 tablet (8 mg total) by mouth every 8 (eight) hours as needed for nausea or vomiting. 30 tablet 0   pantoprazole (PROTONIX) 40 MG tablet Take 1 tablet (40 mg total) by mouth daily. 30 tablet 1   polyethylene glycol (MIRALAX / GLYCOLAX) 17 g packet Take 17 g by mouth daily as needed for mild constipation. 14 each 0   prochlorperazine (COMPAZINE) 10 MG tablet Take 1 tablet (10 mg total) by mouth every 6 (six) hours as needed for nausea or vomiting. 30 tablet 0   senna (SENOKOT) 8.6 MG TABS tablet Take 1 tablet (8.6 mg total) by mouth 2 (two) times daily. (Patient taking differently: Take 1 tablet by mouth 2 (two) times daily as needed for moderate constipation.) 120 tablet 0   vitamin B-12 (CYANOCOBALAMIN) 100 MCG tablet Take 100 mcg by mouth daily.     No current facility-administered medications for this visit.   Facility-Administered Medications Ordered in Other Visits  Medication Dose Route Frequency Provider Last Rate Last Admin   0.9 %  sodium chloride infusion   Intravenous Continuous Malachy Mood, MD  PHYSICAL EXAMINATION: ECOG PERFORMANCE STATUS: 1 - Symptomatic but completely ambulatory  Vitals:   09/26/23 1443 09/26/23 1451  BP: (!) 158/80 (!) 150/80  Pulse:    Resp:    Temp:    SpO2:     Wt Readings from Last 3 Encounters:  09/26/23 128 lb 8 oz (58.3 kg)  06/28/23 123 lb 11.2 oz (56.1 kg)  04/06/23 128 lb 9.6 oz (58.3 kg)     GENERAL:alert, no distress and comfortable SKIN: skin color, texture, turgor are normal, no rashes or significant lesions EYES: normal, Conjunctiva are pink and non-injected, sclera clear NECK: supple, thyroid normal size, non-tender, without nodularity LYMPH:  no palpable lymphadenopathy in the cervical, axillary  LUNGS: clear to auscultation and percussion with normal breathing effort HEART: regular rate & rhythm  and no murmurs and no lower extremity edema ABDOMEN:abdomen soft, non-tender and normal bowel sounds Musculoskeletal:no cyanosis of digits and no clubbing  NEURO: alert & oriented x 3 with fluent speech, no focal motor/sensory deficits  LABORATORY DATA:  I have reviewed the data as listed    Latest Ref Rng & Units 09/19/2023   11:37 AM 06/28/2023   11:29 AM 03/20/2023   10:16 AM  CBC  WBC 4.0 - 10.5 K/uL 4.6  4.4  4.6   Hemoglobin 13.0 - 17.0 g/dL 40.9  81.1  91.4   Hematocrit 39.0 - 52.0 % 38.4  39.5  36.6   Platelets 150 - 400 K/uL 114  130  96         Latest Ref Rng & Units 09/19/2023   11:37 AM 06/28/2023   11:29 AM 03/20/2023   10:16 AM  CMP  Glucose 70 - 99 mg/dL 782  956  213   BUN 8 - 23 mg/dL 14  16  14    Creatinine 0.61 - 1.24 mg/dL 0.86  5.78  4.69   Sodium 135 - 145 mmol/L 130  130  132   Potassium 3.5 - 5.1 mmol/L 4.6  4.8  4.5   Chloride 98 - 111 mmol/L 99  97  100   CO2 22 - 32 mmol/L 26  29  27    Calcium 8.9 - 10.3 mg/dL 9.7  9.7  9.2   Total Protein 6.5 - 8.1 g/dL 7.7  7.8  7.1   Total Bilirubin <1.2 mg/dL 0.8  1.1  0.6   Alkaline Phos 38 - 126 U/L 56  57  58   AST 15 - 41 U/L 34  32  35   ALT 0 - 44 U/L 27  26  27        RADIOGRAPHIC STUDIES: I have personally reviewed the radiological images as listed and agreed with the findings in the report. No results found.    No orders of the defined types were placed in this encounter.  All questions were answered. The patient knows to call the clinic with any problems, questions or concerns. No barriers to learning was detected. The total time spent in the appointment was 25 minutes.     Malachy Mood, MD 09/26/2023

## 2023-10-02 ENCOUNTER — Other Ambulatory Visit: Payer: Self-pay | Admitting: Interventional Radiology

## 2023-10-02 ENCOUNTER — Telehealth: Payer: Self-pay

## 2023-10-02 DIAGNOSIS — C220 Liver cell carcinoma: Secondary | ICD-10-CM

## 2023-10-02 NOTE — Telephone Encounter (Signed)
[facility] return this nurse's call stating they've been trying to reach the pt but his voicemail box is not setup.  Recommended that they contact [name] since she contacted Dr. Latanya Maudlin office regarding the appt w/Dr. Archer Asa.  The scheduler stated she will contact [name] and that the pt will be scheduled on Thursday, 10/05/2023 @1300 .  Notified Dr. Mosetta Putt of the call.

## 2023-10-02 NOTE — Telephone Encounter (Signed)
[name] (pt's friend) called for a f/u w/Dr. Mosetta Putt regarding the referral to Dr. Malachy Moan regarding Y90 & possible liver ablation.  Dr. Mosetta Putt spoke with pt's with this nurse and stated the referral was sent to Dr. Archer Asa and she contacted Dr. Archer Asa regarding the referral.  Dr. Mosetta Putt stated she's still awaiting his response but have this nurse to contact Dr. Henri Medal office.  This nurse contacted Dr. Henri Medal office [phone]) regarding the referral from Dr. Mosetta Putt.  LVM requesting if someone would please contact Dr. Latanya Maudlin office regarding the referral for Y90 & liver ablation.

## 2023-10-05 ENCOUNTER — Ambulatory Visit
Admission: RE | Admit: 2023-10-05 | Discharge: 2023-10-05 | Disposition: A | Discharge status: Home / Self Care | Payer: 59 | Source: Ambulatory Visit | Attending: Interventional Radiology | Admitting: Interventional Radiology

## 2023-10-05 DIAGNOSIS — C220 Liver cell carcinoma: Secondary | ICD-10-CM

## 2023-10-05 HISTORY — PX: IR RADIOLOGIST EVAL & MGMT: IMG5224

## 2023-10-05 NOTE — Progress Notes (Signed)
Chief Complaint: Patient was consulted remotely today (TeleHealth) for HCV cirrhosis complicated by [facility] at the request of Renia Mikelson K.    Referring Physician(s): Zalika Tieszen K via Dr. Malachy Mood  History of Present Illness: Shawn Bailey is a 77 y.o. 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    He subsequently did well and has been undergoing routine surveillance imaging. His most recent MRI shows a new lesion in segment 4a consistent with new disease.   MRI 09/19/23 1. Unchanged post radiation/post embolization appearance of multiple nonenhancing lesions of hepatic segments VII and VIII, with associated capsular retraction, scarring, and adjacent hyperemia. LI-RADS TR, nonviable. 2. Interval enlargement of an arterially hyperenhancing lesion just anteriorly in hepatic segment IVA measuring 1.3 x 1.1 cm, previously 0.9 x 0.8 cm however without evidence of washout nor capsular enhancement. Given interval growth this is consistent with a LI-RADS category 5 lesion, hepatocellular carcinoma.  Past Medical History:  Diagnosis Date   Depression    Hepatocellular carcinoma (HCC) 03/08/2021   Parasite infestation     Past Surgical History:  Procedure Laterality Date   IR 3D INDEPENDENT WKST  05/25/2022   IR ANGIOGRAM SELECTIVE EACH ADDITIONAL VESSEL  07/01/2021   IR ANGIOGRAM SELECTIVE EACH ADDITIONAL VESSEL  07/22/2021   IR ANGIOGRAM SELECTIVE EACH ADDITIONAL VESSEL  07/22/2021   IR ANGIOGRAM SELECTIVE EACH ADDITIONAL VESSEL  07/22/2021   IR ANGIOGRAM SELECTIVE EACH ADDITIONAL VESSEL  07/22/2021   IR ANGIOGRAM SELECTIVE EACH ADDITIONAL VESSEL  05/25/2022   IR ANGIOGRAM SELECTIVE EACH ADDITIONAL VESSEL  05/25/2022   IR ANGIOGRAM SELECTIVE EACH ADDITIONAL  VESSEL  05/25/2022   IR ANGIOGRAM SELECTIVE EACH ADDITIONAL VESSEL  05/25/2022   IR ANGIOGRAM SELECTIVE EACH ADDITIONAL VESSEL  05/25/2022   IR ANGIOGRAM SELECTIVE EACH ADDITIONAL VESSEL  05/25/2022   IR ANGIOGRAM SELECTIVE EACH ADDITIONAL VESSEL  06/15/2022   IR ANGIOGRAM SELECTIVE EACH ADDITIONAL VESSEL  06/15/2022   IR ANGIOGRAM SELECTIVE EACH ADDITIONAL VESSEL  06/15/2022   IR ANGIOGRAM VISCERAL SELECTIVE  07/01/2021   IR ANGIOGRAM VISCERAL SELECTIVE  07/22/2021   IR ANGIOGRAM VISCERAL SELECTIVE  05/25/2022   IR ANGIOGRAM VISCERAL SELECTIVE  06/15/2022   IR EMBO ARTERIAL NOT HEMORR HEMANG INC GUIDE ROADMAPPING  07/01/2021   IR EMBO TUMOR ORGAN ISCHEMIA INFARCT INC GUIDE ROADMAPPING  07/22/2021   IR EMBO TUMOR ORGAN ISCHEMIA INFARCT INC GUIDE ROADMAPPING  06/15/2022   IR RADIOLOGIST EVAL & MGMT  06/01/2021   IR RADIOLOGIST EVAL & MGMT  04/26/2022   IR RADIOLOGIST EVAL & MGMT  07/12/2022   IR RADIOLOGIST EVAL & MGMT  10/05/2023   IR US GUIDE VASC ACCESS RIGHT  07/01/2021   IR US GUIDE VASC ACCESS RIGHT  07/22/2021   IR US GUIDE VASC ACCESS RIGHT  05/25/2022   IR US GUIDE VASC ACCESS RIGHT  06/15/2022   NO PAST SURGERIES      Allergies: Patient has no known allergies.  Medications: Prior to Admission medications   Medication Sig Start Date End Date Taking? Authorizing Provider  Ascorbic Acid (VITAMIN C) 100 MG tablet Take 100 mg by mouth daily.    [provider]  blood glucose meter kit and supplies Dispense based on patient and insurance preference. Use up to four times daily as directed. (FOR ICD-10 E10.9, E11.9). 11/03/21   Lewie Chamber,  MD  insulin glargine (LANTUS) 100 UNIT/ML injection 40 Units daily.    [provider]  Insulin Pen Needle (PEN NEEDLES 3/16") 31G X 5 MM MISC 1 each by Does not apply route with breakfast, with lunch, and with evening meal. 11/03/21   Lewie Chamber, MD  Multiple Vitamin (MULTIVITAMIN WITH MINERALS) TABS tablet Take 1 tablet by mouth daily.     [provider]  ondansetron (ZOFRAN) 8 MG tablet Take 1 tablet (8 mg total) by mouth every 8 (eight) hours as needed for nausea or vomiting. 04/28/21   Briant Cedar, PA-C  pantoprazole (PROTONIX) 40 MG tablet Take 1 tablet (40 mg total) by mouth daily. 06/28/23   Malachy Mood, MD  polyethylene glycol (MIRALAX / GLYCOLAX) 17 g packet Take 17 g by mouth daily as needed for mild constipation. 03/08/21   Alwyn Ren, MD  prochlorperazine (COMPAZINE) 10 MG tablet Take 1 tablet (10 mg total) by mouth every 6 (six) hours as needed for nausea or vomiting. 04/28/21   Briant Cedar, PA-C  senna (SENOKOT) 8.6 MG TABS tablet Take 1 tablet (8.6 mg total) by mouth 2 (two) times daily. Patient taking differently: Take 1 tablet by mouth 2 (two) times daily as needed for moderate constipation. 03/08/21   Alwyn Ren, MD  vitamin B-12 (CYANOCOBALAMIN) 100 MCG tablet Take 100 mcg by mouth daily.    [provider]     Family History  Problem Relation Age of Onset   Diabetes Mother     Social History   Socioeconomic History   Marital status: Divorced    Spouse name: Not on file   Number of children: 2   Years of education: Not on file   Highest education level: Not on file  Occupational History   Not on file  Tobacco Use   Smoking status: Never   Smokeless tobacco: Never  Vaping Use   Vaping status: Never Used  Substance and Sexual Activity   Alcohol use: Yes    Comment: occasional beer, he used to drink alcohol regularly 15 years    Drug use: Not Currently   Sexual activity: Not on file  Other Topics Concern   Not on file  Social History Narrative   Not on file   Social Drivers of Health   Financial Resource Strain: Not on file  Food Insecurity: Not on file  Transportation Needs: Not on file  Physical Activity: Not on file  Stress: Not on file  Social Connections: Not on file    ECOG Status: 0 - Asymptomatic  Review of Systems  Review of Systems:  A 12 point ROS discussed and pertinent positives are indicated in the HPI above.  All other systems are negative.  Advance Care Plan: The advanced care plan/surrogate decision maker was discussed at the time of visit and the patient did not wish to discuss or was not able to name a surrogate decision maker or provide an advance care plan.    Physical Exam No direct physical exam was performed (except for noted visual exam findings with Video Visits).    Vital Signs: There were no vitals taken for this visit.  Imaging: IR Radiologist Eval & Mgmt Result Date: 10/05/2023 EXAM: ESTABLISHED PATIENT OFFICE VISIT CHIEF COMPLAINT: SEE NOTE IN EPIC HISTORY OF PRESENT ILLNESS: SEE NOTE IN EPIC REVIEW OF SYSTEMS: SEE NOTE IN EPIC PHYSICAL EXAMINATION: SEE NOTE IN EPIC ASSESSMENT AND PLAN: SEE NOTE IN EPIC Electronically Signed   By: Vilma Prader  Archer Asa M.D.   On: 10/05/2023 13:33   MR Abdomen W Wo Contrast Result Date: 09/19/2023 CLINICAL DATA:  Hepatocellular carcinoma restaging EXAM: MRI ABDOMEN WITHOUT AND WITH CONTRAST TECHNIQUE: Multiplanar multisequence MR imaging of the abdomen was performed both before and after the administration of intravenous contrast. CONTRAST:  6mL GADAVIST GADOBUTROL 1 MMOL/ML IV SOLN COMPARISON:  03/29/2023 FINDINGS: Lower chest: No acute abnormality. Hepatobiliary: Mildly coarse contour of the liver. Unchanged post radiation/post embolization appearance of multiple nonenhancing lesions of hepatic segments VII and VIII, with associated capsular retraction, scarring, and adjacent hyperemia (series 22, image 22). Primary treated regions in hepatic segment VIII unchanged at 3.5 x 2.9 cm (series 22, image 20) and posteriorly in hepatic segment VII a relatively solitary lesion measuring 1.6 x 0.9 cm, likewise unchanged (series 22, image 20). Interval enlargement of an arterially hyperenhancing lesion just anteriorly in hepatic segment IVA measuring 1.3 x 1.1 cm, previously 0.9 x 0.8 cm  however without evidence of washout nor capsular enhancement (series 11, image 25). No gallstones, gallbladder wall thickening, or biliary dilatation. Pancreas: Unremarkable. No pancreatic ductal dilatation or surrounding inflammatory changes. Spleen: Normal in size without significant abnormality. Adrenals/Urinary Tract: Adrenal glands are unremarkable. Simple, benign left renal cortical cysts, for which no further follow-up or characterization is required. Kidneys are otherwise normal, without obvious renal calculi, solid lesion, or hydronephrosis. Stomach/Bowel: Stomach is within normal limits. No evidence of bowel wall thickening, distention, or inflammatory changes. Pancolonic diverticulosis. Vascular/Lymphatic: No significant vascular findings are present. No enlarged abdominal lymph nodes. Other: No abdominal wall hernia or abnormality. No ascites. Musculoskeletal: No acute or significant osseous findings. IMPRESSION: 1. Unchanged post radiation/post embolization appearance of multiple nonenhancing lesions of hepatic segments VII and VIII, with associated capsular retraction, scarring, and adjacent hyperemia. LI-RADS TR, nonviable. 2. Interval enlargement of an arterially hyperenhancing lesion just anteriorly in hepatic segment IVA measuring 1.3 x 1.1 cm, previously 0.9 x 0.8 cm however without evidence of washout nor capsular enhancement. Given interval growth this is consistent with a LI-RADS category 5 lesion, hepatocellular carcinoma. 3. Pancolonic diverticulosis. Electronically Signed   By: Jearld Lesch M.D.   On: 09/19/2023 16:54    Labs:  CBC: Recent Labs    12/19/22 1053 03/20/23 1016 06/28/23 1129 09/19/23 1137  WBC 4.3 4.6 4.4 4.6  HGB 13.2 12.5* 13.4 13.6  HCT 36.8* 36.6* 39.5 38.4*  PLT 102* 96* 130* 114*    COAGS: No results for input(s): "INR", "APTT" in the last 8760 hours.  BMP: Recent Labs    12/19/22 1053 03/20/23 1016 06/28/23 1129 09/19/23 1137  NA 132* 132*  130* 130*  K 4.2 4.5 4.8 4.6  CL 100 100 97* 99  CO2 28 27 29 26   GLUCOSE 165* 402* 405* 401*  BUN 16 14 16 14   CALCIUM 9.4 9.2 9.7 9.7  CREATININE 0.80 0.95 0.88 0.86  GFRNONAA >60 >60 >60 >60    LIVER FUNCTION TESTS: Recent Labs    12/19/22 1053 03/20/23 1016 06/28/23 1129 09/19/23 1137  BILITOT 0.7 0.6 1.1 0.8  AST 31 35 32 34  ALT 26 27 26 27   ALKPHOS 61 58 57 56  PROT 7.5 7.1 7.8 7.7  ALBUMIN 3.9 3.7 4.0 4.1    TUMOR MARKERS: No results for input(s): "AFPTM", "CEA", "CA199", "CHROMGRNA" in the last 8760 hours.  Assessment and Plan:   Very pleasant 77 year old gentleman with HCV cirrhosis and a history of multifocal hepatocellular carcinoma.  He has responded very well to prior radiation  segmentectomy's.  Recent surveillance imaging demonstrates a new hepatocellular carcinoma in hepatic segment 4A.  I believe he is a candidate for repeat radiation segmentectomy with Y90.  1.)  Please schedule for pre-Y 90 planning study to be followed by segment 4A radiation segmentectomy.  Procedure to be performed by me at [facility].     Electronically Signed: Sterling Big 10/05/2023, 2:36 PM   I spent a total of  15 Minutes in remote  clinical consultation, greater than 50% of which was counseling/coordinating care for HCV cirrhosis with recurrent HCC.    Visit type: Audio only (telephone). Audio (no video) only due to patient preference. Alternative for in-person consultation at [facility], [address] [place], [place], [place]. This visit type was conducted due to national recommendations for restrictions regarding the COVID-19 Pandemic (e.g. social distancing).  This format is felt to be most appropriate for this patient at this time.  All issues noted in this document were discussed and addressed.

## 2023-10-09 ENCOUNTER — Other Ambulatory Visit (HOSPITAL_COMMUNITY): Payer: Self-pay | Admitting: Interventional Radiology

## 2023-10-09 DIAGNOSIS — C220 Liver cell carcinoma: Secondary | ICD-10-CM

## 2023-10-26 ENCOUNTER — Other Ambulatory Visit (HOSPITAL_COMMUNITY): Payer: 59

## 2023-10-26 ENCOUNTER — Ambulatory Visit (HOSPITAL_COMMUNITY): Payer: 59

## 2023-11-08 ENCOUNTER — Other Ambulatory Visit: Payer: Self-pay | Admitting: Radiology

## 2023-11-08 DIAGNOSIS — C220 Liver cell carcinoma: Secondary | ICD-10-CM

## 2023-11-08 NOTE — H&P (Signed)
Chief Complaint: Patient was seen in consultation today for hepatocellular carcinoma.  Referring Physician(s): Malachy Mood, MD  Supervising Physician: Malachy Moan  Patient Status: [facility] - Out-pt  History of Present Illness: Shawn Bailey is a 78 y.o. male with a past medical history significant for hepatitis C cirrhosis and hepatocellular carcinoma s/p segment 8 TARE segmentectomy 07/22/21 and segment 7 TARE segmentectomy 06/15/22 who presents today for pre-Y90 planning study. Shawn Bailey tolerated previous TARE segmentectomies well and has been undergoing routine surveillance imaging. MRI abdomen w/ and w/o contrast 09/19/23 showed:  1. Unchanged post radiation/post embolization appearance of multiple nonenhancing lesions of hepatic segments VII and VIII, with associated capsular retraction, scarring, and adjacent hyperemia. LI-RADS TR, nonviable. 2. Interval enlargement of an arterially hyperenhancing lesion just anteriorly in hepatic segment IVA measuring 1.3 x 1.1 cm, previously 0.9 x 0.8 cm however without evidence of washout nor capsular enhancement. Given interval growth this is consistent with a LI-RADS category 5 lesion, hepatocellular carcinoma. 3. Pancolonic diverticulosis.  He was seen by Dr. Archer Asa in clinic on 10/05/23 and deemed a candidate for segment 4A TARE segmentectomy. He presents today for pre-Y90 planning study with possible embolization.  Past Medical History:  Diagnosis Date   Depression    Hepatocellular carcinoma (HCC) 03/08/2021   Parasite infestation     Past Surgical History:  Procedure Laterality Date   IR 3D INDEPENDENT WKST  05/25/2022   IR ANGIOGRAM SELECTIVE EACH ADDITIONAL VESSEL  07/01/2021   IR ANGIOGRAM SELECTIVE EACH ADDITIONAL VESSEL  07/22/2021   IR ANGIOGRAM SELECTIVE EACH ADDITIONAL VESSEL  07/22/2021   IR ANGIOGRAM SELECTIVE EACH ADDITIONAL VESSEL  07/22/2021   IR ANGIOGRAM SELECTIVE EACH ADDITIONAL VESSEL  07/22/2021   IR  ANGIOGRAM SELECTIVE EACH ADDITIONAL VESSEL  05/25/2022   IR ANGIOGRAM SELECTIVE EACH ADDITIONAL VESSEL  05/25/2022   IR ANGIOGRAM SELECTIVE EACH ADDITIONAL VESSEL  05/25/2022   IR ANGIOGRAM SELECTIVE EACH ADDITIONAL VESSEL  05/25/2022   IR ANGIOGRAM SELECTIVE EACH ADDITIONAL VESSEL  05/25/2022   IR ANGIOGRAM SELECTIVE EACH ADDITIONAL VESSEL  05/25/2022   IR ANGIOGRAM SELECTIVE EACH ADDITIONAL VESSEL  06/15/2022   IR ANGIOGRAM SELECTIVE EACH ADDITIONAL VESSEL  06/15/2022   IR ANGIOGRAM SELECTIVE EACH ADDITIONAL VESSEL  06/15/2022   IR ANGIOGRAM VISCERAL SELECTIVE  07/01/2021   IR ANGIOGRAM VISCERAL SELECTIVE  07/22/2021   IR ANGIOGRAM VISCERAL SELECTIVE  05/25/2022   IR ANGIOGRAM VISCERAL SELECTIVE  06/15/2022   IR EMBO ARTERIAL NOT HEMORR HEMANG INC GUIDE ROADMAPPING  07/01/2021   IR EMBO TUMOR ORGAN ISCHEMIA INFARCT INC GUIDE ROADMAPPING  07/22/2021   IR EMBO TUMOR ORGAN ISCHEMIA INFARCT INC GUIDE ROADMAPPING  06/15/2022   IR RADIOLOGIST EVAL & MGMT  06/01/2021   IR RADIOLOGIST EVAL & MGMT  04/26/2022   IR RADIOLOGIST EVAL & MGMT  07/12/2022   IR RADIOLOGIST EVAL & MGMT  10/05/2023   IR US GUIDE VASC ACCESS RIGHT  07/01/2021   IR US GUIDE VASC ACCESS RIGHT  07/22/2021   IR US GUIDE VASC ACCESS RIGHT  05/25/2022   IR US GUIDE VASC ACCESS RIGHT  06/15/2022   NO PAST SURGERIES      Allergies: Patient has no known allergies.  Medications: Prior to Admission medications   Medication Sig Start Date End Date Taking? Authorizing Provider  Ascorbic Acid (VITAMIN C) 100 MG tablet Take 100 mg by mouth daily.    [provider]  blood glucose meter kit and supplies Dispense based on patient and insurance preference. Use up to  four times daily as directed. (FOR ICD-10 E10.9, E11.9). 11/03/21   Lewie Chamber, MD  insulin glargine (LANTUS) 100 UNIT/ML injection 40 Units daily.    [provider]  Insulin Pen Needle (PEN NEEDLES 3/16") 31G X 5 MM MISC 1 each by Does not apply route with breakfast, with  lunch, and with evening meal. 11/03/21   Lewie Chamber, MD  Multiple Vitamin (MULTIVITAMIN WITH MINERALS) TABS tablet Take 1 tablet by mouth daily.    [provider]  ondansetron (ZOFRAN) 8 MG tablet Take 1 tablet (8 mg total) by mouth every 8 (eight) hours as needed for nausea or vomiting. 04/28/21   Briant Cedar, PA-C  pantoprazole (PROTONIX) 40 MG tablet Take 1 tablet (40 mg total) by mouth daily. 06/28/23   Malachy Mood, MD  polyethylene glycol (MIRALAX / GLYCOLAX) 17 g packet Take 17 g by mouth daily as needed for mild constipation. 03/08/21   Alwyn Ren, MD  prochlorperazine (COMPAZINE) 10 MG tablet Take 1 tablet (10 mg total) by mouth every 6 (six) hours as needed for nausea or vomiting. 04/28/21   Briant Cedar, PA-C  senna (SENOKOT) 8.6 MG TABS tablet Take 1 tablet (8.6 mg total) by mouth 2 (two) times daily. Patient taking differently: Take 1 tablet by mouth 2 (two) times daily as needed for moderate constipation. 03/08/21   Alwyn Ren, MD  vitamin B-12 (CYANOCOBALAMIN) 100 MCG tablet Take 100 mcg by mouth daily.    [provider]     Family History  Problem Relation Age of Onset   Diabetes Mother     Social History   Socioeconomic History   Marital status: Divorced    Spouse name: Not on file   Number of children: 2   Years of education: Not on file   Highest education level: Not on file  Occupational History   Not on file  Tobacco Use   Smoking status: Never   Smokeless tobacco: Never  Vaping Use   Vaping status: Never Used  Substance and Sexual Activity   Alcohol use: Yes    Comment: occasional beer, he used to drink alcohol regularly 15 years    Drug use: Not Currently   Sexual activity: Not on file  Other Topics Concern   Not on file  Social History Narrative   Not on file   Social Drivers of Health   Financial Resource Strain: Not on file  Food Insecurity: Not on file  Transportation Needs: Not on file  Physical  Activity: Not on file  Stress: Not on file  Social Connections: Not on file     Review of Systems: A 12 point ROS discussed and pertinent positives are indicated in the HPI above.  All other systems are negative.  Review of Systems  Vital Signs: There were no vitals taken for this visit.  Physical Exam       Imaging: No results found.  Labs:  CBC: Recent Labs    12/19/22 1053 03/20/23 1016 06/28/23 1129 09/19/23 1137  WBC 4.3 4.6 4.4 4.6  HGB 13.2 12.5* 13.4 13.6  HCT 36.8* 36.6* 39.5 38.4*  PLT 102* 96* 130* 114*    COAGS: No results for input(s): "INR", "APTT" in the last 8760 hours.  BMP: Recent Labs    12/19/22 1053 03/20/23 1016 06/28/23 1129 09/19/23 1137  NA 132* 132* 130* 130*  K 4.2 4.5 4.8 4.6  CL 100 100 97* 99  CO2 28 27 29 26   GLUCOSE  165* 402* 405* 401*  BUN 16 14 16 14   CALCIUM 9.4 9.2 9.7 9.7  CREATININE 0.80 0.95 0.88 0.86  GFRNONAA >60 >60 >60 >60    LIVER FUNCTION TESTS: Recent Labs    12/19/22 1053 03/20/23 1016 06/28/23 1129 09/19/23 1137  BILITOT 0.7 0.6 1.1 0.8  AST 31 35 32 34  ALT 26 27 26 27   ALKPHOS 61 58 57 56  PROT 7.5 7.1 7.8 7.7  ALBUMIN 3.9 3.7 4.0 4.1    TUMOR MARKERS: No results for input(s): "AFPTM", "CEA", "CA199", "CHROMGRNA" in the last 8760 hours.  Assessment and Plan:  78 y/o M with history of hepatitis C cirrhosis and hepatocellular carcinoma s/p segment 8 TARE segmentectomy 07/22/21 and segment 7 TARE segmentectomy 06/15/22 who presents today for pre-Y90 planning study with possible embolization prior to segment 4A TARE segmentectomy.  Risks and benefits of angiogram with possible embolization were discussed with the patient including, but not limited to bleeding, infection, vascular injury, stroke, or contrast induced renal failure.  This interventional procedure involves the use of X-rays and because of the nature of the planned procedure, it is possible that we will have prolonged use of X-ray  fluoroscopy.  Potential radiation risks to you include (but are not limited to) the following: - A slightly elevated risk for cancer  several years later in life. This risk is typically less than 0.5% percent. This risk is low in comparison to the normal incidence of human cancer, which is 33% for women and 50% for men according to the [employer] Society. - Radiation induced injury can include skin redness, resembling a rash, tissue breakdown / ulcers and hair loss (which can be temporary or permanent).  The likelihood of either of these occurring depends on the difficulty of the procedure and whether you are sensitive to radiation due to previous procedures, disease, or genetic conditions.  IF your procedure requires a prolonged use of radiation, you will be notified and given written instructions for further action.  It is your responsibility to monitor the irradiated area for the 2 weeks following the procedure and to notify your physician if you are concerned that you have suffered a radiation induced injury.    All of the patient's questions were answered, patient is agreeable to proceed.  Consent signed and in chart.  Thank you for this interesting consult.  I greatly enjoyed meeting Shawn Bailey and look forward to participating in their care.  A copy of this report was sent to the requesting provider on this date.  Electronically Signed: Villa Herb, PA-C 11/08/2023, 11:43 AM   I spent a total of  30 Minutes  in face to face in clinical consultation, greater than 50% of which was counseling/coordinating care for hepatocellular carcinoma.

## 2023-11-09 ENCOUNTER — Other Ambulatory Visit (HOSPITAL_COMMUNITY): Payer: Self-pay | Admitting: Interventional Radiology

## 2023-11-09 ENCOUNTER — Ambulatory Visit (HOSPITAL_COMMUNITY): Payer: 59

## 2023-11-09 ENCOUNTER — Other Ambulatory Visit (HOSPITAL_COMMUNITY): Payer: 59

## 2023-11-09 ENCOUNTER — Ambulatory Visit (HOSPITAL_COMMUNITY)
Admission: RE | Admit: 2023-11-09 | Discharge: 2023-11-09 | Disposition: A | Discharge status: Home / Self Care | Payer: 59 | Source: Ambulatory Visit | Attending: Interventional Radiology | Admitting: Interventional Radiology

## 2023-11-09 ENCOUNTER — Encounter (HOSPITAL_COMMUNITY): Payer: Self-pay

## 2023-11-09 ENCOUNTER — Encounter (HOSPITAL_COMMUNITY)
Admission: RE | Admit: 2023-11-09 | Discharge: 2023-11-09 | Disposition: A | Discharge status: Home / Self Care | Payer: 59 | Source: Ambulatory Visit | Attending: Interventional Radiology | Admitting: Interventional Radiology

## 2023-11-09 ENCOUNTER — Encounter (HOSPITAL_COMMUNITY): Payer: Self-pay | Admitting: Interventional Radiology

## 2023-11-09 DIAGNOSIS — C220 Liver cell carcinoma: Secondary | ICD-10-CM

## 2023-11-09 DIAGNOSIS — K7460 Unspecified cirrhosis of liver: Secondary | ICD-10-CM | POA: Insufficient documentation

## 2023-11-09 HISTORY — PX: IR US GUIDE VASC ACCESS RIGHT: IMG2390

## 2023-11-09 HISTORY — PX: IR ANGIOGRAM SELECTIVE EACH ADDITIONAL VESSEL: IMG667

## 2023-11-09 HISTORY — PX: IR ANGIOGRAM VISCERAL SELECTIVE: IMG657

## 2023-11-09 HISTORY — DX: Type 2 diabetes mellitus without complications: E11.9

## 2023-11-09 HISTORY — PX: IR 3D INDEPENDENT WKST: IMG2385

## 2023-11-09 LAB — CBC
HCT: 38.9 % — ABNORMAL LOW (ref 39.0–52.0)
Hemoglobin: 13.2 g/dL (ref 13.0–17.0)
MCH: 30.1 pg (ref 26.0–34.0)
MCHC: 33.9 g/dL (ref 30.0–36.0)
MCV: 88.8 fL (ref 80.0–100.0)
Platelets: 107 10*3/uL — ABNORMAL LOW (ref 150–400)
RBC: 4.38 MIL/uL (ref 4.22–5.81)
RDW: 12.4 % (ref 11.5–15.5)
WBC: 4.8 10*3/uL (ref 4.0–10.5)
nRBC: 0 % (ref 0.0–0.2)

## 2023-11-09 LAB — GLUCOSE, CAPILLARY: Glucose-Capillary: 109 mg/dL — ABNORMAL HIGH (ref 70–99)

## 2023-11-09 LAB — COMPREHENSIVE METABOLIC PANEL
ALT: 33 U/L (ref 0–44)
AST: 37 U/L (ref 15–41)
Albumin: 3.8 g/dL (ref 3.5–5.0)
Alkaline Phosphatase: 43 U/L (ref 38–126)
Anion gap: 11 (ref 5–15)
BUN: 14 mg/dL (ref 8–23)
CO2: 24 mmol/L (ref 22–32)
Calcium: 9.3 mg/dL (ref 8.9–10.3)
Chloride: 100 mmol/L (ref 98–111)
Creatinine, Ser: 0.44 mg/dL — ABNORMAL LOW (ref 0.61–1.24)
GFR, Estimated: >60 mL/min (ref >60)
Glucose, Bld: 105 mg/dL — ABNORMAL HIGH (ref 70–99)
Potassium: 3.9 mmol/L (ref 3.5–5.1)
Sodium: 135 mmol/L (ref 135–145)
Total Bilirubin: 1.1 mg/dL (ref 0.0–1.2)
Total Protein: 7.3 g/dL (ref 6.5–8.1)

## 2023-11-09 LAB — PROTIME-INR
INR: 1.1 (ref 0.8–1.2)
Prothrombin Time: 14.5 s (ref 11.4–15.2)

## 2023-11-09 LAB — APTT: aPTT: 28 s (ref 24–36)

## 2023-11-09 MED ORDER — MIDAZOLAM HCL 2 MG/2ML IJ SOLN
INTRAMUSCULAR | Status: AC | PRN
  Administered 2023-11-09: 1 mg via INTRAVENOUS

## 2023-11-09 MED ORDER — FENTANYL CITRATE (PF) 100 MCG/2ML IJ SOLN
INTRAMUSCULAR | Status: AC | PRN
  Administered 2023-11-09: 50 ug via INTRAVENOUS

## 2023-11-09 MED ORDER — SODIUM CHLORIDE 0.9 % IV SOLN: INTRAVENOUS | Status: DC

## 2023-11-09 MED ORDER — IOHEXOL 300 MG/ML  SOLN
50.0000 mL | Freq: Once | INTRAMUSCULAR | Status: AC | PRN
  Administered 2023-11-09: 40 mL via INTRA_ARTERIAL

## 2023-11-09 MED ORDER — MIDAZOLAM HCL 2 MG/2ML IJ SOLN: 1.0000 mg | Freq: Once | INTRAMUSCULAR | Status: DC | PRN

## 2023-11-09 MED ORDER — LIDOCAINE HCL 1 % IJ SOLN
20.0000 mL | Freq: Once | INTRAMUSCULAR | Status: AC
  Administered 2023-11-09: 2 mL via INTRADERMAL

## 2023-11-09 MED ORDER — TECHNETIUM TO 99M ALBUMIN AGGREGATED
4.2000 | Freq: Once | INTRAVENOUS | Status: AC | PRN
  Administered 2023-11-09: 4.2 via INTRAVENOUS

## 2023-11-09 MED ORDER — IOHEXOL 300 MG/ML  SOLN
100.0000 mL | Freq: Once | INTRAMUSCULAR | Status: AC | PRN
  Administered 2023-11-09: 40 mL via INTRA_ARTERIAL

## 2023-11-09 MED ORDER — MIDAZOLAM HCL 2 MG/2ML IJ SOLN
INTRAMUSCULAR | Status: AC
  Filled 2023-11-09: qty 4

## 2023-11-09 MED ORDER — LIDOCAINE HCL 1 % IJ SOLN
INTRAMUSCULAR | Status: AC
  Filled 2023-11-09: qty 20

## 2023-11-09 MED ORDER — FENTANYL CITRATE (PF) 100 MCG/2ML IJ SOLN
INTRAMUSCULAR | Status: AC
  Filled 2023-11-09: qty 2

## 2023-11-09 NOTE — Procedures (Signed)
Interventional Radiology Procedure Note  Procedure: Pre-Y90 planning study  Complications: None  Estimated Blood Loss: None  Recommendations: - DC home after 1 hr   Signed,  Sterling Big, MD

## 2023-11-21 ENCOUNTER — Other Ambulatory Visit: Payer: Self-pay | Admitting: Radiology

## 2023-11-21 DIAGNOSIS — C220 Liver cell carcinoma: Secondary | ICD-10-CM

## 2023-11-21 NOTE — H&P (Signed)
 Chief Complaint: Hepatocellular carcinoma   Referring Provider(s): Lanny Callander  Supervising Physician: Karalee Beat  Patient Status: [facility] - Out-pt  History of Present Illness: Shawn Bailey is a 78 y.o. 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  Shawn Bailey tolerated previous TARE segmentectomies well and has been undergoing routine surveillance imaging. MRI abdomen w/ and w/o contrast 09/19/23 showed: 1. Unchanged post radiation/post embolization appearance of multiple nonenhancing lesions of hepatic segments VII and VIII, with associated capsular retraction, scarring, and adjacent hyperemia. LI-RADS TR, nonviable. 2. Interval enlargement of an arterially hyperenhancing lesion just anteriorly in hepatic segment IVA measuring 1.3 x 1.1 cm, previously 0.9 x 0.8 cm however without evidence of washout nor capsular enhancement. Given interval growth this is consistent with a LI-RADS category 5 lesion, hepatocellular carcinoma. 3. Pancolonic diverticulosis.   He was seen by Dr. Karalee in clinic on 10/05/23 and deemed a candidate for segment 4A TARE segmentectomy.   He underwent pre-Y90 planning study on 11/09/23 by Dr. Karalee.  He presents today for Y90 treatment.  He is NPO. No nausea/vomiting. No Fever/chills. ROS negative.   Patient is Full Code  Past Medical History:  Diagnosis Date   Depression    Diabetes mellitus without complication (HCC)    Hepatocellular carcinoma (HCC) 03/08/2021   Parasite infestation      Allergies: Patient has no known allergies.  Medications: Prior to Admission medications   Medication Sig Start Date End Date Taking? Authorizing Provider  Ascorbic Acid  (VITAMIN C) 100 MG tablet Take 100 mg by mouth daily.    [provider]  blood glucose meter kit and supplies Dispense based on  patient and insurance preference. Use up to four times daily as directed. (FOR ICD-10 E10.9, E11.9). 11/03/21   Patsy Lenis, MD  insulin  glargine (LANTUS ) 100 UNIT/ML injection 40 Units daily.    [provider]  Insulin  Pen Needle (PEN NEEDLES 3/16) 31G X 5 MM MISC 1 each by Does not apply route with breakfast, with lunch, and with evening meal. 11/03/21   Patsy Lenis, MD  Multiple Vitamin (MULTIVITAMIN WITH MINERALS) TABS tablet Take 1 tablet by mouth daily.    [provider]  ondansetron  (ZOFRAN ) 8 MG tablet Take 1 tablet (8 mg total) by mouth every 8 (eight) hours as needed for nausea or vomiting. 04/28/21   Neomi Lis T, PA-C  pantoprazole  (PROTONIX ) 40 MG tablet Take 1 tablet (40 mg total) by mouth daily. 06/28/23   Lanny Callander, MD  polyethylene glycol (MIRALAX  / GLYCOLAX ) 17 g packet Take 17 g by mouth daily as needed for mild constipation. 03/08/21   Will Almarie MATSU, MD  prochlorperazine  (COMPAZINE ) 10 MG tablet Take 1 tablet (10 mg total) by mouth every 6 (six) hours as needed for nausea or vomiting. 04/28/21   Neomi Lis DASEN, PA-C  senna (SENOKOT) 8.6 MG TABS tablet Take 1 tablet (8.6 mg total) by mouth 2 (two) times daily. Patient taking differently: Take 1 tablet by mouth 2 (two) times daily as needed for moderate constipation. 03/08/21   Will Almarie MATSU, MD  vitamin B-12 (CYANOCOBALAMIN ) 100 MCG tablet Take 100 mcg by mouth daily.    [provider]     Family History  Problem Relation Age of Onset   Diabetes Mother     Social History   Socioeconomic History   Marital status: Divorced    Spouse name:  Not on file   Number of children: 2   Years of education: Not on file   Highest education level: Not on file  Occupational History   Not on file  Tobacco Use   Smoking status: Never   Smokeless tobacco: Never  Vaping Use   Vaping status: Never Used  Substance and Sexual Activity   Alcohol use: Yes    Comment: occasional beer, he used  to drink alcohol regularly 15 years    Drug use: Not Currently   Sexual activity: Not on file  Other Topics Concern   Not on file  Social History Narrative   Not on file   Social Drivers of Health   Financial Resource Strain: Not on file  Food Insecurity: Not on file  Transportation Needs: Not on file  Physical Activity: Not on file  Stress: Not on file  Social Connections: Not on file     Review of Systems: A 12 point ROS discussed and pertinent positives are indicated in the HPI above.  All other systems are negative.   Vital Signs: BP (!) 147/67 (BP Location: Left Arm)   Pulse 67   Temp 98 F (36.7 C) (Oral)   Resp 16   SpO2 98%   Advance Care Plan: The advanced care place/surrogate decision maker was discussed at the time of visit and the patient did not wish to discuss or was not able to name a surrogate decision maker or provide an advance care plan.  Physical Exam Vitals reviewed.  Constitutional:      Appearance: Normal appearance.  HENT:     Head: Normocephalic and atraumatic.  Eyes:     Extraocular Movements: Extraocular movements intact.  Cardiovascular:     Rate and Rhythm: Normal rate and regular rhythm.  Pulmonary:     Effort: Pulmonary effort is normal. No respiratory distress.     Breath sounds: Normal breath sounds.  Abdominal:     General: There is no distension.     Palpations: Abdomen is soft.     Tenderness: There is no abdominal tenderness.  Musculoskeletal:        General: Normal range of motion.     Cervical back: Normal range of motion.  Skin:    General: Skin is warm and dry.  Neurological:     General: No focal deficit present.     Mental Status: He is alert and oriented to person, place, and time.  Psychiatric:        Mood and Affect: Mood normal.        Behavior: Behavior normal.        Thought Content: Thought content normal.        Judgment: Judgment normal.     Imaging:   Labs:  CBC: Recent Labs    03/20/23 1016  06/28/23 1129 09/19/23 1137 11/09/23 0815  WBC 4.6 4.4 4.6 4.8  HGB 12.5* 13.4 13.6 13.2  HCT 36.6* 39.5 38.4* 38.9*  PLT 96* 130* 114* 107*    COAGS: Recent Labs    11/09/23 0815  INR 1.1  APTT 28    BMP: Recent Labs    03/20/23 1016 06/28/23 1129 09/19/23 1137 11/09/23 0815  NA 132* 130* 130* 135  K 4.5 4.8 4.6 3.9  CL 100 97* 99 100  CO2 27 29 26 24   GLUCOSE 402* 405* 401* 105*  BUN 14 16 14 14   CALCIUM 9.2 9.7 9.7 9.3  CREATININE 0.95 0.88 0.86 0.44*  GFRNONAA >60 >60 >60 >  60    LIVER FUNCTION TESTS: Recent Labs    03/20/23 1016 06/28/23 1129 09/19/23 1137 11/09/23 0815  BILITOT 0.6 1.1 0.8 1.1  AST 35 32 34 37  ALT 27 26 27  33  ALKPHOS 58 57 56 43  PROT 7.1 7.8 7.7 7.3  ALBUMIN  3.7 4.0 4.1 3.8    TUMOR MARKERS: No results for input(s): AFPTM, CEA, CA199, CHROMGRNA in the last 8760 hours.  Assessment and Plan:  Hepatocellular carcinoma  Will proceed with Y90 treatment today by Dr. Karalee.  Risks and benefits discussed with the patient including, but not limited to bleeding, infection, vascular injury, post procedural pain, nausea, vomiting and fatigue, contrast induced renal failure, liver failure, radiation injury to the bowel, radiation induced cholecystitis, neutropenia and possible need for additional procedures.  All of the patient's questions were answered, patient is agreeable to proceed. Consent signed and in chart.  Thank you for allowing our service to participate in [place] 's care.  Electronically Signed: SARI GORMAN LAMP, PA-C   11/22/2023, 8:07 AM      I spent a total of    25 Minutes in face to face in clinical consultation, greater than 50% of which was counseling/coordinating care for Y90 treatment.

## 2023-11-22 ENCOUNTER — Ambulatory Visit (HOSPITAL_COMMUNITY)
Admission: RE | Admit: 2023-11-22 | Discharge: 2023-11-22 | Disposition: A | Discharge status: Home / Self Care | Payer: 59 | Source: Ambulatory Visit | Attending: Interventional Radiology | Admitting: Interventional Radiology

## 2023-11-22 ENCOUNTER — Other Ambulatory Visit (HOSPITAL_COMMUNITY): Payer: Self-pay | Admitting: Interventional Radiology

## 2023-11-22 ENCOUNTER — Encounter (HOSPITAL_COMMUNITY): Payer: Self-pay

## 2023-11-22 ENCOUNTER — Ambulatory Visit (HOSPITAL_COMMUNITY)
Admission: RE | Admit: 2023-11-22 | Discharge: 2023-11-22 | Disposition: A | Discharge status: Home / Self Care | Payer: 59 | Source: Ambulatory Visit | Attending: Interventional Radiology

## 2023-11-22 ENCOUNTER — Other Ambulatory Visit: Payer: Self-pay

## 2023-11-22 ENCOUNTER — Other Ambulatory Visit: Payer: Self-pay | Admitting: Physician Assistant

## 2023-11-22 VITALS — BP 137/69 | HR 71 | Temp 98.8°F | Resp 16 | Ht 65.0 in | Wt 127.4 lb

## 2023-11-22 DIAGNOSIS — Z01818 Encounter for other preprocedural examination: Secondary | ICD-10-CM

## 2023-11-22 DIAGNOSIS — C220 Liver cell carcinoma: Secondary | ICD-10-CM | POA: Insufficient documentation

## 2023-11-22 HISTORY — PX: IR ANGIOGRAM SELECTIVE EACH ADDITIONAL VESSEL: IMG667

## 2023-11-22 HISTORY — PX: IR US GUIDE VASC ACCESS RIGHT: IMG2390

## 2023-11-22 HISTORY — PX: IR ANGIOGRAM VISCERAL SELECTIVE: IMG657

## 2023-11-22 HISTORY — PX: IR EMBO TUMOR ORGAN ISCHEMIA INFARCT INC GUIDE ROADMAPPING: IMG5449

## 2023-11-22 LAB — CBC
HCT: 39.7 % (ref 39.0–52.0)
Hemoglobin: 13.1 g/dL (ref 13.0–17.0)
MCH: 30 pg (ref 26.0–34.0)
MCHC: 33 g/dL (ref 30.0–36.0)
MCV: 90.8 fL (ref 80.0–100.0)
Platelets: 104 10*3/uL — ABNORMAL LOW (ref 150–400)
RBC: 4.37 MIL/uL (ref 4.22–5.81)
RDW: 12.6 % (ref 11.5–15.5)
WBC: 4.4 10*3/uL (ref 4.0–10.5)
nRBC: 0 % (ref 0.0–0.2)

## 2023-11-22 LAB — COMPREHENSIVE METABOLIC PANEL
ALT: 32 U/L (ref 0–44)
AST: 36 U/L (ref 15–41)
Albumin: 4 g/dL (ref 3.5–5.0)
Alkaline Phosphatase: 48 U/L (ref 38–126)
Anion gap: 6 (ref 5–15)
BUN: 17 mg/dL (ref 8–23)
CO2: 31 mmol/L (ref 22–32)
Calcium: 9.6 mg/dL (ref 8.9–10.3)
Chloride: 99 mmol/L (ref 98–111)
Creatinine, Ser: 0.53 mg/dL — ABNORMAL LOW (ref 0.61–1.24)
GFR, Estimated: >60 mL/min (ref >60)
Glucose, Bld: 123 mg/dL — ABNORMAL HIGH (ref 70–99)
Potassium: 4.4 mmol/L (ref 3.5–5.1)
Sodium: 136 mmol/L (ref 135–145)
Total Bilirubin: 0.8 mg/dL (ref 0.0–1.2)
Total Protein: 8.1 g/dL (ref 6.5–8.1)

## 2023-11-22 LAB — GLUCOSE, CAPILLARY
Glucose-Capillary: 35 mg/dL — CL (ref 70–99)
Glucose-Capillary: 183 mg/dL — ABNORMAL HIGH (ref 70–99)
Glucose-Capillary: 96 mg/dL (ref 70–99)
Glucose-Capillary: 156 mg/dL — ABNORMAL HIGH (ref 70–99)

## 2023-11-22 LAB — PROTIME-INR
INR: 1.1 (ref 0.8–1.2)
Prothrombin Time: 14 s (ref 11.4–15.2)

## 2023-11-22 LAB — APTT: aPTT: 28 s (ref 24–36)

## 2023-11-22 MED ORDER — DEXAMETHASONE SODIUM PHOSPHATE 10 MG/ML IJ SOLN
8.0000 mg | Freq: Once | INTRAMUSCULAR | Status: AC
  Administered 2023-11-22: 8 mg via INTRAVENOUS

## 2023-11-22 MED ORDER — MIDAZOLAM HCL 2 MG/2ML IJ SOLN
INTRAMUSCULAR | Status: AC | PRN
  Administered 2023-11-22: 1 mg via INTRAVENOUS

## 2023-11-22 MED ORDER — LIDOCAINE HCL (PF) 1 % IJ SOLN
30.0000 mL | Freq: Once | INTRAMUSCULAR | Status: AC
Start: 2023-11-22 — End: 2023-11-22
  Administered 2023-11-22: 4 mL via INTRADERMAL

## 2023-11-22 MED ORDER — IOHEXOL 300 MG/ML  SOLN
100.0000 mL | Freq: Once | INTRAMUSCULAR | Status: AC | PRN
  Administered 2023-11-22: 80 mL via INTRA_ARTERIAL

## 2023-11-22 MED ORDER — PANTOPRAZOLE SODIUM 40 MG IV SOLR
INTRAVENOUS | Status: AC
  Filled 2023-11-22: qty 10

## 2023-11-22 MED ORDER — FENTANYL CITRATE (PF) 100 MCG/2ML IJ SOLN
INTRAMUSCULAR | Status: AC
  Filled 2023-11-22: qty 2

## 2023-11-22 MED ORDER — NITROGLYCERIN 1 MG/10 ML FOR IR/CATH LAB
INTRA_ARTERIAL | Status: AC | PRN
  Administered 2023-11-22 (×2): 200 ug

## 2023-11-22 MED ORDER — CEFOXITIN SODIUM 2 G IV SOLR
2.0000 g | Freq: Once | INTRAVENOUS | Status: AC
  Administered 2023-11-22: 2 g via INTRAVENOUS

## 2023-11-22 MED ORDER — LIDOCAINE HCL (PF) 1 % IJ SOLN
INTRAMUSCULAR | Status: AC
  Filled 2023-11-22: qty 30

## 2023-11-22 MED ORDER — PANTOPRAZOLE SODIUM 40 MG IV SOLR
40.0000 mg | Freq: Once | INTRAVENOUS | Status: AC
  Administered 2023-11-22: 40 mg via INTRAVENOUS

## 2023-11-22 MED ORDER — ONDANSETRON 8 MG/NS 50 ML IVPB
8.0000 mg | Freq: Once | INTRAVENOUS | Status: AC
  Administered 2023-11-22: 8 mg via INTRAVENOUS
  Filled 2023-11-22: qty 8

## 2023-11-22 MED ORDER — SODIUM CHLORIDE 0.9 % IV SOLN
INTRAVENOUS | Status: AC
  Filled 2023-11-22: qty 2

## 2023-11-22 MED ORDER — FENTANYL CITRATE (PF) 100 MCG/2ML IJ SOLN
INTRAMUSCULAR | Status: AC | PRN
  Administered 2023-11-22: 50 ug via INTRAVENOUS

## 2023-11-22 MED ORDER — DEXTROSE 50 % IV SOLN
12.5000 g | INTRAVENOUS | Status: AC
  Administered 2023-11-22: 25 g via INTRAVENOUS

## 2023-11-22 MED ORDER — SODIUM CHLORIDE 0.9 % IV SOLN
INTRAVENOUS | Status: AC
  Filled 2023-11-22: qty 20

## 2023-11-22 MED ORDER — MIDAZOLAM HCL 2 MG/2ML IJ SOLN
INTRAMUSCULAR | Status: AC
  Filled 2023-11-22: qty 4

## 2023-11-22 MED ORDER — NITROGLYCERIN IN D5W 100-5 MCG/ML-% IV SOLN
INTRAVENOUS | Status: AC
  Filled 2023-11-22: qty 250

## 2023-11-22 MED ORDER — DEXTROSE 50 % IV SOLN
INTRAVENOUS | Status: AC
  Filled 2023-11-22: qty 50

## 2023-11-22 MED ORDER — DEXAMETHASONE SODIUM PHOSPHATE 10 MG/ML IJ SOLN
INTRAMUSCULAR | Status: AC
  Filled 2023-11-22: qty 1

## 2023-11-22 MED ORDER — SODIUM CHLORIDE 0.9 % IV SOLN
8.0000 mg | Freq: Once | INTRAVENOUS | Status: DC
  Filled 2023-11-22: qty 4

## 2023-11-22 NOTE — Progress Notes (Signed)
 183 CBG @ 0748 . Results not crossing over into computer, Nicky Barrack PA aware

## 2023-11-22 NOTE — Procedures (Signed)
Interventional Radiology Procedure Note  Procedure: Transarterial radioembolization segmentectomy of segment 4  Complications: None  Estimated Blood Loss: None  Recommendations: - DC home after 1 hr  Signed,  Sterling Big, MD

## 2023-11-22 NOTE — Sedation Documentation (Signed)
Transfer to nuc med 

## 2023-11-23 ENCOUNTER — Other Ambulatory Visit (HOSPITAL_COMMUNITY): Payer: 59

## 2023-11-23 ENCOUNTER — Ambulatory Visit (HOSPITAL_COMMUNITY): Payer: 59

## 2023-11-26 NOTE — Assessment & Plan Note (Signed)
 cT3N0M, stage IIIA    -diagnosed in 02/2021, liver biopsy 03/08/21 confirmed moderately differentiated hepatocellular carcinoma. No nodal or distant metastasis on CT 03/05/21.  --He is not a candidate for liver resection or transplant. Dr. Romero at [facility] did not recommend surgery given the location and extent of disease. -He started atezolizumab  and bevacizumab  on 04/28/21, held after 09/2021 due to newly diagnosed type 1 DM with hyperglycemia which was probably related to immunotherapy. --he underwent segment 8 Y90 on 07/22/21 and 06/15/2022  -he had good response to therapies  -Reviewed his restaging CT 09/15/2022 showed stable treated liver lesion, and a enlarging liver lesion in segment VIII (from 4mm to 8mm, LI-RADS category 4) -I personally reviewed his restaging MRI from last week, which showed a stable treated liver lesion, previously LI-RADS [name] category 4 lesion in right lobe of liver is overall stable in size, no definitive washout and enhancing capsule, it is considered LI RADS 3 now.  No additional new lesion. -Patient has developed significant intermittent pain in the right upper quadrant of abdomen, exam showed enlarged left lobe of liver, mild tenderness. -Restaging liver MRI on March 30, 2023 showed stable disease, no new lesions. -His tumor marker AFP remains to be negative (initially at 1479) -Repeated MRI from October 16, 2023 showed stable post embolization appearance of multiple liver lesions, and a slightly enlargement of in arterially hyperenhancing lesion in segment 4A measuring 1.3 cm.  He was referred back to IR and underwent Y90 on 11/22/2023

## 2023-11-27 ENCOUNTER — Inpatient Hospital Stay: Payer: 59 | Attending: Hematology | Admitting: Hematology

## 2023-11-27 ENCOUNTER — Inpatient Hospital Stay: Payer: 59

## 2023-11-27 ENCOUNTER — Other Ambulatory Visit: Payer: Self-pay

## 2023-11-27 VITALS — BP 136/83 | HR 84 | Temp 98.1°F | Resp 16 | Ht 65.0 in | Wt 123.7 lb

## 2023-11-27 DIAGNOSIS — C220 Liver cell carcinoma: Secondary | ICD-10-CM

## 2023-11-27 DIAGNOSIS — R634 Abnormal weight loss: Secondary | ICD-10-CM | POA: Insufficient documentation

## 2023-11-27 DIAGNOSIS — Z923 Personal history of irradiation: Secondary | ICD-10-CM | POA: Insufficient documentation

## 2023-11-27 DIAGNOSIS — E119 Type 2 diabetes mellitus without complications: Secondary | ICD-10-CM | POA: Diagnosis not present

## 2023-11-27 DIAGNOSIS — Z794 Long term (current) use of insulin: Secondary | ICD-10-CM | POA: Insufficient documentation

## 2023-11-27 DIAGNOSIS — N200 Calculus of kidney: Secondary | ICD-10-CM | POA: Diagnosis not present

## 2023-11-27 DIAGNOSIS — R531 Weakness: Secondary | ICD-10-CM | POA: Diagnosis not present

## 2023-11-27 DIAGNOSIS — K5730 Diverticulosis of large intestine without perforation or abscess without bleeding: Secondary | ICD-10-CM | POA: Insufficient documentation

## 2023-11-27 DIAGNOSIS — I2510 Atherosclerotic heart disease of native coronary artery without angina pectoris: Secondary | ICD-10-CM | POA: Insufficient documentation

## 2023-11-27 DIAGNOSIS — Z79899 Other long term (current) drug therapy: Secondary | ICD-10-CM | POA: Insufficient documentation

## 2023-11-27 DIAGNOSIS — I700 Atherosclerosis of aorta: Secondary | ICD-10-CM | POA: Diagnosis not present

## 2023-11-27 LAB — CMP (CANCER CENTER ONLY)
ALT: 34 U/L (ref 0–44)
AST: 38 U/L (ref 15–41)
Albumin: 4 g/dL (ref 3.5–5.0)
Alkaline Phosphatase: 69 U/L (ref 38–126)
Anion gap: 4 — ABNORMAL LOW (ref 5–15)
BUN: 18 mg/dL (ref 8–23)
CO2: 29 mmol/L (ref 22–32)
Calcium: 9.5 mg/dL (ref 8.9–10.3)
Chloride: 98 mmol/L (ref 98–111)
Creatinine: 0.81 mg/dL (ref 0.61–1.24)
GFR, Estimated: >60 mL/min (ref >60)
Glucose, Bld: 305 mg/dL — ABNORMAL HIGH (ref 70–99)
Potassium: 4.7 mmol/L (ref 3.5–5.1)
Sodium: 131 mmol/L — ABNORMAL LOW (ref 135–145)
Total Bilirubin: 1 mg/dL (ref 0.0–1.2)
Total Protein: 7.7 g/dL (ref 6.5–8.1)

## 2023-11-27 LAB — CBC WITH DIFFERENTIAL (CANCER CENTER ONLY)
Abs Immature Granulocytes: 0.02 10*3/uL (ref 0.00–0.07)
Basophils Absolute: 0 10*3/uL (ref 0.0–0.1)
Basophils Relative: 0 %
Eosinophils Absolute: 0.1 10*3/uL (ref 0.0–0.5)
Eosinophils Relative: 2 %
HCT: 39.6 % (ref 39.0–52.0)
Hemoglobin: 13.7 g/dL (ref 13.0–17.0)
Immature Granulocytes: 0 %
Lymphocytes Relative: 9 %
Lymphs Abs: 0.4 10*3/uL — ABNORMAL LOW (ref 0.7–4.0)
MCH: 30.2 pg (ref 26.0–34.0)
MCHC: 34.6 g/dL (ref 30.0–36.0)
MCV: 87.4 fL (ref 80.0–100.0)
Monocytes Absolute: 0.5 10*3/uL (ref 0.1–1.0)
Monocytes Relative: 11 %
Neutro Abs: 3.9 10*3/uL (ref 1.7–7.7)
Neutrophils Relative %: 78 %
Platelet Count: 119 10*3/uL — ABNORMAL LOW (ref 150–400)
RBC: 4.53 MIL/uL (ref 4.22–5.81)
RDW: 12.1 % (ref 11.5–15.5)
WBC Count: 5 10*3/uL (ref 4.0–10.5)
nRBC: 0 % (ref 0.0–0.2)

## 2023-11-27 NOTE — Progress Notes (Signed)
 [facility]   [phone] [phone]   Clinic Follow up Note   Patient Care Team: [name], MD as PCP - General (Internal Medicine) Sonja Kirby, MD as Consulting Physician (Hematology and Oncology) [name], RN (Inactive) as Oncology Nurse Navigator  Date of Service:  11/27/2023  CHIEF COMPLAINT: f/u of [facility]  CURRENT THERAPY:  Cancer surveillance  Oncology History   Hepatocellular carcinoma (HCC) cT3N0M, stage IIIA    -diagnosed in 02/2021, liver biopsy 03/08/21 confirmed moderately differentiated hepatocellular carcinoma. No nodal or distant metastasis on CT 03/05/21.  --He is not a candidate for liver resection or transplant. Dr. [name] at [facility] did not recommend surgery given the location and extent of disease. -He started atezolizumab  and bevacizumab  on 04/28/21, held after 09/2021 due to newly diagnosed type 1 DM with hyperglycemia which was probably related to immunotherapy. --he underwent segment 8 Y90 on 07/22/21 and 06/15/2022  -he had good response to therapies  -Reviewed his restaging CT 09/15/2022 showed stable treated liver lesion, and a enlarging liver lesion in segment VIII (from 4mm to 8mm, LI-RADS category 4) -I personally reviewed his restaging MRI from last week, which showed a stable treated liver lesion, previously LI-RADS [name] category 4 lesion in right lobe of liver is overall stable in size, no definitive washout and enhancing capsule, it is considered LI RADS 3 now.  No additional new lesion. -Patient has developed significant intermittent pain in the right upper quadrant of abdomen, exam showed enlarged left lobe of liver, mild tenderness. -Restaging liver MRI on March 30, 2023 showed stable disease, no new lesions. -His tumor marker AFP remains to be negative (initially at 1479) -Repeated MRI from October 16, 2023 showed stable post embolization appearance of multiple liver lesions, and a slightly enlargement of in  arterially hyperenhancing lesion in segment 4A measuring 1.3 cm.  He was referred back to IR and underwent Y90 on 11/22/2023   Assessment and Plan    Hepatocellular Carcinoma (HCC) Post-radiation therapy follow-up. Reports significant fatigue, weakness, and 6-pound weight loss. No pain or fever. Normal bowel movements. Recent radiation more taxing than previous sessions. AFP tumor marker normal since 2022, indicating stable disease. Awaiting further imaging to assess treatment efficacy. MRI to be repeated in three months. Discussed whole body CT scan to check for metastases. Emphasized maintaining nutritional intake despite reduced appetite. - Repeat MRI in three months - Order whole body CT scan - Monitor AFP tumor marker - Follow-up in three months with labs and scans done a week prior  Diabetes Mellitus Diabetes management complicated by recent radiation therapy. Blood glucose levels highly variable (35-525 mg/dL). Recent fasting for procedures led to hypoglycemia. Advised to avoid insulin  when fasting. Emphasized adjusting insulin  doses based on blood glucose readings and maintaining energy levels. Recommended Glucerna over Ensure for better glucose management. - Continue current insulin  regimen with adjustments as needed - Monitor blood glucose levels closely - Consult dietician for nutritional support - Recommend Glucerna as a nutritional supplement  General Health Maintenance Advised to maintain hydration and nutritional intake. - Ensure adequate hydration - Encourage balanced diet and sufficient caloric intake  Plan -Patient reports significant fatigue after Y90 last week, we discussed diet and exercise, I anticipate that he will recover well in the next few weeks.  He knows to call back if not. - Follow-up in three months - Schedule labs and CT scans one week prior to follow-up -He will follow-up with IR with a repeated abdominal MRI. -  Update primary contact number to caregiver  for better communication.         SUMMARY OF ONCOLOGIC HISTORY: Oncology History Overview Note  Cancer Staging Hepatocellular carcinoma [facility]) Staging form: Liver, AJCC 8th Edition - Clinical stage from 03/08/2021: Stage IIIA (cT3, cN0, cM0) - Signed by Sonja [place], MD on 03/11/2021 Stage prefix: Initial diagnosis Histologic grade (G): G2 Histologic grading system: 4 grade system     Hepatocellular carcinoma (HCC)   Initial Diagnosis   Liver mass, right lobe   03/05/2021 Imaging   CT CAP  IMPRESSION: 1. Right hepatic lobe masses which are suspicious for either metastatic disease or primary malignancy such as hepatocellular carcinoma. These are suboptimally evaluated on this noncontrast exam. Consider multidisciplinary oncology consultation with potential clinical strategies of tissue sampling versus PET to direct sampling. 2. No primary malignancy identified within the chest, abdomen, or pelvis, given limitation of noncontrast exam. 3. Left nephrolithiasis. 4. Coronary artery atherosclerosis. Aortic Atherosclerosis (ICD10-I70.0). 5. Esophageal air fluid level suggests dysmotility or gastroesophageal reflux.   03/06/2021 Imaging   MRI Liver  IMPRESSION: 9.5 cm heterogeneous hypovascular mass in anterior right hepatic lobe, and 1.3 cm hypervascular mass in the posterior right hepatic lobe. Differential diagnosis includes liver metastases and multifocal hepatocellular carcinoma. Suggest correlation with serum AFP level, and possible tissue sampling.   No evidence of abdominal metastatic disease.   03/08/2021 Initial Biopsy   A. LIVER, RIGHT, BIOPSY:  - Hepatocellular carcinoma.   COMMENT:  The hepatocellular carcinoma is moderately differentiated and associated with areas of necrosis.  Immunohistochemistry shows positivity with glypican-3 and weak staining with HepPar 1.  The tumor is negative with arginase 1, alpha-fetoprotein, CDX2, cytokeratin 7, cytokeratin 20,  cytokeratin AE1/AE3, PSA, prostein and MOC31.  Polyclonal CEA shows focal canalicular pattern.  The morphology and immunophenotype are consistent with hepatocellular carcinoma.    03/08/2021 Cancer Staging   Staging form: Liver, AJCC 8th Edition - Clinical stage from 03/08/2021: Stage IIIA (cT3, cN0, cM0) - Signed by Sonja [place], MD on 03/11/2021 Stage prefix: Initial diagnosis Histologic grade (G): G2 Histologic grading system: 4 grade system   04/28/2021 - 10/13/2021 Chemotherapy   Patient is on Treatment Plan : LUNG Atezolizumab  + Bevacizumab  q21d Maintenance     06/16/2021 Imaging   CT Abdomen  IMPRESSION: Marked interval decrease in size of the large RIGHT hepatic lobe lesion, biopsy-proven hepatocellular carcinoma. No arterial phase enhancement on early phase, vague enhancement on later phase greater than 20 Hounsfield unit enhancement on venous phase. Less pronounced arterial enhancement than on previous imaging with small peripheral foci of enhancement on arterial phase particularly along the margin of the tumor near the dome of the liver (image 12/2) 11 mm focus of enhancement in this area.   Interval response to therapy of a small lesion previously compatible with LI-RADS category 5 lesion in the posterior RIGHT hepatic lobe.   No new hepatic lesion.   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   Signs of nephrolithiasis calculus on the LEFT measuring approximately 7 mm.   Colonic diverticulosis without evidence of acute diverticulitis.   Aortic Atherosclerosis (ICD10-I70.0).   11/02/2021 Imaging   EXAM: CT ABDOMEN AND PELVIS WITH CONTRAST   IMPRESSION: 1. Right hepatic mass has minimally decreased in size. No new focal hepatic masses are seen. 2. New small amount of ascites in the right upper quadrant. 3. Wall thickening of the cecum and ascending colon versus normal under distension. Correlate for nonspecific colitis. 4. Nonobstructing left renal  calculus. 5.  Aortic Atherosclerosis (ICD10-I70.0).   09/15/2022 Imaging   IMPRESSION: 1. No significant change in post treatment appearance of hepatic segment VIII, with a well circumscribed, intrinsically T1 hyperintense lesion of the liver dome as well as additional heterogeneous, intrinsic T1 hyperintensity just superiorly. LI-RADS TR, nonviable. 2. Following radioembolization, new background parenchymal hyperenhancement of hepatic segment VII, with faint rim enhancing, although internally nonenhancing lesions in the posterior liver dome corresponding to previously arterially hyperenhancing liver lesions. This is consistent with post treatment hyperemia without evidence of residual viable tumor. LI-RADS TR, nonviable. 3. Interval enlargement of an arterially hyperenhancing lesion in the anterior right lobe of the liver, near the margin of hepatic segment VIII, measuring 0.8 x 0.8 cm, previously no greater than 0.4 cm. This does not demonstrate clear evidence of washout or capsular enhancement. LI-RADS category 4 due to threshold growth.      Imaging     09/15/2022 Imaging    IMPRESSION: 1. No significant change in post treatment appearance of hepatic segment VIII, with a well circumscribed, intrinsically T1 hyperintense lesion of the liver dome as well as additional heterogeneous, intrinsic T1 hyperintensity just superiorly. LI-RADS TR, nonviable. 2. Following radioembolization, new background parenchymal hyperenhancement of hepatic segment VII, with faint rim enhancing, although internally nonenhancing lesions in the posterior liver dome corresponding to previously arterially hyperenhancing liver lesions. This is consistent with post treatment hyperemia without evidence of residual viable tumor. LI-RADS TR, nonviable. 3. Interval enlargement of an arterially hyperenhancing lesion in the anterior right lobe of the liver, near the margin of hepatic segment VIII, measuring  0.8 x 0.8 cm, previously no greater than 0.4 cm. This does not demonstrate clear evidence of washout or capsular enhancement. LI-RADS category 4 due to threshold growth.     12/18/2022 Imaging    IMPRESSION: 1. Stable appearance of the previously treated segment VIII lesion. LI-RADS TR, nonviable. 2. Stable appearance of the index lesions in the posterior right hepatic dome. As before, there is early heterogeneous but relatively diffuse enhancement in the posterior right hepatic dome, likely treatment related. Index lesions documented previously are similar in size and enhancement characteristics without suspicious enhancement characteristics. LI-RADS TR, nonviable. 3. Small lesion showing arterial phase hyperenhancement in the anterior right liver (segment II right) persists. As before there is no definite washout and no enhancing capsule associated with this lesion. Given lack of interval growth, this lesion is downgraded to LI-RADS 3. Close continued follow-up recommended. 4. No new or progressive findings in the abdomen.        Discussed the use of AI scribe software for clinical note transcription with the patient, who gave verbal consent to proceed.  History of Present Illness   The patient, a 78 year old gentleman with a history of Hepatocellular Carcinoma (HCC), presents with significant fatigue and weight loss following recent radiation therapy. The patient reports feeling "very, very" tired and has been bedridden since the procedure. Despite forcing himself to eat, he has lost six pounds. The patient's family member corroborates this, noting that the patient's condition has been "going downhill" since the radiation therapy.  In addition to the fatigue and weight loss, the patient's blood sugar levels have been difficult to control. The patient's family member reports that his blood sugar levels have fluctuated significantly, reaching as high as 525 and dropping as low as 35.  The patient has been managing his diabetes with insulin , adjusting the dosage based on his blood sugar levels and food intake. However, the patient's family  member expresses concern about the patient's ability to manage his diabetes effectively, particularly when he has to fast for medical procedures.  The patient also reports feeling weak and experiencing a "weird" sensation in his head since the radiation therapy. He describes the effects of the radiation therapy as having "taken a lot from me." Despite these symptoms, the patient is able to take care of himself at home and has been forcing himself to eat to maintain his strength.         All other systems were reviewed with the patient and are negative.  MEDICAL HISTORY:  Past Medical History:  Diagnosis Date   Depression    Diabetes mellitus without complication (HCC)    Hepatocellular carcinoma (HCC) 03/08/2021   Parasite infestation     SURGICAL HISTORY: Past Surgical History:  Procedure Laterality Date   IR 3D INDEPENDENT WKST  05/25/2022   IR 3D INDEPENDENT WKST  11/09/2023   IR ANGIOGRAM SELECTIVE EACH ADDITIONAL VESSEL  07/01/2021   IR ANGIOGRAM SELECTIVE EACH ADDITIONAL VESSEL  07/22/2021   IR ANGIOGRAM SELECTIVE EACH ADDITIONAL VESSEL  07/22/2021   IR ANGIOGRAM SELECTIVE EACH ADDITIONAL VESSEL  07/22/2021   IR ANGIOGRAM SELECTIVE EACH ADDITIONAL VESSEL  07/22/2021   IR ANGIOGRAM SELECTIVE EACH ADDITIONAL VESSEL  05/25/2022   IR ANGIOGRAM SELECTIVE EACH ADDITIONAL VESSEL  05/25/2022   IR ANGIOGRAM SELECTIVE EACH ADDITIONAL VESSEL  05/25/2022   IR ANGIOGRAM SELECTIVE EACH ADDITIONAL VESSEL  05/25/2022   IR ANGIOGRAM SELECTIVE EACH ADDITIONAL VESSEL  05/25/2022   IR ANGIOGRAM SELECTIVE EACH ADDITIONAL VESSEL  05/25/2022   IR ANGIOGRAM SELECTIVE EACH ADDITIONAL VESSEL  06/15/2022   IR ANGIOGRAM SELECTIVE EACH ADDITIONAL VESSEL  06/15/2022   IR ANGIOGRAM SELECTIVE EACH ADDITIONAL VESSEL  06/15/2022   IR ANGIOGRAM SELECTIVE EACH ADDITIONAL VESSEL   11/09/2023   IR ANGIOGRAM SELECTIVE EACH ADDITIONAL VESSEL  11/09/2023   IR ANGIOGRAM SELECTIVE EACH ADDITIONAL VESSEL  11/22/2023   IR ANGIOGRAM SELECTIVE EACH ADDITIONAL VESSEL  11/22/2023   IR ANGIOGRAM VISCERAL SELECTIVE  07/01/2021   IR ANGIOGRAM VISCERAL SELECTIVE  07/22/2021   IR ANGIOGRAM VISCERAL SELECTIVE  05/25/2022   IR ANGIOGRAM VISCERAL SELECTIVE  06/15/2022   IR ANGIOGRAM VISCERAL SELECTIVE  11/09/2023   IR ANGIOGRAM VISCERAL SELECTIVE  11/09/2023   IR ANGIOGRAM VISCERAL SELECTIVE  11/22/2023   IR EMBO ARTERIAL NOT HEMORR HEMANG INC GUIDE ROADMAPPING  07/01/2021   IR EMBO TUMOR ORGAN ISCHEMIA INFARCT INC GUIDE ROADMAPPING  07/22/2021   IR EMBO TUMOR ORGAN ISCHEMIA INFARCT INC GUIDE ROADMAPPING  06/15/2022   IR EMBO TUMOR ORGAN ISCHEMIA INFARCT INC GUIDE ROADMAPPING  11/22/2023   IR RADIOLOGIST EVAL & MGMT  06/01/2021   IR RADIOLOGIST EVAL & MGMT  04/26/2022   IR RADIOLOGIST EVAL & MGMT  07/12/2022   IR RADIOLOGIST EVAL & MGMT  10/05/2023   IR US  GUIDE VASC ACCESS RIGHT  07/01/2021   IR US  GUIDE VASC ACCESS RIGHT  07/22/2021   IR US  GUIDE VASC ACCESS RIGHT  05/25/2022   IR US  GUIDE VASC ACCESS RIGHT  06/15/2022   IR US  GUIDE VASC ACCESS RIGHT  11/09/2023   IR US  GUIDE VASC ACCESS RIGHT  11/22/2023   NO PAST SURGERIES      I have reviewed the social history and family history with the patient and they are unchanged from previous note.  ALLERGIES:  has no known allergies.  MEDICATIONS:  Current Outpatient Medications  Medication Sig Dispense Refill   Ascorbic Acid  (VITAMIN C) 100 MG  tablet Take 100 mg by mouth daily.     blood glucose meter kit and supplies Dispense based on patient and insurance preference. Use up to four times daily as directed. (FOR ICD-10 E10.9, E11.9). 1 each 0   insulin  glargine (LANTUS) 100 UNIT/ML injection 40 Units daily.     Insulin  Pen Needle (PEN NEEDLES 3/16") 31G X 5 MM MISC 1 each by Does not apply route with breakfast, with lunch, and with evening meal. 100 each 3    Multiple Vitamin (MULTIVITAMIN WITH MINERALS) TABS tablet Take 1 tablet by mouth daily.     ondansetron  (ZOFRAN ) 8 MG tablet Take 1 tablet (8 mg total) by mouth every 8 (eight) hours as needed for nausea or vomiting. 30 tablet 0   pantoprazole  (PROTONIX ) 40 MG tablet Take 1 tablet (40 mg total) by mouth daily. 30 tablet 1   polyethylene glycol (MIRALAX  / GLYCOLAX ) 17 g packet Take 17 g by mouth daily as needed for mild constipation. 14 each 0   prochlorperazine  (COMPAZINE ) 10 MG tablet Take 1 tablet (10 mg total) by mouth every 6 (six) hours as needed for nausea or vomiting. 30 tablet 0   senna (SENOKOT) 8.6 MG TABS tablet Take 1 tablet (8.6 mg total) by mouth 2 (two) times daily. (Patient taking differently: Take 1 tablet by mouth 2 (two) times daily as needed for moderate constipation.) 120 tablet 0   vitamin B-12 (CYANOCOBALAMIN ) 100 MCG tablet Take 100 mcg by mouth daily.     No current facility-administered medications for this visit.   Facility-Administered Medications Ordered in Other Visits  Medication Dose Route Frequency Provider Last Rate Last Admin   0.9 %  sodium chloride  infusion   Intravenous Continuous Sonja , MD        PHYSICAL EXAMINATION: ECOG PERFORMANCE STATUS: 2 - Symptomatic, <50% confined to bed  Vitals:   11/27/23 1310  BP: 136/83  Pulse: 84  Resp: 16  Temp: 98.1 F (36.7 C)  SpO2: 100%   Wt Readings from Last 3 Encounters:  11/27/23 56.1 kg  11/22/23 57.8 kg  11/09/23 57.6 kg     GENERAL:alert, no distress and comfortable SKIN: skin color, texture, turgor are normal, no rashes or significant lesions EYES: normal, Conjunctiva are pink and non-injected, sclera clear NECK: supple, thyroid normal size, non-tender, without nodularity LYMPH:  no palpable lymphadenopathy in the cervical, axillary  LUNGS: clear to auscultation and percussion with normal breathing effort HEART: regular rate & rhythm and no murmurs and no lower extremity  edema ABDOMEN:abdomen soft, non-tender and normal bowel sounds Musculoskeletal:no cyanosis of digits and no clubbing  NEURO: alert & oriented x 3 with fluent speech, no focal motor/sensory deficits   LABORATORY DATA:  I have reviewed the data as listed    Latest Ref Rng & Units 11/27/2023   12:53 PM 11/22/2023    8:30 AM 11/09/2023    8:15 AM  CBC  WBC 4.0 - 10.5 K/uL 5.0  4.4  4.8   Hemoglobin 13.0 - 17.0 g/dL 16.1  09.6  04.5   Hematocrit 39.0 - 52.0 % 39.6  39.7  38.9   Platelets 150 - 400 K/uL 119  104  107         Latest Ref Rng & Units 11/27/2023   12:53 PM 11/22/2023    8:30 AM 11/09/2023    8:15 AM  CMP  Glucose 70 - 99 mg/dL 409  811  914   BUN 8 - 23 mg/dL 18  17  14   Creatinine 0.61 - 1.24 mg/dL 1.61  0.96  0.45   Sodium 135 - 145 mmol/L 131  136  135   Potassium 3.5 - 5.1 mmol/L 4.7  4.4  3.9   Chloride 98 - 111 mmol/L 98  99  100   CO2 22 - 32 mmol/L 29  31  24    Calcium 8.9 - 10.3 mg/dL 9.5  9.6  9.3   Total Protein 6.5 - 8.1 g/dL 7.7  8.1  7.3   Total Bilirubin 0.0 - 1.2 mg/dL 1.0  0.8  1.1   Alkaline Phos 38 - 126 U/L 69  48  43   AST 15 - 41 U/L 38  36  37   ALT 0 - 44 U/L 34  32  33       RADIOGRAPHIC STUDIES: I have personally reviewed the radiological images as listed and agreed with the findings in the report. No results found.    Orders Placed This Encounter  Procedures   CT CHEST ABDOMEN PELVIS W CONTRAST    Standing Status:   Future    Expected Date:   02/24/2024    Expiration Date:   11/26/2024    If indicated for the ordered procedure, I authorize the administration of contrast media per Radiology protocol:   Yes    Does the patient have a contrast media/X-ray dye allergy?:   No    Preferred imaging location?:   [facility]    If indicated for the ordered procedure, I authorize the administration of oral contrast media per Radiology protocol:   Yes   All questions were answered. The patient knows to call the clinic with any  problems, questions or concerns. No barriers to learning was detected. The total time spent in the appointment was 30 minutes.     Sonja [place], MD 11/27/2023

## 2023-11-28 LAB — AFP TUMOR MARKER: AFP, Serum, Tumor Marker: 1.9 ng/mL (ref 0.0–8.4)

## 2024-02-23 ENCOUNTER — Inpatient Hospital Stay: Payer: 59 | Attending: Hematology

## 2024-02-23 ENCOUNTER — Ambulatory Visit (HOSPITAL_COMMUNITY)

## 2024-02-23 DIAGNOSIS — Z79899 Other long term (current) drug therapy: Secondary | ICD-10-CM | POA: Diagnosis not present

## 2024-02-23 DIAGNOSIS — D61818 Other pancytopenia: Secondary | ICD-10-CM | POA: Diagnosis not present

## 2024-02-23 DIAGNOSIS — Z8505 Personal history of malignant neoplasm of liver: Secondary | ICD-10-CM | POA: Diagnosis present

## 2024-02-23 DIAGNOSIS — Z794 Long term (current) use of insulin: Secondary | ICD-10-CM | POA: Insufficient documentation

## 2024-02-23 DIAGNOSIS — E1065 Type 1 diabetes mellitus with hyperglycemia: Secondary | ICD-10-CM | POA: Insufficient documentation

## 2024-02-23 DIAGNOSIS — C220 Liver cell carcinoma: Secondary | ICD-10-CM

## 2024-02-23 LAB — CMP (CANCER CENTER ONLY)
ALT: 28 U/L (ref 0–44)
AST: 35 U/L (ref 15–41)
Albumin: 3.8 g/dL (ref 3.5–5.0)
Alkaline Phosphatase: 73 U/L (ref 38–126)
Anion gap: 5 (ref 5–15)
BUN: 14 mg/dL (ref 8–23)
CO2: 28 mmol/L (ref 22–32)
Calcium: 9.1 mg/dL (ref 8.9–10.3)
Chloride: 99 mmol/L (ref 98–111)
Creatinine: 0.82 mg/dL (ref 0.61–1.24)
GFR, Estimated: >60 mL/min (ref >60)
Glucose, Bld: 432 mg/dL — ABNORMAL HIGH (ref 70–99)
Potassium: 4.2 mmol/L (ref 3.5–5.1)
Sodium: 132 mmol/L — ABNORMAL LOW (ref 135–145)
Total Bilirubin: 0.6 mg/dL (ref 0.0–1.2)
Total Protein: 7.2 g/dL (ref 6.5–8.1)

## 2024-02-23 LAB — CBC WITH DIFFERENTIAL (CANCER CENTER ONLY)
Abs Immature Granulocytes: 0.01 10*3/uL (ref 0.00–0.07)
Basophils Absolute: 0 10*3/uL (ref 0.0–0.1)
Basophils Relative: 1 %
Eosinophils Absolute: 0.1 10*3/uL (ref 0.0–0.5)
Eosinophils Relative: 3 %
HCT: 36.1 % — ABNORMAL LOW (ref 39.0–52.0)
Hemoglobin: 12.5 g/dL — ABNORMAL LOW (ref 13.0–17.0)
Immature Granulocytes: 0 %
Lymphocytes Relative: 14 %
Lymphs Abs: 0.4 10*3/uL — ABNORMAL LOW (ref 0.7–4.0)
MCH: 30 pg (ref 26.0–34.0)
MCHC: 34.6 g/dL (ref 30.0–36.0)
MCV: 86.6 fL (ref 80.0–100.0)
Monocytes Absolute: 0.4 10*3/uL (ref 0.1–1.0)
Monocytes Relative: 13 %
Neutro Abs: 2.2 10*3/uL (ref 1.7–7.7)
Neutrophils Relative %: 69 %
Platelet Count: 87 10*3/uL — ABNORMAL LOW (ref 150–400)
RBC: 4.17 MIL/uL — ABNORMAL LOW (ref 4.22–5.81)
RDW: 12.4 % (ref 11.5–15.5)
WBC Count: 3.2 10*3/uL — ABNORMAL LOW (ref 4.0–10.5)
nRBC: 0 % (ref 0.0–0.2)

## 2024-02-28 ENCOUNTER — Telehealth: Payer: Self-pay | Admitting: Nurse Practitioner

## 2024-02-29 ENCOUNTER — Inpatient Hospital Stay: Payer: 59 | Admitting: Hematology

## 2024-03-05 ENCOUNTER — Other Ambulatory Visit: Payer: Self-pay | Admitting: Nurse Practitioner

## 2024-03-05 DIAGNOSIS — C220 Liver cell carcinoma: Secondary | ICD-10-CM

## 2024-03-05 NOTE — Assessment & Plan Note (Addendum)
 cT3N0M, stage IIIA    -diagnosed in 02/2021, liver biopsy 03/08/21 confirmed moderately differentiated hepatocellular carcinoma. No nodal or distant metastasis on CT 03/05/21.  --He is not a candidate for liver resection or transplant. Dr. [name] at [facility] did not recommend surgery given the location and extent of disease. -He started atezolizumab  and bevacizumab  on 04/28/21, held after 09/2021 due to newly diagnosed type 1 DM with hyperglycemia which was probably related to immunotherapy. --he underwent segment 8 Y90 on 07/22/21 and 06/15/2022  -he had good response to therapies  -Reviewed his restaging CT 09/15/2022 showed stable treated liver lesion, and a enlarging liver lesion in segment VIII (from 4mm to 8mm, LI-RADS category 4) -restaging MRI reviewed, which showed a stable treated liver lesion, previously LI-RADS [name] category 4 lesion in right lobe of liver is overall stable in size, no definitive washout and enhancing capsule, it is considered LI RADS 3 now.  No additional new lesion. -Patient has developed significant intermittent pain in the right upper quadrant of abdomen, exam showed enlarged left lobe of liver, mild tenderness. -Restaging liver MRI on March 30, 2023 showed stable disease, no new lesions. -His tumor marker AFP remains to be negative (initially at 1479) -Repeated MRI from October 16, 2023 showed stable post embolization appearance of multiple liver lesions, and a slightly enlargement of in arterially hyperenhancing lesion in segment 4A measuring 1.3 cm.  He was referred back to IR and underwent Y90 on 11/22/2023 -due to have repeat CT CAP and labs. These were ordered during today's visit. He feels well without complaints today.

## 2024-03-05 NOTE — Progress Notes (Signed)
 Patient Care Team: [name], MD as PCP - General (Internal Medicine) Sonja , MD as Consulting Physician (Hematology and Oncology) [name], RN (Inactive) as Oncology Nurse Navigator  Clinic Day:  03/06/2024  Referring physician: [name], MD  ASSESSMENT & PLAN:   Assessment & Plan: Hepatocellular carcinoma (HCC) cT3N0M, stage IIIA    -diagnosed in 02/2021, liver biopsy 03/08/21 confirmed moderately differentiated hepatocellular carcinoma. No nodal or distant metastasis on CT 03/05/21.  --He is not a candidate for liver resection or transplant. Dr. [name] at [facility] did not recommend surgery given the location and extent of disease. -He started atezolizumab  and bevacizumab  on 04/28/21, held after 09/2021 due to newly diagnosed type 1 DM with hyperglycemia which was probably related to immunotherapy. --he underwent segment 8 Y90 on 07/22/21 and 06/15/2022  -he had good response to therapies  -Reviewed his restaging CT 09/15/2022 showed stable treated liver lesion, and a enlarging liver lesion in segment VIII (from 4mm to 8mm, LI-RADS category 4) -restaging MRI reviewed, which showed a stable treated liver lesion, previously LI-RADS [name] category 4 lesion in right lobe of liver is overall stable in size, no definitive washout and enhancing capsule, it is considered LI RADS 3 now.  No additional new lesion. -Patient has developed significant intermittent pain in the right upper quadrant of abdomen, exam showed enlarged left lobe of liver, mild tenderness. -Restaging liver MRI on March 30, 2023 showed stable disease, no new lesions. -His tumor marker AFP remains to be negative (initially at 1479) -Repeated MRI from October 16, 2023 showed stable post embolization appearance of multiple liver lesions, and a slightly enlargement of in arterially hyperenhancing lesion in segment 4A measuring 1.3 cm.  He was referred back to IR and underwent Y90 on 11/22/2023 -due to have repeat CT CAP  and labs. These were ordered during today's visit. He feels well without complaints today.    Diabetes Patient is insulin  dependant diabetic. Takes long acting insulin  in mornings and uses short acting insulin  prior to meals as needed. CMP on 02/23/2024 showed very high glucose at 432. Recheck today. Advised patient to discuss management of diabetes with primary care provider.   Mild pancytopenia  Labs done 02/23/2024 showed WBC 3.2, Hgb 12.5, Hct 36.1, and platelet count 87.  Previously ,these labs had normalized (except platelet count). Recheck today. Consider adding oral iron.   Hepatocellular carcinoma Received treatment with y-90 on 12/12/2023. Order new CT CAP and MRI of liver for surveillance. AFP tumor marker ordered. Will notify patient of results and recommended plan.  The patient understands the plans discussed today and is in agreement with them.  He knows to contact our office if he develops concerns prior to his next appointment.  Plan: Labs reviewed  -mild pancytopenia with worsening platelet count. Recheck today.  -high blood glucose. Recommend discussion with primary care provider to better manage diabetes -Check AFP tumor marker today  Reorder CT CAP for surveillance  MRI liver ordered for surveillance  Contact the patient with results of labs and imaging.  Pan for labs and follow up in 3 months, sooner if needed   I provided 25 minutes of face-to-face time during this encounter and > 50% was spent counseling as documented under my assessment and plan.    [name], NP  [facility] - A DEPT OF [facility] [address] [place] [address] Dept: [phone] Dept Fax: [phone]  Orders Placed This Encounter  Procedures   CT CHEST ABDOMEN PELVIS W CONTRAST    Standing Status:   Future    Expected Date:   03/13/2024    Expiration Date:   03/06/2025    If indicated for the ordered procedure, I  authorize the administration of contrast media per Radiology protocol:   Yes    Does the patient have a contrast media/X-ray dye allergy?:   No    Preferred imaging location?:   [facility]    If indicated for the ordered procedure, I authorize the administration of oral contrast media per Radiology protocol:   Yes   MR Abdomen W Wo Contrast    Standing Status:   Future    Expected Date:   03/13/2024    Expiration Date:   03/06/2025    If indicated for the ordered procedure, I authorize the administration of contrast media per Radiology protocol:   Yes    What is the patient's sedation requirement?:   No Sedation    Does the patient have a pacemaker or implanted devices?:   No    Preferred imaging location?:   [facility] (table limit - 550 lbs)      CHIEF COMPLAINT:  CC: History of hepatocellular carcinoma  Current Treatment: Cancer surveillance  INTERVAL HISTORY:  Shawn Bailey is here today for repeat clinical assessment.  He was last seen by [name] on 11/27/2023.  He was having significant fatigue after Y90 procedure on 11/22/2023.  He is due to have CT CAP and MRI of the abdomen for surveillance. Scans were ordered today. Fluctuation blood sugars. Feels good. No complaints. He denies chest pain, chest pressure, or shortness of breath. He denies headaches or visual disturbances. He denies abdominal pain, nausea, vomiting, or changes in bowel or bladder habits. He denies fevers or chills. He denies pain. His appetite is good. His weight has increased 8 pounds over last 3 months.  I have reviewed the past medical history, past surgical history, social history and family history with the patient and they are unchanged from previous note.  ALLERGIES:  has no known allergies.  MEDICATIONS:  Current Outpatient Medications  Medication Sig Dispense Refill   Ascorbic Acid  (VITAMIN C) 100 MG tablet Take 100 mg by mouth daily.     blood glucose meter kit and supplies Dispense based  on patient and insurance preference. Use up to four times daily as directed. (FOR ICD-10 E10.9, E11.9). 1 each 0   insulin  glargine (LANTUS) 100 UNIT/ML injection 40 Units daily.     Insulin  Pen Needle (PEN NEEDLES 3/16") 31G X 5 MM MISC 1 each by Does not apply route with breakfast, with lunch, and with evening meal. 100 each 3   Multiple Vitamin (MULTIVITAMIN WITH MINERALS) TABS tablet Take 1 tablet by mouth daily.     polyethylene glycol (MIRALAX  / GLYCOLAX ) 17 g packet Take 17 g by mouth daily as needed for mild constipation. 14 each 0   prochlorperazine  (COMPAZINE ) 10 MG tablet Take 1 tablet (10 mg total) by mouth every 6 (six) hours as needed for nausea or vomiting. 30 tablet 0   senna (SENOKOT) 8.6 MG TABS tablet Take 1 tablet (8.6 mg total) by mouth 2 (two) times daily. (Patient taking differently: Take 1 tablet by mouth 2 (two) times daily as needed for moderate constipation.) 120 tablet 0   vitamin B-12 (CYANOCOBALAMIN ) 100 MCG tablet Take 100 mcg by mouth daily.     ondansetron  (ZOFRAN )  8 MG tablet Take 1 tablet (8 mg total) by mouth every 8 (eight) hours as needed for nausea or vomiting. (Patient not taking: Reported on 03/06/2024) 30 tablet 0   pantoprazole  (PROTONIX ) 40 MG tablet Take 1 tablet (40 mg total) by mouth daily. (Patient not taking: Reported on 03/06/2024) 30 tablet 1   No current facility-administered medications for this visit.   Facility-Administered Medications Ordered in Other Visits  Medication Dose Route Frequency Provider Last Rate Last Admin   0.9 %  sodium chloride  infusion   Intravenous Continuous Sonja Springer, MD        HISTORY OF PRESENT ILLNESS:   Oncology History Overview Note  Cancer Staging Hepatocellular carcinoma [facility]) Staging form: Liver, AJCC 8th Edition - Clinical stage from 03/08/2021: Stage IIIA (cT3, cN0, cM0) - Signed by Sonja Bigelow, MD on 03/11/2021 Stage prefix: Initial diagnosis Histologic grade (G): G2 Histologic grading system: 4 grade  system     Hepatocellular carcinoma (HCC)   Initial Diagnosis   Liver mass, right lobe   03/05/2021 Imaging   CT CAP  IMPRESSION: 1. Right hepatic lobe masses which are suspicious for either metastatic disease or primary malignancy such as hepatocellular carcinoma. These are suboptimally evaluated on this noncontrast exam. Consider multidisciplinary oncology consultation with potential clinical strategies of tissue sampling versus PET to direct sampling. 2. No primary malignancy identified within the chest, abdomen, or pelvis, given limitation of noncontrast exam. 3. Left nephrolithiasis. 4. Coronary artery atherosclerosis. Aortic Atherosclerosis (ICD10-I70.0). 5. Esophageal air fluid level suggests dysmotility or gastroesophageal reflux.   03/06/2021 Imaging   MRI Liver  IMPRESSION: 9.5 cm heterogeneous hypovascular mass in anterior right hepatic lobe, and 1.3 cm hypervascular mass in the posterior right hepatic lobe. Differential diagnosis includes liver metastases and multifocal hepatocellular carcinoma. Suggest correlation with serum AFP level, and possible tissue sampling.   No evidence of abdominal metastatic disease.   03/08/2021 Initial Biopsy   A. LIVER, RIGHT, BIOPSY:  - Hepatocellular carcinoma.   COMMENT:  The hepatocellular carcinoma is moderately differentiated and associated with areas of necrosis.  Immunohistochemistry shows positivity with glypican-3 and weak staining with HepPar 1.  The tumor is negative with arginase 1, alpha-fetoprotein, CDX2, cytokeratin 7, cytokeratin 20, cytokeratin AE1/AE3, PSA, prostein and MOC31.  Polyclonal CEA shows focal canalicular pattern.  The morphology and immunophenotype are consistent with hepatocellular carcinoma.    03/08/2021 Cancer Staging   Staging form: Liver, AJCC 8th Edition - Clinical stage from 03/08/2021: Stage IIIA (cT3, cN0, cM0) - Signed by Sonja [place], MD on 03/11/2021 Stage prefix: Initial diagnosis Histologic  grade (G): G2 Histologic grading system: 4 grade system   04/28/2021 - 10/13/2021 Chemotherapy   Patient is on Treatment Plan : LUNG Atezolizumab  + Bevacizumab  q21d Maintenance     06/16/2021 Imaging   CT Abdomen  IMPRESSION: Marked interval decrease in size of the large RIGHT hepatic lobe lesion, biopsy-proven hepatocellular carcinoma. No arterial phase enhancement on early phase, vague enhancement on later phase greater than 20 Hounsfield unit enhancement on venous phase. Less pronounced arterial enhancement than on previous imaging with small peripheral foci of enhancement on arterial phase particularly along the margin of the tumor near the dome of the liver (image 12/2) 11 mm focus of enhancement in this area.   Interval response to therapy of a small lesion previously compatible with LI-RADS category 5 lesion in the posterior RIGHT hepatic lobe.   No new hepatic lesion.   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  Signs of nephrolithiasis calculus on the LEFT measuring approximately 7 mm.   Colonic diverticulosis without evidence of acute diverticulitis.   Aortic Atherosclerosis (ICD10-I70.0).   11/02/2021 Imaging   EXAM: CT ABDOMEN AND PELVIS WITH CONTRAST   IMPRESSION: 1. Right hepatic mass has minimally decreased in size. No new focal hepatic masses are seen. 2. New small amount of ascites in the right upper quadrant. 3. Wall thickening of the cecum and ascending colon versus normal under distension. Correlate for nonspecific colitis. 4. Nonobstructing left renal calculus. 5.  Aortic Atherosclerosis (ICD10-I70.0).   09/15/2022 Imaging   IMPRESSION: 1. No significant change in post treatment appearance of hepatic segment VIII, with a well circumscribed, intrinsically T1 hyperintense lesion of the liver dome as well as additional heterogeneous, intrinsic T1 hyperintensity just superiorly. LI-RADS TR, nonviable. 2. Following radioembolization,  new background parenchymal hyperenhancement of hepatic segment VII, with faint rim enhancing, although internally nonenhancing lesions in the posterior liver dome corresponding to previously arterially hyperenhancing liver lesions. This is consistent with post treatment hyperemia without evidence of residual viable tumor. LI-RADS TR, nonviable. 3. Interval enlargement of an arterially hyperenhancing lesion in the anterior right lobe of the liver, near the margin of hepatic segment VIII, measuring 0.8 x 0.8 cm, previously no greater than 0.4 cm. This does not demonstrate clear evidence of washout or capsular enhancement. LI-RADS category 4 due to threshold growth.      Imaging     09/15/2022 Imaging    IMPRESSION: 1. No significant change in post treatment appearance of hepatic segment VIII, with a well circumscribed, intrinsically T1 hyperintense lesion of the liver dome as well as additional heterogeneous, intrinsic T1 hyperintensity just superiorly. LI-RADS TR, nonviable. 2. Following radioembolization, new background parenchymal hyperenhancement of hepatic segment VII, with faint rim enhancing, although internally nonenhancing lesions in the posterior liver dome corresponding to previously arterially hyperenhancing liver lesions. This is consistent with post treatment hyperemia without evidence of residual viable tumor. LI-RADS TR, nonviable. 3. Interval enlargement of an arterially hyperenhancing lesion in the anterior right lobe of the liver, near the margin of hepatic segment VIII, measuring 0.8 x 0.8 cm, previously no greater than 0.4 cm. This does not demonstrate clear evidence of washout or capsular enhancement. LI-RADS category 4 due to threshold growth.     12/18/2022 Imaging    IMPRESSION: 1. Stable appearance of the previously treated segment VIII lesion. LI-RADS TR, nonviable. 2. Stable appearance of the index lesions in the posterior right hepatic dome. As  before, there is early heterogeneous but relatively diffuse enhancement in the posterior right hepatic dome, likely treatment related. Index lesions documented previously are similar in size and enhancement characteristics without suspicious enhancement characteristics. LI-RADS TR, nonviable. 3. Small lesion showing arterial phase hyperenhancement in the anterior right liver (segment II right) persists. As before there is no definite washout and no enhancing capsule associated with this lesion. Given lack of interval growth, this lesion is downgraded to LI-RADS 3. Close continued follow-up recommended. 4. No new or progressive findings in the abdomen.         REVIEW OF SYSTEMS:   Constitutional: Denies fevers, chills or abnormal weight loss Eyes: Denies blurriness of vision Ears, nose, mouth, throat, and face: Denies mucositis or sore throat Respiratory: Denies cough, dyspnea or wheezes Cardiovascular: Denies palpitation, chest discomfort or lower extremity swelling Gastrointestinal:  Denies nausea, heartburn or change in bowel habits Skin: Denies abnormal skin rashes Lymphatics: Denies new lymphadenopathy or easy bruising Neurological:Denies numbness, tingling or new  weaknesses Behavioral/Psych: Mood is stable, no new changes  All other systems were reviewed with the patient and are negative.   VITALS:   Today's Vitals   03/06/24 0754 03/06/24 0832  BP: 138/86   Pulse: 77   Resp: 17   Temp: 97.9 F (36.6 C)   SpO2: 98%   Weight: 131 lb 14.4 oz (59.8 kg)   PainSc:  0-No pain   Body mass index is 21.95 kg/m.   Wt Readings from Last 3 Encounters:  03/06/24 131 lb 14.4 oz (59.8 kg)  11/27/23 123 lb 11.2 oz (56.1 kg)  11/22/23 127 lb 6.8 oz (57.8 kg)    Body mass index is 21.95 kg/m.  Performance status (ECOG): 1 - Symptomatic but completely ambulatory  PHYSICAL EXAM:   GENERAL:alert, no distress and comfortable SKIN: skin color, texture, turgor are normal, no  rashes or significant lesions EYES: normal, Conjunctiva are pink and non-injected, sclera clear OROPHARYNX:no exudate, no erythema and lips, buccal mucosa, and tongue normal  NECK: supple, thyroid normal size, non-tender, without nodularity LYMPH:  no palpable lymphadenopathy in the cervical, axillary or inguinal LUNGS: clear to auscultation and percussion with normal breathing effort HEART: regular rate & rhythm and no murmurs and no lower extremity edema ABDOMEN:abdomen soft, non-tender and normal bowel sounds Musculoskeletal:no cyanosis of digits and no clubbing  NEURO: alert & oriented x 3 with fluent speech, no focal motor/sensory deficits  LABORATORY DATA:  I have reviewed the data as listed    Component Value Date/Time   NA 132 (L) 02/23/2024 0903   K 4.2 02/23/2024 0903   CL 99 02/23/2024 0903   CO2 28 02/23/2024 0903   GLUCOSE 432 (H) 02/23/2024 0903   BUN 14 02/23/2024 0903   CREATININE 0.82 02/23/2024 0903   CALCIUM 9.1 02/23/2024 0903   PROT 7.2 02/23/2024 0903   ALBUMIN  3.8 02/23/2024 0903   AST 35 02/23/2024 0903   ALT 28 02/23/2024 0903   ALKPHOS 73 02/23/2024 0903   BILITOT 0.6 02/23/2024 0903   GFRNONAA >60 02/23/2024 0903   GFRAA >60 02/08/2016 1435    Lab Results  Component Value Date   WBC 3.7 (L) 03/06/2024   NEUTROABS 2.5 03/06/2024   HGB 13.0 03/06/2024   HCT 37.7 (L) 03/06/2024   MCV 87.5 03/06/2024   PLT 95 (L) 03/06/2024

## 2024-03-06 ENCOUNTER — Inpatient Hospital Stay

## 2024-03-06 ENCOUNTER — Encounter: Payer: Self-pay | Admitting: Nurse Practitioner

## 2024-03-06 ENCOUNTER — Inpatient Hospital Stay (HOSPITAL_BASED_OUTPATIENT_CLINIC_OR_DEPARTMENT_OTHER): Admitting: Nurse Practitioner

## 2024-03-06 VITALS — BP 138/86 | HR 77 | Temp 97.9°F | Resp 17 | Wt 131.9 lb

## 2024-03-06 DIAGNOSIS — C220 Liver cell carcinoma: Secondary | ICD-10-CM

## 2024-03-06 DIAGNOSIS — Z8505 Personal history of malignant neoplasm of liver: Secondary | ICD-10-CM | POA: Diagnosis not present

## 2024-03-06 LAB — CBC WITH DIFFERENTIAL (CANCER CENTER ONLY)
Abs Immature Granulocytes: 0.01 10*3/uL (ref 0.00–0.07)
Basophils Absolute: 0 10*3/uL (ref 0.0–0.1)
Basophils Relative: 1 %
Eosinophils Absolute: 0.1 10*3/uL (ref 0.0–0.5)
Eosinophils Relative: 2 %
HCT: 37.7 % — ABNORMAL LOW (ref 39.0–52.0)
Hemoglobin: 13 g/dL (ref 13.0–17.0)
Immature Granulocytes: 0 %
Lymphocytes Relative: 15 %
Lymphs Abs: 0.6 10*3/uL — ABNORMAL LOW (ref 0.7–4.0)
MCH: 30.2 pg (ref 26.0–34.0)
MCHC: 34.5 g/dL (ref 30.0–36.0)
MCV: 87.5 fL (ref 80.0–100.0)
Monocytes Absolute: 0.5 10*3/uL (ref 0.1–1.0)
Monocytes Relative: 15 %
Neutro Abs: 2.5 10*3/uL (ref 1.7–7.7)
Neutrophils Relative %: 67 %
Platelet Count: 95 10*3/uL — ABNORMAL LOW (ref 150–400)
RBC: 4.31 MIL/uL (ref 4.22–5.81)
RDW: 12.4 % (ref 11.5–15.5)
WBC Count: 3.7 10*3/uL — ABNORMAL LOW (ref 4.0–10.5)
nRBC: 0 % (ref 0.0–0.2)

## 2024-03-06 LAB — CMP (CANCER CENTER ONLY)
ALT: 25 U/L (ref 0–44)
AST: 34 U/L (ref 15–41)
Albumin: 4 g/dL (ref 3.5–5.0)
Alkaline Phosphatase: 69 U/L (ref 38–126)
Anion gap: 3 — ABNORMAL LOW (ref 5–15)
BUN: 17 mg/dL (ref 8–23)
CO2: 30 mmol/L (ref 22–32)
Calcium: 9.4 mg/dL (ref 8.9–10.3)
Chloride: 102 mmol/L (ref 98–111)
Creatinine: 0.86 mg/dL (ref 0.61–1.24)
GFR, Estimated: >60 mL/min (ref >60)
Glucose, Bld: 240 mg/dL — ABNORMAL HIGH (ref 70–99)
Potassium: 4.1 mmol/L (ref 3.5–5.1)
Sodium: 135 mmol/L (ref 135–145)
Total Bilirubin: 0.7 mg/dL (ref 0.0–1.2)
Total Protein: 7.7 g/dL (ref 6.5–8.1)

## 2024-03-09 LAB — AFP TUMOR MARKER: AFP, Serum, Tumor Marker: 2.7 ng/mL (ref 0.0–8.4)

## 2024-03-14 ENCOUNTER — Ambulatory Visit (HOSPITAL_COMMUNITY)
Admission: RE | Admit: 2024-03-14 | Discharge: 2024-03-14 | Disposition: A | Discharge status: Home / Self Care | Source: Ambulatory Visit | Attending: Nurse Practitioner | Admitting: Nurse Practitioner

## 2024-03-14 DIAGNOSIS — C220 Liver cell carcinoma: Secondary | ICD-10-CM | POA: Diagnosis present

## 2024-03-14 MED ORDER — GADOBUTROL 1 MMOL/ML IV SOLN
6.0000 mL | Freq: Once | INTRAVENOUS | Status: AC | PRN
  Administered 2024-03-14: 6 mL via INTRAVENOUS

## 2024-03-14 MED ORDER — SODIUM CHLORIDE (PF) 0.9 % IJ SOLN
INTRAMUSCULAR | Status: AC
  Filled 2024-03-14: qty 50

## 2024-03-14 MED ORDER — IOHEXOL 300 MG/ML  SOLN
100.0000 mL | Freq: Once | INTRAMUSCULAR | Status: AC | PRN
  Administered 2024-03-14: 100 mL via INTRAVENOUS

## 2024-03-21 ENCOUNTER — Telehealth: Payer: Self-pay | Admitting: Nurse Practitioner

## 2024-03-28 ENCOUNTER — Other Ambulatory Visit: Payer: Self-pay | Admitting: Nurse Practitioner

## 2024-03-28 DIAGNOSIS — C220 Liver cell carcinoma: Secondary | ICD-10-CM

## 2024-03-28 NOTE — Progress Notes (Signed)
 Patient Care Team: Norval Kettle, MD as PCP - General (Internal Medicine) Lanny Callander, MD as Consulting Physician (Hematology and Oncology)  Clinic Day:  03/29/2024  Referring physician: Norval Kettle, MD  ASSESSMENT & PLAN:   Assessment & Plan: Hepatocellular carcinoma (HCC) cT3N0M, stage IIIA    -diagnosed in 02/2021, liver biopsy 03/08/21 confirmed moderately differentiated hepatocellular carcinoma. No nodal or distant metastasis on CT 03/05/21.  --He is not a candidate for liver resection or transplant. Dr. Romero at [facility] did not recommend surgery given the location and extent of disease. -He started atezolizumab  and bevacizumab  on 04/28/21, held after 09/2021 due to newly diagnosed type 1 DM with hyperglycemia which was probably related to immunotherapy. --he underwent segment 8 Y90 on 07/22/21 and 06/15/2022  -he had good response to therapies  -Reviewed his restaging CT 09/15/2022 showed stable treated liver lesion, and a enlarging liver lesion in segment VIII (from 4mm to 8mm, LI-RADS category 4) -restaging MRI reviewed, which showed a stable treated liver lesion, previously LI-RADS [name] category 4 lesion in right lobe of liver is overall stable in size, no definitive washout and enhancing capsule, it is considered LI RADS 3 now.  No additional new lesion. -Patient has developed significant intermittent pain in the right upper quadrant of abdomen, exam showed enlarged left lobe of liver, mild tenderness. -Restaging liver MRI on March 30, 2023 showed stable disease, no new lesions. -His tumor marker AFP remains to be negative (initially at 1479) -Repeated MRI from October 16, 2023 showed stable post embolization appearance of multiple liver lesions, and a slightly enlargement of in arterially hyperenhancing lesion in segment 4A measuring 1.3 cm.  He was referred back to IR and underwent Y90 on 11/22/2023 -03/16/2024 CT CAP obtained. There is post treatment appearance of multiple treated lesions.  There was no evidence of lymphadenopathy or extrahepatic metastatic disease in the chest, abdomen, or pelvis. He had liver MRI done the same day showing treated appearance of the liver dome. There were no new liver lesions and no evidence of lymphadenopathy or metastatic disease in the abdomen.    Insulin -dependent diabetes Blood sugar is 449 today.  He is currently followed by primary care for diabetic control.  Per patient and family member present with him at today's visit, but sugars have been running between 300 and 500 every day.  Recommend they make appointment with primary care to further address blood sugars.  May consider referral to endocrinology if needed.  Plan Labs reviewed.  - CBC unremarkable. -CMP unremarkable other than hyperglycemia with blood sugar of 449. - AFP tumor marker normal at 2.7. Reviewed CT CAP from 03/16/2024. There is post treatment appearance of multiple treated lesions. There was no evidence of lymphadenopathy or extrahepatic metastatic disease in the chest, abdomen, or pelvis.  Reviewed MRI of the abdomen done 03/16/2024 showing treated appearance of the liver dome. There were no new liver lesions and no evidence of lymphadenopathy or metastatic disease in the abdomen. Labs and follow-up in 2 months as scheduled.   The patient understands the plans discussed today and is in agreement with them.  He knows to contact our office if he develops concerns prior to his next appointment.  I provided 25 minutes of face-to-face time during this encounter and > 50% was spent counseling as documented under my assessment and plan.    Powell FORBES Lessen, NP  [facility] - A DEPT OF [facility] [address] [place] [address] Dept: [phone] Dept Fax: [phone]   No orders of the defined types were placed in this encounter.     CHIEF COMPLAINT:  CC: hepatocellular carcinoma   Current  Treatment:  cancer surveillance   INTERVAL HISTORY:  Mae is here today for repeat clinical assessment. He was last seen by myself on 03/06/2024.  CT CAP done 03/16/2024. There is post treatment appearance of multiple treated lesions. There was no evidence of lymphadenopathy or extrahepatic metastatic disease in the chest, abdomen, or pelvis. He had liver MRI done the same day showing treated appearance of the liver dome. There were no new liver lesions and no evidence of lymphadenopathy or metastatic disease in the abdomen.  He states he feels well. He denies chest pain, chest pressure, or shortness of breath. He denies headaches or visual disturbances. He denies abdominal pain, nausea, vomiting, or changes in bowel or bladder habits.  He denies fevers or chills. He denies pain. His appetite is good. His weight has decreased 5 pounds over last month.  I have reviewed the past medical history, past surgical history, social history and family history with the patient and they are unchanged from previous note.  ALLERGIES:  has no known allergies.  MEDICATIONS:  Current Outpatient Medications  Medication Sig Dispense Refill   Ascorbic Acid  (VITAMIN C) 100 MG tablet Take 100 mg by mouth daily.     blood glucose meter kit and supplies Dispense based on patient and insurance preference. Use up to four times daily as directed. (FOR ICD-10 E10.9, E11.9). 1 each 0   insulin  glargine (LANTUS) 100 UNIT/ML injection 40 Units daily.     Insulin  Pen Needle (PEN NEEDLES 3/16) 31G X 5 MM MISC 1 each by Does not apply route with breakfast, with lunch, and with evening meal. 100 each 3   Multiple Vitamin (MULTIVITAMIN WITH MINERALS) TABS tablet Take 1 tablet by mouth daily.     ondansetron  (ZOFRAN ) 8 MG tablet Take 1 tablet (8 mg total) by mouth every 8 (eight) hours as needed for nausea or vomiting. (Patient not taking: Reported on 03/06/2024) 30 tablet 0   pantoprazole  (PROTONIX ) 40 MG tablet Take 1 tablet (40  mg total) by mouth daily. (Patient not taking: Reported on 03/06/2024) 30 tablet 1   polyethylene glycol (MIRALAX  / GLYCOLAX ) 17 g packet Take 17 g by mouth daily as needed for mild constipation. 14 each 0   prochlorperazine  (COMPAZINE ) 10 MG tablet Take 1 tablet (10 mg total) by mouth every 6 (six) hours as needed for nausea or vomiting. 30 tablet 0   senna (SENOKOT) 8.6 MG TABS tablet Take 1 tablet (8.6 mg total) by mouth 2 (two) times daily. (Patient taking differently: Take 1 tablet by mouth 2 (two) times daily as needed for moderate constipation.) 120 tablet 0   vitamin B-12 (CYANOCOBALAMIN ) 100 MCG tablet Take 100 mcg by mouth daily.     No current facility-administered medications for this visit.   Facility-Administered Medications Ordered in Other Visits  Medication Dose Route Frequency Provider Last Rate Last Admin   0.9 %  sodium chloride  infusion   Intravenous Continuous Lanny Callander, MD        HISTORY OF PRESENT ILLNESS:   Oncology History Overview Note  Cancer Staging Hepatocellular carcinoma [facility]) Staging form: Liver, AJCC 8th Edition - Clinical stage from 03/08/2021: Stage IIIA (cT3, cN0, cM0) - Signed by Lanny Callander, MD on 03/11/2021 Stage prefix: Initial diagnosis Histologic grade (G): G2 Histologic grading  system: 4 grade system     Hepatocellular carcinoma (HCC)   Initial Diagnosis   Liver mass, right lobe   03/05/2021 Imaging   CT CAP  IMPRESSION: 1. Right hepatic lobe masses which are suspicious for either metastatic disease or primary malignancy such as hepatocellular carcinoma. These are suboptimally evaluated on this noncontrast exam. Consider multidisciplinary oncology consultation with potential clinical strategies of tissue sampling versus PET to direct sampling. 2. No primary malignancy identified within the chest, abdomen, or pelvis, given limitation of noncontrast exam. 3. Left nephrolithiasis. 4. Coronary artery atherosclerosis. Aortic  Atherosclerosis (ICD10-I70.0). 5. Esophageal air fluid level suggests dysmotility or gastroesophageal reflux.   03/06/2021 Imaging   MRI Liver  IMPRESSION: 9.5 cm heterogeneous hypovascular mass in anterior right hepatic lobe, and 1.3 cm hypervascular mass in the posterior right hepatic lobe. Differential diagnosis includes liver metastases and multifocal hepatocellular carcinoma. Suggest correlation with serum AFP level, and possible tissue sampling.   No evidence of abdominal metastatic disease.   03/08/2021 Initial Biopsy   A. LIVER, RIGHT, BIOPSY:  - Hepatocellular carcinoma.   COMMENT:  The hepatocellular carcinoma is moderately differentiated and associated with areas of necrosis.  Immunohistochemistry shows positivity with glypican-3 and weak staining with HepPar 1.  The tumor is negative with arginase 1, alpha-fetoprotein, CDX2, cytokeratin 7, cytokeratin 20, cytokeratin AE1/AE3, PSA, prostein and MOC31.  Polyclonal CEA shows focal canalicular pattern.  The morphology and immunophenotype are consistent with hepatocellular carcinoma.    03/08/2021 Cancer Staging   Staging form: Liver, AJCC 8th Edition - Clinical stage from 03/08/2021: Stage IIIA (cT3, cN0, cM0) - Signed by Lanny Callander, MD on 03/11/2021 Stage prefix: Initial diagnosis Histologic grade (G): G2 Histologic grading system: 4 grade system   04/28/2021 - 10/13/2021 Chemotherapy   Patient is on Treatment Plan : LUNG Atezolizumab  + Bevacizumab  q21d Maintenance     06/16/2021 Imaging   CT Abdomen  IMPRESSION: Marked interval decrease in size of the large RIGHT hepatic lobe lesion, biopsy-proven hepatocellular carcinoma. No arterial phase enhancement on early phase, vague enhancement on later phase greater than 20 Hounsfield unit enhancement on venous phase. Less pronounced arterial enhancement than on previous imaging with small peripheral foci of enhancement on arterial phase particularly along the margin of the tumor  near the dome of the liver (image 12/2) 11 mm focus of enhancement in this area.   Interval response to therapy of a small lesion previously compatible with LI-RADS category 5 lesion in the posterior RIGHT hepatic lobe.   No new hepatic lesion.   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   Signs of nephrolithiasis calculus on the LEFT measuring approximately 7 mm.   Colonic diverticulosis without evidence of acute diverticulitis.   Aortic Atherosclerosis (ICD10-I70.0).   11/02/2021 Imaging   EXAM: CT ABDOMEN AND PELVIS WITH CONTRAST   IMPRESSION: 1. Right hepatic mass has minimally decreased in size. No new focal hepatic masses are seen. 2. New small amount of ascites in the right upper quadrant. 3. Wall thickening of the cecum and ascending colon versus normal under distension. Correlate for nonspecific colitis. 4. Nonobstructing left renal calculus. 5.  Aortic Atherosclerosis (ICD10-I70.0).   09/15/2022 Imaging   IMPRESSION: 1. No significant change in post treatment appearance of hepatic segment VIII, with a well circumscribed, intrinsically T1 hyperintense lesion of the liver dome as well as additional heterogeneous, intrinsic T1 hyperintensity just superiorly. LI-RADS TR, nonviable. 2. Following radioembolization, new background parenchymal hyperenhancement of hepatic segment VII, with faint rim enhancing,  although internally nonenhancing lesions in the posterior liver dome corresponding to previously arterially hyperenhancing liver lesions. This is consistent with post treatment hyperemia without evidence of residual viable tumor. LI-RADS TR, nonviable. 3. Interval enlargement of an arterially hyperenhancing lesion in the anterior right lobe of the liver, near the margin of hepatic segment VIII, measuring 0.8 x 0.8 cm, previously no greater than 0.4 cm. This does not demonstrate clear evidence of washout or capsular enhancement. LI-RADS category  4 due to threshold growth.      Imaging     09/15/2022 Imaging    IMPRESSION: 1. No significant change in post treatment appearance of hepatic segment VIII, with a well circumscribed, intrinsically T1 hyperintense lesion of the liver dome as well as additional heterogeneous, intrinsic T1 hyperintensity just superiorly. LI-RADS TR, nonviable. 2. Following radioembolization, new background parenchymal hyperenhancement of hepatic segment VII, with faint rim enhancing, although internally nonenhancing lesions in the posterior liver dome corresponding to previously arterially hyperenhancing liver lesions. This is consistent with post treatment hyperemia without evidence of residual viable tumor. LI-RADS TR, nonviable. 3. Interval enlargement of an arterially hyperenhancing lesion in the anterior right lobe of the liver, near the margin of hepatic segment VIII, measuring 0.8 x 0.8 cm, previously no greater than 0.4 cm. This does not demonstrate clear evidence of washout or capsular enhancement. LI-RADS category 4 due to threshold growth.     12/18/2022 Imaging    IMPRESSION: 1. Stable appearance of the previously treated segment VIII lesion. LI-RADS TR, nonviable. 2. Stable appearance of the index lesions in the posterior right hepatic dome. As before, there is early heterogeneous but relatively diffuse enhancement in the posterior right hepatic dome, likely treatment related. Index lesions documented previously are similar in size and enhancement characteristics without suspicious enhancement characteristics. LI-RADS TR, nonviable. 3. Small lesion showing arterial phase hyperenhancement in the anterior right liver (segment II right) persists. As before there is no definite washout and no enhancing capsule associated with this lesion. Given lack of interval growth, this lesion is downgraded to LI-RADS 3. Close continued follow-up recommended. 4. No new or progressive findings in the  abdomen.     03/16/2024 Imaging   MR abdomen with and without contrast  IMPRESSION: 1. Redemonstrated treated appearance of the liver dome, involving segments IVA, VII, and VIII with overlying capsular retraction. A previously seen new arterially hyperenhancing lesion in hepatic segment IVA is no longer internally enhancing, and previously treated lesions in hepatic segment VII and VIII are unchanged in appearance, likewise without residual enhancement. LI-RADS TR, nonviable. 2. No new liver lesions. 3. No evidence of lymphadenopathy or metastatic disease in the abdomen.      03/16/2024 Imaging   CT chest, abdomen, and pelvis with contrast  IMPRESSION: 1. Post treatment appearance of the liver dome, with scattered, heterogeneous areas of arterial enhancement and multiple treated lesions. Please see separately reported same day MR examination of the abdomen for detailed comparison to prior MR and LI-RADS characterization of treated liver lesions. 2. No evidence of lymphadenopathy or extrahepatic metastatic disease in the chest, abdomen, or pelvis. 3. Prostatomegaly. 4. Colonic diverticulosis without evidence of acute diverticulitis.   Aortic Atherosclerosis (ICD10-I70.0).       REVIEW OF SYSTEMS:   Constitutional: Denies fevers or chills. Has had a 5 pound weight loss despite good appetite.  Eyes: Denies blurriness of vision Ears, nose, mouth, throat, and face: Denies mucositis or sore throat Respiratory: Denies cough, dyspnea or wheezes Cardiovascular: Denies palpitation, chest discomfort or  lower extremity swelling Gastrointestinal:  Denies nausea, heartburn or change in bowel habits Skin: Denies abnormal skin rashes Lymphatics: Denies new lymphadenopathy or easy bruising Neurological:Denies numbness, tingling or new weaknesses Behavioral/Psych: Mood is stable, no new changes  All other systems were reviewed with the patient and are negative.   VITALS:   Today's Vitals    03/29/24 1359 03/29/24 1401  BP: 130/80   Pulse: 72   Resp: 17   Temp: 98.2 F (36.8 C)   TempSrc: Temporal   SpO2: 99%   Weight: 126 lb 12.8 oz (57.5 kg)   Height: 5' 5 (1.651 m)   PainSc:  0-No pain   Body mass index is 21.1 kg/m.   Wt Readings from Last 3 Encounters:  03/29/24 126 lb 12.8 oz (57.5 kg)  03/06/24 131 lb 14.4 oz (59.8 kg)  11/27/23 123 lb 11.2 oz (56.1 kg)    Body mass index is 21.1 kg/m.  Performance status (ECOG): 1 - Symptomatic but completely ambulatory  PHYSICAL EXAM:   GENERAL:alert, no distress and comfortable SKIN: skin color, texture, turgor are normal, no rashes or significant lesions EYES: normal, Conjunctiva are pink and non-injected, sclera clear OROPHARYNX:no exudate, no erythema and lips, buccal mucosa, and tongue normal  NECK: supple, thyroid normal size, non-tender, without nodularity LYMPH:  no palpable lymphadenopathy in the cervical, axillary or inguinal LUNGS: clear to auscultation and percussion with normal breathing effort HEART: regular rate & rhythm and no murmurs and no lower extremity edema ABDOMEN:abdomen soft, non-tender and normal bowel sounds Musculoskeletal:no cyanosis of digits and no clubbing  NEURO: alert & oriented x 3 with fluent speech, no focal motor/sensory deficits  LABORATORY DATA:  I have reviewed the data as listed    Component Value Date/Time   NA 132 (L) 03/29/2024 1320   K 5.1 03/29/2024 1320   CL 98 03/29/2024 1320   CO2 30 03/29/2024 1320   GLUCOSE 449 (H) 03/29/2024 1320   BUN 19 03/29/2024 1320   CREATININE 0.90 03/29/2024 1320   CALCIUM 9.9 03/29/2024 1320   PROT 7.8 03/29/2024 1320   ALBUMIN  4.0 03/29/2024 1320   AST 30 03/29/2024 1320   ALT 26 03/29/2024 1320   ALKPHOS 62 03/29/2024 1320   BILITOT 0.8 03/29/2024 1320   GFRNONAA >60 03/29/2024 1320   GFRAA >60 02/08/2016 1435     Lab Results  Component Value Date   WBC 3.6 (L) 03/29/2024   NEUTROABS 2.6 03/29/2024   HGB 13.2  03/29/2024   HCT 38.0 (L) 03/29/2024   MCV 86.6 03/29/2024   PLT 105 (L) 03/29/2024    RADIOGRAPHIC STUDIES: MR Abdomen W Wo Contrast Result Date: 03/16/2024 CLINICAL DATA:  Recurrent hepatocellular carcinoma, status post Y 90 embolization 11/22/2023 EXAM: MRI ABDOMEN WITHOUT AND WITH CONTRAST TECHNIQUE: Multiplanar multisequence MR imaging of the abdomen was performed both before and after the administration of intravenous contrast. CONTRAST:  6mL GADAVIST  GADOBUTROL  1 MMOL/ML IV SOLN COMPARISON:  MR abdomen, 09/19/2023 FINDINGS: Lower chest: No acute abnormality. Hepatobiliary: Redemonstrated treated appearance of the liver dome, involving segments IVA, VII, and VIII with overlying capsular retraction. A previously seen new arterially hyperenhancing lesion in hepatic segment IVA is no longer internally enhancing (series 12, image 18), and previously treated lesions in hepatic segment VII and VIII are unchanged in appearance, likewise without residual enhancement. Hemorrhagic or proteinaceous treated lesion in hepatic segment VIII (series 10, image 21). There is ill-defined irregular and rim like post treatment hyperemia throughout the liver dome (series 12, image  18). No gallstones, gallbladder wall thickening, or biliary dilatation. Pancreas: Unremarkable. No pancreatic ductal dilatation or surrounding inflammatory changes. Spleen: Normal in size without significant abnormality. Adrenals/Urinary Tract: Adrenal glands are unremarkable. Kidneys are normal, without renal calculi, solid lesion, or hydronephrosis. Stomach/Bowel: Stomach is within normal limits. No evidence of bowel wall thickening, distention, or inflammatory changes. Vascular/Lymphatic: No significant vascular findings are present. No enlarged abdominal lymph nodes. Other: No abdominal wall hernia or abnormality. No ascites. Musculoskeletal: No acute or significant osseous findings. IMPRESSION: 1. Redemonstrated treated appearance of the  liver dome, involving segments IVA, VII, and VIII with overlying capsular retraction. A previously seen new arterially hyperenhancing lesion in hepatic segment IVA is no longer internally enhancing, and previously treated lesions in hepatic segment VII and VIII are unchanged in appearance, likewise without residual enhancement. LI-RADS TR, nonviable. 2. No new liver lesions. 3. No evidence of lymphadenopathy or metastatic disease in the abdomen. Electronically Signed   By: Marolyn JONETTA Jaksch M.D.   On: 03/16/2024 12:11   CT CHEST ABDOMEN PELVIS W CONTRAST Result Date: 03/16/2024 CLINICAL DATA:  Hepatocellular carcinoma, assess treatment response * Tracking Code: BO * EXAM: CT CHEST, ABDOMEN, AND PELVIS WITH CONTRAST TECHNIQUE: Multidetector CT imaging of the chest, abdomen and pelvis was performed following the standard protocol during bolus administration of intravenous contrast. RADIATION DOSE REDUCTION: This exam was performed according to the departmental dose-optimization program which includes automated exposure control, adjustment of the mA and/or kV according to patient size and/or use of iterative reconstruction technique. CONTRAST:  OMNIPAQUE  IOHEXOL  300 MG/ML  SOLN COMPARISON:  MR abdomen, 09/19/2023, CT abdomen pelvis, 01/28/2022 FINDINGS: CT CHEST FINDINGS Cardiovascular: Aortic atherosclerosis. Normal heart size. No pericardial effusion. Mediastinum/Nodes: No enlarged mediastinal, hilar, or axillary lymph nodes. Thyroid gland, trachea, and esophagus demonstrate no significant findings. Lungs/Pleura: Occasional tiny calcified pulmonary nodules, benign, requiring no specific further follow-up or characterization. No pleural effusion or pneumothorax. Musculoskeletal: No chest wall abnormality. No acute osseous findings. CT ABDOMEN PELVIS FINDINGS Hepatobiliary: Post treatment appearance of the liver dome, with scattered, heterogeneous areas of arterial enhancement and multiple treated lesions (series  5, image 17). Mildly coarse contour of the liver. No gallstones, gallbladder wall thickening, or biliary dilatation. Pancreas: Unremarkable. No pancreatic ductal dilatation or surrounding inflammatory changes. Spleen: Normal in size without significant abnormality. Adrenals/Urinary Tract: Adrenal glands are unremarkable. Kidneys are normal, without renal calculi, solid lesion, or hydronephrosis. Bladder is unremarkable. Stomach/Bowel: Stomach is within normal limits. Appendix appears normal. No evidence of bowel wall thickening, distention, or inflammatory changes. Colonic diverticulosis. Vascular/Lymphatic: Aortic atherosclerosis. No enlarged abdominal or pelvic lymph nodes. Reproductive: Prostatomegaly. Other: No abdominal wall hernia or abnormality. No ascites. Musculoskeletal: No acute osseous findings. IMPRESSION: 1. Post treatment appearance of the liver dome, with scattered, heterogeneous areas of arterial enhancement and multiple treated lesions. Please see separately reported same day MR examination of the abdomen for detailed comparison to prior MR and LI-RADS characterization of treated liver lesions. 2. No evidence of lymphadenopathy or extrahepatic metastatic disease in the chest, abdomen, or pelvis. 3. Prostatomegaly. 4. Colonic diverticulosis without evidence of acute diverticulitis. Aortic Atherosclerosis (ICD10-I70.0). Electronically Signed   By: Marolyn JONETTA Jaksch M.D.   On: 03/16/2024 12:03

## 2024-03-28 NOTE — Assessment & Plan Note (Addendum)
 cT3N0M, stage IIIA    -diagnosed in 02/2021, liver biopsy 03/08/21 confirmed moderately differentiated hepatocellular carcinoma. No nodal or distant metastasis on CT 03/05/21.  --He is not a candidate for liver resection or transplant. Dr. Romero at [facility] did not recommend surgery given the location and extent of disease. -He started atezolizumab  and bevacizumab  on 04/28/21, held after 09/2021 due to newly diagnosed type 1 DM with hyperglycemia which was probably related to immunotherapy. --he underwent segment 8 Y90 on 07/22/21 and 06/15/2022  -he had good response to therapies  -Reviewed his restaging CT 09/15/2022 showed stable treated liver lesion, and a enlarging liver lesion in segment VIII (from 4mm to 8mm, LI-RADS category 4) -restaging MRI reviewed, which showed a stable treated liver lesion, previously LI-RADS [name] category 4 lesion in right lobe of liver is overall stable in size, no definitive washout and enhancing capsule, it is considered LI RADS 3 now.  No additional new lesion. -Patient has developed significant intermittent pain in the right upper quadrant of abdomen, exam showed enlarged left lobe of liver, mild tenderness. -Restaging liver MRI on March 30, 2023 showed stable disease, no new lesions. -His tumor marker AFP remains to be negative (initially at 1479) -Repeated MRI from October 16, 2023 showed stable post embolization appearance of multiple liver lesions, and a slightly enlargement of in arterially hyperenhancing lesion in segment 4A measuring 1.3 cm.  He was referred back to IR and underwent Y90 on 11/22/2023 -03/16/2024 CT CAP obtained. There is post treatment appearance of multiple treated lesions. There was no evidence of lymphadenopathy or extrahepatic metastatic disease in the chest, abdomen, or pelvis. He had liver MRI done the same day showing treated appearance of the liver dome. There were no new liver lesions and no evidence of lymphadenopathy or metastatic disease in  the abdomen.

## 2024-03-29 ENCOUNTER — Inpatient Hospital Stay: Attending: Hematology

## 2024-03-29 ENCOUNTER — Inpatient Hospital Stay (HOSPITAL_BASED_OUTPATIENT_CLINIC_OR_DEPARTMENT_OTHER): Admitting: Nurse Practitioner

## 2024-03-29 VITALS — BP 130/80 | HR 72 | Temp 98.2°F | Resp 17 | Ht 65.0 in | Wt 126.8 lb

## 2024-03-29 DIAGNOSIS — Z8505 Personal history of malignant neoplasm of liver: Secondary | ICD-10-CM | POA: Insufficient documentation

## 2024-03-29 DIAGNOSIS — E119 Type 2 diabetes mellitus without complications: Secondary | ICD-10-CM | POA: Insufficient documentation

## 2024-03-29 DIAGNOSIS — C220 Liver cell carcinoma: Secondary | ICD-10-CM

## 2024-03-29 LAB — CMP (CANCER CENTER ONLY)
ALT: 26 U/L (ref 0–44)
AST: 30 U/L (ref 15–41)
Albumin: 4 g/dL (ref 3.5–5.0)
Alkaline Phosphatase: 62 U/L (ref 38–126)
Anion gap: 4 — ABNORMAL LOW (ref 5–15)
BUN: 19 mg/dL (ref 8–23)
CO2: 30 mmol/L (ref 22–32)
Calcium: 9.9 mg/dL (ref 8.9–10.3)
Chloride: 98 mmol/L (ref 98–111)
Creatinine: 0.9 mg/dL (ref 0.61–1.24)
GFR, Estimated: >60 mL/min (ref >60)
Glucose, Bld: 449 mg/dL — ABNORMAL HIGH (ref 70–99)
Potassium: 5.1 mmol/L (ref 3.5–5.1)
Sodium: 132 mmol/L — ABNORMAL LOW (ref 135–145)
Total Bilirubin: 0.8 mg/dL (ref 0.0–1.2)
Total Protein: 7.8 g/dL (ref 6.5–8.1)

## 2024-03-29 LAB — CBC WITH DIFFERENTIAL (CANCER CENTER ONLY)
Abs Immature Granulocytes: 0.01 10*3/uL (ref 0.00–0.07)
Basophils Absolute: 0 10*3/uL (ref 0.0–0.1)
Basophils Relative: 0 %
Eosinophils Absolute: 0 10*3/uL (ref 0.0–0.5)
Eosinophils Relative: 1 %
HCT: 38 % — ABNORMAL LOW (ref 39.0–52.0)
Hemoglobin: 13.2 g/dL (ref 13.0–17.0)
Immature Granulocytes: 0 %
Lymphocytes Relative: 15 %
Lymphs Abs: 0.5 10*3/uL — ABNORMAL LOW (ref 0.7–4.0)
MCH: 30.1 pg (ref 26.0–34.0)
MCHC: 34.7 g/dL (ref 30.0–36.0)
MCV: 86.6 fL (ref 80.0–100.0)
Monocytes Absolute: 0.4 10*3/uL (ref 0.1–1.0)
Monocytes Relative: 12 %
Neutro Abs: 2.6 10*3/uL (ref 1.7–7.7)
Neutrophils Relative %: 72 %
Platelet Count: 105 10*3/uL — ABNORMAL LOW (ref 150–400)
RBC: 4.39 MIL/uL (ref 4.22–5.81)
RDW: 12.4 % (ref 11.5–15.5)
WBC Count: 3.6 10*3/uL — ABNORMAL LOW (ref 4.0–10.5)
nRBC: 0 % (ref 0.0–0.2)

## 2024-03-30 LAB — AFP TUMOR MARKER: AFP, Serum, Tumor Marker: 2.7 ng/mL (ref 0.0–8.4)

## 2024-04-04 ENCOUNTER — Encounter (HOSPITAL_COMMUNITY): Payer: Self-pay | Admitting: Nurse Practitioner

## 2024-04-10 ENCOUNTER — Encounter: Payer: Self-pay | Admitting: Nurse Practitioner

## 2024-05-02 ENCOUNTER — Encounter: Payer: Self-pay | Admitting: Interventional Radiology

## 2024-06-13 ENCOUNTER — Inpatient Hospital Stay (HOSPITAL_BASED_OUTPATIENT_CLINIC_OR_DEPARTMENT_OTHER): Admitting: Hematology

## 2024-06-13 ENCOUNTER — Encounter: Payer: Self-pay | Admitting: Hematology

## 2024-06-13 ENCOUNTER — Inpatient Hospital Stay: Attending: Hematology

## 2024-06-13 VITALS — BP 148/70 | HR 75 | Temp 98.2°F | Resp 17 | Ht 65.0 in | Wt 134.7 lb

## 2024-06-13 DIAGNOSIS — D72819 Decreased white blood cell count, unspecified: Secondary | ICD-10-CM | POA: Diagnosis not present

## 2024-06-13 DIAGNOSIS — C220 Liver cell carcinoma: Secondary | ICD-10-CM

## 2024-06-13 DIAGNOSIS — Z9221 Personal history of antineoplastic chemotherapy: Secondary | ICD-10-CM | POA: Insufficient documentation

## 2024-06-13 DIAGNOSIS — E1065 Type 1 diabetes mellitus with hyperglycemia: Secondary | ICD-10-CM | POA: Diagnosis not present

## 2024-06-13 DIAGNOSIS — D6959 Other secondary thrombocytopenia: Secondary | ICD-10-CM | POA: Diagnosis present

## 2024-06-13 DIAGNOSIS — Z794 Long term (current) use of insulin: Secondary | ICD-10-CM | POA: Diagnosis not present

## 2024-06-13 DIAGNOSIS — Z8505 Personal history of malignant neoplasm of liver: Secondary | ICD-10-CM | POA: Diagnosis not present

## 2024-06-13 LAB — CBC WITH DIFFERENTIAL (CANCER CENTER ONLY)
Abs Immature Granulocytes: 0.01 K/uL (ref 0.00–0.07)
Basophils Absolute: 0 K/uL (ref 0.0–0.1)
Basophils Relative: 1 %
Eosinophils Absolute: 0.1 K/uL (ref 0.0–0.5)
Eosinophils Relative: 4 %
HCT: 36.6 % — ABNORMAL LOW (ref 39.0–52.0)
Hemoglobin: 12.4 g/dL — ABNORMAL LOW (ref 13.0–17.0)
Immature Granulocytes: 0 %
Lymphocytes Relative: 17 %
Lymphs Abs: 0.6 K/uL — ABNORMAL LOW (ref 0.7–4.0)
MCH: 29.7 pg (ref 26.0–34.0)
MCHC: 33.9 g/dL (ref 30.0–36.0)
MCV: 87.6 fL (ref 80.0–100.0)
Monocytes Absolute: 0.5 K/uL (ref 0.1–1.0)
Monocytes Relative: 14 %
Neutro Abs: 2.2 K/uL (ref 1.7–7.7)
Neutrophils Relative %: 64 %
Platelet Count: 93 K/uL — ABNORMAL LOW (ref 150–400)
RBC: 4.18 MIL/uL — ABNORMAL LOW (ref 4.22–5.81)
RDW: 12.3 % (ref 11.5–15.5)
WBC Count: 3.5 K/uL — ABNORMAL LOW (ref 4.0–10.5)
nRBC: 0 % (ref 0.0–0.2)

## 2024-06-13 LAB — CMP (CANCER CENTER ONLY)
ALT: 26 U/L (ref 0–44)
AST: 34 U/L (ref 15–41)
Albumin: 3.7 g/dL (ref 3.5–5.0)
Alkaline Phosphatase: 60 U/L (ref 38–126)
Anion gap: 3 — ABNORMAL LOW (ref 5–15)
BUN: 13 mg/dL (ref 8–23)
CO2: 31 mmol/L (ref 22–32)
Calcium: 9.4 mg/dL (ref 8.9–10.3)
Chloride: 100 mmol/L (ref 98–111)
Creatinine: 0.91 mg/dL (ref 0.61–1.24)
GFR, Estimated: >60 mL/min (ref >60)
Glucose, Bld: 366 mg/dL — ABNORMAL HIGH (ref 70–99)
Potassium: 4.3 mmol/L (ref 3.5–5.1)
Sodium: 134 mmol/L — ABNORMAL LOW (ref 135–145)
Total Bilirubin: 0.5 mg/dL (ref 0.0–1.2)
Total Protein: 7.2 g/dL (ref 6.5–8.1)

## 2024-06-13 NOTE — Progress Notes (Signed)
 [name]   [phone] [phone]   Clinic Follow up Note   Patient Care Team: Norval Kettle, MD as PCP - General (Internal Medicine) Lanny Callander, MD as Consulting Physician (Hematology and Oncology)  Date of Service:  06/13/2024  CHIEF COMPLAINT: f/u of [name]  CURRENT THERAPY:  Cancer surveillance  Oncology History   Hepatocellular carcinoma (HCC) cT3N0M, stage IIIA    -diagnosed in 02/2021, liver biopsy 03/08/21 confirmed moderately differentiated hepatocellular carcinoma. No nodal or distant metastasis on CT 03/05/21.  --He is not a candidate for liver resection or transplant. Dr. Romero at [facility] did not recommend surgery given the location and extent of disease. -He started atezolizumab  and bevacizumab  on 04/28/21, held after 09/2021 due to newly diagnosed type 1 DM with hyperglycemia which was probably related to immunotherapy. --he underwent segment 8 Y90 on 07/22/21 and 06/15/2022  -he had good response to therapies  -Reviewed his restaging CT 09/15/2022 showed stable treated liver lesion, and a enlarging liver lesion in segment VIII (from 4mm to 8mm, LI-RADS category 4) -I personally reviewed his restaging MRI from last week, which showed a stable treated liver lesion, previously LI-RADS [name] category 4 lesion in right lobe of liver is overall stable in size, no definitive washout and enhancing capsule, it is considered LI RADS 3 now.  No additional new lesion. -Patient has developed significant intermittent pain in the right upper quadrant of abdomen, exam showed enlarged left lobe of liver, mild tenderness. -Restaging liver MRI on March 30, 2023 showed stable disease, no new lesions. -His tumor marker AFP remains to be negative (initially at 1479) -Repeated MRI from October 16, 2023 showed stable post embolization appearance of multiple liver lesions, and a slightly enlargement of in arterially hyperenhancing lesion in segment 4A measuring 1.3 cm.   He was referred back to IR and underwent Y90 on 11/22/2023  Assessment & Plan Hepatocellular carcinoma post-ablation under surveillance Hepatocellular carcinoma post-ablation with no new lesions or growth on imaging. The previously treated lesion shows no enhancement, indicating no active cancer. Continued surveillance is necessary to monitor for changes or recurrence. 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 Blood sugar levels are high, with a postprandial glucose level of 366 mg/dL. Insulin  regimen includes 24-hour insulin  in the morning and additional units as needed for high readings. His sugars are improving with regular monitoring. 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 White blood cell count is slightly low, and platelet count is low. Hemoglobin is close to normal. - Monitor complete blood count regularly  Plan - He is clinically doing well, lab reviewed. - Continue cancer surveillance.  Follow-up in 2 months with lab and abdominal MRI 1 week prior   SUMMARY OF ONCOLOGIC HISTORY: Oncology History Overview Note  Cancer Staging Hepatocellular carcinoma [facility]) Staging form: Liver, AJCC 8th Edition - Clinical stage from 03/08/2021: Stage IIIA (cT3, cN0, cM0) - Signed by Lanny Callander, MD on 03/11/2021 Stage prefix: Initial diagnosis Histologic grade (G): G2 Histologic grading system: 4 grade system     Hepatocellular carcinoma (HCC)   Initial Diagnosis   Liver mass, right lobe   03/05/2021 Imaging   CT CAP  IMPRESSION: 1. Right hepatic lobe masses which are suspicious for either metastatic  disease or primary malignancy such as  hepatocellular carcinoma. These are suboptimally evaluated on this noncontrast exam. Consider multidisciplinary oncology consultation with potential clinical strategies of tissue sampling versus PET to direct sampling. 2. No primary malignancy identified within the chest, abdomen, or pelvis, given limitation of noncontrast exam. 3. Left nephrolithiasis. 4. Coronary artery atherosclerosis. Aortic Atherosclerosis (ICD10-I70.0). 5. Esophageal air fluid level suggests dysmotility or gastroesophageal reflux.   03/06/2021 Imaging   MRI Liver  IMPRESSION: 9.5 cm heterogeneous hypovascular mass in anterior right hepatic lobe, and 1.3 cm hypervascular mass in the posterior right hepatic lobe. Differential diagnosis includes liver metastases and multifocal hepatocellular carcinoma. Suggest correlation with serum AFP level, and possible tissue sampling.   No evidence of abdominal metastatic disease.   03/08/2021 Initial Biopsy   A. LIVER, RIGHT, BIOPSY:  - Hepatocellular carcinoma.   COMMENT:  The hepatocellular carcinoma is moderately differentiated and associated with areas of necrosis.  Immunohistochemistry shows positivity with glypican-3 and weak staining with HepPar 1.  The tumor is negative with arginase 1, alpha-fetoprotein, CDX2, cytokeratin 7, cytokeratin 20, cytokeratin AE1/AE3, PSA, prostein and MOC31.  Polyclonal CEA shows focal canalicular pattern.  The morphology and immunophenotype are consistent with hepatocellular carcinoma.    03/08/2021 Cancer Staging   Staging form: Liver, AJCC 8th Edition - Clinical stage from 03/08/2021: Stage IIIA (cT3, cN0, cM0) - Signed by Lanny Callander, MD on 03/11/2021 Stage prefix: Initial diagnosis Histologic grade (G): G2 Histologic grading system: 4 grade system   04/28/2021 - 10/13/2021 Chemotherapy   Patient is on Treatment Plan : LUNG Atezolizumab  + Bevacizumab  q21d Maintenance     06/16/2021 Imaging   CT Abdomen  IMPRESSION: Marked interval  decrease in size of the large RIGHT hepatic lobe lesion, biopsy-proven hepatocellular carcinoma. No arterial phase enhancement on early phase, vague enhancement on later phase greater than 20 Hounsfield unit enhancement on venous phase. Less pronounced arterial enhancement than on previous imaging with small peripheral foci of enhancement on arterial phase particularly along the margin of the tumor near the dome of the liver (image 12/2) 11 mm focus of enhancement in this area.   Interval response to therapy of a small lesion previously compatible with LI-RADS category 5 lesion in the posterior RIGHT hepatic lobe.   No new hepatic lesion.   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   Signs of nephrolithiasis calculus on the LEFT measuring approximately 7 mm.   Colonic diverticulosis without evidence of acute diverticulitis.   Aortic Atherosclerosis (ICD10-I70.0).   11/02/2021 Imaging   EXAM: CT ABDOMEN AND PELVIS WITH CONTRAST   IMPRESSION: 1. Right hepatic mass has minimally decreased in size. No new focal hepatic masses are seen. 2. New small amount of ascites in the right upper quadrant. 3. Wall thickening of the cecum and ascending colon versus normal under distension. Correlate for nonspecific colitis. 4. Nonobstructing left renal calculus. 5.  Aortic Atherosclerosis (ICD10-I70.0).   09/15/2022 Imaging   IMPRESSION: 1. No significant change in post treatment appearance of hepatic segment VIII, with a well circumscribed, intrinsically T1 hyperintense lesion of the liver dome as well as additional heterogeneous, intrinsic T1 hyperintensity just superiorly. LI-RADS TR, nonviable. 2. Following radioembolization, new background parenchymal hyperenhancement of hepatic segment VII, with faint rim enhancing, although internally nonenhancing lesions in the posterior liver dome corresponding to previously arterially hyperenhancing liver lesions. This is  consistent with post treatment hyperemia without evidence of residual viable tumor. LI-RADS TR, nonviable. 3. Interval enlargement of an arterially hyperenhancing lesion in  the anterior right lobe of the liver, near the margin of hepatic segment VIII, measuring 0.8 x 0.8 cm, previously no greater than 0.4 cm. This does not demonstrate clear evidence of washout or capsular enhancement. LI-RADS category 4 due to threshold growth.      Imaging     09/15/2022 Imaging    IMPRESSION: 1. No significant change in post treatment appearance of hepatic segment VIII, with a well circumscribed, intrinsically T1 hyperintense lesion of the liver dome as well as additional heterogeneous, intrinsic T1 hyperintensity just superiorly. LI-RADS TR, nonviable. 2. Following radioembolization, new background parenchymal hyperenhancement of hepatic segment VII, with faint rim enhancing, although internally nonenhancing lesions in the posterior liver dome corresponding to previously arterially hyperenhancing liver lesions. This is consistent with post treatment hyperemia without evidence of residual viable tumor. LI-RADS TR, nonviable. 3. Interval enlargement of an arterially hyperenhancing lesion in the anterior right lobe of the liver, near the margin of hepatic segment VIII, measuring 0.8 x 0.8 cm, previously no greater than 0.4 cm. This does not demonstrate clear evidence of washout or capsular enhancement. LI-RADS category 4 due to threshold growth.     12/18/2022 Imaging    IMPRESSION: 1. Stable appearance of the previously treated segment VIII lesion. LI-RADS TR, nonviable. 2. Stable appearance of the index lesions in the posterior right hepatic dome. As before, there is early heterogeneous but relatively diffuse enhancement in the posterior right hepatic dome, likely treatment related. Index lesions documented previously are similar in size and enhancement characteristics without  suspicious enhancement characteristics. LI-RADS TR, nonviable. 3. Small lesion showing arterial phase hyperenhancement in the anterior right liver (segment II right) persists. As before there is no definite washout and no enhancing capsule associated with this lesion. Given lack of interval growth, this lesion is downgraded to LI-RADS 3. Close continued follow-up recommended. 4. No new or progressive findings in the abdomen.     03/16/2024 Imaging   MR abdomen with and without contrast  IMPRESSION: 1. Redemonstrated treated appearance of the liver dome, involving segments IVA, VII, and VIII with overlying capsular retraction. A previously seen new arterially hyperenhancing lesion in hepatic segment IVA is no longer internally enhancing, and previously treated lesions in hepatic segment VII and VIII are unchanged in appearance, likewise without residual enhancement. LI-RADS TR, nonviable. 2. No new liver lesions. 3. No evidence of lymphadenopathy or metastatic disease in the abdomen.      03/16/2024 Imaging   CT chest, abdomen, and pelvis with contrast  IMPRESSION: 1. Post treatment appearance of the liver dome, with scattered, heterogeneous areas of arterial enhancement and multiple treated lesions. Please see separately reported same day MR examination of the abdomen for detailed comparison to prior MR and LI-RADS characterization of treated liver lesions. 2. No evidence of lymphadenopathy or extrahepatic metastatic disease in the chest, abdomen, or pelvis. 3. Prostatomegaly. 4. Colonic diverticulosis without evidence of acute diverticulitis.   Aortic Atherosclerosis (ICD10-I70.0).      Discussed the use of AI scribe software for clinical note transcription with the patient, who gave verbal consent to proceed.  History of Present Illness Shawn Bailey is a 78 year old male with hepatocellular carcinoma who presents for follow-up.  He underwent ablation treatment in May 2025  for hepatocellular carcinoma. Post-treatment, he has experienced no pain or new symptoms. Imaging in February 2025 showed a treated appearance of the lesion with no new growth or enhancement. He is here for a follow-up visit as no follow-up was scheduled post-treatment.  Recent blood work showed a slightly low white blood cell count and platelet count. Hemoglobin levels were close to normal, and kidney and liver functions were normal. The tumor marker AFP was normal in June 2025, with current results pending.  He has diabetes managed with insulin  therapy, with a 24-hour insulin  dose every morning and additional units as needed. Blood sugar was high at 366 mg/dL after lunch on the day of the visit.     All other systems were reviewed with the patient and are negative.  MEDICAL HISTORY:  Past Medical History:  Diagnosis Date   Depression    Diabetes mellitus without complication (HCC)    Hepatocellular carcinoma (HCC) 03/08/2021   Parasite infestation     SURGICAL HISTORY: Past Surgical History:  Procedure Laterality Date   IR 3D INDEPENDENT WKST  05/25/2022   IR 3D INDEPENDENT WKST  11/09/2023   IR ANGIOGRAM SELECTIVE EACH ADDITIONAL VESSEL  07/01/2021   IR ANGIOGRAM SELECTIVE EACH ADDITIONAL VESSEL  07/22/2021   IR ANGIOGRAM SELECTIVE EACH ADDITIONAL VESSEL  07/22/2021   IR ANGIOGRAM SELECTIVE EACH ADDITIONAL VESSEL  07/22/2021   IR ANGIOGRAM SELECTIVE EACH ADDITIONAL VESSEL  07/22/2021   IR ANGIOGRAM SELECTIVE EACH ADDITIONAL VESSEL  05/25/2022   IR ANGIOGRAM SELECTIVE EACH ADDITIONAL VESSEL  05/25/2022   IR ANGIOGRAM SELECTIVE EACH ADDITIONAL VESSEL  05/25/2022   IR ANGIOGRAM SELECTIVE EACH ADDITIONAL VESSEL  05/25/2022   IR ANGIOGRAM SELECTIVE EACH ADDITIONAL VESSEL  05/25/2022   IR ANGIOGRAM SELECTIVE EACH ADDITIONAL VESSEL  05/25/2022   IR ANGIOGRAM SELECTIVE EACH ADDITIONAL VESSEL  06/15/2022   IR ANGIOGRAM SELECTIVE EACH ADDITIONAL VESSEL  06/15/2022   IR ANGIOGRAM SELECTIVE EACH ADDITIONAL  VESSEL  06/15/2022   IR ANGIOGRAM SELECTIVE EACH ADDITIONAL VESSEL  11/09/2023   IR ANGIOGRAM SELECTIVE EACH ADDITIONAL VESSEL  11/09/2023   IR ANGIOGRAM SELECTIVE EACH ADDITIONAL VESSEL  11/22/2023   IR ANGIOGRAM SELECTIVE EACH ADDITIONAL VESSEL  11/22/2023   IR ANGIOGRAM VISCERAL SELECTIVE  07/01/2021   IR ANGIOGRAM VISCERAL SELECTIVE  07/22/2021   IR ANGIOGRAM VISCERAL SELECTIVE  05/25/2022   IR ANGIOGRAM VISCERAL SELECTIVE  06/15/2022   IR ANGIOGRAM VISCERAL SELECTIVE  11/09/2023   IR ANGIOGRAM VISCERAL SELECTIVE  11/09/2023   IR ANGIOGRAM VISCERAL SELECTIVE  11/22/2023   IR EMBO ARTERIAL NOT HEMORR HEMANG INC GUIDE ROADMAPPING  07/01/2021   IR EMBO TUMOR ORGAN ISCHEMIA INFARCT INC GUIDE ROADMAPPING  07/22/2021   IR EMBO TUMOR ORGAN ISCHEMIA INFARCT INC GUIDE ROADMAPPING  06/15/2022   IR EMBO TUMOR ORGAN ISCHEMIA INFARCT INC GUIDE ROADMAPPING  11/22/2023   IR RADIOLOGIST EVAL & MGMT  06/01/2021   IR RADIOLOGIST EVAL & MGMT  04/26/2022   IR RADIOLOGIST EVAL & MGMT  07/12/2022   IR RADIOLOGIST EVAL & MGMT  10/05/2023   IR US  GUIDE VASC ACCESS RIGHT  07/01/2021   IR US  GUIDE VASC ACCESS RIGHT  07/22/2021   IR US  GUIDE VASC ACCESS RIGHT  05/25/2022   IR US  GUIDE VASC ACCESS RIGHT  06/15/2022   IR US  GUIDE VASC ACCESS RIGHT  11/09/2023   IR US  GUIDE VASC ACCESS RIGHT  11/22/2023   NO PAST SURGERIES      I have reviewed the social history and family history with the patient and they are unchanged from previous note.  ALLERGIES:  has no known allergies.  MEDICATIONS:  Current Outpatient Medications  Medication Sig Dispense Refill   Ascorbic Acid  (VITAMIN C) 100 MG tablet Take 100 mg by mouth daily.  blood glucose meter kit and supplies Dispense based on patient and insurance preference. Use up to four times daily as directed. (FOR ICD-10 E10.9, E11.9). 1 each 0   insulin  glargine (LANTUS) 100 UNIT/ML injection 40 Units daily.     Insulin  Pen Needle (PEN NEEDLES 3/16) 31G X 5 MM MISC 1 each by Does not apply  route with breakfast, with lunch, and with evening meal. 100 each 3   Multiple Vitamin (MULTIVITAMIN WITH MINERALS) TABS tablet Take 1 tablet by mouth daily.     ondansetron  (ZOFRAN ) 8 MG tablet Take 1 tablet (8 mg total) by mouth every 8 (eight) hours as needed for nausea or vomiting. (Patient not taking: Reported on 03/06/2024) 30 tablet 0   pantoprazole  (PROTONIX ) 40 MG tablet Take 1 tablet (40 mg total) by mouth daily. (Patient not taking: Reported on 03/06/2024) 30 tablet 1   polyethylene glycol (MIRALAX  / GLYCOLAX ) 17 g packet Take 17 g by mouth daily as needed for mild constipation. 14 each 0   prochlorperazine  (COMPAZINE ) 10 MG tablet Take 1 tablet (10 mg total) by mouth every 6 (six) hours as needed for nausea or vomiting. 30 tablet 0   senna (SENOKOT) 8.6 MG TABS tablet Take 1 tablet (8.6 mg total) by mouth 2 (two) times daily. (Patient taking differently: Take 1 tablet by mouth 2 (two) times daily as needed for moderate constipation.) 120 tablet 0   vitamin B-12 (CYANOCOBALAMIN ) 100 MCG tablet Take 100 mcg by mouth daily.     No current facility-administered medications for this visit.   Facility-Administered Medications Ordered in Other Visits  Medication Dose Route Frequency Provider Last Rate Last Admin   0.9 %  sodium chloride  infusion   Intravenous Continuous Lanny Callander, MD        PHYSICAL EXAMINATION: ECOG PERFORMANCE STATUS: 1 - Symptomatic but completely ambulatory  Vitals:   06/13/24 1343 06/13/24 1344  BP: (!) 160/78 (!) 148/70  Pulse: 75   Resp: 17   Temp: 98.2 F (36.8 C)   SpO2: 99%    Wt Readings from Last 3 Encounters:  06/13/24 134 lb 11.2 oz (61.1 kg)  03/29/24 126 lb 12.8 oz (57.5 kg)  03/06/24 131 lb 14.4 oz (59.8 kg)     GENERAL:alert, no distress and comfortable SKIN: skin color, texture, turgor are normal, no rashes or significant lesions EYES: normal, Conjunctiva are pink and non-injected, sclera clear NECK: supple, thyroid normal size, non-tender,  without nodularity LYMPH:  no palpable lymphadenopathy in the cervical, axillary  LUNGS: clear to auscultation and percussion with normal breathing effort HEART: regular rate & rhythm and no murmurs and no lower extremity edema ABDOMEN:abdomen soft, non-tender and normal bowel sounds Musculoskeletal:no cyanosis of digits and no clubbing  NEURO: alert & oriented x 3 with fluent speech, no focal motor/sensory deficits  Physical Exam    LABORATORY DATA:  I have reviewed the data as listed    Latest Ref Rng & Units 06/13/2024    1:10 PM 03/29/2024    1:20 PM 03/06/2024    8:46 AM  CBC  WBC 4.0 - 10.5 K/uL 3.5  3.6  3.7   Hemoglobin 13.0 - 17.0 g/dL 87.5  86.7  86.9   Hematocrit 39.0 - 52.0 % 36.6  38.0  37.7   Platelets 150 - 400 K/uL 93  105  95         Latest Ref Rng & Units 06/13/2024    1:10 PM 03/29/2024    1:20 PM 03/06/2024  8:46 AM  CMP  Glucose 70 - 99 mg/dL 633  550  759   BUN 8 - 23 mg/dL 13  19  17    Creatinine 0.61 - 1.24 mg/dL 9.08  9.09  9.13   Sodium 135 - 145 mmol/L 134  132  135   Potassium 3.5 - 5.1 mmol/L 4.3  5.1  4.1   Chloride 98 - 111 mmol/L 100  98  102   CO2 22 - 32 mmol/L 31  30  30    Calcium 8.9 - 10.3 mg/dL 9.4  9.9  9.4   Total Protein 6.5 - 8.1 g/dL 7.2  7.8  7.7   Total Bilirubin 0.0 - 1.2 mg/dL 0.5  0.8  0.7   Alkaline Phos 38 - 126 U/L 60  62  69   AST 15 - 41 U/L 34  30  34   ALT 0 - 44 U/L 26  26  25        RADIOGRAPHIC STUDIES: I have personally reviewed the radiological images as listed and agreed with the findings in the report. No results found.    Orders Placed This Encounter  Procedures   MR Abdomen W Wo Contrast    Standing Status:   Future    Expected Date:   08/06/2024    Expiration Date:   06/13/2025    If indicated for the ordered procedure, I authorize the administration of contrast media per Radiology protocol:   Yes    What is the patient's sedation requirement?:   No Sedation    Does the patient have a pacemaker  or implanted devices?:   No    Preferred imaging location?:   [facility] (table limit - 500lbs)   All questions were answered. The patient knows to call the clinic with any problems, questions or concerns. No barriers to learning was detected. The total time spent in the appointment was 25 minutes, including review of chart and various tests results, discussions about plan of care and coordination of care plan     Onita Mattock, MD 06/13/2024

## 2024-06-13 NOTE — Assessment & Plan Note (Signed)
 cT3N0M, stage IIIA    -diagnosed in 02/2021, liver biopsy 03/08/21 confirmed moderately differentiated hepatocellular carcinoma. No nodal or distant metastasis on CT 03/05/21.  --He is not a candidate for liver resection or transplant. Dr. Romero at [facility] did not recommend surgery given the location and extent of disease. -He started atezolizumab  and bevacizumab  on 04/28/21, held after 09/2021 due to newly diagnosed type 1 DM with hyperglycemia which was probably related to immunotherapy. --he underwent segment 8 Y90 on 07/22/21 and 06/15/2022  -he had good response to therapies  -Reviewed his restaging CT 09/15/2022 showed stable treated liver lesion, and a enlarging liver lesion in segment VIII (from 4mm to 8mm, LI-RADS category 4) -I personally reviewed his restaging MRI from last week, which showed a stable treated liver lesion, previously LI-RADS [name] category 4 lesion in right lobe of liver is overall stable in size, no definitive washout and enhancing capsule, it is considered LI RADS 3 now.  No additional new lesion. -Patient has developed significant intermittent pain in the right upper quadrant of abdomen, exam showed enlarged left lobe of liver, mild tenderness. -Restaging liver MRI on March 30, 2023 showed stable disease, no new lesions. -His tumor marker AFP remains to be negative (initially at 1479) -Repeated MRI from October 16, 2023 showed stable post embolization appearance of multiple liver lesions, and a slightly enlargement of in arterially hyperenhancing lesion in segment 4A measuring 1.3 cm.  He was referred back to IR and underwent Y90 on 11/22/2023

## 2024-06-14 ENCOUNTER — Other Ambulatory Visit: Payer: Self-pay

## 2024-06-14 LAB — AFP TUMOR MARKER: AFP, Serum, Tumor Marker: 2.1 ng/mL (ref 0.0–8.4)

## 2024-07-17 ENCOUNTER — Telehealth: Payer: Self-pay | Admitting: Hematology

## 2024-07-17 NOTE — Telephone Encounter (Signed)
 Shawn Bailey has been scheduled for his lab appointment on the same day as his MRI and he is aware of both appointments.

## 2024-07-20 ENCOUNTER — Observation Stay (HOSPITAL_COMMUNITY)

## 2024-07-20 ENCOUNTER — Encounter (HOSPITAL_COMMUNITY): Payer: Self-pay | Admitting: Emergency Medicine

## 2024-07-20 ENCOUNTER — Emergency Department (HOSPITAL_COMMUNITY)

## 2024-07-20 ENCOUNTER — Observation Stay (HOSPITAL_COMMUNITY): Admission: EM | Admit: 2024-07-20 | Discharge: 2024-07-21 | Disposition: A

## 2024-07-20 ENCOUNTER — Other Ambulatory Visit: Payer: Self-pay

## 2024-07-20 DIAGNOSIS — Z79899 Other long term (current) drug therapy: Secondary | ICD-10-CM | POA: Insufficient documentation

## 2024-07-20 DIAGNOSIS — E11649 Type 2 diabetes mellitus with hypoglycemia without coma: Secondary | ICD-10-CM

## 2024-07-20 DIAGNOSIS — D6949 Other primary thrombocytopenia: Secondary | ICD-10-CM | POA: Diagnosis not present

## 2024-07-20 DIAGNOSIS — Z794 Long term (current) use of insulin: Secondary | ICD-10-CM | POA: Diagnosis not present

## 2024-07-20 DIAGNOSIS — Z7901 Long term (current) use of anticoagulants: Secondary | ICD-10-CM | POA: Diagnosis not present

## 2024-07-20 DIAGNOSIS — R569 Unspecified convulsions: Secondary | ICD-10-CM | POA: Diagnosis not present

## 2024-07-20 DIAGNOSIS — R2689 Other abnormalities of gait and mobility: Secondary | ICD-10-CM | POA: Insufficient documentation

## 2024-07-20 DIAGNOSIS — E119 Type 2 diabetes mellitus without complications: Secondary | ICD-10-CM

## 2024-07-20 DIAGNOSIS — C220 Liver cell carcinoma: Secondary | ICD-10-CM | POA: Diagnosis not present

## 2024-07-20 DIAGNOSIS — F1090 Alcohol use, unspecified, uncomplicated: Secondary | ICD-10-CM | POA: Insufficient documentation

## 2024-07-20 DIAGNOSIS — K7460 Unspecified cirrhosis of liver: Secondary | ICD-10-CM | POA: Diagnosis not present

## 2024-07-20 DIAGNOSIS — W19XXXA Unspecified fall, initial encounter: Principal | ICD-10-CM

## 2024-07-20 DIAGNOSIS — G9341 Metabolic encephalopathy: Principal | ICD-10-CM | POA: Diagnosis present

## 2024-07-20 DIAGNOSIS — S0091XA Abrasion of unspecified part of head, initial encounter: Secondary | ICD-10-CM | POA: Diagnosis present

## 2024-07-20 DIAGNOSIS — E162 Hypoglycemia, unspecified: Secondary | ICD-10-CM | POA: Insufficient documentation

## 2024-07-20 DIAGNOSIS — R55 Syncope and collapse: Secondary | ICD-10-CM

## 2024-07-20 DIAGNOSIS — R4182 Altered mental status, unspecified: Secondary | ICD-10-CM | POA: Diagnosis present

## 2024-07-20 DIAGNOSIS — Z743 Need for continuous supervision: Secondary | ICD-10-CM | POA: Diagnosis not present

## 2024-07-20 LAB — URINALYSIS, ROUTINE W REFLEX MICROSCOPIC
Bilirubin Urine: NEGATIVE
Glucose, UA: NEGATIVE mg/dL
Hgb urine dipstick: NEGATIVE
Ketones, ur: NEGATIVE mg/dL
Leukocytes,Ua: NEGATIVE
Nitrite: NEGATIVE
Protein, ur: NEGATIVE mg/dL
Specific Gravity, Urine: 1.023 (ref 1.005–1.030)
pH: 7 (ref 5.0–8.0)

## 2024-07-20 LAB — CBC WITH DIFFERENTIAL/PLATELET
Abs Immature Granulocytes: 0.05 K/uL (ref 0.00–0.07)
Basophils Absolute: 0 K/uL (ref 0.0–0.1)
Basophils Relative: 1 %
Eosinophils Absolute: 0.1 K/uL (ref 0.0–0.5)
Eosinophils Relative: 1 %
HCT: 38.4 % — ABNORMAL LOW (ref 39.0–52.0)
Hemoglobin: 12.3 g/dL — ABNORMAL LOW (ref 13.0–17.0)
Immature Granulocytes: 1 %
Lymphocytes Relative: 6 %
Lymphs Abs: 0.4 K/uL — ABNORMAL LOW (ref 0.7–4.0)
MCH: 28.9 pg (ref 26.0–34.0)
MCHC: 32 g/dL (ref 30.0–36.0)
MCV: 90.4 fL (ref 80.0–100.0)
Monocytes Absolute: 0.6 K/uL (ref 0.1–1.0)
Monocytes Relative: 9 %
Neutro Abs: 5.3 K/uL (ref 1.7–7.7)
Neutrophils Relative %: 82 %
Platelets: 100 K/uL — ABNORMAL LOW (ref 150–400)
RBC: 4.25 MIL/uL (ref 4.22–5.81)
RDW: 12.7 % (ref 11.5–15.5)
WBC: 6.5 K/uL (ref 4.0–10.5)
nRBC: 0 % (ref 0.0–0.2)

## 2024-07-20 LAB — I-STAT CHEM 8, ED
BUN: 19 mg/dL (ref 8–23)
Calcium, Ion: 1.21 mmol/L (ref 1.15–1.40)
Chloride: 100 mmol/L (ref 98–111)
Creatinine, Ser: 0.7 mg/dL (ref 0.61–1.24)
Glucose, Bld: 81 mg/dL (ref 70–99)
HCT: 37 % — ABNORMAL LOW (ref 39.0–52.0)
Hemoglobin: 12.6 g/dL — ABNORMAL LOW (ref 13.0–17.0)
Potassium: 3.8 mmol/L (ref 3.5–5.1)
Sodium: 138 mmol/L (ref 135–145)
TCO2: 27 mmol/L (ref 22–32)

## 2024-07-20 LAB — COMPREHENSIVE METABOLIC PANEL WITH GFR
ALT: 31 U/L (ref 0–44)
AST: 44 U/L — ABNORMAL HIGH (ref 15–41)
Albumin: 3.9 g/dL (ref 3.5–5.0)
Alkaline Phosphatase: 60 U/L (ref 38–126)
Anion gap: 11 (ref 5–15)
BUN: 19 mg/dL (ref 8–23)
CO2: 25 mmol/L (ref 22–32)
Calcium: 9.3 mg/dL (ref 8.9–10.3)
Chloride: 101 mmol/L (ref 98–111)
Creatinine, Ser: 0.7 mg/dL (ref 0.61–1.24)
GFR, Estimated: >60 mL/min (ref >60)
Glucose, Bld: 80 mg/dL (ref 70–99)
Potassium: 4.1 mmol/L (ref 3.5–5.1)
Sodium: 137 mmol/L (ref 135–145)
Total Bilirubin: 0.8 mg/dL (ref 0.0–1.2)
Total Protein: 7.4 g/dL (ref 6.5–8.1)

## 2024-07-20 LAB — GLUCOSE, CAPILLARY
Glucose-Capillary: 52 mg/dL — ABNORMAL LOW (ref 70–99)
Glucose-Capillary: 255 mg/dL — ABNORMAL HIGH (ref 70–99)

## 2024-07-20 LAB — PROTIME-INR
INR: 1.1 (ref 0.8–1.2)
Prothrombin Time: 15 s (ref 11.4–15.2)

## 2024-07-20 LAB — CBG MONITORING, ED
Glucose-Capillary: 51 mg/dL — ABNORMAL LOW (ref 70–99)
Glucose-Capillary: 172 mg/dL — ABNORMAL HIGH (ref 70–99)
Glucose-Capillary: 77 mg/dL (ref 70–99)

## 2024-07-20 LAB — LIPASE, BLOOD: Lipase: 11 U/L (ref 11–51)

## 2024-07-20 LAB — AMMONIA: Ammonia: 19 umol/L (ref 9–35)

## 2024-07-20 LAB — I-STAT CG4 LACTIC ACID, ED
Lactic Acid, Venous: 1.2 mmol/L (ref 0.5–1.9)
Lactic Acid, Venous: 0.9 mmol/L (ref 0.5–1.9)

## 2024-07-20 LAB — APTT: aPTT: 27 s (ref 24–36)

## 2024-07-20 LAB — CK: Total CK: 148 U/L (ref 49–397)

## 2024-07-20 MED ORDER — ONDANSETRON HCL 4 MG/2ML IJ SOLN: 4.0000 mg | Freq: Four times a day (QID) | INTRAMUSCULAR | Status: DC | PRN

## 2024-07-20 MED ORDER — INSULIN ASPART 100 UNIT/ML IJ SOLN
0.0000 [IU] | INTRAMUSCULAR | Status: DC
  Administered 2024-07-20 – 2024-07-21 (×2): 3 [IU] via SUBCUTANEOUS
  Administered 2024-07-21: 8 [IU] via SUBCUTANEOUS
  Filled 2024-07-20: qty 0.15

## 2024-07-20 MED ORDER — TETANUS-DIPHTH-ACELL PERTUSSIS 5-2-15.5 LF-MCG/0.5 IM SUSP
0.5000 mL | Freq: Once | INTRAMUSCULAR | Status: DC
  Filled 2024-07-20: qty 0.5

## 2024-07-20 MED ORDER — ACETAMINOPHEN 650 MG RE SUPP: 650.0000 mg | Freq: Four times a day (QID) | RECTAL | Status: DC | PRN

## 2024-07-20 MED ORDER — ENOXAPARIN SODIUM 40 MG/0.4ML IJ SOSY
40.0000 mg | PREFILLED_SYRINGE | INTRAMUSCULAR | Status: DC
  Administered 2024-07-20: 40 mg via SUBCUTANEOUS
  Filled 2024-07-20: qty 0.4

## 2024-07-20 MED ORDER — ACETAMINOPHEN 325 MG PO TABS
650.0000 mg | ORAL_TABLET | Freq: Four times a day (QID) | ORAL | Status: DC | PRN
  Filled 2024-07-20: qty 2

## 2024-07-20 MED ORDER — GADOBUTROL 1 MMOL/ML IV SOLN
6.0000 mL | Freq: Once | INTRAVENOUS | Status: AC | PRN
  Administered 2024-07-20: 6 mL via INTRAVENOUS

## 2024-07-20 MED ORDER — ONDANSETRON HCL 4 MG PO TABS: 4.0000 mg | ORAL_TABLET | Freq: Four times a day (QID) | ORAL | Status: DC | PRN

## 2024-07-20 MED ORDER — OXYCODONE HCL 5 MG PO TABS
5.0000 mg | ORAL_TABLET | Freq: Once | ORAL | Status: AC
  Administered 2024-07-20: 5 mg via ORAL
  Filled 2024-07-20: qty 1

## 2024-07-20 MED ORDER — IOHEXOL 300 MG/ML  SOLN
100.0000 mL | Freq: Once | INTRAMUSCULAR | Status: AC | PRN
  Administered 2024-07-20: 100 mL via INTRAVENOUS

## 2024-07-20 MED ORDER — DEXTROSE-SODIUM CHLORIDE 5-0.9 % IV SOLN: INTRAVENOUS | Status: DC

## 2024-07-20 MED ORDER — ALBUTEROL SULFATE (2.5 MG/3ML) 0.083% IN NEBU: 2.5000 mg | INHALATION_SOLUTION | RESPIRATORY_TRACT | Status: DC | PRN

## 2024-07-20 MED ORDER — DEXTROSE 5 % IV SOLN: Freq: Once | INTRAVENOUS | Status: AC

## 2024-07-20 NOTE — ED Notes (Signed)
 [facility] called.

## 2024-07-20 NOTE — H&P (Addendum)
 History and Physical    Shawn Bailey FMW:[id number] DOB: Aug 30, 1946 DOA: 07/20/2024  PCP: Norval Kettle, MD   Patient coming from: Home  I have personally briefly reviewed patient's old medical records in [facility]  Chief Complaint: Fall and altered mental status  HPI: Shawn Bailey is a 78 y.o. 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  Patient woke up this morning to give himself his insulin  when he felt very shaky.  He was making his coffee in the kitchen when he fell, hitting his head on the back of the cabinet.  He tried to stand up 2 times but could not stand up.  Unclear if the patient lost consciousness.  No bowel or bladder incontinence reported.  He called his ex-wife who is also his next-door neighbor who apparently called 911.  No recent fever, vomiting, chest pain, abdominal pain, weakness of extremities, diarrhea, dysuria, hematuria reported.  Patient lives by himself and gives insulin  to himself.  He has had fluctuating blood sugars with recent blood sugars fluctuating in the mid 300s to the 40s.  EMS noted that the patient's blood sugar was 62 for which she was given oral juice and blood sugar went to the 90s.  ED Course: Patient was awake, alert and oriented and answering questions appropriately although slow to respond.  CBG was 51.  CT of chest, abdomen and pelvis with contrast showed no acute intrathoracic, intra-abdominal, intrapelvic traumatic injury but showed findings suggestive of cirrhosis.  CT of the head, cervical and T-spine showed no acute intracranial abnormality or any displaced fractures.  X-ray of pelvis and left hand did not show any fractures.  Chest x-ray did not show any active disease.  ED provider spoke to neurology on-call/Dr. Lindzen who recommended  patient be transferred to [facility] for EEG. Hospitalist service was called to evaluate the patient.  Review of Systems: As per HPI otherwise all other systems were reviewed and are negative.   Past Medical History:  Diagnosis Date   Depression    Diabetes mellitus without complication (HCC)    Hepatocellular carcinoma (HCC) 03/08/2021   Parasite infestation     Past Surgical History:  Procedure Laterality Date   IR 3D INDEPENDENT WKST  05/25/2022   IR 3D INDEPENDENT WKST  11/09/2023   IR ANGIOGRAM SELECTIVE EACH ADDITIONAL VESSEL  07/01/2021   IR ANGIOGRAM SELECTIVE EACH ADDITIONAL VESSEL  07/22/2021   IR ANGIOGRAM SELECTIVE EACH ADDITIONAL VESSEL  07/22/2021   IR ANGIOGRAM SELECTIVE EACH ADDITIONAL VESSEL  07/22/2021   IR ANGIOGRAM SELECTIVE EACH ADDITIONAL VESSEL  07/22/2021   IR ANGIOGRAM SELECTIVE EACH ADDITIONAL VESSEL  05/25/2022   IR ANGIOGRAM SELECTIVE EACH ADDITIONAL VESSEL  05/25/2022   IR ANGIOGRAM SELECTIVE EACH ADDITIONAL VESSEL  05/25/2022   IR ANGIOGRAM SELECTIVE EACH ADDITIONAL VESSEL  05/25/2022   IR ANGIOGRAM SELECTIVE EACH ADDITIONAL VESSEL  05/25/2022   IR ANGIOGRAM SELECTIVE EACH ADDITIONAL VESSEL  05/25/2022   IR ANGIOGRAM SELECTIVE EACH ADDITIONAL VESSEL  06/15/2022   IR ANGIOGRAM SELECTIVE EACH ADDITIONAL VESSEL  06/15/2022   IR ANGIOGRAM SELECTIVE EACH ADDITIONAL VESSEL  06/15/2022   IR ANGIOGRAM SELECTIVE EACH ADDITIONAL VESSEL  11/09/2023   IR ANGIOGRAM SELECTIVE EACH ADDITIONAL VESSEL  11/09/2023   IR ANGIOGRAM SELECTIVE EACH ADDITIONAL VESSEL  11/22/2023   IR ANGIOGRAM SELECTIVE EACH ADDITIONAL VESSEL  11/22/2023   IR ANGIOGRAM  VISCERAL SELECTIVE  07/01/2021   IR ANGIOGRAM VISCERAL SELECTIVE  07/22/2021   IR ANGIOGRAM VISCERAL SELECTIVE  05/25/2022   IR ANGIOGRAM VISCERAL SELECTIVE  06/15/2022   IR ANGIOGRAM VISCERAL SELECTIVE  11/09/2023   IR ANGIOGRAM VISCERAL SELECTIVE  11/09/2023   IR ANGIOGRAM VISCERAL SELECTIVE  11/22/2023   IR EMBO ARTERIAL NOT HEMORR HEMANG INC GUIDE  ROADMAPPING  07/01/2021   IR EMBO TUMOR ORGAN ISCHEMIA INFARCT INC GUIDE ROADMAPPING  07/22/2021   IR EMBO TUMOR ORGAN ISCHEMIA INFARCT INC GUIDE ROADMAPPING  06/15/2022   IR EMBO TUMOR ORGAN ISCHEMIA INFARCT INC GUIDE ROADMAPPING  11/22/2023   IR RADIOLOGIST EVAL & MGMT  06/01/2021   IR RADIOLOGIST EVAL & MGMT  04/26/2022   IR RADIOLOGIST EVAL & MGMT  07/12/2022   IR RADIOLOGIST EVAL & MGMT  10/05/2023   IR US  GUIDE VASC ACCESS RIGHT  07/01/2021   IR US  GUIDE VASC ACCESS RIGHT  07/22/2021   IR US  GUIDE VASC ACCESS RIGHT  05/25/2022   IR US  GUIDE VASC ACCESS RIGHT  06/15/2022   IR US  GUIDE VASC ACCESS RIGHT  11/09/2023   IR US  GUIDE VASC ACCESS RIGHT  11/22/2023   NO PAST SURGERIES       reports that he has never smoked. He has never used smokeless tobacco. He reports current alcohol use. He reports that he does not currently use drugs.  No Known Allergies  Family History  Problem Relation Age of Onset   Diabetes Mother     Prior to Admission medications   Medication Sig Start Date End Date Taking? Authorizing Provider  Ascorbic Acid  (VITAMIN C) 100 MG tablet Take 100 mg by mouth daily.    [provider]  blood glucose meter kit and supplies Dispense based on patient and insurance preference. Use up to four times daily as directed. (FOR ICD-10 E10.9, E11.9). 11/03/21   Patsy Lenis, MD  insulin  glargine (LANTUS) 100 UNIT/ML injection 40 Units daily.    [provider]  Insulin  Pen Needle (PEN NEEDLES 3/16) 31G X 5 MM MISC 1 each by Does not apply route with breakfast, with lunch, and with evening meal. 11/03/21   Patsy Lenis, MD  Multiple Vitamin (MULTIVITAMIN WITH MINERALS) TABS tablet Take 1 tablet by mouth daily.    [provider]  ondansetron  (ZOFRAN ) 8 MG tablet Take 1 tablet (8 mg total) by mouth every 8 (eight) hours as needed for nausea or vomiting. Patient not taking: Reported on 03/06/2024 04/28/21   Thayil, Irene T, PA-C  pantoprazole  (PROTONIX ) 40 MG  tablet Take 1 tablet (40 mg total) by mouth daily. Patient not taking: Reported on 03/06/2024 06/28/23   Lanny Callander, MD  polyethylene glycol (MIRALAX  / GLYCOLAX ) 17 g packet Take 17 g by mouth daily as needed for mild constipation. 03/08/21   Will Almarie MATSU, MD  prochlorperazine  (COMPAZINE ) 10 MG tablet Take 1 tablet (10 mg total) by mouth every 6 (six) hours as needed for nausea or vomiting. 04/28/21   Neomi Johnston DASEN, PA-C  senna (SENOKOT) 8.6 MG TABS tablet Take 1 tablet (8.6 mg total) by mouth 2 (two) times daily. Patient taking differently: Take 1 tablet by mouth 2 (two) times daily as needed for moderate constipation. 03/08/21   Will Almarie MATSU, MD  vitamin B-12 (CYANOCOBALAMIN ) 100 MCG tablet Take 100 mcg by mouth daily.    [provider]    Physical Exam: Vitals:   07/20/24 1035 07/20/24 1245 07/20/24 1330 07/20/24 1400  BP:  ROLLEN)  156/89 135/86 (!) 152/85  Pulse:  83 86 89  Resp:  16 15 17   Temp:      TempSrc:      SpO2:  99% 98% 99%  Weight: 60.8 kg     Height: 5' 5 (1.651 m)       Constitutional: NAD, calm, comfortable.  Elderly male lying in bed. Vitals:   07/20/24 1035 07/20/24 1245 07/20/24 1330 07/20/24 1400  BP:  (!) 156/89 135/86 (!) 152/85  Pulse:  83 86 89  Resp:  16 15 17   Temp:      TempSrc:      SpO2:  99% 98% 99%  Weight: 60.8 kg     Height: 5' 5 (1.651 m)      Eyes: PERRL, lids and conjunctivae normal ENMT: Mucous membranes are moist. Posterior pharynx clear of any exudate or lesions. Neck: normal, supple, no masses, no thyromegaly. Respiratory: bilateral decreased breath sounds at bases, no wheezing, no crackles. Normal respiratory effort. No accessory muscle use.  Cardiovascular: S1 S2 positive, rate controlled. No extremity edema. 2+ pedal pulses.  Abdomen: no tenderness, no masses palpated. No hepatosplenomegaly. Bowel sounds positive.  Musculoskeletal: no clubbing / cyanosis. No joint deformity upper and lower extremities.  Skin: no  rashes, ulcers.  Small pustule present near the left angle of the mandible Neurologic: Awake, alert, answering questions, slow to respond.  CN 2-12 grossly intact. Moving extremities. No focal neurologic deficits.  Psychiatric: Normal judgment and insight. Alert and oriented x 3.   Labs on Admission: I have personally reviewed following labs and imaging studies  CBC: Recent Labs  Lab 07/20/24 1058 07/20/24 1105  WBC 6.5  --   NEUTROABS 5.3  --   HGB 12.3* 12.6*  HCT 38.4* 37.0*  MCV 90.4  --   PLT 100*  --    Basic Metabolic Panel: Recent Labs  Lab 07/20/24 1058 07/20/24 1105  NA 137 138  K 4.1 3.8  CL 101 100  CO2 25  --   GLUCOSE 80 81  BUN 19 19  CREATININE 0.70 0.70  CALCIUM 9.3  --    GFR: Estimated Creatinine Clearance: 64.4 mL/min (by C-G formula based on SCr of 0.7 mg/dL). Liver Function Tests: Recent Labs  Lab 07/20/24 1058  AST 44*  ALT 31  ALKPHOS 60  BILITOT 0.8  PROT 7.4  ALBUMIN  3.9   Recent Labs  Lab 07/20/24 1058  LIPASE 11   Recent Labs  Lab 07/20/24 1214  AMMONIA 19   Coagulation Profile: Recent Labs  Lab 07/20/24 1058  INR 1.1   Cardiac Enzymes: Recent Labs  Lab 07/20/24 1058  CKTOTAL 148   BNP (last 3 results) No results for input(s): PROBNP in the last 8760 hours. HbA1C: No results for input(s): HGBA1C in the last 72 hours. CBG: Recent Labs  Lab 07/20/24 1232 07/20/24 1350  GLUCAP 51* 172*   Lipid Profile: No results for input(s): CHOL, HDL, LDLCALC, TRIG, CHOLHDL, LDLDIRECT in the last 72 hours. Thyroid Function Tests: No results for input(s): TSH, T4TOTAL, FREET4, T3FREE, THYROIDAB in the last 72 hours. Anemia Panel: No results for input(s): VITAMINB12, FOLATE, FERRITIN, TIBC, IRON, RETICCTPCT in the last 72 hours. Urine analysis:    Component Value Date/Time   COLORURINE YELLOW 07/20/2024 1221   APPEARANCEUR CLEAR 07/20/2024 1221   LABSPEC 1.023 07/20/2024 1221    PHURINE 7.0 07/20/2024 1221   GLUCOSEU NEGATIVE 07/20/2024 1221   HGBUR NEGATIVE 07/20/2024 1221   BILIRUBINUR NEGATIVE 07/20/2024 1221  KETONESUR NEGATIVE 07/20/2024 1221   PROTEINUR NEGATIVE 07/20/2024 1221   UROBILINOGEN 0.2 05/05/2007 2151   NITRITE NEGATIVE 07/20/2024 1221   LEUKOCYTESUR NEGATIVE 07/20/2024 1221    Radiological Exams on Admission: CT CHEST ABDOMEN PELVIS W CONTRAST Result Date: 07/20/2024 CLINICAL DATA:  fall, AMS, h/o cancer EXAM: CT CHEST, ABDOMEN, AND PELVIS WITH CONTRAST TECHNIQUE: Multidetector CT imaging of the chest, abdomen and pelvis was performed following the standard protocol during bolus administration of intravenous contrast. RADIATION DOSE REDUCTION: This exam was performed according to the departmental dose-optimization program which includes automated exposure control, adjustment of the mA and/or kV according to patient size and/or use of iterative reconstruction technique. CONTRAST:  OMNIPAQUE  IOHEXOL  300 MG/ML  SOLN COMPARISON:  CT chest abdomen pelvis 03/14/2024, MRI abdomen 03/14/2024 FINDINGS: CHEST: Cardiovascular: No aortic injury. The thoracic aorta is normal in caliber. The heart is normal in size. No significant pericardial effusion. The main pulmonary artery is normal in caliber. No pulmonary embolus. Mild to moderate atherosclerotic plaque. Mediastinum/Nodes: No pneumomediastinum. No mediastinal hematoma. The esophagus is unremarkable. The thyroid is unremarkable. The central airways are patent. No mediastinal, hilar, or axillary lymphadenopathy. Lungs/Pleura: No focal consolidation. Stable punctate right lower lobe pulmonary micronodule no new pulmonary nodule. No pulmonary mass. No pulmonary contusion or laceration. No pneumatocele formation. No pleural effusion. No pneumothorax. No hemothorax. Musculoskeletal/Chest wall: No chest wall mass. No acute rib or sternal fracture. Please see separately dictated CT thoracolumbar spine. ABDOMEN /  PELVIS: Hepatobiliary: Not enlarged. Nodular hepatic contour. Similar-appearing heterogeneous right hepatic dome with findings better evaluated on MRI abdomen 03/14/2024. No laceration or subcapsular hematoma. The gallbladder is otherwise unremarkable with no radio-opaque gallstones. No biliary ductal dilatation. Pancreas: Normal pancreatic contour. No main pancreatic duct dilatation. Spleen: Not enlarged. No focal lesion. No laceration, subcapsular hematoma, or vascular injury. Adrenals/Urinary Tract: No nodularity bilaterally. 5 mm left nephrolithiasis. No right nephrolithiasis. No ureterolithiasis bilaterally. Bilateral kidneys enhance symmetrically. No hydronephrosis. No contusion, laceration, or subcapsular hematoma. No injury to the vascular structures or collecting systems. No hydroureter. The urinary bladder is unremarkable. On delayed imaging, there is no urothelial wall thickening and there are no filling defects in the opacified portions of the bilateral collecting systems or ureters. Stomach/Bowel: No small or large bowel wall thickening or dilatation. Colonic diverticulosis. The appendix is unremarkable. Vasculature/Lymphatics: No abdominal aorta or iliac aneurysm. No active contrast extravasation or pseudoaneurysm. No abdominal, pelvic, inguinal lymphadenopathy. Reproductive: Prostate is unremarkable. Other: No simple free fluid ascites. No pneumoperitoneum. No hemoperitoneum. No mesenteric hematoma identified. No organized fluid collection. Musculoskeletal: No significant soft tissue hematoma. No acute pelvic fracture. Please see separately dictated CT thoracolumbar spine. Other ports and devices: None. IMPRESSION: 1. No acute intrathoracic, intra-abdominal, intrapelvic traumatic injury. 2. Please see separately dictated CT thoracolumbar spine. Other imaging findings of potential clinical significance: 1. No pulmonary embolus. 2. Colonic diverticulosis with no acute diverticulitis. 3. Nonobstructive 5  mm left nephrolithiasis. 4. Cirrhosis with Similar-appearing heterogeneous right hepatic dome with findings better evaluated on MRI abdomen 03/14/2024. Please note that liver protocol enhanced MR liver protocol is the most sensitive tests for the screening detection of hepatocellular carcinoma in the high risk setting of cirrhosis. Electronically Signed   By: Morgane  Naveau M.D.   On: 07/20/2024 12:35   CT Cervical Spine Wo Contrast Result Date: 07/20/2024 CLINICAL DATA:  Neck trauma (Age >= 65y) Small lac and swelling on back of head. EXAM: CT CERVICAL, THORACIC, AND LUMBAR SPINE WITHOUT CONTRAST TECHNIQUE: Multidetector CT imaging of  the cervical, thoracic and lumbar spine was performed without intravenous contrast. Multiplanar CT image reconstructions were also generated. RADIATION DOSE REDUCTION: This exam was performed according to the departmental dose-optimization program which includes automated exposure control, adjustment of the mA and/or kV according to patient size and/or use of iterative reconstruction technique. COMPARISON:  None Available. FINDINGS: CT CERVICAL SPINE FINDINGS Alignment: Normal. Skull base and vertebrae: Multilevel moderate degenerative changes of the spine. No associated severe osseous neural foraminal or central canal stenosis. No acute fracture. No aggressive appearing focal osseous lesion or focal pathologic process. Soft tissues and spinal canal: No prevertebral fluid or swelling. No visible canal hematoma. Upper chest: Unremarkable. Other: None. CT THORACIC SPINE FINDINGS Alignment: Normal. Vertebrae: Multilevel mild degenerative changes of the spine. No acute fracture or focal pathologic process. Paraspinal and other soft tissues: Negative. Disc levels: Maintained. CT LUMBAR SPINE FINDINGS Segmentation: 5 lumbar type vertebrae. Alignment: Mild retrolisthesis of L2 on L3. Grade 1 anterolisthesis of L3 on L4. Mild retrolisthesis of L4 on L5. Dextroscoliosis of the lumbar spine  centered at the L2-L3 levels. Vertebrae: Left L5-S1 pseudoarthrosis. Multilevel severe degenerative changes of the spine. No associated severe osseous neural foraminal or central canal stenosis. No acute fracture or focal pathologic process. Paraspinal and other soft tissues: Negative. Disc levels: Maintained. IMPRESSION: No acute displaced fracture or traumatic listhesis of the cervical thoracic, lumbar spine. Electronically Signed   By: Morgane  Naveau M.D.   On: 07/20/2024 12:27   CT L-SPINE NO CHARGE Result Date: 07/20/2024 CLINICAL DATA:  Neck trauma (Age >= 65y) Small lac and swelling on back of head. EXAM: CT CERVICAL, THORACIC, AND LUMBAR SPINE WITHOUT CONTRAST TECHNIQUE: Multidetector CT imaging of the cervical, thoracic and lumbar spine was performed without intravenous contrast. Multiplanar CT image reconstructions were also generated. RADIATION DOSE REDUCTION: This exam was performed according to the departmental dose-optimization program which includes automated exposure control, adjustment of the mA and/or kV according to patient size and/or use of iterative reconstruction technique. COMPARISON:  None Available. FINDINGS: CT CERVICAL SPINE FINDINGS Alignment: Normal. Skull base and vertebrae: Multilevel moderate degenerative changes of the spine. No associated severe osseous neural foraminal or central canal stenosis. No acute fracture. No aggressive appearing focal osseous lesion or focal pathologic process. Soft tissues and spinal canal: No prevertebral fluid or swelling. No visible canal hematoma. Upper chest: Unremarkable. Other: None. CT THORACIC SPINE FINDINGS Alignment: Normal. Vertebrae: Multilevel mild degenerative changes of the spine. No acute fracture or focal pathologic process. Paraspinal and other soft tissues: Negative. Disc levels: Maintained. CT LUMBAR SPINE FINDINGS Segmentation: 5 lumbar type vertebrae. Alignment: Mild retrolisthesis of L2 on L3. Grade 1 anterolisthesis of L3 on  L4. Mild retrolisthesis of L4 on L5. Dextroscoliosis of the lumbar spine centered at the L2-L3 levels. Vertebrae: Left L5-S1 pseudoarthrosis. Multilevel severe degenerative changes of the spine. No associated severe osseous neural foraminal or central canal stenosis. No acute fracture or focal pathologic process. Paraspinal and other soft tissues: Negative. Disc levels: Maintained. IMPRESSION: No acute displaced fracture or traumatic listhesis of the cervical thoracic, lumbar spine. Electronically Signed   By: Morgane  Naveau M.D.   On: 07/20/2024 12:27   CT T-SPINE NO CHARGE Result Date: 07/20/2024 CLINICAL DATA:  Neck trauma (Age >= 65y) Small lac and swelling on back of head. EXAM: CT CERVICAL, THORACIC, AND LUMBAR SPINE WITHOUT CONTRAST TECHNIQUE: Multidetector CT imaging of the cervical, thoracic and lumbar spine was performed without intravenous contrast. Multiplanar CT image reconstructions were also generated. RADIATION DOSE  REDUCTION: This exam was performed according to the departmental dose-optimization program which includes automated exposure control, adjustment of the mA and/or kV according to patient size and/or use of iterative reconstruction technique. COMPARISON:  None Available. FINDINGS: CT CERVICAL SPINE FINDINGS Alignment: Normal. Skull base and vertebrae: Multilevel moderate degenerative changes of the spine. No associated severe osseous neural foraminal or central canal stenosis. No acute fracture. No aggressive appearing focal osseous lesion or focal pathologic process. Soft tissues and spinal canal: No prevertebral fluid or swelling. No visible canal hematoma. Upper chest: Unremarkable. Other: None. CT THORACIC SPINE FINDINGS Alignment: Normal. Vertebrae: Multilevel mild degenerative changes of the spine. No acute fracture or focal pathologic process. Paraspinal and other soft tissues: Negative. Disc levels: Maintained. CT LUMBAR SPINE FINDINGS Segmentation: 5 lumbar type vertebrae.  Alignment: Mild retrolisthesis of L2 on L3. Grade 1 anterolisthesis of L3 on L4. Mild retrolisthesis of L4 on L5. Dextroscoliosis of the lumbar spine centered at the L2-L3 levels. Vertebrae: Left L5-S1 pseudoarthrosis. Multilevel severe degenerative changes of the spine. No associated severe osseous neural foraminal or central canal stenosis. No acute fracture or focal pathologic process. Paraspinal and other soft tissues: Negative. Disc levels: Maintained. IMPRESSION: No acute displaced fracture or traumatic listhesis of the cervical thoracic, lumbar spine. Electronically Signed   By: Morgane  Naveau M.D.   On: 07/20/2024 12:27   CT Head Wo Contrast Result Date: 07/20/2024 CLINICAL DATA:  Brain metastases suspected Head trauma, minor (Age >= 65y) Mental status change, unknown cause EXAM: CT HEAD WITHOUT CONTRAST TECHNIQUE: Contiguous axial images were obtained from the base of the skull through the vertex without intravenous contrast. RADIATION DOSE REDUCTION: This exam was performed according to the departmental dose-optimization program which includes automated exposure control, adjustment of the mA and/or kV according to patient size and/or use of iterative reconstruction technique. COMPARISON:  CT head 10/31/2021 FINDINGS: Brain: No evidence of large-territorial acute infarction. No parenchymal hemorrhage. No mass lesion. No extra-axial collection. No mass effect or midline shift. No hydrocephalus. Basilar cisterns are patent. Vascular: No hyperdense vessel. Atherosclerotic calcifications are present within the cavernous internal carotid arteries. Skull: No acute fracture or focal lesion. Sinuses/Orbits: Paranasal sinuses and mastoid air cells are clear. Bilateral lens replacement. Otherwise the orbits are unremarkable. Other: None. IMPRESSION: No acute intracranial abnormality. Please note MRI with and without contrast is much more sensitive for the evaluation of metastases. Electronically Signed   By:  Morgane  Naveau M.D.   On: 07/20/2024 12:22   DG Pelvis Portable Result Date: 07/20/2024 CLINICAL DATA:  fall EXAM: PORTABLE PELVIS 1-2 VIEWS COMPARISON:  None Available. FINDINGS: There is no evidence of pelvic fracture or diastasis. No acute displaced fracture or dislocation of either hips. No pelvic bone lesions are seen. Poorly visualized sacrum- limited evaluation due to overlapping osseous structures and overlying soft tissues. Degenerative changes of visualized lower lumbar spine. Metallic densities overlying the right hip likely surgical anchors. IMPRESSION: Negative for acute traumatic injury. Electronically Signed   By: Morgane  Naveau M.D.   On: 07/20/2024 11:21   DG Chest Portable 1 View Result Date: 07/20/2024 CLINICAL DATA:  fall EXAM: PORTABLE CHEST 1 VIEW COMPARISON:  Chest x-ray 10/31/2021, CT chest 03/14/2024 FINDINGS: The heart and mediastinal contours are unchanged. Atherosclerotic plaque. No focal consolidation. No pulmonary edema. No pleural effusion. No pneumothorax. No acute osseous abnormality. IMPRESSION: 1. No active disease. 2.  Aortic Atherosclerosis (ICD10-I70.0). Electronically Signed   By: Morgane  Naveau M.D.   On: 07/20/2024 11:20   DG Hand  Complete Left Result Date: 07/20/2024 CLINICAL DATA:  fall EXAM: LEFT HAND - COMPLETE 3+ VIEW COMPARISON:  None Available. FINDINGS: There is no evidence of fracture or dislocation. First digit mild to moderate degenerative changes. Soft tissues are unremarkable. Vascular calcifications. IMPRESSION: No acute displaced fracture or dislocation. Electronically Signed   By: Morgane  Naveau M.D.   On: 07/20/2024 11:19    Assessment/Plan  Altered mental status with fall - Presented with fall and altered mental status with patient complaining of shakiness of hands prior to episode.  Imaging as above. -Unsure if his symptoms were because of hypoglycemia or seizures.  No obvious focal neurologic deficit noted.  Check MRI of brain. -  Monitor mental status.  Mental status has much improved.  Patient will be transferred [facility] for EEG as per neurology recommendations.  Will hold off on initiation of antiepileptics for now. - PT/OT/SLP evaluation  Diabetes mellitus type 2 with hypoglycemia - Blood sugars in the 60s and 50s on presentation which has improved with IV dextrose .  Continue IV dextrose  for now and DC if blood sugars remain elevated.  Hold off on long-acting insulin  for now.  Check A1c in AM.  Diabetes coordinator consult.  Carb modified diet  Hepatocellular carcinoma stage IIIa -tatus post ablation currently under surveillance.  Outpatient follow-up with oncology/Dr. Lanny.  Oncology has been added to care teams  Anemia of chronic disease -From chronic illness and cancer.  Hemoglobin stable.  Monitor intermittently  Chronic thrombocytopenia -Possibly from cancer.  No signs of bleeding.  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  Outpatient follow-up with PCP.  Might need outpatient GI evaluation  DVT prophylaxis: Lovenox  Code Status: Full Family Communication: Multiple family members including daughter, ex-wife at bedside Disposition Plan: Home in 1 to 2 days once clinically improved Consults called: Neurology by ED provider Admission status: Telemetry/observation Severity of Illness: The appropriate patient status for this patient is OBSERVATION. Observation status is judged to be reasonable and necessary in order to provide the required intensity of service to ensure the patient's safety. The patient's presenting symptoms, physical exam findings, and initial radiographic and laboratory data in the context of their medical condition is felt to place them at decreased risk for further clinical deterioration. Furthermore, it is anticipated that the patient will be medically stable for discharge from the hospital within 2 midnights of admission.     Sophie Mao MD [facility]  07/20/2024, 2:22 PM

## 2024-07-20 NOTE — ED Notes (Signed)
Report given to 3W RN

## 2024-07-20 NOTE — ED Triage Notes (Signed)
 Pt BIB EMS from home. Aox1 but that is not his baseline. Primarily speaks spanish. Has had multiple falls today. Small lac and swelling on back of head. Hx of DM. Unknown LOC, no thinners. Is a liver cancer pt here. Neighbor saw him yesterday and was normal but today he has fallen 3x in presence of neighbor.   EMS vitals  BP 164/84 HR 74 SPO2 99 CBG 62, OJ given, now 94

## 2024-07-20 NOTE — Procedures (Signed)
 Patient Name: Shawn Bailey  MRN: [id number]  Epilepsy Attending: Arlin MALVA Krebs  Referring Physician/Provider: Cheryle Page, MD  Date: 07/20/2024 Duration: 25.21 mins  Patient history: 78yo M with ams. EEG to evaluate for seizure  Level of alertness: Awake  AEDs during EEG study: None  Technical aspects: This EEG study was done with scalp electrodes positioned according to the 10-20 International system of electrode placement. Electrical activity was reviewed with band pass filter of 1-70Hz , sensitivity of 7 uV/mm, display speed of 68mm/sec with a 60Hz  notched filter applied as appropriate. EEG data were recorded continuously and digitally stored.  Video monitoring was available and reviewed as appropriate.  Description: The posterior dominant rhythm consists of 9 Hz activity of moderate voltage (25-35 uV) seen predominantly in posterior head regions, symmetric and reactive to eye opening and eye closing. Physiologic photic driving was not seen during photic stimulation.  Hyperventilation was not performed.     IMPRESSION: This study is within normal limits. No seizures or epileptiform discharges were seen throughout the recording.  A normal interictal EEG does not exclude the diagnosis of epilepsy.   Shawn Bailey

## 2024-07-20 NOTE — ED Provider Notes (Signed)
 [place] EMERGENCY DEPARTMENT AT [facility] Provider Note   CSN: [id number] Arrival date & time: 07/20/24  1022     Patient presents with: Fall and Altered Mental Status   Shawn Bailey is a 78 y.o. male with history of stage IV hepatocellular carcinoma, diabetes, presents emerged on today for evaluation for frequent falls.  Patient was found on the ground today by neighbor who called 911.  Per patient, he reports that he had woken up this morning to give himself his insulin  when he was feeling very shaky.  Reports he was making his coffee when he fell in the kitchen hitting his head on the back of the cabinet.  He reports that he tried to stand up 2 other times but could not stand up.  Unsure if he lost any consciousness.  Denies any blood thinner use.  Denies any focal weakness.  Only complaint is head pain where he hit his head on the handle of the cabinet per patient.  He denies any other symptoms.  Denies any chest pain or shortness of breath.  Patient is oriented to person and place and month but not year.  Spanish interpreter was brought in the room however patient answered in [place].  Daughter at bedside reports that patient has been having some memory issues off and on for the past few months however this morning was different than previous. She reports that he does live by himself and is worrying that he may note be using his insulin  correctly.   EMS notes reports that on arrival patient's blood sugar was 62 and was given orange juice and went into the 90s.  Otherwise was hemodynamically stable.  Fall  Altered Mental Status      Prior to Admission medications   Medication Sig Start Date End Date Taking? Authorizing Provider  Ascorbic Acid  (VITAMIN C) 100 MG tablet Take 100 mg by mouth daily.    [provider]  blood glucose meter kit and supplies Dispense based on patient and insurance preference. Use up to four times daily as directed. (FOR ICD-10  E10.9, E11.9). 11/03/21   Patsy Lenis, MD  insulin  glargine (LANTUS) 100 UNIT/ML injection 40 Units daily.    [provider]  Insulin  Pen Needle (PEN NEEDLES 3/16) 31G X 5 MM MISC 1 each by Does not apply route with breakfast, with lunch, and with evening meal. 11/03/21   Patsy Lenis, MD  Multiple Vitamin (MULTIVITAMIN WITH MINERALS) TABS tablet Take 1 tablet by mouth daily.    [provider]  ondansetron  (ZOFRAN ) 8 MG tablet Take 1 tablet (8 mg total) by mouth every 8 (eight) hours as needed for nausea or vomiting. Patient not taking: Reported on 03/06/2024 04/28/21   Thayil, Irene T, PA-C  pantoprazole  (PROTONIX ) 40 MG tablet Take 1 tablet (40 mg total) by mouth daily. Patient not taking: Reported on 03/06/2024 06/28/23   Lanny Callander, MD  polyethylene glycol (MIRALAX  / GLYCOLAX ) 17 g packet Take 17 g by mouth daily as needed for mild constipation. 03/08/21   Will Almarie MATSU, MD  prochlorperazine  (COMPAZINE ) 10 MG tablet Take 1 tablet (10 mg total) by mouth every 6 (six) hours as needed for nausea or vomiting. 04/28/21   Neomi Johnston DASEN, PA-C  senna (SENOKOT) 8.6 MG TABS tablet Take 1 tablet (8.6 mg total) by mouth 2 (two) times daily. Patient taking differently: Take 1 tablet by mouth 2 (two) times daily as needed for moderate constipation. 03/08/21   Will Almarie MATSU,  MD  vitamin B-12 (CYANOCOBALAMIN ) 100 MCG tablet Take 100 mcg by mouth daily.    [provider]    Allergies: Patient has no known allergies.    Review of Systems  Unable to perform ROS: Mental status change    Updated Vital Signs BP (!) 156/89   Pulse 83   Temp 97.9 F (36.6 C) (Oral)   Resp 16   Ht 5' 5 (1.651 m)   Wt 60.8 kg   SpO2 99%   BMI 22.30 kg/m   Physical Exam Vitals and nursing note reviewed.  Constitutional:      Appearance: He is not toxic-appearing.  HENT:     Head:     Comments: No battle signs or raccoon eyes.  Patient does have hematoma present to the  occiput of the head.  To his more right crown/parietal area, he does have a very small laceration that is hemostatic.  No step-off or deformity.    Mouth/Throat:     Mouth: Mucous membranes are moist.     Comments: Moist mucous membranes.  Poor dentition.  Patient does have some bruising present to the edges of the tongue. Eyes:     General: No scleral icterus. Neck:     Comments: In c-collar Cardiovascular:     Rate and Rhythm: Normal rate.     Pulses: Normal pulses.  Pulmonary:     Effort: Pulmonary effort is normal. No respiratory distress.     Comments: No overlying skin changes or signs of trauma.  Nontender to palpation. Chest:     Chest wall: No tenderness.  Abdominal:     Palpations: Abdomen is soft.     Tenderness: There is no abdominal tenderness. There is no guarding or rebound.     Comments: No overlying skin changes or signs of trauma.  Abdomen soft and nontender  Musculoskeletal:        General: No tenderness.     Comments: Patient does have some bruising seen to his left little finger however is nontender to palpation.  Full range of motion.  Pulses intact and symmetric in the bilateral upper and lower extremities.  He has no other tenderness to his arms or legs upon palpation or range of motion.  Skin:    General: Skin is warm and dry.     Comments: Patient does have small pustule present near the left angle of the mandible.  Neurological:     General: No focal deficit present.     Mental Status: He is alert.     Cranial Nerves: No cranial nerve deficit.     Motor: No weakness.     Comments: Patient is alert and oriented to person and place but not time.  Cranial nerves are intact.  Strength is symmetric in upper and lower extremities, including plantar and dorsiflexion. No asterixis.      (all labs ordered are listed, but only abnormal results are displayed) Labs Reviewed  CBC WITH DIFFERENTIAL/PLATELET - Abnormal; Notable for the following components:       Result Value   Hemoglobin 12.3 (*)    HCT 38.4 (*)    Platelets 100 (*)    Lymphs Abs 0.4 (*)    All other components within normal limits  COMPREHENSIVE METABOLIC PANEL WITH GFR - Abnormal; Notable for the following components:   AST 44 (*)    All other components within normal limits  CBG MONITORING, ED - Abnormal; Notable for the following components:   Glucose-Capillary 51 (*)  All other components within normal limits  I-STAT CHEM 8, ED - Abnormal; Notable for the following components:   Hemoglobin 12.6 (*)    HCT 37.0 (*)    All other components within normal limits  CULTURE, BLOOD (ROUTINE X 2)  CULTURE, BLOOD (ROUTINE X 2)  URINALYSIS, ROUTINE W REFLEX MICROSCOPIC  AMMONIA  LIPASE, BLOOD  PROTIME-INR  APTT  CK  I-STAT CG4 LACTIC ACID, ED  I-STAT CG4 LACTIC ACID, ED  CBG MONITORING, ED    EKG: None  Radiology: CT CHEST ABDOMEN PELVIS W CONTRAST Result Date: 07/20/2024 CLINICAL DATA:  fall, AMS, h/o cancer EXAM: CT CHEST, ABDOMEN, AND PELVIS WITH CONTRAST TECHNIQUE: Multidetector CT imaging of the chest, abdomen and pelvis was performed following the standard protocol during bolus administration of intravenous contrast. RADIATION DOSE REDUCTION: This exam was performed according to the departmental dose-optimization program which includes automated exposure control, adjustment of the mA and/or kV according to patient size and/or use of iterative reconstruction technique. CONTRAST:  OMNIPAQUE  IOHEXOL  300 MG/ML  SOLN COMPARISON:  CT chest abdomen pelvis 03/14/2024, MRI abdomen 03/14/2024 FINDINGS: CHEST: Cardiovascular: No aortic injury. The thoracic aorta is normal in caliber. The heart is normal in size. No significant pericardial effusion. The main pulmonary artery is normal in caliber. No pulmonary embolus. Mild to moderate atherosclerotic plaque. Mediastinum/Nodes: No pneumomediastinum. No mediastinal hematoma. The esophagus is unremarkable. The thyroid is  unremarkable. The central airways are patent. No mediastinal, hilar, or axillary lymphadenopathy. Lungs/Pleura: No focal consolidation. Stable punctate right lower lobe pulmonary micronodule no new pulmonary nodule. No pulmonary mass. No pulmonary contusion or laceration. No pneumatocele formation. No pleural effusion. No pneumothorax. No hemothorax. Musculoskeletal/Chest wall: No chest wall mass. No acute rib or sternal fracture. Please see separately dictated CT thoracolumbar spine. ABDOMEN / PELVIS: Hepatobiliary: Not enlarged. Nodular hepatic contour. Similar-appearing heterogeneous right hepatic dome with findings better evaluated on MRI abdomen 03/14/2024. No laceration or subcapsular hematoma. The gallbladder is otherwise unremarkable with no radio-opaque gallstones. No biliary ductal dilatation. Pancreas: Normal pancreatic contour. No main pancreatic duct dilatation. Spleen: Not enlarged. No focal lesion. No laceration, subcapsular hematoma, or vascular injury. Adrenals/Urinary Tract: No nodularity bilaterally. 5 mm left nephrolithiasis. No right nephrolithiasis. No ureterolithiasis bilaterally. Bilateral kidneys enhance symmetrically. No hydronephrosis. No contusion, laceration, or subcapsular hematoma. No injury to the vascular structures or collecting systems. No hydroureter. The urinary bladder is unremarkable. On delayed imaging, there is no urothelial wall thickening and there are no filling defects in the opacified portions of the bilateral collecting systems or ureters. Stomach/Bowel: No small or large bowel wall thickening or dilatation. Colonic diverticulosis. The appendix is unremarkable. Vasculature/Lymphatics: No abdominal aorta or iliac aneurysm. No active contrast extravasation or pseudoaneurysm. No abdominal, pelvic, inguinal lymphadenopathy. Reproductive: Prostate is unremarkable. Other: No simple free fluid ascites. No pneumoperitoneum. No hemoperitoneum. No mesenteric hematoma identified.  No organized fluid collection. Musculoskeletal: No significant soft tissue hematoma. No acute pelvic fracture. Please see separately dictated CT thoracolumbar spine. Other ports and devices: None. IMPRESSION: 1. No acute intrathoracic, intra-abdominal, intrapelvic traumatic injury. 2. Please see separately dictated CT thoracolumbar spine. Other imaging findings of potential clinical significance: 1. No pulmonary embolus. 2. Colonic diverticulosis with no acute diverticulitis. 3. Nonobstructive 5 mm left nephrolithiasis. 4. Cirrhosis with Similar-appearing heterogeneous right hepatic dome with findings better evaluated on MRI abdomen 03/14/2024. Please note that liver protocol enhanced MR liver protocol is the most sensitive tests for the screening detection of hepatocellular carcinoma in the high risk setting of  cirrhosis. Electronically Signed   By: Morgane  Naveau M.D.   On: 07/20/2024 12:35   CT Cervical Spine Wo Contrast Result Date: 07/20/2024 CLINICAL DATA:  Neck trauma (Age >= 65y) Small lac and swelling on back of head. EXAM: CT CERVICAL, THORACIC, AND LUMBAR SPINE WITHOUT CONTRAST TECHNIQUE: Multidetector CT imaging of the cervical, thoracic and lumbar spine was performed without intravenous contrast. Multiplanar CT image reconstructions were also generated. RADIATION DOSE REDUCTION: This exam was performed according to the departmental dose-optimization program which includes automated exposure control, adjustment of the mA and/or kV according to patient size and/or use of iterative reconstruction technique. COMPARISON:  None Available. FINDINGS: CT CERVICAL SPINE FINDINGS Alignment: Normal. Skull base and vertebrae: Multilevel moderate degenerative changes of the spine. No associated severe osseous neural foraminal or central canal stenosis. No acute fracture. No aggressive appearing focal osseous lesion or focal pathologic process. Soft tissues and spinal canal: No prevertebral fluid or swelling. No  visible canal hematoma. Upper chest: Unremarkable. Other: None. CT THORACIC SPINE FINDINGS Alignment: Normal. Vertebrae: Multilevel mild degenerative changes of the spine. No acute fracture or focal pathologic process. Paraspinal and other soft tissues: Negative. Disc levels: Maintained. CT LUMBAR SPINE FINDINGS Segmentation: 5 lumbar type vertebrae. Alignment: Mild retrolisthesis of L2 on L3. Grade 1 anterolisthesis of L3 on L4. Mild retrolisthesis of L4 on L5. Dextroscoliosis of the lumbar spine centered at the L2-L3 levels. Vertebrae: Left L5-S1 pseudoarthrosis. Multilevel severe degenerative changes of the spine. No associated severe osseous neural foraminal or central canal stenosis. No acute fracture or focal pathologic process. Paraspinal and other soft tissues: Negative. Disc levels: Maintained. IMPRESSION: No acute displaced fracture or traumatic listhesis of the cervical thoracic, lumbar spine. Electronically Signed   By: Morgane  Naveau M.D.   On: 07/20/2024 12:27   CT L-SPINE NO CHARGE Result Date: 07/20/2024 CLINICAL DATA:  Neck trauma (Age >= 65y) Small lac and swelling on back of head. EXAM: CT CERVICAL, THORACIC, AND LUMBAR SPINE WITHOUT CONTRAST TECHNIQUE: Multidetector CT imaging of the cervical, thoracic and lumbar spine was performed without intravenous contrast. Multiplanar CT image reconstructions were also generated. RADIATION DOSE REDUCTION: This exam was performed according to the departmental dose-optimization program which includes automated exposure control, adjustment of the mA and/or kV according to patient size and/or use of iterative reconstruction technique. COMPARISON:  None Available. FINDINGS: CT CERVICAL SPINE FINDINGS Alignment: Normal. Skull base and vertebrae: Multilevel moderate degenerative changes of the spine. No associated severe osseous neural foraminal or central canal stenosis. No acute fracture. No aggressive appearing focal osseous lesion or focal pathologic  process. Soft tissues and spinal canal: No prevertebral fluid or swelling. No visible canal hematoma. Upper chest: Unremarkable. Other: None. CT THORACIC SPINE FINDINGS Alignment: Normal. Vertebrae: Multilevel mild degenerative changes of the spine. No acute fracture or focal pathologic process. Paraspinal and other soft tissues: Negative. Disc levels: Maintained. CT LUMBAR SPINE FINDINGS Segmentation: 5 lumbar type vertebrae. Alignment: Mild retrolisthesis of L2 on L3. Grade 1 anterolisthesis of L3 on L4. Mild retrolisthesis of L4 on L5. Dextroscoliosis of the lumbar spine centered at the L2-L3 levels. Vertebrae: Left L5-S1 pseudoarthrosis. Multilevel severe degenerative changes of the spine. No associated severe osseous neural foraminal or central canal stenosis. No acute fracture or focal pathologic process. Paraspinal and other soft tissues: Negative. Disc levels: Maintained. IMPRESSION: No acute displaced fracture or traumatic listhesis of the cervical thoracic, lumbar spine. Electronically Signed   By: Morgane  Naveau M.D.   On: 07/20/2024 12:27   CT T-SPINE  NO CHARGE Result Date: 07/20/2024 CLINICAL DATA:  Neck trauma (Age >= 65y) Small lac and swelling on back of head. EXAM: CT CERVICAL, THORACIC, AND LUMBAR SPINE WITHOUT CONTRAST TECHNIQUE: Multidetector CT imaging of the cervical, thoracic and lumbar spine was performed without intravenous contrast. Multiplanar CT image reconstructions were also generated. RADIATION DOSE REDUCTION: This exam was performed according to the departmental dose-optimization program which includes automated exposure control, adjustment of the mA and/or kV according to patient size and/or use of iterative reconstruction technique. COMPARISON:  None Available. FINDINGS: CT CERVICAL SPINE FINDINGS Alignment: Normal. Skull base and vertebrae: Multilevel moderate degenerative changes of the spine. No associated severe osseous neural foraminal or central canal stenosis. No acute  fracture. No aggressive appearing focal osseous lesion or focal pathologic process. Soft tissues and spinal canal: No prevertebral fluid or swelling. No visible canal hematoma. Upper chest: Unremarkable. Other: None. CT THORACIC SPINE FINDINGS Alignment: Normal. Vertebrae: Multilevel mild degenerative changes of the spine. No acute fracture or focal pathologic process. Paraspinal and other soft tissues: Negative. Disc levels: Maintained. CT LUMBAR SPINE FINDINGS Segmentation: 5 lumbar type vertebrae. Alignment: Mild retrolisthesis of L2 on L3. Grade 1 anterolisthesis of L3 on L4. Mild retrolisthesis of L4 on L5. Dextroscoliosis of the lumbar spine centered at the L2-L3 levels. Vertebrae: Left L5-S1 pseudoarthrosis. Multilevel severe degenerative changes of the spine. No associated severe osseous neural foraminal or central canal stenosis. No acute fracture or focal pathologic process. Paraspinal and other soft tissues: Negative. Disc levels: Maintained. IMPRESSION: No acute displaced fracture or traumatic listhesis of the cervical thoracic, lumbar spine. Electronically Signed   By: Morgane  Naveau M.D.   On: 07/20/2024 12:27   CT Head Wo Contrast Result Date: 07/20/2024 CLINICAL DATA:  Brain metastases suspected Head trauma, minor (Age >= 65y) Mental status change, unknown cause EXAM: CT HEAD WITHOUT CONTRAST TECHNIQUE: Contiguous axial images were obtained from the base of the skull through the vertex without intravenous contrast. RADIATION DOSE REDUCTION: This exam was performed according to the departmental dose-optimization program which includes automated exposure control, adjustment of the mA and/or kV according to patient size and/or use of iterative reconstruction technique. COMPARISON:  CT head 10/31/2021 FINDINGS: Brain: No evidence of large-territorial acute infarction. No parenchymal hemorrhage. No mass lesion. No extra-axial collection. No mass effect or midline shift. No hydrocephalus. Basilar  cisterns are patent. Vascular: No hyperdense vessel. Atherosclerotic calcifications are present within the cavernous internal carotid arteries. Skull: No acute fracture or focal lesion. Sinuses/Orbits: Paranasal sinuses and mastoid air cells are clear. Bilateral lens replacement. Otherwise the orbits are unremarkable. Other: None. IMPRESSION: No acute intracranial abnormality. Please note MRI with and without contrast is much more sensitive for the evaluation of metastases. Electronically Signed   By: Morgane  Naveau M.D.   On: 07/20/2024 12:22   DG Pelvis Portable Result Date: 07/20/2024 CLINICAL DATA:  fall EXAM: PORTABLE PELVIS 1-2 VIEWS COMPARISON:  None Available. FINDINGS: There is no evidence of pelvic fracture or diastasis. No acute displaced fracture or dislocation of either hips. No pelvic bone lesions are seen. Poorly visualized sacrum- limited evaluation due to overlapping osseous structures and overlying soft tissues. Degenerative changes of visualized lower lumbar spine. Metallic densities overlying the right hip likely surgical anchors. IMPRESSION: Negative for acute traumatic injury. Electronically Signed   By: Morgane  Naveau M.D.   On: 07/20/2024 11:21   DG Chest Portable 1 View Result Date: 07/20/2024 CLINICAL DATA:  fall EXAM: PORTABLE CHEST 1 VIEW COMPARISON:  Chest x-ray 10/31/2021, CT  chest 03/14/2024 FINDINGS: The heart and mediastinal contours are unchanged. Atherosclerotic plaque. No focal consolidation. No pulmonary edema. No pleural effusion. No pneumothorax. No acute osseous abnormality. IMPRESSION: 1. No active disease. 2.  Aortic Atherosclerosis (ICD10-I70.0). Electronically Signed   By: Morgane  Naveau M.D.   On: 07/20/2024 11:20   DG Hand Complete Left Result Date: 07/20/2024 CLINICAL DATA:  fall EXAM: LEFT HAND - COMPLETE 3+ VIEW COMPARISON:  None Available. FINDINGS: There is no evidence of fracture or dislocation. First digit mild to moderate degenerative changes. Soft  tissues are unremarkable. Vascular calcifications. IMPRESSION: No acute displaced fracture or dislocation. Electronically Signed   By: Morgane  Naveau M.D.   On: 07/20/2024 11:19   Procedures   Medications Ordered in the ED  dextrose  5 % solution (has no administration in time range)  iohexol  (OMNIPAQUE ) 300 MG/ML solution 100 mL (100 mLs Intravenous Contrast Given 07/20/24 1153)    Medical Decision Making Amount and/or Complexity of Data Reviewed Labs: ordered. Radiology: ordered.  Risk Prescription drug management. Decision regarding hospitalization.   78 y.o. male presents to the ER for evaluation of falls, AMS. Differential diagnosis includes but is not limited to Drug-related, hypoxia, hyper/hypoglycemia, encephalopathy, sepsis, DKA/HHS, brain lesion, CVA, seizure, environmental, psychiatric, trauma, hepatic encephalopathy, malignancy. Vital signs mildly elevated blood pressure otherwise unremarkable. Physical exam as noted above.   On previous chart evaluation, it appears patient has history of Hepatocellular carcinoma and sees Dr. Lanny.  Patient does not appear in any acute distress.  Only reports some head pain where his laceration is.  Will update tetanus shot here.  No focal deficit appreciated.  He is not oriented to time.  Strength is intact.  Will obtain x-rays and CT imaging.  Did not see any trauma to the chest or abdomen is nontender to palpation.  I independently reviewed and interpreted the patient's labs.  CBC shows hemoglobin 12.3 with a hematocrit of 38.4 which is around patient's baseline.  No [place] ptosis.  CMP shows an AST of 44 otherwise no electrolyte or LFT abnormality.  CK within normal limits.  I-STAT within normal notes.  Lipase within normal limits.  APTT and PT/INR within normal limits.  Urinalysis unremarkable.  Ammonia within normal limits.  CT CAP 1. No acute intrathoracic, intra-abdominal, intrapelvic traumatic injury. 2. Please see separately dictated CT  thoracolumbar spine. Other imaging findings of potential clinical significance: 1. No pulmonary embolus. 2. Colonic diverticulosis with no acute diverticulitis. 3. Nonobstructive 5 mm left nephrolithiasis. 4. Cirrhosis with Similar-appearing heterogeneous right hepatic dome with findings better evaluated on MRI abdomen 03/14/2024. Please note that liver protocol enhanced MR liver protocol is the most sensitive tests for the screening detection of hepatocellular carcinoma in the high risk setting of cirrhosis.  CT C/T/L spine No acute displaced fracture or traumatic listhesis of the cervical thoracic, lumbar spine.   CT Head : No acute intracranial abnormality. Please note MRI with and without contrast is much more sensitive for the evaluation of metastases.  CXR  1. No active disease. 2.  Aortic Atherosclerosis  XR Pelvis : Negative for acute traumatic injury.   XR L hand No acute displaced fracture or dislocation.   While patient was here, his blood sugar started to drop again. He was given juice. I have started him on D5 infusion. I'm concerned that the patient may of had a seizure from hypoglycemia or from malignant process.   I consulted neurology and spoke to Dr. Lindzen who would like the patient transferred to  [facility] for EEG.  Could be having syncopal episodes from hypoglycemia or other cause however do feel that this needs he worked up further given patient's age and health history.  Admit to [facility] hospitalist.  For transfer over to [facility].  Spoke with patient and family who are agreeable to plan. Patient's small abrasion on head is still hemostatic and cleansed by nursing. It does not require suturing.   Portions of this report may have been transcribed using voice recognition software. Every effort was made to ensure accuracy; however, inadvertent computerized transcription errors may be present.    Final diagnoses:  None    ED Discharge Orders     None          Bernis Ernst,  [place] 07/20/24 1419    Simon Lavonia SAILOR, MD 07/20/24 [phone]

## 2024-07-20 NOTE — Progress Notes (Signed)
 EEG complete - results pending

## 2024-07-20 NOTE — Consult Note (Signed)
 NEUROLOGY CONSULT NOTE   Date of service: July 21, 2024 Patient Name: Shawn Bailey MRN:  [id number] DOB:  Mar 07, 1946 Chief Complaint: Seizure-like activity Requesting Provider: Rojelio Nest, DO  History of Present Illness  Chigozie Basaldua is a 78 y.o. male with hx of HCC (s/p ablation), cirrhosis, DM2 who presents after episode of full body shaking in the setting of low blood sugar.  Patient reports he woke up and was making coffee when his arms started to shake as well as his legs. He fell down and hit his head multiple times. He recalls all details without any lapse in time or loss of consciousness. He called his ex-wife who lives next door to him and was brought in by EMS. He was found to have BG 62 in the field.   He reports this has happened before. It occurs about 1x per week. He has never checked his sugar before with the prior episodes so unsure if had low BG then too.   ROS  Comprehensive ROS performed and pertinent positives documented in HPI    Past History   Past Medical History:  Diagnosis Date   Depression    Diabetes mellitus without complication (HCC)    Hepatocellular carcinoma (HCC) 03/08/2021   Parasite infestation     Past Surgical History:  Procedure Laterality Date   IR 3D INDEPENDENT WKST  05/25/2022   IR 3D INDEPENDENT WKST  11/09/2023   IR ANGIOGRAM SELECTIVE EACH ADDITIONAL VESSEL  07/01/2021   IR ANGIOGRAM SELECTIVE EACH ADDITIONAL VESSEL  07/22/2021   IR ANGIOGRAM SELECTIVE EACH ADDITIONAL VESSEL  07/22/2021   IR ANGIOGRAM SELECTIVE EACH ADDITIONAL VESSEL  07/22/2021   IR ANGIOGRAM SELECTIVE EACH ADDITIONAL VESSEL  07/22/2021   IR ANGIOGRAM SELECTIVE EACH ADDITIONAL VESSEL  05/25/2022   IR ANGIOGRAM SELECTIVE EACH ADDITIONAL VESSEL  05/25/2022   IR ANGIOGRAM SELECTIVE EACH ADDITIONAL VESSEL  05/25/2022   IR ANGIOGRAM SELECTIVE EACH ADDITIONAL VESSEL  05/25/2022   IR ANGIOGRAM SELECTIVE EACH ADDITIONAL VESSEL  05/25/2022   IR ANGIOGRAM SELECTIVE EACH  ADDITIONAL VESSEL  05/25/2022   IR ANGIOGRAM SELECTIVE EACH ADDITIONAL VESSEL  06/15/2022   IR ANGIOGRAM SELECTIVE EACH ADDITIONAL VESSEL  06/15/2022   IR ANGIOGRAM SELECTIVE EACH ADDITIONAL VESSEL  06/15/2022   IR ANGIOGRAM SELECTIVE EACH ADDITIONAL VESSEL  11/09/2023   IR ANGIOGRAM SELECTIVE EACH ADDITIONAL VESSEL  11/09/2023   IR ANGIOGRAM SELECTIVE EACH ADDITIONAL VESSEL  11/22/2023   IR ANGIOGRAM SELECTIVE EACH ADDITIONAL VESSEL  11/22/2023   IR ANGIOGRAM VISCERAL SELECTIVE  07/01/2021   IR ANGIOGRAM VISCERAL SELECTIVE  07/22/2021   IR ANGIOGRAM VISCERAL SELECTIVE  05/25/2022   IR ANGIOGRAM VISCERAL SELECTIVE  06/15/2022   IR ANGIOGRAM VISCERAL SELECTIVE  11/09/2023   IR ANGIOGRAM VISCERAL SELECTIVE  11/09/2023   IR ANGIOGRAM VISCERAL SELECTIVE  11/22/2023   IR EMBO ARTERIAL NOT HEMORR HEMANG INC GUIDE ROADMAPPING  07/01/2021   IR EMBO TUMOR ORGAN ISCHEMIA INFARCT INC GUIDE ROADMAPPING  07/22/2021   IR EMBO TUMOR ORGAN ISCHEMIA INFARCT INC GUIDE ROADMAPPING  06/15/2022   IR EMBO TUMOR ORGAN ISCHEMIA INFARCT INC GUIDE ROADMAPPING  11/22/2023   IR RADIOLOGIST EVAL & MGMT  06/01/2021   IR RADIOLOGIST EVAL & MGMT  04/26/2022   IR RADIOLOGIST EVAL & MGMT  07/12/2022   IR RADIOLOGIST EVAL & MGMT  10/05/2023   IR US  GUIDE VASC ACCESS RIGHT  07/01/2021   IR US  GUIDE VASC ACCESS RIGHT  07/22/2021   IR US  GUIDE VASC ACCESS RIGHT  05/25/2022  IR US  GUIDE VASC ACCESS RIGHT  06/15/2022   IR US  GUIDE VASC ACCESS RIGHT  11/09/2023   IR US  GUIDE VASC ACCESS RIGHT  11/22/2023   NO PAST SURGERIES      Family History: Family History  Problem Relation Age of Onset   Diabetes Mother     Social History  reports that he has never smoked. He has never used smokeless tobacco. He reports current alcohol use. He reports that he does not currently use drugs.  No Known Allergies  Medications   Current Facility-Administered Medications:    acetaminophen  (TYLENOL ) tablet 650 mg, 650 mg, Oral, Q6H PRN **OR** acetaminophen  (TYLENOL )  suppository 650 mg, 650 mg, Rectal, Q6H PRN, Cheryle, Kshitiz, MD   albuterol (PROVENTIL) (2.5 MG/3ML) 0.083% nebulizer solution 2.5 mg, 2.5 mg, Nebulization, Q2H PRN, Cheryle, Kshitiz, MD   enoxaparin  (LOVENOX ) injection 40 mg, 40 mg, Subcutaneous, Q24H, Alekh, Kshitiz, MD, 40 mg at 07/28/2024 1411   insulin  aspart (novoLOG ) injection 0-15 Units, 0-15 Units, Subcutaneous, Q4H, Alekh, Kshitiz, MD, 3 Units at 07/21/24 0430   ondansetron  (ZOFRAN ) tablet 4 mg, 4 mg, Oral, Q6H PRN **OR** ondansetron  (ZOFRAN ) injection 4 mg, 4 mg, Intravenous, Q6H PRN, Cheryle, Kshitiz, MD   Tdap (ADACEL) injection 0.5 mL, 0.5 mL, Intramuscular, Once, Ransom, Riley, PA-C  Facility-Administered Medications Ordered in Other Encounters:    0.9 %  sodium chloride  infusion, , Intravenous, Continuous, Lanny Callander, MD  Vitals   Vitals:   July 28, 2024 1744 Jul 28, 2024 2121 07/21/24 0116 07/21/24 0343  BP: (!) 154/84 (!) 177/94 129/67 128/63  Pulse: 77 89 80 72  Resp: 18 17 18 20   Temp: 99 F (37.2 C) 98.1 F (36.7 C) 98.5 F (36.9 C) 98.5 F (36.9 C)  TempSrc: Oral  Oral   SpO2: 100% 99% 98% 100%  Weight:      Height:        Body mass index is 22.3 kg/m.   Physical Exam   Constitutional: Appears thin. Psych: Affect appropriate to situation.  Eyes: No scleral injection.  HENT: No OP obstruction.  Head: Normocephalic.  Cardiovascular: Normal rate and regular rhythm.  Respiratory: Effort normal, non-labored breathing.  GI: Soft.  No distension.  Skin: WDI.   Neurologic Examination   Mental status: alert, oriented to person, place and time. Able to provide history. Speech: no dysarthria, word-finding difficulty, paraphasic errors. Cranial nerves: PERRL EOMI VF full Face sensation intact bilaterally. Face symmetric at rest and with activation. Hearing grossly intact. Palate elevation symmetric Tongue protrudes midline and has full range of motion. SCM's full strength bilaterally. Motor: Normal bulk and  tone. No abnormal movements. No asterixis.  RUE: shoulder abduction 5/5, biceps 5/5, triceps 5/5, wrist flexion 5/5, wrist extension 5/5, hand grip 5/5 LUE: shoulder abduction 5/5, biceps 5/5, triceps 5/5, wrist flexion 5/5, wrist extension 5/5, hand grip 5/5 RLE: hip flexion 5/5, knee flexion 5/5, knee extension 5/5, ankle dorsiflexion 5/5, plantar flexion 5/5 LLE: hip flexion 5/5, knee flexion 5/5, knee extension 5/5, ankle dorsiflexion 5/5, plantar flexion 5/5 Sensory: Grossly intact to light touch throughout. Reflexes: DTR's 2+ throughout. Downgoing toes bilaterally. Coordination: FTN intact. HTS intact. Gait: deferred   Labs/Imaging/Neurodiagnostic studies   CBC:  Recent Labs  Lab 07-28-2024 1058 July 28, 2024 1105 07/21/24 0502  WBC 6.5  --  4.5  NEUTROABS 5.3  --   --   HGB 12.3* 12.6* 13.0  HCT 38.4* 37.0* 37.8*  MCV 90.4  --  87.1  PLT 100*  --  106*  Basic Metabolic Panel:  Lab Results  Component Value Date   NA 137 07/21/2024   K 4.1 07/21/2024   CO2 27 07/21/2024   GLUCOSE 154 (H) 07/21/2024   BUN 14 07/21/2024   CREATININE 0.74 07/21/2024   CALCIUM 9.2 07/21/2024   GFRNONAA >60 07/21/2024   GFRAA >60 02/08/2016   Lipid Panel: No results found for: LDLCALC HgbA1c:  Lab Results  Component Value Date   HGBA1C 8.6 (H) 07/21/2024   Urine Drug Screen: No results found for: LABOPIA, COCAINSCRNUR, LABBENZ, AMPHETMU, THCU, LABBARB  Alcohol Level No results found for: [facility] INR  Lab Results  Component Value Date   INR 1.1 07/20/2024   APTT  Lab Results  Component Value Date   APTT 27 07/20/2024   AED levels: No results found for: PHENYTOIN, ZONISAMIDE, LAMOTRIGINE, LEVETIRACETA  CT Head without contrast 07/20/24 (Personally reviewed): No acute abnormality.  MRI Brain w/wo 07/20/24 (Personally reviewed): Motion degraded. No acute abnormality or abnormal enhancement.    ASSESSMENT   Kasir Hallenbeck is a 78 y.o. male with PMH  of HCC, cirrhosis, DM who presents with episodes of shaking with retained awareness and memory, not consistent with seizure. Tremulousness can occur in setting of hypoglycemia. With repeated occurrence of hypoglycemia in hospital, possibly having multiple dips in blood sugar at home secondary to progression of cirrhosis. MRI brain reassuringly without infarct, abnormal mass or metastasis.  RECOMMENDATIONS  - No further neurologic work-up at this time. - Please reach out with future concerns. ______________________________________________________________________    Signed, Hersey Maclellan M Rashawn Rayman, MD [facility]

## 2024-07-21 DIAGNOSIS — E162 Hypoglycemia, unspecified: Secondary | ICD-10-CM | POA: Insufficient documentation

## 2024-07-21 DIAGNOSIS — G9341 Metabolic encephalopathy: Secondary | ICD-10-CM | POA: Diagnosis not present

## 2024-07-21 DIAGNOSIS — E119 Type 2 diabetes mellitus without complications: Secondary | ICD-10-CM

## 2024-07-21 LAB — COMPREHENSIVE METABOLIC PANEL WITH GFR
ALT: 29 U/L (ref 0–44)
AST: 38 U/L (ref 15–41)
Albumin: 3.2 g/dL — ABNORMAL LOW (ref 3.5–5.0)
Alkaline Phosphatase: 49 U/L (ref 38–126)
Anion gap: 9 (ref 5–15)
BUN: 14 mg/dL (ref 8–23)
CO2: 27 mmol/L (ref 22–32)
Calcium: 9.2 mg/dL (ref 8.9–10.3)
Chloride: 101 mmol/L (ref 98–111)
Creatinine, Ser: 0.74 mg/dL (ref 0.61–1.24)
GFR, Estimated: >60 mL/min (ref >60)
Glucose, Bld: 154 mg/dL — ABNORMAL HIGH (ref 70–99)
Potassium: 4.1 mmol/L (ref 3.5–5.1)
Sodium: 137 mmol/L (ref 135–145)
Total Bilirubin: 1 mg/dL (ref 0.0–1.2)
Total Protein: 7.2 g/dL (ref 6.5–8.1)

## 2024-07-21 LAB — GLUCOSE, CAPILLARY
Glucose-Capillary: 226 mg/dL — ABNORMAL HIGH (ref 70–99)
Glucose-Capillary: 196 mg/dL — ABNORMAL HIGH (ref 70–99)
Glucose-Capillary: 271 mg/dL — ABNORMAL HIGH (ref 70–99)
Glucose-Capillary: 224 mg/dL — ABNORMAL HIGH (ref 70–99)

## 2024-07-21 LAB — CBC
HCT: 37.8 % — ABNORMAL LOW (ref 39.0–52.0)
Hemoglobin: 13 g/dL (ref 13.0–17.0)
MCH: 30 pg (ref 26.0–34.0)
MCHC: 34.4 g/dL (ref 30.0–36.0)
MCV: 87.1 fL (ref 80.0–100.0)
Platelets: 106 K/uL — ABNORMAL LOW (ref 150–400)
RBC: 4.34 MIL/uL (ref 4.22–5.81)
RDW: 12.5 % (ref 11.5–15.5)
WBC: 4.5 K/uL (ref 4.0–10.5)
nRBC: 0 % (ref 0.0–0.2)

## 2024-07-21 LAB — HEMOGLOBIN A1C
Hgb A1c MFr Bld: 8.6 % — ABNORMAL HIGH (ref 4.8–5.6)
Mean Plasma Glucose: 200.12 mg/dL

## 2024-07-21 LAB — MAGNESIUM: Magnesium: 1.9 mg/dL (ref 1.7–2.4)

## 2024-07-21 LAB — AMMONIA: Ammonia: 37 umol/L — ABNORMAL HIGH (ref 9–35)

## 2024-07-21 LAB — TSH: TSH: 1.246 u[IU]/mL (ref 0.350–4.500)

## 2024-07-21 LAB — FOLATE: Folate: >20 ng/mL (ref >5.9)

## 2024-07-21 LAB — VITAMIN B12: Vitamin B-12: 673 pg/mL (ref 180–914)

## 2024-07-21 MED ORDER — GVOKE HYPOPEN 2-PACK 1 MG/0.2ML ~~LOC~~ SOAJ: 1.0000 mg | SUBCUTANEOUS | 0 refills | Status: AC | PRN

## 2024-07-21 MED ORDER — INSULIN LISPRO (1 UNIT DIAL) 100 UNIT/ML (KWIKPEN): PEN_INJECTOR | SUBCUTANEOUS | 11 refills | Status: AC

## 2024-07-21 MED ORDER — INSULIN ASPART 100 UNIT/ML IJ SOLN
0.0000 [IU] | Freq: Three times a day (TID) | INTRAMUSCULAR | Status: DC
  Administered 2024-07-21: 5 [IU] via SUBCUTANEOUS

## 2024-07-21 MED ORDER — INSULIN GLARGINE 100 UNIT/ML ~~LOC~~ SOLN: 10.0000 [IU] | Freq: Every day | SUBCUTANEOUS | Status: DC

## 2024-07-21 NOTE — Evaluation (Signed)
 Occupational Therapy Evaluation Patient Details Name: Shawn Bailey MRN: [id number] DOB: 1946/06/29 Today's Date: 07/21/2024   History of Present Illness   78 y.o. male presents to [facility] 07/20/24 after episode of full body shaking 2/2 low blood sugar. Pt fell and hit his head multiple times, no LOC. EEG and MRI brain negative. PMHx: HCC (s/p ablation), cirrhosis, DM2     Clinical Impressions Pt ind at baseline with ADLs and uses cane PRN for mobility, lives alone but his ex-wife lives next door and assists with IADLs and transportation. Pt currently supervision level overall for ADLs, ind with bed mobility and supervision for transfers without external assist. Pt with mild unsteadiness, intermittently reaching out for external support. Pt presenting with impairments listed below, will follow acutely. Anticipate no OT follow up needs at d/c.      If plan is discharge home, recommend the following:   A little help with walking and/or transfers;A little help with bathing/dressing/bathroom;Assistance with cooking/housework;Direct supervision/assist for medications management;Direct supervision/assist for financial management;Assist for transportation;Help with stairs or ramp for entrance     Functional Status Assessment   Patient has had a recent decline in their functional status and demonstrates the ability to make significant improvements in function in a reasonable and predictable amount of time.     Equipment Recommendations   None recommended by OT     Recommendations for Other Services   PT consult     Precautions/Restrictions   Precautions Precautions: Fall Recall of Precautions/Restrictions: Intact Restrictions Weight Bearing Restrictions Per Provider Order: No     Mobility Bed Mobility Overal bed mobility: Independent                  Transfers Overall transfer level: Needs assistance Equipment used: None Transfers: Sit to/from Stand Sit to  Stand: Supervision                  Balance Overall balance assessment: History of Falls, Needs assistance Sitting-balance support: No upper extremity supported, Feet supported Sitting balance-Leahy Scale: Normal     Standing balance support: No upper extremity supported Standing balance-Leahy Scale: Fair                             ADL either performed or assessed with clinical judgement   ADL Overall ADL's : Needs assistance/impaired Eating/Feeding: Supervision/ safety   Grooming: Supervision/safety   Upper Body Bathing: Supervision/ safety   Lower Body Bathing: Supervison/ safety   Upper Body Dressing : Supervision/safety   Lower Body Dressing: Supervision/safety   Toilet Transfer: Supervision/safety   Toileting- Clothing Manipulation and Hygiene: Supervision/safety       Functional mobility during ADLs: Supervision/safety       Vision   Vision Assessment?: No apparent visual deficits     Perception Perception: Not tested       Praxis Praxis: Not tested       Pertinent Vitals/Pain Pain Assessment Pain Assessment: No/denies pain     Extremity/Trunk Assessment Upper Extremity Assessment Upper Extremity Assessment: Overall WFL for tasks assessed   Lower Extremity Assessment Lower Extremity Assessment: Defer to PT evaluation   Cervical / Trunk Assessment Cervical / Trunk Assessment: Normal   Communication Communication Communication: No apparent difficulties   Cognition Arousal: Alert Behavior During Therapy: WFL for tasks assessed/performed Cognition: No apparent impairments  Following commands: Intact       Cueing  General Comments   Cueing Techniques: Verbal cues;Tactile cues  VSS on RA   Exercises     Shoulder Instructions      Home Living Family/patient expects to be discharged to:: Private residence Living Arrangements: Alone Available Help at Discharge:  Neighbor;Available PRN/intermittently (ex wife lives next door) Type of Home: House Home Access: Level entry     Home Layout: One level     Bathroom Shower/Tub: Walk-in shower         Home Equipment: Cane - single point;Grab bars - tub/shower          Prior Functioning/Environment Prior Level of Function : Independent/Modified Independent;History of Falls (last six months)             Mobility Comments: Ind with no AD in home, SP cane in community. Multiple falls when blood sugar is low ADLs Comments: Ind, ex-wife helps with driving and grocery shopping, pt manages own meds    OT Problem List: Decreased strength;Decreased range of motion;Decreased activity tolerance;Impaired balance (sitting and/or standing)   OT Treatment/Interventions: Self-care/ADL training;Therapeutic exercise;Energy conservation;DME and/or AE instruction;Therapeutic activities;Balance training;Patient/family education      OT Goals(Current goals can be found in the care plan section)   Acute Rehab OT Goals Patient Stated Goal: to get OOB OT Goal Formulation: With patient Time For Goal Achievement: 08/04/24 Potential to Achieve Goals: Good ADL Goals Pt Will Perform Upper Body Dressing: with modified independence;sitting Pt Will Perform Lower Body Dressing: with modified independence;sitting/lateral leans;sit to/from stand Pt Will Transfer to Toilet: with modified independence;ambulating;regular height toilet Pt Will Perform Tub/Shower Transfer: Shower transfer;Independently;ambulating   OT Frequency:  Min 1X/week    Co-evaluation   Reason for Co-Treatment: For patient/therapist safety;To address functional/ADL transfers PT goals addressed during session: Mobility/safety with mobility;Balance        AM-PAC OT 6 Clicks Daily Activity     Outcome Measure Help from another person eating meals?: A Little Help from another person taking care of personal grooming?: A Little Help from  another person toileting, which includes using toliet, bedpan, or urinal?: A Little Help from another person bathing (including washing, rinsing, drying)?: A Little Help from another person to put on and taking off regular upper body clothing?: A Little Help from another person to put on and taking off regular lower body clothing?: A Little 6 Click Score: 18   End of Session Equipment Utilized During Treatment: Gait belt Nurse Communication: Mobility status  Activity Tolerance: Patient tolerated treatment well Patient left: in chair;with call bell/phone within reach;with chair alarm set  OT Visit Diagnosis: Unsteadiness on feet (R26.81);Other abnormalities of gait and mobility (R26.89);Muscle weakness (generalized) (M62.81)                Time: 9260-9240 OT Time Calculation (min): 20 min Charges:  OT General Charges $OT Visit: 1 Visit OT Evaluation $OT Eval Low Complexity: 1 Low  Brysun Eschmann K, OTD, OTR/L SecureChat [facility] Rehab [phone]   Hilario Robarts K Koonce 07/21/2024, 10:45 AM

## 2024-07-21 NOTE — Care Management Obs Status (Signed)
 MEDICARE OBSERVATION STATUS NOTIFICATION   Patient Details  Name: Dent Plantz MRN: [id number] Date of Birth: 10-13-1946   Medicare Observation Status Notification Given:  Yes    Alauna Hayden G., RN 07/21/2024, 1:07 PM

## 2024-07-21 NOTE — Discharge Instructions (Addendum)
 Check your blood sugar 4 times daily: before breakfast, before lunch, before dinner, before bedtime. You may also check throughout the day if you feel that your blood sugar is too low or too high.  Lantus is your long-acting insulin . This should be used at the same time each day. Start with 10 units once daily.  Novolog  is your short-acting correction insulin . Check your blood sugar prior to your meal, three times daily. Then depending on your blood sugar, inject insulin  as follows: Blood Glucose 121 - 150: 1 units  BG 151 - 200: 2 units  BG 201 - 250: 3 units  BG 251 - 300: 5 units  BG 301 - 350: 7 units  BG 351 - 400: 9 units  Keep a journal of your blood sugar results each day. Bring to your primary care physician.

## 2024-07-21 NOTE — Evaluation (Signed)
 Physical Therapy Evaluation Patient Details Name: Shawn Bailey MRN: [id number] DOB: [birth year] Today's Date: 07/21/2024  History of Present Illness  78 y.o. male presents to [facility] 07/20/24 after episode of full body shaking 2/2 low blood sugar. Pt fell and hit his head multiple times, no LOC. EEG and MRI brain negative. PMHx: HCC (s/p ablation), cirrhosis, DM2  Clinical Impression  Pt in bed upon arrival and agreeable to PT eval. PTA, pt would ambulate with no AD in the home and SP cane in the community. Pt can be unsteady at baseline with episodes of falling related to low blood sugar. Pt presents close to baseline with CGA to ambulate with no AD with no challenges to balance. When turning, pt had a slight loss of balance requiring MinA to correct. Recommending OP PT follow-up to address balance deficits as long as pt has a ride. Pt has intermittent level of assist available at home. Pt would benefit from acute skilled PT with current functional limitations listed below (see PT Problem List). Acute PT to follow.         If plan is discharge home, recommend the following: Assist for transportation;Help with stairs or ramp for entrance   Can travel by private vehicle    Yes    Equipment Recommendations None recommended by PT     Functional Status Assessment Patient has had a recent decline in their functional status and demonstrates the ability to make significant improvements in function in a reasonable and predictable amount of time.     Precautions / Restrictions Precautions Precautions: Fall Recall of Precautions/Restrictions: Intact Restrictions Weight Bearing Restrictions Per Provider Order: No      Mobility  Bed Mobility Overal bed mobility: Independent   Transfers Overall transfer level: Needs assistance Equipment used: None Transfers: Sit to/from Stand Sit to Stand: Supervision   Ambulation/Gait Ambulation/Gait assistance: Min assist, Contact guard assist Gait  Distance (Feet): 200 Feet Assistive device: None Gait Pattern/deviations: Step-through pattern, Decreased stride length Gait velocity: decr     General Gait Details: steady gait with no challenges to balance, MinA when turning due to lateral LOB    Balance Overall balance assessment: History of Falls, Needs assistance Sitting-balance support: No upper extremity supported, Feet supported Sitting balance-Leahy Scale: Normal     Standing balance support: No upper extremity supported Standing balance-Leahy Scale: Fair Standing balance comment: able to stand statically with no UE or external support         Pertinent Vitals/Pain Pain Assessment Pain Assessment: No/denies pain    Home Living Family/patient expects to be discharged to:: Private residence Living Arrangements: Alone Available Help at Discharge: Neighbor;Available PRN/intermittently (ex-wife lives next door) Type of Home: House Home Access: Level entry       Home Layout: One level Home Equipment: Cane - single point;Grab bars - tub/shower      Prior Function Prior Level of Function : Independent/Modified Independent;History of Falls (last six months)    Mobility Comments: Ind with no AD in home, SP cane in community. Multiple falls when blood sugar is low ADLs Comments: Ind, ex-wife helps with driving     Extremity/Trunk Assessment   Upper Extremity Assessment Upper Extremity Assessment: Defer to OT evaluation    Lower Extremity Assessment Lower Extremity Assessment: Overall WFL for tasks assessed    Cervical / Trunk Assessment Cervical / Trunk Assessment: Normal  Communication   Communication Communication: No apparent difficulties    Cognition Arousal: Alert Behavior During Therapy: [facility] for tasks assessed/performed  PT - Cognitive impairments: No apparent impairments    Following commands: Intact       Cueing Cueing Techniques: Verbal cues, Tactile cues     General Comments General  comments (skin integrity, edema, etc.): VSS on RA     PT Assessment Patient needs continued PT services  PT Problem List Decreased activity tolerance;Decreased balance;Decreased mobility       PT Treatment Interventions DME instruction;Gait training;Functional mobility training;Therapeutic activities;Therapeutic exercise;Balance training;Neuromuscular re-education;Patient/family education    PT Goals (Current goals can be found in the Care Plan section)  Acute Rehab PT Goals Patient Stated Goal: to go home PT Goal Formulation: With patient Time For Goal Achievement: 08/04/24 Potential to Achieve Goals: Good    Frequency Min 1X/week     Co-evaluation   Reason for Co-Treatment: For patient/therapist safety;To address functional/ADL transfers PT goals addressed during session: Mobility/safety with mobility;Balance         AM-PAC PT 6 Clicks Mobility  Outcome Measure Help needed turning from your back to your side while in a flat bed without using bedrails?: None Help needed moving from lying on your back to sitting on the side of a flat bed without using bedrails?: None Help needed moving to and from a bed to a chair (including a wheelchair)?: A Little Help needed standing up from a chair using your arms (e.g., wheelchair or bedside chair)?: A Little Help needed to walk in hospital room?: A Little Help needed climbing 3-5 steps with a railing? : A Little 6 Click Score: 20    End of Session Equipment Utilized During Treatment: Gait belt Activity Tolerance: Patient tolerated treatment well Patient left: in chair;with call bell/phone within reach;with chair alarm set Nurse Communication: Mobility status PT Visit Diagnosis: Other abnormalities of gait and mobility (R26.89);History of falling (Z91.81)    Time: 0740-0800 PT Time Calculation (min) (ACUTE ONLY): 20 min   Charges:   PT Evaluation $PT Eval Low Complexity: 1 Low   PT General Charges $$ ACUTE PT VISIT: 1  Visit       Kate ORN, PT, DPT Secure Chat Preferred  Rehab Office [phone]   Kate BRAVO Wendolyn 07/21/2024, 8:58 AM

## 2024-07-21 NOTE — Discharge Summary (Signed)
 Physician Discharge Summary  Shawn Bailey FMW:[id number] DOB: [birth year] DOA: 07/20/2024  PCP: Norval Kettle, MD  Admit date: 07/20/2024 Discharge date: 07/21/2024  Admitted From: Home Disposition:  Home   Recommendations for Outpatient Follow-up:  Follow up with PCP   Discharge Condition: Stable CODE STATUS: Full code Diet recommendation: Carb modified diet  Brief/Interim Summary: From H&P: Shawn Bailey is a 78 y.o. 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  Patient woke up this morning to give himself his insulin  when he felt very shaky.  He was making his coffee in the kitchen when he fell, hitting his head on the back of the cabinet.  He tried to stand up 2 times but could not stand up.  Unclear if the patient lost consciousness.  No bowel or bladder incontinence reported.  He called his ex-wife who is also his next-door neighbor who apparently called 911.  No recent fever, vomiting, chest pain, abdominal pain, weakness of extremities, diarrhea, dysuria, hematuria reported.  Patient lives by himself and gives insulin  to himself.  He has had fluctuating blood sugars with recent blood sugars fluctuating in the mid 300s to the 40s.  EMS noted that the patient's blood sugar was 62 for which she was given oral juice and blood sugar went to the 90s.   ED Course: Patient was awake, alert and oriented and answering questions appropriately although slow to respond.  CBG was 51.  CT of chest, abdomen and pelvis with contrast showed no acute intrathoracic, intra-abdominal, intrapelvic traumatic injury but showed findings suggestive of cirrhosis.  CT of the head, cervical and T-spine showed no acute intracranial abnormality or any displaced fractures.  X-ray of pelvis and left hand did not show any  fractures.  Chest x-ray did not show any active disease.  ED provider spoke to neurology on-call/Dr. Lindzen who recommended patient be transferred to [facility] for EEG. Hospitalist service was called to evaluate the patient.   Patient was evaluated by neurology and also underwent EEG.  Neurology did not feel that his episode was secondary to seizure, rather due to hypoglycemic episodes.  Patient was counseled to decrease his Lantus and to cover with mealtime sliding scale insulin .  This was discussed with patient as well as his caregiver who lives next-door.  Patient stable for discharge and follow-up closely with primary care physician.  Discharge Diagnoses:   Principal Problem:   Acute metabolic encephalopathy Active Problems:   Hepatocellular carcinoma (HCC)   Hypoglycemia   DM type 2 (diabetes mellitus, type 2) (HCC)  Discharge Instructions  Discharge Instructions     Call MD for:  difficulty breathing, headache or visual disturbances   Complete by: As directed    Call MD for:  extreme fatigue   Complete by: As directed    Call MD for:  persistant dizziness or light-headedness   Complete by: As directed    Call MD for:  persistant nausea and vomiting   Complete by: As directed    Call MD for:  severe uncontrolled pain   Complete by: As directed    Call MD for:  temperature >100.4   Complete by: As directed    Diet Carb Modified   Complete by: As directed    Discharge instructions   Complete by: As directed    You were cared for by a hospitalist during your hospital  stay. If you have any questions about your discharge medications or the care you received while you were in the hospital after you are discharged, you can call the unit and ask to speak with the hospitalist on call if the hospitalist that took care of you is not available. Once you are discharged, your primary care physician will handle any further medical issues. Please note that NO REFILLS for any discharge  medications will be authorized once you are discharged, as it is imperative that you return to your primary care physician (or establish a relationship with a primary care physician if you do not have one) for your aftercare needs so that they can reassess your need for medications and monitor your lab values.   Increase activity slowly   Complete by: As directed       Allergies as of 07/21/2024   No Known Allergies      Medication List     TAKE these medications    blood glucose meter kit and supplies Dispense based on patient and insurance preference. Use up to four times daily as directed. (FOR ICD-10 E10.9, E11.9).   Gvoke HypoPen 2-Pack 1 MG/0.2ML Soaj Generic drug: Glucagon Inject 1 mg into the skin as needed for up to 2 doses (Severe low blood sugar).   insulin  glargine 100 UNIT/ML injection Commonly known as: LANTUS Inject 0.1 mLs (10 Units total) into the skin daily. Start taking on: July 22, 2024 What changed:  how much to take how to take this   insulin  lispro 100 UNIT/ML KwikPen Commonly known as: HUMALOG Check your blood sugar prior to your meal, three times daily. Then depending on your blood sugar, inject insulin  as follows: Blood Glucose 121 - 150: 1 units.. BG 151 - 200: 2 units.. BG 201 - 250: 3 units.. BG 251 - 300: 5 units.. BG 301 - 350: 7 units.. BG 351 - 400: 9 units.   multivitamin with minerals Tabs tablet Take 1 tablet by mouth daily.   Pen Needles 3/16 31G X 5 MM Misc 1 each by Does not apply route with breakfast, with lunch, and with evening meal.   vitamin B-12 100 MCG tablet Commonly known as: CYANOCOBALAMIN  Take 100 mcg by mouth daily.   vitamin C 100 MG tablet Take 100 mg by mouth daily.        Follow-up Information     Norval Kettle, MD. Schedule an appointment as soon as possible for a visit in 1 week(s).   Specialty: Internal Medicine Contact information: [address] [place] [address] [phone]                 No Known Allergies   Procedures/Studies: MR BRAIN W WO CONTRAST Result Date: 07/20/2024 EXAM: MRI BRAIN WITH AND WITHOUT CONTRAST 07/20/2024 10:20:22 PM TECHNIQUE: Multiplanar multisequence MRI of the head/brain was performed with and without the administration of 6 mL gadobutrol  (GADAVIST ) 1 MMOL/ML injection. COMPARISON: None available. CLINICAL HISTORY: Altered mental status, nontraumatic. FINDINGS: BRAIN AND VENTRICLES: Multifocal hyperintense T2-weighted signal within the cerebral white matter, most commonly due to chronic small vessel disease. No acute infarct. No acute intracranial hemorrhage. No mass effect or midline shift. No hydrocephalus. The sella is unremarkable. Normal flow voids. No mass or abnormal enhancement. ORBITS: Ocular lens replacements. No acute abnormality. SINUSES: No acute abnormality. BONES AND SOFT TISSUES: Normal bone marrow signal and enhancement. No acute soft tissue abnormality. IMPRESSION: 1. No acute intracranial abnormality. 2. Multifocal hyperintense T2-weighted signal within the  cerebral white matter, most commonly due to chronic small vessel disease. Electronically signed by: Franky Stanford MD 07/20/2024 10:30 PM EDT RP Workstation: [id number]   EEG adult Result Date: 07/20/2024 Shelton Arlin KIDD, MD     07/21/2024  5:53 AM Patient Name: Shawn Bailey MRN: [id number] Epilepsy Attending: Arlin KIDD Shelton Referring Physician/Provider: Cheryle Page, MD Date: 07/20/2024 Duration: 25.21 mins Patient history: 78yo M with ams. EEG to evaluate for seizure Level of alertness: Awake AEDs during EEG study: None Technical aspects: This EEG study was done with scalp electrodes positioned according to the 10-20 International system of electrode placement. Electrical activity was reviewed with band pass filter of 1-70Hz , sensitivity of 7 uV/mm, display speed of 58mm/sec with a 60Hz  notched filter applied as appropriate. EEG data were recorded continuously and digitally  stored.  Video monitoring was available and reviewed as appropriate. Description: The posterior dominant rhythm consists of 9 Hz activity of moderate voltage (25-35 uV) seen predominantly in posterior head regions, symmetric and reactive to eye opening and eye closing. Physiologic photic driving was not seen during photic stimulation.  Hyperventilation was not performed.   IMPRESSION: This study is within normal limits. No seizures or epileptiform discharges were seen throughout the recording. A normal interictal EEG does not exclude the diagnosis of epilepsy. Arlin KIDD Shelton   CT CHEST ABDOMEN PELVIS W CONTRAST Result Date: 07/20/2024 CLINICAL DATA:  fall, AMS, h/o cancer EXAM: CT CHEST, ABDOMEN, AND PELVIS WITH CONTRAST TECHNIQUE: Multidetector CT imaging of the chest, abdomen and pelvis was performed following the standard protocol during bolus administration of intravenous contrast. RADIATION DOSE REDUCTION: This exam was performed according to the departmental dose-optimization program which includes automated exposure control, adjustment of the mA and/or kV according to patient size and/or use of iterative reconstruction technique. CONTRAST:  OMNIPAQUE  IOHEXOL  300 MG/ML  SOLN COMPARISON:  CT chest abdomen pelvis 03/14/2024, MRI abdomen 03/14/2024 FINDINGS: CHEST: Cardiovascular: No aortic injury. The thoracic aorta is normal in caliber. The heart is normal in size. No significant pericardial effusion. The main pulmonary artery is normal in caliber. No pulmonary embolus. Mild to moderate atherosclerotic plaque. Mediastinum/Nodes: No pneumomediastinum. No mediastinal hematoma. The esophagus is unremarkable. The thyroid is unremarkable. The central airways are patent. No mediastinal, hilar, or axillary lymphadenopathy. Lungs/Pleura: No focal consolidation. Stable punctate right lower lobe pulmonary micronodule no new pulmonary nodule. No pulmonary mass. No pulmonary contusion or laceration. No  pneumatocele formation. No pleural effusion. No pneumothorax. No hemothorax. Musculoskeletal/Chest wall: No chest wall mass. No acute rib or sternal fracture. Please see separately dictated CT thoracolumbar spine. ABDOMEN / PELVIS: Hepatobiliary: Not enlarged. Nodular hepatic contour. Similar-appearing heterogeneous right hepatic dome with findings better evaluated on MRI abdomen 03/14/2024. No laceration or subcapsular hematoma. The gallbladder is otherwise unremarkable with no radio-opaque gallstones. No biliary ductal dilatation. Pancreas: Normal pancreatic contour. No main pancreatic duct dilatation. Spleen: Not enlarged. No focal lesion. No laceration, subcapsular hematoma, or vascular injury. Adrenals/Urinary Tract: No nodularity bilaterally. 5 mm left nephrolithiasis. No right nephrolithiasis. No ureterolithiasis bilaterally. Bilateral kidneys enhance symmetrically. No hydronephrosis. No contusion, laceration, or subcapsular hematoma. No injury to the vascular structures or collecting systems. No hydroureter. The urinary bladder is unremarkable. On delayed imaging, there is no urothelial wall thickening and there are no filling defects in the opacified portions of the bilateral collecting systems or ureters. Stomach/Bowel: No small or large bowel wall thickening or dilatation. Colonic diverticulosis. The appendix is unremarkable. Vasculature/Lymphatics: No abdominal aorta or iliac aneurysm. No active contrast  extravasation or pseudoaneurysm. No abdominal, pelvic, inguinal lymphadenopathy. Reproductive: Prostate is unremarkable. Other: No simple free fluid ascites. No pneumoperitoneum. No hemoperitoneum. No mesenteric hematoma identified. No organized fluid collection. Musculoskeletal: No significant soft tissue hematoma. No acute pelvic fracture. Please see separately dictated CT thoracolumbar spine. Other ports and devices: None. IMPRESSION: 1. No acute intrathoracic, intra-abdominal, intrapelvic traumatic  injury. 2. Please see separately dictated CT thoracolumbar spine. Other imaging findings of potential clinical significance: 1. No pulmonary embolus. 2. Colonic diverticulosis with no acute diverticulitis. 3. Nonobstructive 5 mm left nephrolithiasis. 4. Cirrhosis with Similar-appearing heterogeneous right hepatic dome with findings better evaluated on MRI abdomen 03/14/2024. Please note that liver protocol enhanced MR liver protocol is the most sensitive tests for the screening detection of hepatocellular carcinoma in the high risk setting of cirrhosis. Electronically Signed   By: Morgane  Naveau M.D.   On: 07/20/2024 12:35   CT Cervical Spine Wo Contrast Result Date: 07/20/2024 CLINICAL DATA:  Neck trauma (Age >= 65y) Small lac and swelling on back of head. EXAM: CT CERVICAL, THORACIC, AND LUMBAR SPINE WITHOUT CONTRAST TECHNIQUE: Multidetector CT imaging of the cervical, thoracic and lumbar spine was performed without intravenous contrast. Multiplanar CT image reconstructions were also generated. RADIATION DOSE REDUCTION: This exam was performed according to the departmental dose-optimization program which includes automated exposure control, adjustment of the mA and/or kV according to patient size and/or use of iterative reconstruction technique. COMPARISON:  None Available. FINDINGS: CT CERVICAL SPINE FINDINGS Alignment: Normal. Skull base and vertebrae: Multilevel moderate degenerative changes of the spine. No associated severe osseous neural foraminal or central canal stenosis. No acute fracture. No aggressive appearing focal osseous lesion or focal pathologic process. Soft tissues and spinal canal: No prevertebral fluid or swelling. No visible canal hematoma. Upper chest: Unremarkable. Other: None. CT THORACIC SPINE FINDINGS Alignment: Normal. Vertebrae: Multilevel mild degenerative changes of the spine. No acute fracture or focal pathologic process. Paraspinal and other soft tissues: Negative. Disc levels:  Maintained. CT LUMBAR SPINE FINDINGS Segmentation: 5 lumbar type vertebrae. Alignment: Mild retrolisthesis of L2 on L3. Grade 1 anterolisthesis of L3 on L4. Mild retrolisthesis of L4 on L5. Dextroscoliosis of the lumbar spine centered at the L2-L3 levels. Vertebrae: Left L5-S1 pseudoarthrosis. Multilevel severe degenerative changes of the spine. No associated severe osseous neural foraminal or central canal stenosis. No acute fracture or focal pathologic process. Paraspinal and other soft tissues: Negative. Disc levels: Maintained. IMPRESSION: No acute displaced fracture or traumatic listhesis of the cervical thoracic, lumbar spine. Electronically Signed   By: Morgane  Naveau M.D.   On: 07/20/2024 12:27   CT L-SPINE NO CHARGE Result Date: 07/20/2024 CLINICAL DATA:  Neck trauma (Age >= 65y) Small lac and swelling on back of head. EXAM: CT CERVICAL, THORACIC, AND LUMBAR SPINE WITHOUT CONTRAST TECHNIQUE: Multidetector CT imaging of the cervical, thoracic and lumbar spine was performed without intravenous contrast. Multiplanar CT image reconstructions were also generated. RADIATION DOSE REDUCTION: This exam was performed according to the departmental dose-optimization program which includes automated exposure control, adjustment of the mA and/or kV according to patient size and/or use of iterative reconstruction technique. COMPARISON:  None Available. FINDINGS: CT CERVICAL SPINE FINDINGS Alignment: Normal. Skull base and vertebrae: Multilevel moderate degenerative changes of the spine. No associated severe osseous neural foraminal or central canal stenosis. No acute fracture. No aggressive appearing focal osseous lesion or focal pathologic process. Soft tissues and spinal canal: No prevertebral fluid or swelling. No visible canal hematoma. Upper chest: Unremarkable. Other: None. CT THORACIC  SPINE FINDINGS Alignment: Normal. Vertebrae: Multilevel mild degenerative changes of the spine. No acute fracture or focal  pathologic process. Paraspinal and other soft tissues: Negative. Disc levels: Maintained. CT LUMBAR SPINE FINDINGS Segmentation: 5 lumbar type vertebrae. Alignment: Mild retrolisthesis of L2 on L3. Grade 1 anterolisthesis of L3 on L4. Mild retrolisthesis of L4 on L5. Dextroscoliosis of the lumbar spine centered at the L2-L3 levels. Vertebrae: Left L5-S1 pseudoarthrosis. Multilevel severe degenerative changes of the spine. No associated severe osseous neural foraminal or central canal stenosis. No acute fracture or focal pathologic process. Paraspinal and other soft tissues: Negative. Disc levels: Maintained. IMPRESSION: No acute displaced fracture or traumatic listhesis of the cervical thoracic, lumbar spine. Electronically Signed   By: Morgane  Naveau M.D.   On: 07/20/2024 12:27   CT T-SPINE NO CHARGE Result Date: 07/20/2024 CLINICAL DATA:  Neck trauma (Age >= 65y) Small lac and swelling on back of head. EXAM: CT CERVICAL, THORACIC, AND LUMBAR SPINE WITHOUT CONTRAST TECHNIQUE: Multidetector CT imaging of the cervical, thoracic and lumbar spine was performed without intravenous contrast. Multiplanar CT image reconstructions were also generated. RADIATION DOSE REDUCTION: This exam was performed according to the departmental dose-optimization program which includes automated exposure control, adjustment of the mA and/or kV according to patient size and/or use of iterative reconstruction technique. COMPARISON:  None Available. FINDINGS: CT CERVICAL SPINE FINDINGS Alignment: Normal. Skull base and vertebrae: Multilevel moderate degenerative changes of the spine. No associated severe osseous neural foraminal or central canal stenosis. No acute fracture. No aggressive appearing focal osseous lesion or focal pathologic process. Soft tissues and spinal canal: No prevertebral fluid or swelling. No visible canal hematoma. Upper chest: Unremarkable. Other: None. CT THORACIC SPINE FINDINGS Alignment: Normal. Vertebrae:  Multilevel mild degenerative changes of the spine. No acute fracture or focal pathologic process. Paraspinal and other soft tissues: Negative. Disc levels: Maintained. CT LUMBAR SPINE FINDINGS Segmentation: 5 lumbar type vertebrae. Alignment: Mild retrolisthesis of L2 on L3. Grade 1 anterolisthesis of L3 on L4. Mild retrolisthesis of L4 on L5. Dextroscoliosis of the lumbar spine centered at the L2-L3 levels. Vertebrae: Left L5-S1 pseudoarthrosis. Multilevel severe degenerative changes of the spine. No associated severe osseous neural foraminal or central canal stenosis. No acute fracture or focal pathologic process. Paraspinal and other soft tissues: Negative. Disc levels: Maintained. IMPRESSION: No acute displaced fracture or traumatic listhesis of the cervical thoracic, lumbar spine. Electronically Signed   By: Morgane  Naveau M.D.   On: 07/20/2024 12:27   CT Head Wo Contrast Result Date: 07/20/2024 CLINICAL DATA:  Brain metastases suspected Head trauma, minor (Age >= 65y) Mental status change, unknown cause EXAM: CT HEAD WITHOUT CONTRAST TECHNIQUE: Contiguous axial images were obtained from the base of the skull through the vertex without intravenous contrast. RADIATION DOSE REDUCTION: This exam was performed according to the departmental dose-optimization program which includes automated exposure control, adjustment of the mA and/or kV according to patient size and/or use of iterative reconstruction technique. COMPARISON:  CT head 10/31/2021 FINDINGS: Brain: No evidence of large-territorial acute infarction. No parenchymal hemorrhage. No mass lesion. No extra-axial collection. No mass effect or midline shift. No hydrocephalus. Basilar cisterns are patent. Vascular: No hyperdense vessel. Atherosclerotic calcifications are present within the cavernous internal carotid arteries. Skull: No acute fracture or focal lesion. Sinuses/Orbits: Paranasal sinuses and mastoid air cells are clear. Bilateral lens  replacement. Otherwise the orbits are unremarkable. Other: None. IMPRESSION: No acute intracranial abnormality. Please note MRI with and without contrast is much more sensitive for the evaluation  of metastases. Electronically Signed   By: Morgane  Naveau M.D.   On: 07/20/2024 12:22   DG Pelvis Portable Result Date: 07/20/2024 CLINICAL DATA:  fall EXAM: PORTABLE PELVIS 1-2 VIEWS COMPARISON:  None Available. FINDINGS: There is no evidence of pelvic fracture or diastasis. No acute displaced fracture or dislocation of either hips. No pelvic bone lesions are seen. Poorly visualized sacrum- limited evaluation due to overlapping osseous structures and overlying soft tissues. Degenerative changes of visualized lower lumbar spine. Metallic densities overlying the right hip likely surgical anchors. IMPRESSION: Negative for acute traumatic injury. Electronically Signed   By: Morgane  Naveau M.D.   On: 07/20/2024 11:21   DG Chest Portable 1 View Result Date: 07/20/2024 CLINICAL DATA:  fall EXAM: PORTABLE CHEST 1 VIEW COMPARISON:  Chest x-ray 10/31/2021, CT chest 03/14/2024 FINDINGS: The heart and mediastinal contours are unchanged. Atherosclerotic plaque. No focal consolidation. No pulmonary edema. No pleural effusion. No pneumothorax. No acute osseous abnormality. IMPRESSION: 1. No active disease. 2.  Aortic Atherosclerosis (ICD10-I70.0). Electronically Signed   By: Morgane  Naveau M.D.   On: 07/20/2024 11:20   DG Hand Complete Left Result Date: 07/20/2024 CLINICAL DATA:  fall EXAM: LEFT HAND - COMPLETE 3+ VIEW COMPARISON:  None Available. FINDINGS: There is no evidence of fracture or dislocation. First digit mild to moderate degenerative changes. Soft tissues are unremarkable. Vascular calcifications. IMPRESSION: No acute displaced fracture or dislocation. Electronically Signed   By: Morgane  Naveau M.D.   On: 07/20/2024 11:19       Discharge Exam: Vitals:   07/21/24 0906 07/21/24 1125  BP: 130/70 135/81   Pulse: 94 84  Resp: 17 19  Temp: 98.6 F (37 C) 98.7 F (37.1 C)  SpO2: 99% 99%    General: Pt is alert, awake, not in acute distress Cardiovascular: RRR, S1/S2 +, no edema Respiratory: CTA bilaterally, no wheezing, no rhonchi, no respiratory distress, no conversational dyspnea  Abdominal: Soft, NT, ND, bowel sounds + Extremities: no edema, no cyanosis Psych: Normal mood and affect, stable judgement and insight     The results of significant diagnostics from this hospitalization (including imaging, microbiology, ancillary and laboratory) are listed below for reference.     Microbiology: Recent Results (from the past 240 hours)  Blood culture (routine x 2)     Status: None (Preliminary result)   Collection Time: 07/20/24 11:23 AM   Specimen: BLOOD  Result Value Ref Range Status   Specimen Description   Final    BLOOD RIGHT ANTECUBITAL Performed at [facility], [address] [place], [place] [address]    Special Requests   Final    BOTTLES DRAWN AEROBIC AND ANAEROBIC Blood Culture adequate volume Performed at [facility], [address], [place], [place] [address]    Culture   Final    NO GROWTH < 24 HOURS Performed at [facility], [address] [place], [place] [address]    Report Status PENDING  Incomplete  Blood culture (routine x 2)     Status: None (Preliminary result)   Collection Time: 07/20/24 11:31 AM   Specimen: BLOOD RIGHT HAND  Result Value Ref Range Status   Specimen Description   Final    BLOOD RIGHT HAND Performed at [facility], [address] [place], [place] [address]    Special Requests   Final    BOTTLES DRAWN AEROBIC AND ANAEROBIC Blood Culture results may not be optimal due to an inadequate volume of blood  received in culture bottles Performed at [facility], [address] [place], [place] [address]    Culture   Final    NO GROWTH < 24 HOURS Performed at [facility], [address], [place], [place] [address]    Report Status PENDING  Incomplete     Labs: BNP (last 3 results) No results for input(s): BNP in the last 8760 hours. Basic Metabolic Panel: Recent Labs  Lab 07/20/24 1058 07/20/24 1105 07/21/24 0502  NA 137 138 137  K 4.1 3.8 4.1  CL 101 100 101  CO2 25  --  27  GLUCOSE 80 81 154*  BUN 19 19 14   CREATININE 0.70 0.70 0.74  CALCIUM 9.3  --  9.2  MG  --   --  1.9   Liver Function Tests: Recent Labs  Lab 07/20/24 1058 07/21/24 0502  AST 44* 38  ALT 31 29  ALKPHOS 60 49  BILITOT 0.8 1.0  PROT 7.4 7.2  ALBUMIN  3.9 3.2*   Recent Labs  Lab 07/20/24 1058  LIPASE 11   Recent Labs  Lab 07/20/24 1214 07/21/24 0502  AMMONIA 19 37*   CBC: Recent Labs  Lab 07/20/24 1058 07/20/24 1105 07/21/24 0502  WBC 6.5  --  4.5  NEUTROABS 5.3  --   --   HGB 12.3* 12.6* 13.0  HCT 38.4* 37.0* 37.8*  MCV 90.4  --  87.1  PLT 100*  --  106*   Cardiac Enzymes: Recent Labs  Lab 07/20/24 1058  CKTOTAL 148   BNP: Invalid input(s): POCBNP CBG: Recent Labs  Lab 07/20/24 2058 07/21/24 0114 07/21/24 0341 07/21/24 0905 07/21/24 1125  GLUCAP 255* 226* 196* 271* 224*   D-Dimer No results for input(s): DDIMER in the last 72 hours. Hgb A1c Recent Labs    07/21/24 0502  HGBA1C 8.6*   Lipid Profile No results for input(s): CHOL, HDL, LDLCALC, TRIG, CHOLHDL, LDLDIRECT in the last 72 hours. Thyroid function studies Recent Labs    07/21/24 0502  TSH 1.246   Anemia work up Recent Labs    07/21/24 0502  VITAMINB12 673  FOLATE >20.0   Urinalysis    Component Value Date/Time   COLORURINE YELLOW 07/20/2024 1221   APPEARANCEUR CLEAR 07/20/2024 1221   LABSPEC 1.023 07/20/2024 1221   PHURINE 7.0 07/20/2024 1221   GLUCOSEU NEGATIVE 07/20/2024 1221   HGBUR NEGATIVE 07/20/2024 1221   BILIRUBINUR NEGATIVE 07/20/2024 1221   KETONESUR NEGATIVE 07/20/2024 1221   PROTEINUR NEGATIVE 07/20/2024  1221   UROBILINOGEN 0.2 05/05/2007 2151   NITRITE NEGATIVE 07/20/2024 1221   LEUKOCYTESUR NEGATIVE 07/20/2024 1221   Sepsis Labs Recent Labs  Lab 07/20/24 1058 07/21/24 0502  WBC 6.5 4.5   Microbiology Recent Results (from the past 240 hours)  Blood culture (routine x 2)     Status: None (Preliminary result)   Collection Time: 07/20/24 11:23 AM   Specimen: BLOOD  Result Value Ref Range Status   Specimen Description   Final    BLOOD RIGHT ANTECUBITAL Performed at [facility], [address] [place], [place] [address]    Special Requests   Final    BOTTLES DRAWN AEROBIC AND ANAEROBIC Blood Culture adequate volume Performed at [facility], [address] [place], [place] [address]    Culture   Final    NO GROWTH < 24 HOURS Performed at [facility], [address], [place], [place] [address]    Report  Status PENDING  Incomplete  Blood culture (routine x 2)     Status: None (Preliminary result)   Collection Time: 07/20/24 11:31 AM   Specimen: BLOOD RIGHT HAND  Result Value Ref Range Status   Specimen Description   Final    BLOOD RIGHT HAND Performed at [facility], [address], [place], [place] [address]    Special Requests   Final    BOTTLES DRAWN AEROBIC AND ANAEROBIC Blood Culture results may not be optimal due to an inadequate volume of blood received in culture bottles Performed at [facility], [address] [place], [place] [address]    Culture   Final    NO GROWTH < 24 HOURS Performed at [facility], [address], [place], [place] [address]    Report Status PENDING  Incomplete     Patient was seen and examined on the day of discharge and was found to be in stable condition. Time coordinating discharge: 40 minutes including assessment and coordination of care, as well as examination of the patient.   SIGNED:  Delon Hoe, DO [facility] 07/21/2024, 12:50 PM

## 2024-07-21 NOTE — Evaluation (Signed)
 Clinical/Bedside Swallow Evaluation Patient Details  Name: Shawn Bailey MRN: [id number] Date of Birth: 1946-09-23  Today's Date: 07/21/2024 Time: SLP Start Time (ACUTE ONLY): 0800 SLP Stop Time (ACUTE ONLY): 0809 SLP Time Calculation (min) (ACUTE ONLY): 9 min  Past Medical History:  Past Medical History:  Diagnosis Date   Depression    Diabetes mellitus without complication (HCC)    Hepatocellular carcinoma (HCC) 03/08/2021   Parasite infestation    Past Surgical History:  Past Surgical History:  Procedure Laterality Date   IR 3D INDEPENDENT WKST  05/25/2022   IR 3D INDEPENDENT WKST  11/09/2023   IR ANGIOGRAM SELECTIVE EACH ADDITIONAL VESSEL  07/01/2021   IR ANGIOGRAM SELECTIVE EACH ADDITIONAL VESSEL  07/22/2021   IR ANGIOGRAM SELECTIVE EACH ADDITIONAL VESSEL  07/22/2021   IR ANGIOGRAM SELECTIVE EACH ADDITIONAL VESSEL  07/22/2021   IR ANGIOGRAM SELECTIVE EACH ADDITIONAL VESSEL  07/22/2021   IR ANGIOGRAM SELECTIVE EACH ADDITIONAL VESSEL  05/25/2022   IR ANGIOGRAM SELECTIVE EACH ADDITIONAL VESSEL  05/25/2022   IR ANGIOGRAM SELECTIVE EACH ADDITIONAL VESSEL  05/25/2022   IR ANGIOGRAM SELECTIVE EACH ADDITIONAL VESSEL  05/25/2022   IR ANGIOGRAM SELECTIVE EACH ADDITIONAL VESSEL  05/25/2022   IR ANGIOGRAM SELECTIVE EACH ADDITIONAL VESSEL  05/25/2022   IR ANGIOGRAM SELECTIVE EACH ADDITIONAL VESSEL  06/15/2022   IR ANGIOGRAM SELECTIVE EACH ADDITIONAL VESSEL  06/15/2022   IR ANGIOGRAM SELECTIVE EACH ADDITIONAL VESSEL  06/15/2022   IR ANGIOGRAM SELECTIVE EACH ADDITIONAL VESSEL  11/09/2023   IR ANGIOGRAM SELECTIVE EACH ADDITIONAL VESSEL  11/09/2023   IR ANGIOGRAM SELECTIVE EACH ADDITIONAL VESSEL  11/22/2023   IR ANGIOGRAM SELECTIVE EACH ADDITIONAL VESSEL  11/22/2023   IR ANGIOGRAM VISCERAL SELECTIVE  07/01/2021   IR ANGIOGRAM VISCERAL SELECTIVE  07/22/2021   IR ANGIOGRAM VISCERAL SELECTIVE  05/25/2022   IR ANGIOGRAM VISCERAL SELECTIVE  06/15/2022   IR ANGIOGRAM VISCERAL SELECTIVE  11/09/2023   IR ANGIOGRAM  VISCERAL SELECTIVE  11/09/2023   IR ANGIOGRAM VISCERAL SELECTIVE  11/22/2023   IR EMBO ARTERIAL NOT HEMORR HEMANG INC GUIDE ROADMAPPING  07/01/2021   IR EMBO TUMOR ORGAN ISCHEMIA INFARCT INC GUIDE ROADMAPPING  07/22/2021   IR EMBO TUMOR ORGAN ISCHEMIA INFARCT INC GUIDE ROADMAPPING  06/15/2022   IR EMBO TUMOR ORGAN ISCHEMIA INFARCT INC GUIDE ROADMAPPING  11/22/2023   IR RADIOLOGIST EVAL & MGMT  06/01/2021   IR RADIOLOGIST EVAL & MGMT  04/26/2022   IR RADIOLOGIST EVAL & MGMT  07/12/2022   IR RADIOLOGIST EVAL & MGMT  10/05/2023   IR US  GUIDE VASC ACCESS RIGHT  07/01/2021   IR US  GUIDE VASC ACCESS RIGHT  07/22/2021   IR US  GUIDE VASC ACCESS RIGHT  05/25/2022   IR US  GUIDE VASC ACCESS RIGHT  06/15/2022   IR US  GUIDE VASC ACCESS RIGHT  11/09/2023   IR US  GUIDE VASC ACCESS RIGHT  11/22/2023   NO PAST SURGERIES     HPI:  Shawn Bailey is a 78 y.o. 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 MRI of head without acute intracranial abnormalities    Assessment / Plan / Recommendation  Clinical Impression  Pt presents with grossly functional swallow. Pt alert upright in chair at bedside, consuming thin liquids and PO snacks comfortably. No overt s/sx of aspiration with any PO. Oral motor exam was unremarkable. He denies hx of dysphagia or acute changes to swallow function. Recommend  continue regular thin liquid diet. No further ST needs identified.  SLP Visit Diagnosis: Dysphagia, unspecified (R13.10)    Aspiration Risk  No limitations    Diet Recommendation   Thin;Age appropriate regular  Medication Administration: Whole meds with liquid    Other  Recommendations Oral Care Recommendations: Oral care BID     Assistance Recommended at Discharge    Functional Status Assessment    Frequency and Duration             Prognosis Prognosis for improved oropharyngeal function: Good      Swallow Study   General Date of Onset: 07/20/24 HPI: Shawn Bailey is a 78 y.o. 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 MRI of head without acute intracranial abnormalities Type of Study: Bedside Swallow Evaluation Previous Swallow Assessment: none on file Diet Prior to this Study: Regular;Thin liquids (Level 0) Temperature Spikes Noted: No Respiratory Status: Room air History of Recent Intubation: No Behavior/Cognition: Alert;Cooperative;Pleasant mood Oral Cavity Assessment: Within Functional Limits Oral Care Completed by SLP: No Oral Cavity - Dentition: Adequate natural dentition;Missing dentition Vision: Functional for self-feeding Self-Feeding Abilities: Able to feed self Patient Positioning: Upright in chair Baseline Vocal Quality: Normal Volitional Cough: Strong Volitional Swallow: Able to elicit    Oral/Motor/Sensory Function Overall Oral Motor/Sensory Function: Within functional limits   Ice Chips Ice chips: Not tested   Thin Liquid Thin Liquid: Within functional limits Presentation: Cup;Straw    Nectar Thick Nectar Thick Liquid: Not tested   Honey Thick Honey Thick Liquid: Not tested   Puree Puree: Within functional limits Presentation: Spoon   Solid     Solid: Within functional limits Presentation: Self Fed      Shawn Bailey H. MA, CCC-SLP Acute Rehabilitation Services   07/21/2024,8:14 AM

## 2024-07-25 LAB — CULTURE, BLOOD (ROUTINE X 2)
Culture: NO GROWTH
Culture: NO GROWTH
Special Requests: ADEQUATE

## 2024-08-05 ENCOUNTER — Other Ambulatory Visit: Payer: Self-pay

## 2024-08-05 DIAGNOSIS — C220 Liver cell carcinoma: Secondary | ICD-10-CM

## 2024-08-06 ENCOUNTER — Inpatient Hospital Stay: Attending: Hematology

## 2024-08-06 ENCOUNTER — Ambulatory Visit (HOSPITAL_COMMUNITY): Admission: RE | Admit: 2024-08-06 | Source: Ambulatory Visit

## 2024-08-10 ENCOUNTER — Ambulatory Visit (HOSPITAL_COMMUNITY)
Admission: RE | Admit: 2024-08-10 | Discharge: 2024-08-10 | Disposition: A | Source: Ambulatory Visit | Attending: Hematology

## 2024-08-10 ENCOUNTER — Other Ambulatory Visit (HOSPITAL_COMMUNITY): Payer: Self-pay | Admitting: Hematology

## 2024-08-10 DIAGNOSIS — R52 Pain, unspecified: Secondary | ICD-10-CM | POA: Insufficient documentation

## 2024-08-10 DIAGNOSIS — C220 Liver cell carcinoma: Secondary | ICD-10-CM | POA: Insufficient documentation

## 2024-08-10 MED ORDER — GADOBUTROL 1 MMOL/ML IV SOLN
6.0000 mL | Freq: Once | INTRAVENOUS | Status: AC | PRN
Start: 2024-08-10 — End: 2024-08-10
  Administered 2024-08-10: 6 mL via INTRAVENOUS

## 2024-08-15 ENCOUNTER — Other Ambulatory Visit: Payer: Self-pay

## 2024-08-22 ENCOUNTER — Inpatient Hospital Stay (HOSPITAL_BASED_OUTPATIENT_CLINIC_OR_DEPARTMENT_OTHER): Admitting: Hematology

## 2024-08-22 ENCOUNTER — Inpatient Hospital Stay: Attending: Hematology

## 2024-08-22 VITALS — BP 140/70 | HR 70 | Temp 98.3°F | Resp 17 | Ht 65.0 in | Wt 133.3 lb

## 2024-08-22 DIAGNOSIS — C220 Liver cell carcinoma: Secondary | ICD-10-CM | POA: Diagnosis not present

## 2024-08-22 LAB — CBC WITH DIFFERENTIAL (CANCER CENTER ONLY)
Abs Immature Granulocytes: 0.01 K/uL (ref 0.00–0.07)
Basophils Absolute: 0 K/uL (ref 0.0–0.1)
Basophils Relative: 0 %
Eosinophils Absolute: 0.1 K/uL (ref 0.0–0.5)
Eosinophils Relative: 3 %
HCT: 34.6 % — ABNORMAL LOW (ref 39.0–52.0)
Hemoglobin: 11.8 g/dL — ABNORMAL LOW (ref 13.0–17.0)
Immature Granulocytes: 0 %
Lymphocytes Relative: 17 %
Lymphs Abs: 0.6 K/uL — ABNORMAL LOW (ref 0.7–4.0)
MCH: 30.1 pg (ref 26.0–34.0)
MCHC: 34.1 g/dL (ref 30.0–36.0)
MCV: 88.3 fL (ref 80.0–100.0)
Monocytes Absolute: 0.3 K/uL (ref 0.1–1.0)
Monocytes Relative: 10 %
Neutro Abs: 2.3 K/uL (ref 1.7–7.7)
Neutrophils Relative %: 70 %
Platelet Count: 90 K/uL — ABNORMAL LOW (ref 150–400)
RBC: 3.92 MIL/uL — ABNORMAL LOW (ref 4.22–5.81)
RDW: 13 % (ref 11.5–15.5)
WBC Count: 3.3 K/uL — ABNORMAL LOW (ref 4.0–10.5)
nRBC: 0 % (ref 0.0–0.2)

## 2024-08-22 LAB — CMP (CANCER CENTER ONLY)
ALT: 26 U/L (ref 0–44)
AST: 35 U/L (ref 15–41)
Albumin: 3.6 g/dL (ref 3.5–5.0)
Alkaline Phosphatase: 53 U/L (ref 38–126)
Anion gap: 3 — ABNORMAL LOW (ref 5–15)
BUN: 20 mg/dL (ref 8–23)
CO2: 28 mmol/L (ref 22–32)
Calcium: 9 mg/dL (ref 8.9–10.3)
Chloride: 101 mmol/L (ref 98–111)
Creatinine: 0.83 mg/dL (ref 0.61–1.24)
GFR, Estimated: >60 mL/min (ref >60)
Glucose, Bld: 409 mg/dL — ABNORMAL HIGH (ref 70–99)
Potassium: 4.2 mmol/L (ref 3.5–5.1)
Sodium: 132 mmol/L — ABNORMAL LOW (ref 135–145)
Total Bilirubin: 0.6 mg/dL (ref 0.0–1.2)
Total Protein: 7.1 g/dL (ref 6.5–8.1)

## 2024-08-22 NOTE — Progress Notes (Signed)
 [facility]   [phone] [phone]   Clinic Follow up Note   Patient Care Team: Norval Kettle, MD as PCP - General (Internal Medicine) Lanny Callander, MD as Consulting Physician (Hematology and Oncology)  Date of Service:  08/22/2024  CHIEF COMPLAINT: f/u of liver cancer   CURRENT THERAPY:  cancer surveillance  Oncology History   Hepatocellular carcinoma (HCC) cT3N0M, stage IIIA    -diagnosed in 02/2021, liver biopsy 03/08/21 confirmed moderately differentiated hepatocellular carcinoma. No nodal or distant metastasis on CT 03/05/21.  --He is not a candidate for liver resection or transplant. Dr. Romero at [facility] did not recommend surgery given the location and extent of disease. -He started atezolizumab  and bevacizumab  on 04/28/21, held after 09/2021 due to newly diagnosed type 1 DM with hyperglycemia which was probably related to immunotherapy. --he underwent segment 8 Y90 on 07/22/21 and 06/15/2022  -he had good response to therapies  -Reviewed his restaging CT 09/15/2022 showed stable treated liver lesion, and a enlarging liver lesion in segment VIII (from 4mm to 8mm, LI-RADS category 4) -I personally reviewed his restaging MRI from last week, which showed a stable treated liver lesion, previously LI-RADS [name] category 4 lesion in right lobe of liver is overall stable in size, no definitive washout and enhancing capsule, it is considered LI RADS 3 now.  No additional new lesion. -Patient has developed significant intermittent pain in the right upper quadrant of abdomen, exam showed enlarged left lobe of liver, mild tenderness. -Restaging liver MRI on March 30, 2023 showed stable disease, no new lesions. -His tumor marker AFP remains to be negative (initially at 1479) -Repeated MRI from October 16, 2023 showed stable post embolization appearance of multiple liver lesions, and a slightly enlargement of in arterially hyperenhancing lesion in segment 4A measuring  1.3 cm.  He was referred back to IR and underwent Y90 on 11/22/2023 - Restaging liver MRI on August 10, 2024 showed stable post treatment change, no evidence of residual or new disease.  Assessment & Plan Hepatocellular carcinoma, stable, under surveillance Hepatocellular carcinoma is well-managed with no evidence of progression or metastasis. Recent MRI shows stability. No pain or discomfort reported. Weight is stable at 133 lbs. - Continue surveillance with MRI every six months - Scheduled follow-up appointment in three months  diabetes mellitus with hypoglycemia, insulin  treated diabetes mellitus with episodes of hypoglycemia, likely due to recent reduction in insulin  requirements following cessation of immunotherapy. Recent hypoglycemic episode led to a fall and hospital visit. Insulin  regimen adjusted by primary care physician. Blood glucose levels fluctuate significantly, with nocturnal hypoglycemia. He is knowledgeable about managing hypoglycemia with dietary adjustments. - Continue follow-up with primary care physician for diabetes management - Consider continuous glucose monitoring for better management of blood glucose levels - Ensure dietary adjustments before exercise to prevent hypoglycemia  Plan - I reviewed his lab and recent liver MRI findings, which showed stable treated liver cancer lesions, no evidence of residual or new disease - I encouraged him to follow-up with his PCP closely to adjust his insulin  level, to avoid hypoglycemia episodes. - Lab and follow-up in 3 months.    SUMMARY OF ONCOLOGIC HISTORY: Oncology History Overview Note  Cancer Staging Hepatocellular carcinoma [facility]) Staging form: Liver, AJCC 8th Edition - Clinical stage from 03/08/2021: Stage IIIA (cT3, cN0, cM0) - Signed by Lanny Callander, MD on 03/11/2021 Stage prefix: Initial diagnosis Histologic grade (G): G2 Histologic grading system: 4 grade system     Hepatocellular carcinoma (HCC)  Initial  Diagnosis   Liver mass, right lobe   03/05/2021 Imaging   CT CAP  IMPRESSION: 1. Right hepatic lobe masses which are suspicious for either metastatic disease or primary malignancy such as hepatocellular carcinoma. These are suboptimally evaluated on this noncontrast exam. Consider multidisciplinary oncology consultation with potential clinical strategies of tissue sampling versus PET to direct sampling. 2. No primary malignancy identified within the chest, abdomen, or pelvis, given limitation of noncontrast exam. 3. Left nephrolithiasis. 4. Coronary artery atherosclerosis. Aortic Atherosclerosis (ICD10-I70.0). 5. Esophageal air fluid level suggests dysmotility or gastroesophageal reflux.   03/06/2021 Imaging   MRI Liver  IMPRESSION: 9.5 cm heterogeneous hypovascular mass in anterior right hepatic lobe, and 1.3 cm hypervascular mass in the posterior right hepatic lobe. Differential diagnosis includes liver metastases and multifocal hepatocellular carcinoma. Suggest correlation with serum AFP level, and possible tissue sampling.   No evidence of abdominal metastatic disease.   03/08/2021 Initial Biopsy   A. LIVER, RIGHT, BIOPSY:  - Hepatocellular carcinoma.   COMMENT:  The hepatocellular carcinoma is moderately differentiated and associated with areas of necrosis.  Immunohistochemistry shows positivity with glypican-3 and weak staining with HepPar 1.  The tumor is negative with arginase 1, alpha-fetoprotein, CDX2, cytokeratin 7, cytokeratin 20, cytokeratin AE1/AE3, PSA, prostein and MOC31.  Polyclonal CEA shows focal canalicular pattern.  The morphology and immunophenotype are consistent with hepatocellular carcinoma.    03/08/2021 Cancer Staging   Staging form: Liver, AJCC 8th Edition - Clinical stage from 03/08/2021: Stage IIIA (cT3, cN0, cM0) - Signed by Lanny Callander, MD on 03/11/2021 Stage prefix: Initial diagnosis Histologic grade (G): G2 Histologic grading system: 4 grade  system   04/28/2021 - 10/13/2021 Chemotherapy   Patient is on Treatment Plan : LUNG Atezolizumab  + Bevacizumab  q21d Maintenance     06/16/2021 Imaging   CT Abdomen  IMPRESSION: Marked interval decrease in size of the large RIGHT hepatic lobe lesion, biopsy-proven hepatocellular carcinoma. No arterial phase enhancement on early phase, vague enhancement on later phase greater than 20 Hounsfield unit enhancement on venous phase. Less pronounced arterial enhancement than on previous imaging with small peripheral foci of enhancement on arterial phase particularly along the margin of the tumor near the dome of the liver (image 12/2) 11 mm focus of enhancement in this area.   Interval response to therapy of a small lesion previously compatible with LI-RADS category 5 lesion in the posterior RIGHT hepatic lobe.   No new hepatic lesion.   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   Signs of nephrolithiasis calculus on the LEFT measuring approximately 7 mm.   Colonic diverticulosis without evidence of acute diverticulitis.   Aortic Atherosclerosis (ICD10-I70.0).   11/02/2021 Imaging   EXAM: CT ABDOMEN AND PELVIS WITH CONTRAST   IMPRESSION: 1. Right hepatic mass has minimally decreased in size. No new focal hepatic masses are seen. 2. New small amount of ascites in the right upper quadrant. 3. Wall thickening of the cecum and ascending colon versus normal under distension. Correlate for nonspecific colitis. 4. Nonobstructing left renal calculus. 5.  Aortic Atherosclerosis (ICD10-I70.0).   09/15/2022 Imaging   IMPRESSION: 1. No significant change in post treatment appearance of hepatic segment VIII, with a well circumscribed, intrinsically T1 hyperintense lesion of the liver dome as well as additional heterogeneous, intrinsic T1 hyperintensity just superiorly. LI-RADS TR, nonviable. 2. Following radioembolization, new background parenchymal hyperenhancement of  hepatic segment VII, with faint rim enhancing, although internally nonenhancing lesions in the posterior liver dome corresponding to previously  arterially hyperenhancing liver lesions. This is consistent with post treatment hyperemia without evidence of residual viable tumor. LI-RADS TR, nonviable. 3. Interval enlargement of an arterially hyperenhancing lesion in the anterior right lobe of the liver, near the margin of hepatic segment VIII, measuring 0.8 x 0.8 cm, previously no greater than 0.4 cm. This does not demonstrate clear evidence of washout or capsular enhancement. LI-RADS category 4 due to threshold growth.      Imaging     09/15/2022 Imaging    IMPRESSION: 1. No significant change in post treatment appearance of hepatic segment VIII, with a well circumscribed, intrinsically T1 hyperintense lesion of the liver dome as well as additional heterogeneous, intrinsic T1 hyperintensity just superiorly. LI-RADS TR, nonviable. 2. Following radioembolization, new background parenchymal hyperenhancement of hepatic segment VII, with faint rim enhancing, although internally nonenhancing lesions in the posterior liver dome corresponding to previously arterially hyperenhancing liver lesions. This is consistent with post treatment hyperemia without evidence of residual viable tumor. LI-RADS TR, nonviable. 3. Interval enlargement of an arterially hyperenhancing lesion in the anterior right lobe of the liver, near the margin of hepatic segment VIII, measuring 0.8 x 0.8 cm, previously no greater than 0.4 cm. This does not demonstrate clear evidence of washout or capsular enhancement. LI-RADS category 4 due to threshold growth.     12/18/2022 Imaging    IMPRESSION: 1. Stable appearance of the previously treated segment VIII lesion. LI-RADS TR, nonviable. 2. Stable appearance of the index lesions in the posterior right hepatic dome. As before, there is early heterogeneous but  relatively diffuse enhancement in the posterior right hepatic dome, likely treatment related. Index lesions documented previously are similar in size and enhancement characteristics without suspicious enhancement characteristics. LI-RADS TR, nonviable. 3. Small lesion showing arterial phase hyperenhancement in the anterior right liver (segment II right) persists. As before there is no definite washout and no enhancing capsule associated with this lesion. Given lack of interval growth, this lesion is downgraded to LI-RADS 3. Close continued follow-up recommended. 4. No new or progressive findings in the abdomen.     03/16/2024 Imaging   MR abdomen with and without contrast  IMPRESSION: 1. Redemonstrated treated appearance of the liver dome, involving segments IVA, VII, and VIII with overlying capsular retraction. A previously seen new arterially hyperenhancing lesion in hepatic segment IVA is no longer internally enhancing, and previously treated lesions in hepatic segment VII and VIII are unchanged in appearance, likewise without residual enhancement. LI-RADS TR, nonviable. 2. No new liver lesions. 3. No evidence of lymphadenopathy or metastatic disease in the abdomen.      03/16/2024 Imaging   CT chest, abdomen, and pelvis with contrast  IMPRESSION: 1. Post treatment appearance of the liver dome, with scattered, heterogeneous areas of arterial enhancement and multiple treated lesions. Please see separately reported same day MR examination of the abdomen for detailed comparison to prior MR and LI-RADS characterization of treated liver lesions. 2. No evidence of lymphadenopathy or extrahepatic metastatic disease in the chest, abdomen, or pelvis. 3. Prostatomegaly. 4. Colonic diverticulosis without evidence of acute diverticulitis.   Aortic Atherosclerosis (ICD10-I70.0).      Discussed the use of AI scribe software for clinical note transcription with the patient, who gave verbal  consent to proceed.  History of Present Illness Mose Colaizzi is a 78 year old male with hepatocellular carcinoma who presents for follow-up. He is accompanied by his neighbor, Miss Massing.  He has been off immunotherapy for hepatocellular carcinoma for a while, with a  recent MRI showing stable disease. He experiences no pain or discomfort and maintains a stable weight of 133 pounds.  In October, he experienced a significant fall at home due to a drastic drop in blood sugar levels, resulting in a head injury. He sustained bruises and cuts to the back of his head, which have healed well. He has diabetes, with blood sugar levels sometimes dropping to as low as 40 mg/dL at night. He uses long-acting insulin , which was recently reduced following the fall. He manages hypoglycemia with orange juice and snacks.  He maintains an active lifestyle, exercising three to four times a week for 45 minutes to an hour, and enjoys working with wood and staying busy around the house.     All other systems were reviewed with the patient and are negative.  MEDICAL HISTORY:  Past Medical History:  Diagnosis Date   Depression    Diabetes mellitus without complication (HCC)    Hepatocellular carcinoma (HCC) 03/08/2021   Parasite infestation     SURGICAL HISTORY: Past Surgical History:  Procedure Laterality Date   IR 3D INDEPENDENT WKST  05/25/2022   IR 3D INDEPENDENT WKST  11/09/2023   IR ANGIOGRAM SELECTIVE EACH ADDITIONAL VESSEL  07/01/2021   IR ANGIOGRAM SELECTIVE EACH ADDITIONAL VESSEL  07/22/2021   IR ANGIOGRAM SELECTIVE EACH ADDITIONAL VESSEL  07/22/2021   IR ANGIOGRAM SELECTIVE EACH ADDITIONAL VESSEL  07/22/2021   IR ANGIOGRAM SELECTIVE EACH ADDITIONAL VESSEL  07/22/2021   IR ANGIOGRAM SELECTIVE EACH ADDITIONAL VESSEL  05/25/2022   IR ANGIOGRAM SELECTIVE EACH ADDITIONAL VESSEL  05/25/2022   IR ANGIOGRAM SELECTIVE EACH ADDITIONAL VESSEL  05/25/2022   IR ANGIOGRAM SELECTIVE EACH ADDITIONAL VESSEL   05/25/2022   IR ANGIOGRAM SELECTIVE EACH ADDITIONAL VESSEL  05/25/2022   IR ANGIOGRAM SELECTIVE EACH ADDITIONAL VESSEL  05/25/2022   IR ANGIOGRAM SELECTIVE EACH ADDITIONAL VESSEL  06/15/2022   IR ANGIOGRAM SELECTIVE EACH ADDITIONAL VESSEL  06/15/2022   IR ANGIOGRAM SELECTIVE EACH ADDITIONAL VESSEL  06/15/2022   IR ANGIOGRAM SELECTIVE EACH ADDITIONAL VESSEL  11/09/2023   IR ANGIOGRAM SELECTIVE EACH ADDITIONAL VESSEL  11/09/2023   IR ANGIOGRAM SELECTIVE EACH ADDITIONAL VESSEL  11/22/2023   IR ANGIOGRAM SELECTIVE EACH ADDITIONAL VESSEL  11/22/2023   IR ANGIOGRAM VISCERAL SELECTIVE  07/01/2021   IR ANGIOGRAM VISCERAL SELECTIVE  07/22/2021   IR ANGIOGRAM VISCERAL SELECTIVE  05/25/2022   IR ANGIOGRAM VISCERAL SELECTIVE  06/15/2022   IR ANGIOGRAM VISCERAL SELECTIVE  11/09/2023   IR ANGIOGRAM VISCERAL SELECTIVE  11/09/2023   IR ANGIOGRAM VISCERAL SELECTIVE  11/22/2023   IR EMBO ARTERIAL NOT HEMORR HEMANG INC GUIDE ROADMAPPING  07/01/2021   IR EMBO TUMOR ORGAN ISCHEMIA INFARCT INC GUIDE ROADMAPPING  07/22/2021   IR EMBO TUMOR ORGAN ISCHEMIA INFARCT INC GUIDE ROADMAPPING  06/15/2022   IR EMBO TUMOR ORGAN ISCHEMIA INFARCT INC GUIDE ROADMAPPING  11/22/2023   IR RADIOLOGIST EVAL & MGMT  06/01/2021   IR RADIOLOGIST EVAL & MGMT  04/26/2022   IR RADIOLOGIST EVAL & MGMT  07/12/2022   IR RADIOLOGIST EVAL & MGMT  10/05/2023   IR US  GUIDE VASC ACCESS RIGHT  07/01/2021   IR US  GUIDE VASC ACCESS RIGHT  07/22/2021   IR US  GUIDE VASC ACCESS RIGHT  05/25/2022   IR US  GUIDE VASC ACCESS RIGHT  06/15/2022   IR US  GUIDE VASC ACCESS RIGHT  11/09/2023   IR US  GUIDE VASC ACCESS RIGHT  11/22/2023   NO PAST SURGERIES      I have reviewed the social  history and family history with the patient and they are unchanged from previous note.  ALLERGIES:  has no known allergies.  MEDICATIONS:  Current Outpatient Medications  Medication Sig Dispense Refill   Ascorbic Acid  (VITAMIN C) 100 MG tablet Take 100 mg by mouth daily.     blood glucose meter kit and  supplies Dispense based on patient and insurance preference. Use up to four times daily as directed. (FOR ICD-10 E10.9, E11.9). 1 each 0   Glucagon (GVOKE HYPOPEN 2-PACK) 1 MG/0.2ML SOAJ Inject 1 mg into the skin as needed for up to 2 doses (Severe low blood sugar). 0.4 mL 0   insulin  glargine (LANTUS) 100 UNIT/ML injection Inject 0.1 mLs (10 Units total) into the skin daily.     insulin  lispro (HUMALOG) 100 UNIT/ML KwikPen Check your blood sugar prior to your meal, three times daily. Then depending on your blood sugar, inject insulin  as follows: Blood Glucose 121 - 150: 1 units.. BG 151 - 200: 2 units.. BG 201 - 250: 3 units.. BG 251 - 300: 5 units.. BG 301 - 350: 7 units.. BG 351 - 400: 9 units. 15 mL 11   Insulin  Pen Needle (PEN NEEDLES 3/16) 31G X 5 MM MISC 1 each by Does not apply route with breakfast, with lunch, and with evening meal. 100 each 3   Multiple Vitamin (MULTIVITAMIN WITH MINERALS) TABS tablet Take 1 tablet by mouth daily.     vitamin B-12 (CYANOCOBALAMIN ) 100 MCG tablet Take 100 mcg by mouth daily.     No current facility-administered medications for this visit.   Facility-Administered Medications Ordered in Other Visits  Medication Dose Route Frequency Provider Last Rate Last Admin   0.9 %  sodium chloride  infusion   Intravenous Continuous Lanny Callander, MD        PHYSICAL EXAMINATION: ECOG PERFORMANCE STATUS: 1 - Symptomatic but completely ambulatory  Vitals:   08/22/24 1109 08/22/24 1114  BP: (!) 166/84 (!) 140/70  Pulse: 70   Resp: 17   Temp: 98.3 F (36.8 C)   SpO2: 100%    Wt Readings from Last 3 Encounters:  08/22/24 133 lb 4.8 oz (60.5 kg)  07/20/24 134 lb (60.8 kg)  06/13/24 134 lb 11.2 oz (61.1 kg)     GENERAL:alert, no distress and comfortable SKIN: skin color, texture, turgor are normal, no rashes or significant lesions EYES: normal, Conjunctiva are pink and non-injected, sclera clear NECK: supple, thyroid normal size, non-tender, without  nodularity LYMPH:  no palpable lymphadenopathy in the cervical, axillary  LUNGS: clear to auscultation and percussion with normal breathing effort HEART: regular rate & rhythm and no murmurs and no lower extremity edema ABDOMEN:abdomen soft, non-tender and normal bowel sounds Musculoskeletal:no cyanosis of digits and no clubbing  NEURO: alert & oriented x 3 with fluent speech, no focal motor/sensory deficits  Physical Exam   LABORATORY DATA:  I have reviewed the data as listed    Latest Ref Rng & Units 08/22/2024   10:45 AM 07/21/2024    5:02 AM 07/20/2024   11:05 AM  CBC  WBC 4.0 - 10.5 K/uL 3.3  4.5    Hemoglobin 13.0 - 17.0 g/dL 88.1  86.9  87.3   Hematocrit 39.0 - 52.0 % 34.6  37.8  37.0   Platelets 150 - 400 K/uL 90  106          Latest Ref Rng & Units 08/22/2024   10:45 AM 07/21/2024    5:02 AM 07/20/2024   11:05 AM  CMP  Glucose 70 - 99 mg/dL 590  845  81   BUN 8 - 23 mg/dL 20  14  19    Creatinine 0.61 - 1.24 mg/dL 9.16  9.25  9.29   Sodium 135 - 145 mmol/L 132  137  138   Potassium 3.5 - 5.1 mmol/L 4.2  4.1  3.8   Chloride 98 - 111 mmol/L 101  101  100   CO2 22 - 32 mmol/L 28  27    Calcium 8.9 - 10.3 mg/dL 9.0  9.2    Total Protein 6.5 - 8.1 g/dL 7.1  7.2    Total Bilirubin 0.0 - 1.2 mg/dL 0.6  1.0    Alkaline Phos 38 - 126 U/L 53  49    AST 15 - 41 U/L 35  38    ALT 0 - 44 U/L 26  29        RADIOGRAPHIC STUDIES: I have personally reviewed the radiological images as listed and agreed with the findings in the report. No results found.    No orders of the defined types were placed in this encounter.  All questions were answered. The patient knows to call the clinic with any problems, questions or concerns. No barriers to learning was detected. The total time spent in the appointment was 25 minutes, including review of chart and various tests results, discussions about plan of care and coordination of care plan     Onita Mattock, MD 08/22/2024

## 2024-08-22 NOTE — Assessment & Plan Note (Addendum)
 cT3N0M, stage IIIA    -diagnosed in 02/2021, liver biopsy 03/08/21 confirmed moderately differentiated hepatocellular carcinoma. No nodal or distant metastasis on CT 03/05/21.  --He is not a candidate for liver resection or transplant. Dr. Romero at [facility] did not recommend surgery given the location and extent of disease. -He started atezolizumab  and bevacizumab  on 04/28/21, held after 09/2021 due to newly diagnosed type 1 DM with hyperglycemia which was probably related to immunotherapy. --he underwent segment 8 Y90 on 07/22/21 and 06/15/2022  -he had good response to therapies  -Reviewed his restaging CT 09/15/2022 showed stable treated liver lesion, and a enlarging liver lesion in segment VIII (from 4mm to 8mm, LI-RADS category 4) -I personally reviewed his restaging MRI from last week, which showed a stable treated liver lesion, previously LI-RADS [name] category 4 lesion in right lobe of liver is overall stable in size, no definitive washout and enhancing capsule, it is considered LI RADS 3 now.  No additional new lesion. -Patient has developed significant intermittent pain in the right upper quadrant of abdomen, exam showed enlarged left lobe of liver, mild tenderness. -Restaging liver MRI on March 30, 2023 showed stable disease, no new lesions. -His tumor marker AFP remains to be negative (initially at 1479) -Repeated MRI from October 16, 2023 showed stable post embolization appearance of multiple liver lesions, and a slightly enlargement of in arterially hyperenhancing lesion in segment 4A measuring 1.3 cm.  He was referred back to IR and underwent Y90 on 11/22/2023 - Restaging liver MRI on August 10, 2024 showed stable post treatment change, no evidence of residual or new disease.

## 2024-08-23 LAB — AFP TUMOR MARKER: AFP, Serum, Tumor Marker: <1.8 ng/mL (ref 0.0–8.4)

## 2024-09-08 ENCOUNTER — Other Ambulatory Visit: Payer: Self-pay

## 2024-09-08 ENCOUNTER — Encounter (HOSPITAL_COMMUNITY): Payer: Self-pay

## 2024-09-08 ENCOUNTER — Emergency Department (HOSPITAL_COMMUNITY)

## 2024-09-08 ENCOUNTER — Emergency Department (HOSPITAL_COMMUNITY)
Admission: EM | Admit: 2024-09-08 | Discharge: 2024-09-08 | Disposition: A | Discharge status: Home / Self Care | Attending: Emergency Medicine | Admitting: Emergency Medicine

## 2024-09-08 DIAGNOSIS — W208XXA Other cause of strike by thrown, projected or falling object, initial encounter: Secondary | ICD-10-CM | POA: Diagnosis not present

## 2024-09-08 DIAGNOSIS — S61209A Unspecified open wound of unspecified finger without damage to nail, initial encounter: Secondary | ICD-10-CM

## 2024-09-08 DIAGNOSIS — Z23 Encounter for immunization: Secondary | ICD-10-CM | POA: Diagnosis not present

## 2024-09-08 DIAGNOSIS — S6992XA Unspecified injury of left wrist, hand and finger(s), initial encounter: Secondary | ICD-10-CM | POA: Diagnosis present

## 2024-09-08 DIAGNOSIS — S61201A Unspecified open wound of left index finger without damage to nail, initial encounter: Secondary | ICD-10-CM | POA: Diagnosis not present

## 2024-09-08 LAB — CBC WITH DIFFERENTIAL/PLATELET
Abs Immature Granulocytes: 0.01 K/uL (ref 0.00–0.07)
Basophils Absolute: 0 K/uL (ref 0.0–0.1)
Basophils Relative: 0 %
Eosinophils Absolute: 0.1 K/uL (ref 0.0–0.5)
Eosinophils Relative: 1 %
HCT: 36.5 % — ABNORMAL LOW (ref 39.0–52.0)
Hemoglobin: 12.3 g/dL — ABNORMAL LOW (ref 13.0–17.0)
Immature Granulocytes: 0 %
Lymphocytes Relative: 15 %
Lymphs Abs: 0.6 K/uL — ABNORMAL LOW (ref 0.7–4.0)
MCH: 30.2 pg (ref 26.0–34.0)
MCHC: 33.7 g/dL (ref 30.0–36.0)
MCV: 89.7 fL (ref 80.0–100.0)
Monocytes Absolute: 0.4 K/uL (ref 0.1–1.0)
Monocytes Relative: 11 %
Neutro Abs: 2.7 K/uL (ref 1.7–7.7)
Neutrophils Relative %: 73 %
Platelets: 102 K/uL — ABNORMAL LOW (ref 150–400)
RBC: 4.07 MIL/uL — ABNORMAL LOW (ref 4.22–5.81)
RDW: 12.3 % (ref 11.5–15.5)
WBC: 3.7 K/uL — ABNORMAL LOW (ref 4.0–10.5)
nRBC: 0 % (ref 0.0–0.2)

## 2024-09-08 LAB — BASIC METABOLIC PANEL WITH GFR
Anion gap: 8 (ref 5–15)
BUN: 15 mg/dL (ref 8–23)
CO2: 26 mmol/L (ref 22–32)
Calcium: 9.3 mg/dL (ref 8.9–10.3)
Chloride: 101 mmol/L (ref 98–111)
Creatinine, Ser: 0.74 mg/dL (ref 0.61–1.24)
GFR, Estimated: >60 mL/min (ref >60)
Glucose, Bld: 131 mg/dL — ABNORMAL HIGH (ref 70–99)
Potassium: 4 mmol/L (ref 3.5–5.1)
Sodium: 135 mmol/L (ref 135–145)

## 2024-09-08 MED ORDER — AMOXICILLIN-POT CLAVULANATE 875-125 MG PO TABS: 1.0000 | ORAL_TABLET | Freq: Two times a day (BID) | ORAL | 0 refills | Status: DC

## 2024-09-08 MED ORDER — BACITRACIN ZINC 500 UNIT/GM EX OINT
TOPICAL_OINTMENT | Freq: Two times a day (BID) | CUTANEOUS | Status: DC
  Administered 2024-09-08: 1 via TOPICAL
  Filled 2024-09-08: qty 0.9

## 2024-09-08 MED ORDER — TETANUS-DIPHTH-ACELL PERTUSSIS 5-2-15.5 LF-MCG/0.5 IM SUSP
0.5000 mL | Freq: Once | INTRAMUSCULAR | Status: AC
  Administered 2024-09-08: 0.5 mL via INTRAMUSCULAR
  Filled 2024-09-08: qty 0.5

## 2024-09-08 MED ORDER — SODIUM CHLORIDE 0.9 % IV SOLN
1.0000 g | Freq: Once | INTRAVENOUS | Status: AC
  Administered 2024-09-08: 1 g via INTRAVENOUS
  Filled 2024-09-08: qty 10

## 2024-09-08 MED ORDER — BACITRACIN ZINC 500 UNIT/GM EX OINT: 1.0000 | TOPICAL_OINTMENT | Freq: Two times a day (BID) | CUTANEOUS | 0 refills | Status: DC

## 2024-09-08 NOTE — ED Provider Notes (Signed)
 [place] EMERGENCY DEPARTMENT AT [facility] Provider Note   CSN: [id number] Arrival date & time: 09/08/24  1128     Patient presents with: Wound Check   Shawn Bailey is a 78 y.o. male.   HPI   78 year old male presents emergency department with left index finger injury/infection.  Couple days ago he was chopping wood, fell and had his finger scratched by an object.  He has been keeping it wrapped but since then he is developed worsening redness and swelling at the base of the left index finger.  Denies any systemic symptoms.  He is right-hand dominant.  Prior to Admission medications   Medication Sig Start Date End Date Taking? Authorizing Provider  Ascorbic Acid  (VITAMIN C) 100 MG tablet Take 100 mg by mouth daily.    [provider]  blood glucose meter kit and supplies Dispense based on patient and insurance preference. Use up to four times daily as directed. (FOR ICD-10 E10.9, E11.9). 11/03/21   Patsy Lenis, MD  Glucagon  (GVOKE HYPOPEN  2-PACK) 1 MG/0.2ML SOAJ Inject 1 mg into the skin as needed for up to 2 doses (Severe low blood sugar). 07/21/24   Rojelio Nest, DO  insulin  glargine (LANTUS ) 100 UNIT/ML injection Inject 0.1 mLs (10 Units total) into the skin daily. 07/22/24   Rojelio Nest, DO  insulin  lispro (HUMALOG ) 100 UNIT/ML KwikPen Check your blood sugar prior to your meal, three times daily. Then depending on your blood sugar, inject insulin  as follows: Blood Glucose 121 - 150: 1 units.. BG 151 - 200: 2 units.. BG 201 - 250: 3 units.. BG 251 - 300: 5 units.. BG 301 - 350: 7 units.. BG 351 - 400: 9 units. 07/21/24   Rojelio Nest, DO  Insulin  Pen Needle (PEN NEEDLES 3/16) 31G X 5 MM MISC 1 each by Does not apply route with breakfast, with lunch, and with evening meal. 11/03/21   Patsy Lenis, MD  Multiple Vitamin (MULTIVITAMIN WITH MINERALS) TABS tablet Take 1 tablet by mouth daily.    [provider]  vitamin B-12 (CYANOCOBALAMIN )  100 MCG tablet Take 100 mcg by mouth daily.    [provider]    Allergies: Patient has no known allergies.    Review of Systems  Constitutional:  Negative for appetite change, chills, fatigue and fever.  Respiratory:  Negative for shortness of breath.   Cardiovascular:  Negative for chest pain.  Musculoskeletal:        + Left index finger injury/infection  Skin:  Negative for rash.  Neurological:  Negative for headaches.    Updated Vital Signs BP (!) 146/91   Pulse 70   Temp 97.9 F (36.6 C) (Oral)   Resp 18   SpO2 100%   Physical Exam Vitals and nursing note reviewed.  Constitutional:      General: He is not in acute distress.    Appearance: Normal appearance.  HENT:     Head: Normocephalic.     Mouth/Throat:     Mouth: Mucous membranes are moist.  Cardiovascular:     Rate and Rhythm: Normal rate.  Pulmonary:     Effort: Pulmonary effort is normal. No respiratory distress.  Musculoskeletal:     Comments: Dorsal left index finger has a central macerated area surrounded by mild edema and erythema at the base.  No swelling/redness extending to the palmar aspect, no tenderness along the flexor sheath, full range of motion of the finger.  Skin:    General: Skin is warm.  Neurological:     Mental Status: He is alert and oriented to person, place, and time. Mental status is at baseline.  Psychiatric:        Mood and Affect: Mood normal.      (all labs ordered are listed, but only abnormal results are displayed) Labs Reviewed  CBC WITH DIFFERENTIAL/PLATELET  BASIC METABOLIC PANEL WITH GFR    EKG: None  Radiology: DG Finger Index Left Result Date: 09/08/2024 CLINICAL DATA:  Infection. EXAM: LEFT INDEX FINGER 2+V COMPARISON:  Left hand radiograph dated 07/20/2024. FINDINGS: No acute fracture or dislocation. 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 Mild arthritic changes of the interphalangeal joints. There is diffuse soft tissue swelling. Focal area of skin thickening  and probable ulceration of the proximal index finger. No radiopaque foreign body or soft tissue gas. IMPRESSION: 1. No acute fracture or dislocation. 2. Diffuse soft tissue swelling. Electronically Signed   By: Vanetta Chou M.D.   On: 09/08/2024 13:38     Procedures   Medications Ordered in the ED - No data to display                                  Medical Decision Making Amount and/or Complexity of Data Reviewed Labs: ordered. Radiology: ordered.  Risk OTC drugs. Prescription drug management.   78 year old male presents emergency department injury to the left index finger that has now become infected.  Does not know when his last tetanus shot was.  Denies any fever or systemic symptoms.  The left finger is erythematous, slightly swollen, no flexor tenosynovitis symptoms.  Afebrile.  X-ray is negative.  Blood work is reassuring.  He received his first dose of antibiotics of the IV.  Patient is motivated in regards to outpatient care.  We will plan for outpatient oral antibiotics and close follow-up.  Discussed in length strict return to ED precautions.  Patient at this time appears safe and stable for discharge and close outpatient follow up. Discharge plan and strict return to ED precautions discussed, patient verbalizes understanding and agreement.     Final diagnoses:  None    ED Discharge Orders     None          Bari Roxie HERO, DO 09/08/24 1538

## 2024-09-08 NOTE — ED Triage Notes (Signed)
 Pt arrives via POV. Pt reports several days ago he was chopping wood and accidentally fell on some chestnuts that still had the pointy shell intact. He obtained a wound to his left index finger that he is concerned is now infected. Pt is a diabetic. Reports some drainage earlier. Pt is AxOx4.

## 2024-09-08 NOTE — Discharge Instructions (Addendum)
 You have been seen and discharged from the emergency department.  You have an infection of the left index finger.  Take antibiotic as directed.  You were given your first antibiotic dose here in the department via IV.  You may start your first oral dose tonight.  Keep the wound covered with bacitracin  ointment and wrapping for the next couple days.  Follow-up with your primary provider for further evaluation and further care. Take home medications as prescribed. If you have any worsening symptoms, worsening swelling/redness of the hand, redness tracking up the arm, fever or further concerns for your health please return to an emergency department for further evaluation.

## 2024-09-30 ENCOUNTER — Emergency Department (HOSPITAL_COMMUNITY)

## 2024-09-30 ENCOUNTER — Other Ambulatory Visit: Payer: Self-pay

## 2024-09-30 ENCOUNTER — Observation Stay (HOSPITAL_COMMUNITY)
Admission: EM | Admit: 2024-09-30 | Discharge: 2024-10-02 | Disposition: A | Source: Home / Self Care | Attending: Internal Medicine | Admitting: Internal Medicine

## 2024-09-30 DIAGNOSIS — D696 Thrombocytopenia, unspecified: Secondary | ICD-10-CM | POA: Diagnosis present

## 2024-09-30 DIAGNOSIS — Z79899 Other long term (current) drug therapy: Secondary | ICD-10-CM

## 2024-09-30 DIAGNOSIS — E11649 Type 2 diabetes mellitus with hypoglycemia without coma: Secondary | ICD-10-CM | POA: Diagnosis present

## 2024-09-30 DIAGNOSIS — E1165 Type 2 diabetes mellitus with hyperglycemia: Secondary | ICD-10-CM | POA: Diagnosis not present

## 2024-09-30 DIAGNOSIS — E872 Acidosis, unspecified: Secondary | ICD-10-CM | POA: Diagnosis present

## 2024-09-30 DIAGNOSIS — X58XXXA Exposure to other specified factors, initial encounter: Secondary | ICD-10-CM | POA: Diagnosis present

## 2024-09-30 DIAGNOSIS — Z8505 Personal history of malignant neoplasm of liver: Secondary | ICD-10-CM

## 2024-09-30 DIAGNOSIS — E162 Hypoglycemia, unspecified: Principal | ICD-10-CM | POA: Diagnosis present

## 2024-09-30 DIAGNOSIS — T383X1A Poisoning by insulin and oral hypoglycemic [antidiabetic] drugs, accidental (unintentional), initial encounter: Secondary | ICD-10-CM

## 2024-09-30 DIAGNOSIS — R400 Somnolence: Secondary | ICD-10-CM

## 2024-09-30 DIAGNOSIS — K769 Liver disease, unspecified: Secondary | ICD-10-CM | POA: Diagnosis present

## 2024-09-30 DIAGNOSIS — Z794 Long term (current) use of insulin: Secondary | ICD-10-CM

## 2024-09-30 DIAGNOSIS — Z833 Family history of diabetes mellitus: Secondary | ICD-10-CM

## 2024-09-30 DIAGNOSIS — I10 Essential (primary) hypertension: Secondary | ICD-10-CM | POA: Diagnosis present

## 2024-09-30 LAB — CBC WITH DIFFERENTIAL/PLATELET
Abs Immature Granulocytes: 0.03 K/uL (ref 0.00–0.07)
Basophils Absolute: 0 K/uL (ref 0.0–0.1)
Basophils Relative: 0 %
Eosinophils Absolute: 0 K/uL (ref 0.0–0.5)
Eosinophils Relative: 0 %
HCT: 39 % (ref 39.0–52.0)
Hemoglobin: 13.3 g/dL (ref 13.0–17.0)
Immature Granulocytes: 0 %
Lymphocytes Relative: 6 %
Lymphs Abs: 0.4 K/uL — ABNORMAL LOW (ref 0.7–4.0)
MCH: 30 pg (ref 26.0–34.0)
MCHC: 34.1 g/dL (ref 30.0–36.0)
MCV: 88 fL (ref 80.0–100.0)
Monocytes Absolute: 0.5 K/uL (ref 0.1–1.0)
Monocytes Relative: 8 %
Neutro Abs: 6.1 K/uL (ref 1.7–7.7)
Neutrophils Relative %: 86 %
Platelets: 117 K/uL — ABNORMAL LOW (ref 150–400)
RBC: 4.43 MIL/uL (ref 4.22–5.81)
RDW: 12.7 % (ref 11.5–15.5)
WBC: 7.1 K/uL (ref 4.0–10.5)
nRBC: 0 % (ref 0.0–0.2)

## 2024-09-30 LAB — CBG MONITORING, ED
Glucose-Capillary: 121 mg/dL — ABNORMAL HIGH (ref 70–99)
Glucose-Capillary: 49 mg/dL — ABNORMAL LOW (ref 70–99)
Glucose-Capillary: 242 mg/dL — ABNORMAL HIGH (ref 70–99)
Glucose-Capillary: 346 mg/dL — ABNORMAL HIGH (ref 70–99)

## 2024-09-30 LAB — BLOOD GAS, VENOUS
Acid-Base Excess: 0.5 mmol/L (ref 0.0–2.0)
Bicarbonate: 22.9 mmol/L (ref 20.0–28.0)
O2 Saturation: 99.1 %
Patient temperature: 37
pCO2, Ven: 30 mmHg — ABNORMAL LOW (ref 44–60)
pH, Ven: 7.49 — ABNORMAL HIGH (ref 7.25–7.43)
pO2, Ven: 94 mmHg — ABNORMAL HIGH (ref 32–45)

## 2024-09-30 LAB — I-STAT CG4 LACTIC ACID, ED: Lactic Acid, Venous: 1.5 mmol/L (ref 0.5–1.9)

## 2024-09-30 LAB — COMPREHENSIVE METABOLIC PANEL WITH GFR
ALT: 39 U/L (ref 0–44)
AST: 61 U/L — ABNORMAL HIGH (ref 15–41)
Albumin: 3.9 g/dL (ref 3.5–5.0)
Alkaline Phosphatase: 55 U/L (ref 38–126)
Anion gap: 9 (ref 5–15)
BUN: 16 mg/dL (ref 8–23)
CO2: 25 mmol/L (ref 22–32)
Calcium: 9 mg/dL (ref 8.9–10.3)
Chloride: 102 mmol/L (ref 98–111)
Creatinine, Ser: 0.59 mg/dL — ABNORMAL LOW (ref 0.61–1.24)
GFR, Estimated: >60 mL/min (ref >60)
Glucose, Bld: 50 mg/dL — ABNORMAL LOW (ref 70–99)
Potassium: 3.7 mmol/L (ref 3.5–5.1)
Sodium: 136 mmol/L (ref 135–145)
Total Bilirubin: 0.7 mg/dL (ref 0.0–1.2)
Total Protein: 7.5 g/dL (ref 6.5–8.1)

## 2024-09-30 LAB — URINALYSIS, W/ REFLEX TO CULTURE (INFECTION SUSPECTED)
Bacteria, UA: NONE SEEN
Bilirubin Urine: NEGATIVE
Glucose, UA: 150 mg/dL — AB
Hgb urine dipstick: NEGATIVE
Ketones, ur: 5 mg/dL — AB
Leukocytes,Ua: NEGATIVE
Nitrite: NEGATIVE
Protein, ur: NEGATIVE mg/dL
Specific Gravity, Urine: 1.019 (ref 1.005–1.030)
pH: 7 (ref 5.0–8.0)

## 2024-09-30 LAB — LIPASE, BLOOD: Lipase: 16 U/L (ref 11–51)

## 2024-09-30 LAB — PROTIME-INR
INR: 1 (ref 0.8–1.2)
Prothrombin Time: 14 s (ref 11.4–15.2)

## 2024-09-30 LAB — URINE DRUG SCREEN
Amphetamines: NEGATIVE
Barbiturates: NEGATIVE
Benzodiazepines: NEGATIVE
Cocaine: NEGATIVE
Fentanyl: NEGATIVE
Methadone Scn, Ur: NEGATIVE
Opiates: NEGATIVE
Tetrahydrocannabinol: POSITIVE — AB

## 2024-09-30 LAB — ETHANOL: Alcohol, Ethyl (B): <15 mg/dL (ref <15)

## 2024-09-30 LAB — AMMONIA: Ammonia: 34 umol/L (ref 9–35)

## 2024-09-30 LAB — TSH: TSH: 1.15 u[IU]/mL (ref 0.350–4.500)

## 2024-09-30 MED ORDER — ONDANSETRON HCL 4 MG PO TABS: 4.0000 mg | ORAL_TABLET | Freq: Four times a day (QID) | ORAL | Status: DC | PRN

## 2024-09-30 MED ORDER — ACETAMINOPHEN 325 MG PO TABS
650.0000 mg | ORAL_TABLET | Freq: Four times a day (QID) | ORAL | Status: DC | PRN
  Administered 2024-10-01: 21:00:00 650 mg via ORAL
  Filled 2024-09-30: qty 2

## 2024-09-30 MED ORDER — ONDANSETRON HCL 4 MG/2ML IJ SOLN: 4.0000 mg | Freq: Four times a day (QID) | INTRAMUSCULAR | Status: DC | PRN

## 2024-09-30 MED ORDER — ENOXAPARIN SODIUM 40 MG/0.4ML IJ SOSY
40.0000 mg | PREFILLED_SYRINGE | INTRAMUSCULAR | Status: DC
  Administered 2024-10-01 – 2024-10-02 (×2): 40 mg via SUBCUTANEOUS
  Filled 2024-09-30 (×2): qty 0.4

## 2024-09-30 MED ORDER — DEXTROSE 10 % IV SOLN: INTRAVENOUS | Status: DC

## 2024-09-30 MED ORDER — HYDRALAZINE HCL 20 MG/ML IJ SOLN: 5.0000 mg | INTRAMUSCULAR | Status: DC | PRN

## 2024-09-30 MED ORDER — ACETAMINOPHEN 650 MG RE SUPP: 650.0000 mg | Freq: Four times a day (QID) | RECTAL | Status: DC | PRN

## 2024-09-30 NOTE — ED Provider Notes (Signed)
 Case discussed with Dr Trixie regarding admission   Randol Simmonds, MD 09/30/24 [phone]

## 2024-09-30 NOTE — ED Notes (Signed)
Pt provided with a turkey sandwich and apple juice.

## 2024-09-30 NOTE — ED Provider Notes (Signed)
 [place] EMERGENCY DEPARTMENT AT [facility] Provider Note   CSN: [id number] Arrival date & time: 09/30/24  1348     Patient presents with: Hypoglycemia   Shawn Bailey is a 78 y.o. male.   The history is provided by the patient, the spouse, a relative and medical records. No language interpreter was used.  Hypoglycemia Initial blood sugar:  40 Blood sugar after intervention:  213 Severity:  Moderate Onset quality:  Unable to specify Duration:  4 hours Timing:  Constant Progression:  Unchanged Chronicity:  New Diabetic status:  Controlled with insulin  Context comment:  Possible doubling of insulijn Relieved by:  IV glucose Associated symptoms: altered mental status, decreased responsiveness and tremors (per family)   Associated symptoms: no dizziness, no seizures, no shortness of breath, no sweats, no visual change, no vomiting and no weakness   Risk factors: cancer        Prior to Admission medications  Medication Sig Start Date End Date Taking? Authorizing Provider  amoxicillin -clavulanate (AUGMENTIN ) 875-125 MG tablet Take 1 tablet by mouth every 12 (twelve) hours. 09/08/24   Horton, Roxie HERO, DO  Ascorbic Acid  (VITAMIN C) 100 MG tablet Take 100 mg by mouth daily.    [provider]  bacitracin  ointment Apply 1 Application topically 2 (two) times daily. 09/08/24   Horton, Kristie M, DO  blood glucose meter kit and supplies Dispense based on patient and insurance preference. Use up to four times daily as directed. (FOR ICD-10 E10.9, E11.9). 11/03/21   Patsy Lenis, MD  Glucagon  (GVOKE HYPOPEN  2-PACK) 1 MG/0.2ML SOAJ Inject 1 mg into the skin as needed for up to 2 doses (Severe low blood sugar). 07/21/24   Rojelio Nest, DO  insulin  glargine (LANTUS ) 100 UNIT/ML injection Inject 0.1 mLs (10 Units total) into the skin daily. 07/22/24   Rojelio Nest, DO  insulin  lispro (HUMALOG ) 100 UNIT/ML KwikPen Check your blood sugar prior to your meal,  three times daily. Then depending on your blood sugar, inject insulin  as follows: Blood Glucose 121 - 150: 1 units.. BG 151 - 200: 2 units.. BG 201 - 250: 3 units.. BG 251 - 300: 5 units.. BG 301 - 350: 7 units.. BG 351 - 400: 9 units. 07/21/24   Rojelio Nest, DO  Insulin  Pen Needle (PEN NEEDLES 3/16) 31G X 5 MM MISC 1 each by Does not apply route with breakfast, with lunch, and with evening meal. 11/03/21   Patsy Lenis, MD  Multiple Vitamin (MULTIVITAMIN WITH MINERALS) TABS tablet Take 1 tablet by mouth daily.    [provider]  vitamin B-12 (CYANOCOBALAMIN ) 100 MCG tablet Take 100 mcg by mouth daily.    [provider]    Allergies: Patient has no known allergies.    Review of Systems  Constitutional:  Positive for decreased responsiveness. Negative for chills, diaphoresis, fatigue and fever.  HENT:  Negative for congestion.   Eyes:  Negative for visual disturbance.  Respiratory:  Negative for cough, chest tightness, shortness of breath and wheezing.   Cardiovascular:  Negative for chest pain and palpitations.  Gastrointestinal:  Negative for abdominal pain, constipation, diarrhea, nausea and vomiting.  Genitourinary:  Negative for dysuria, flank pain and frequency.  Musculoskeletal:  Negative for back pain and neck pain.  Skin:  Negative for rash and wound.  Neurological:  Positive for tremors (per family) and light-headedness. Negative for dizziness, seizures, weakness and headaches.  All other systems reviewed and are negative.   Updated Vital Signs BP (!) 146/83 (  BP Location: Right Arm)   Pulse 64   Resp 15   SpO2 98%   Physical Exam Vitals and nursing note reviewed.  Constitutional:      General: He is not in acute distress.    Appearance: He is well-developed. He is not ill-appearing, toxic-appearing or diaphoretic.  HENT:     Head: Normocephalic and atraumatic.     Nose: No congestion or rhinorrhea.     Mouth/Throat:     Mouth: Mucous membranes are  moist.     Pharynx: No oropharyngeal exudate or posterior oropharyngeal erythema.  Eyes:     Extraocular Movements: Extraocular movements intact.     Conjunctiva/sclera: Conjunctivae normal.     Pupils: Pupils are equal, round, and reactive to light.  Cardiovascular:     Rate and Rhythm: Normal rate and regular rhythm.     Pulses: Normal pulses.     Heart sounds: No murmur heard. Pulmonary:     Effort: Pulmonary effort is normal. No respiratory distress.     Breath sounds: Normal breath sounds. No wheezing, rhonchi or rales.  Chest:     Chest wall: No tenderness.  Abdominal:     General: Abdomen is flat.     Palpations: Abdomen is soft.     Tenderness: There is no abdominal tenderness. There is no right CVA tenderness, left CVA tenderness, guarding or rebound.  Musculoskeletal:        General: No swelling or tenderness.     Cervical back: Neck supple.  Skin:    General: Skin is warm and dry.     Capillary Refill: Capillary refill takes less than 2 seconds.     Findings: No erythema.  Neurological:     General: No focal deficit present.     Sensory: No sensory deficit.     Motor: No weakness or abnormal muscle tone.     Comments: Somnolent when I was not loudly speaking to patient.      (all labs ordered are listed, but only abnormal results are displayed) Labs Reviewed  CBC WITH DIFFERENTIAL/PLATELET - Abnormal; Notable for the following components:      Result Value   Platelets 117 (*)    Lymphs Abs 0.4 (*)    All other components within normal limits  COMPREHENSIVE METABOLIC PANEL WITH GFR - Abnormal; Notable for the following components:   Glucose, Bld 50 (*)    Creatinine, Ser 0.59 (*)    AST 61 (*)    All other components within normal limits  BLOOD GAS, VENOUS - Abnormal; Notable for the following components:   pH, Ven 7.49 (*)    pCO2, Ven 30 (*)    pO2, Ven 94 (*)    All other components within normal limits  CBG MONITORING, ED - Abnormal; Notable for the  following components:   Glucose-Capillary 121 (*)    All other components within normal limits  CBG MONITORING, ED - Abnormal; Notable for the following components:   Glucose-Capillary 49 (*)    All other components within normal limits  LIPASE, BLOOD  AMMONIA  PROTIME-INR  ETHANOL  TSH  URINALYSIS, W/ REFLEX TO CULTURE (INFECTION SUSPECTED)  URINE DRUG SCREEN  I-STAT CG4 LACTIC ACID, ED  CBG MONITORING, ED  I-STAT CG4 LACTIC ACID, ED    EKG: EKG Interpretation Date/Time:  Monday September 30 2024 15:43:42 EST Ventricular Rate:  80 PR Interval:  158 QRS Duration:  107 QT Interval:  432 QTC Calculation: 499 R Axis:   -77  Text Interpretation: Sinus rhythm Left anterior fascicular block Abnormal R-wave progression, early transition Borderline T abnormalities, inferior leads Borderline prolonged QT interval when compared to prior, similar appearance NO STEMI Confirmed by Ginger Barefoot ([id number]) on 09/30/2024 3:47:03 PM  Radiology: DG Chest Portable 1 View Result Date: 09/30/2024 CLINICAL DATA:  Altered mental status. EXAM: PORTABLE CHEST 1 VIEW COMPARISON:  Chest radiograph dated 07/20/2024. FINDINGS: No focal consolidation, pleural effusion or pneumothorax. The cardiac silhouette is within normal limits. No acute osseous pathology. IMPRESSION: No active disease. Electronically Signed   By: Vanetta Chou M.D.   On: 09/30/2024 15:56     Procedures   CRITICAL CARE Performed by: Lonni PARAS Oluwateniola Leitch Total critical care time: 25 minutes Critical care time was exclusive of separately billable procedures and treating other patients. Critical care was necessary to treat or prevent imminent or life-threatening deterioration. Critical care was time spent personally by me on the following activities: development of treatment plan with patient and/or surrogate as well as nursing, discussions with consultants, evaluation of patient's response to treatment, examination of patient,  obtaining history from patient or surrogate, ordering and performing treatments and interventions, ordering and review of laboratory studies, ordering and review of radiographic studies, pulse oximetry and re-evaluation of patient's condition.  Medications Ordered in the ED  dextrose  10 % infusion (has no administration in time range)                                    Medical Decision Making Amount and/or Complexity of Data Reviewed Labs: ordered. Radiology: ordered.  Risk Prescription drug management.    Suleyman Reister is a 78 y.o. male with a past medical history significant for hepatocellular carcinoma, previous HHS, previous metabolic encephalopathy, and previous hyperglycemia who presents with altered mental status and hypoglycemia.  According to EMS report to nursing, patient was found unresponsive at home with a CBG of 40.  It improved after D10.  Patient is somnolent on arrival but is able to wake up and answer questions.  Family said that they would like to interpret but patient speaks English and they do as well and do not want interpreter.  Family reports that the patient was last normal at 8:30 AM this morning when he was eating breakfast with the ex-wife.  He reportedly went home to go make quesadillas and he had taken his injectable long-acting insulin  after breakfast.  After several hours he was not answering phone calls and they went to check on him and he was sitting in his couch unresponsive with sonorous respirations.  They do report he takes pain medicine for chronic pain but otherwise they did not think he has overdosed on anything.  There was no reported trauma although he reportedly has had several falls over the last.  No recent injury.  Otherwise this morning at 8:30 AM he was having no fevers, chills, congestion, cough, nausea, vomiting, vision, diarrhea, or urinary changes.  On my evaluation I was able to yell loudly and patient woke up to answer questions.   They report this is the most he has responded since they found him.  Patient says that he took 35 units of his insulin  at home as well which means he may have taken double the insulin  than he should.  Patient arouses to loud voice and tells me he has no complaints.  He does feel tired but denies any headache, neck pain, chest pain,  abdominal pain, back pain, flank pain or pain in extremities.  He denies any nausea, vomiting, constipation, diarrhea, or urinary changes.  He just feels tired.  On further exam his lungs are clear.  Chest nontender.  Abdomen nontender.  Pupils were symmetric and reactive and they were not pinpoint.  He had intact sensation and strength in extremities.  Symmetric smile.  Clear speech.  Patient otherwise well-appearing.  Vital signs reassuring on my eval however he has not had a temperature yet so we will check that.  Clinically I suspect this was a accidental doubling of his insulin  leading to hypoglycemia however we will check his glucose again and check other labs.  With his vascular carcinoma we will also make sure he does not have hepatic encephalopathy the left at this possible insulin  overdose.  We will look for occult infection as well given the ulcer mental status and will get chest x-ray and urinalysis.  Will get other labs.  Will get a CT of the head given these reported falls and the altered mental status.  Anticipate reassessment after workup to determine disposition and if he is still not at his baseline or gets recurrently hypoglycemic, he may require admission.      4:58 PM Workup returned again and patient was hypoglycemic in the 40s.  He will get IV glucose and will be admitted for recurrent fatigue and hypoglycemia in the setting of likely accidental long-acting insulin  overdose.  The rices workup was returning fairly reassuring with reassuring CBC.  Creatinine was not elevated and his metabolic panel did show the hyperglycemia again.  Lipase not elevated.   TSH normal.   EtOH undetectable and ammonia is not elevated.  Chest x-ray did not show pneumonia and CT head is in process.  Patient will be admitted for recurrent hypoglycemia likely from accidental insulin  overdose.     Final diagnoses:  Hypoglycemia  Somnolence  Accidental overdose of insulin      Clinical Impression: 1. Hypoglycemia   2. Somnolence   3. Accidental overdose of insulin      Disposition: Admit  This note was prepared with assistance of Dragon voice recognition software. Occasional wrong-word or sound-a-like substitutions may have occurred due to the inherent limitations of voice recognition software.      Kristopher Delk, Lonni PARAS, MD 09/30/24 1700

## 2024-09-30 NOTE — H&P (Signed)
 History and Physical    Rhone Ozaki FMW:[id number] DOB: 1946/01/27 DOA: 09/30/2024  I have briefly reviewed the patient's prior medical records in [facility]  PCP: Norval Kettle, MD  Patient coming from: home  Chief Complaint: sleepiness, hypoglycemia  HPI: Torri Langston is a 78 y.o. male with medical history significant of insulin -dependent diabetes mellitus, HCC status post immunotherapy and embolization, currently under observation who comes into the hospital with increased sleepiness and hypoglycemia.  On my evaluation patient seems to be somewhat sleepy but wakes up intermittently and can somewhat participate in interview.  He tells me he is not clear as to exactly what happened, he remembers taking 35 units of insulin  but tells me he has not been eating that so he became hypoglycemic.  Based on his medication list he is only supposed to take 10 units.  There was concerns from family when they were present in the ED and discussed with the EDP that he may have taken his home insulin  twice by accident.  Patient tells me that he is not sure if it has happened or not, and also tells me that he has not been eating well today because he forgot about eating.  When EMS was called he was found to be hypoglycemic with a glucose in the 40s, improved after D10, and he was brought to the emergency room.  He currently feels a little bit sleepy but denies any fever, chills, no abdominal pain, no nausea or vomiting.  He has no chest discomfort or shortness of breath.  ED Course: In the emergency room he is afebrile, initial blood pressure was normal but then found to be elevated into the 170-180 systolic.  He is satting well on room air.  Blood work reveals a glucose of 50 on regular labs.  He was given dextrose  with improvement, but then dipped again at 49, he was placed on a dextrose  infusion and then we were asked to admit.  Review of Systems: All systems reviewed, and apart from HPI, all  negative  Past Medical History:  Diagnosis Date   Depression    Diabetes mellitus without complication (HCC)    Hepatocellular carcinoma (HCC) 03/08/2021   Parasite infestation     Past Surgical History:  Procedure Laterality Date   IR 3D INDEPENDENT WKST  05/25/2022   IR 3D INDEPENDENT WKST  11/09/2023   IR ANGIOGRAM SELECTIVE EACH ADDITIONAL VESSEL  07/01/2021   IR ANGIOGRAM SELECTIVE EACH ADDITIONAL VESSEL  07/22/2021   IR ANGIOGRAM SELECTIVE EACH ADDITIONAL VESSEL  07/22/2021   IR ANGIOGRAM SELECTIVE EACH ADDITIONAL VESSEL  07/22/2021   IR ANGIOGRAM SELECTIVE EACH ADDITIONAL VESSEL  07/22/2021   IR ANGIOGRAM SELECTIVE EACH ADDITIONAL VESSEL  05/25/2022   IR ANGIOGRAM SELECTIVE EACH ADDITIONAL VESSEL  05/25/2022   IR ANGIOGRAM SELECTIVE EACH ADDITIONAL VESSEL  05/25/2022   IR ANGIOGRAM SELECTIVE EACH ADDITIONAL VESSEL  05/25/2022   IR ANGIOGRAM SELECTIVE EACH ADDITIONAL VESSEL  05/25/2022   IR ANGIOGRAM SELECTIVE EACH ADDITIONAL VESSEL  05/25/2022   IR ANGIOGRAM SELECTIVE EACH ADDITIONAL VESSEL  06/15/2022   IR ANGIOGRAM SELECTIVE EACH ADDITIONAL VESSEL  06/15/2022   IR ANGIOGRAM SELECTIVE EACH ADDITIONAL VESSEL  06/15/2022   IR ANGIOGRAM SELECTIVE EACH ADDITIONAL VESSEL  11/09/2023   IR ANGIOGRAM SELECTIVE EACH ADDITIONAL VESSEL  11/09/2023   IR ANGIOGRAM SELECTIVE EACH ADDITIONAL VESSEL  11/22/2023   IR ANGIOGRAM SELECTIVE EACH ADDITIONAL VESSEL  11/22/2023   IR ANGIOGRAM VISCERAL SELECTIVE  07/01/2021   IR ANGIOGRAM VISCERAL SELECTIVE  07/22/2021   IR ANGIOGRAM VISCERAL SELECTIVE  05/25/2022   IR ANGIOGRAM VISCERAL SELECTIVE  06/15/2022   IR ANGIOGRAM VISCERAL SELECTIVE  11/09/2023   IR ANGIOGRAM VISCERAL SELECTIVE  11/09/2023   IR ANGIOGRAM VISCERAL SELECTIVE  11/22/2023   IR EMBO ARTERIAL NOT HEMORR HEMANG INC GUIDE ROADMAPPING  07/01/2021   IR EMBO TUMOR ORGAN ISCHEMIA INFARCT INC GUIDE ROADMAPPING  07/22/2021   IR EMBO TUMOR ORGAN ISCHEMIA INFARCT INC GUIDE ROADMAPPING  06/15/2022   IR EMBO TUMOR ORGAN  ISCHEMIA INFARCT INC GUIDE ROADMAPPING  11/22/2023   IR RADIOLOGIST EVAL & MGMT  06/01/2021   IR RADIOLOGIST EVAL & MGMT  04/26/2022   IR RADIOLOGIST EVAL & MGMT  07/12/2022   IR RADIOLOGIST EVAL & MGMT  10/05/2023   IR US  GUIDE VASC ACCESS RIGHT  07/01/2021   IR US  GUIDE VASC ACCESS RIGHT  07/22/2021   IR US  GUIDE VASC ACCESS RIGHT  05/25/2022   IR US  GUIDE VASC ACCESS RIGHT  06/15/2022   IR US  GUIDE VASC ACCESS RIGHT  11/09/2023   IR US  GUIDE VASC ACCESS RIGHT  11/22/2023   NO PAST SURGERIES       reports that he has never smoked. He has never used smokeless tobacco. He reports current alcohol use. He reports that he does not currently use drugs.  Allergies[1]  Family History  Problem Relation Age of Onset   Diabetes Mother     Prior to Admission medications  Medication Sig Start Date End Date Taking? Authorizing Provider  amoxicillin -clavulanate (AUGMENTIN ) 875-125 MG tablet Take 1 tablet by mouth every 12 (twelve) hours. 09/08/24   Horton, Roxie HERO, DO  Ascorbic Acid  (VITAMIN C) 100 MG tablet Take 100 mg by mouth daily.    [provider]  bacitracin  ointment Apply 1 Application topically 2 (two) times daily. 09/08/24   Horton, Kristie M, DO  blood glucose meter kit and supplies Dispense based on patient and insurance preference. Use up to four times daily as directed. (FOR ICD-10 E10.9, E11.9). 11/03/21   Patsy Lenis, MD  Glucagon  (GVOKE HYPOPEN  2-PACK) 1 MG/0.2ML SOAJ Inject 1 mg into the skin as needed for up to 2 doses (Severe low blood sugar). 07/21/24   Rojelio Nest, DO  insulin  glargine (LANTUS ) 100 UNIT/ML injection Inject 0.1 mLs (10 Units total) into the skin daily. 07/22/24   Rojelio Nest, DO  insulin  lispro (HUMALOG ) 100 UNIT/ML KwikPen Check your blood sugar prior to your meal, three times daily. Then depending on your blood sugar, inject insulin  as follows: Blood Glucose 121 - 150: 1 units.. BG 151 - 200: 2 units.. BG 201 - 250: 3 units.. BG 251 - 300: 5 units.. BG  301 - 350: 7 units.. BG 351 - 400: 9 units. 07/21/24   Rojelio Nest, DO  Insulin  Pen Needle (PEN NEEDLES 3/16) 31G X 5 MM MISC 1 each by Does not apply route with breakfast, with lunch, and with evening meal. 11/03/21   Patsy Lenis, MD  Multiple Vitamin (MULTIVITAMIN WITH MINERALS) TABS tablet Take 1 tablet by mouth daily.    [provider]  vitamin B-12 (CYANOCOBALAMIN ) 100 MCG tablet Take 100 mcg by mouth daily.    [provider]    Physical Exam: Vitals:   09/30/24 1451 09/30/24 1533 09/30/24 1615 09/30/24 1630  BP: 111/68  (!) 181/102   Pulse: 63  (!) 104 91  Resp: 17  19 14   Temp:  (!) 96 F (35.6 C)    TempSrc: Oral Rectal  SpO2: 97%  100% 99%    Constitutional: NAD, calm, comfortable Eyes: PERRL, lids and conjunctivae normal ENMT: Mucous membranes are moist.  Neck: normal, supple Respiratory: clear to auscultation bilaterally, no wheezing, no crackles.  Cardiovascular: Regular rate and rhythm, no murmurs / rubs / gallops. No extremity edema. .  Abdomen: no tenderness, no masses palpated. Bowel sounds positive.  Musculoskeletal: no clubbing / cyanosis. Normal muscle tone.  Skin: no rashes, lesions, ulcers. No induration Neurologic: non focal   Labs on Admission: I have personally reviewed following labs and imaging studies  CBC: Recent Labs  Lab 09/30/24 1600  WBC 7.1  NEUTROABS 6.1  HGB 13.3  HCT 39.0  MCV 88.0  PLT 117*   Basic Metabolic Panel: Recent Labs  Lab 09/30/24 1600  NA 136  K 3.7  CL 102  CO2 25  GLUCOSE 50*  BUN 16  CREATININE 0.59*  CALCIUM 9.0   Liver Function Tests: Recent Labs  Lab 09/30/24 1600  AST 61*  ALT 39  ALKPHOS 55  BILITOT 0.7  PROT 7.5  ALBUMIN  3.9   Coagulation Profile: Recent Labs  Lab 09/30/24 1600  INR 1.0   BNP (last 3 results) No results for input(s): PROBNP in the last 8760 hours. CBG: Recent Labs  Lab 09/30/24 1405 09/30/24 1652  GLUCAP 121* 49*   Thyroid  Function  Tests: Recent Labs    09/30/24 1559  TSH 1.150   Urine analysis:    Component Value Date/Time   COLORURINE YELLOW 07/20/2024 1221   APPEARANCEUR CLEAR 07/20/2024 1221   LABSPEC 1.023 07/20/2024 1221   PHURINE 7.0 07/20/2024 1221   GLUCOSEU NEGATIVE 07/20/2024 1221   HGBUR NEGATIVE 07/20/2024 1221   BILIRUBINUR NEGATIVE 07/20/2024 1221   KETONESUR NEGATIVE 07/20/2024 1221   PROTEINUR NEGATIVE 07/20/2024 1221   UROBILINOGEN 0.2 05/05/2007 2151   NITRITE NEGATIVE 07/20/2024 1221   LEUKOCYTESUR NEGATIVE 07/20/2024 1221     Radiological Exams on Admission: CT Head Wo Contrast Result Date: 09/30/2024 EXAM: CT HEAD WITHOUT 09/30/2024 04:38:30 PM TECHNIQUE: CT of the head was performed without the administration of intravenous contrast. Automated exposure control, iterative reconstruction, and/or weight based adjustment of the mA/kV was utilized to reduce the radiation dose to as low as reasonably achievable. COMPARISON: head CT and MRI 07/20/2024 CLINICAL HISTORY: Mental status change, unknown cause; Hypoglycemia at home with altered mental status and somnolence here. Arousable to loud voice. FINDINGS: BRAIN AND VENTRICLES: There is no evidence of an acute infarct, intracranial hemorrhage, mass, midline shift, hydrocephalus, or extra-axial fluid collection. Cerebral volume is normal. Cerebral white matter hypodensities are unchanged and nonspecific but compatible with mild chronic small vessel ischemic disease. Calcified atherosclerosis at the skull base. ORBITS: Bilateral cataract extraction. SINUSES AND MASTOIDS: No acute abnormality. SOFT TISSUES AND SKULL: No acute skull fracture. No acute soft tissue abnormality. IMPRESSION: 1. No acute intracranial abnormality. 2. Mild chronic small vessel ischemic disease. Electronically signed by: Dasie Hamburg MD 09/30/2024 05:08 PM EST RP Workstation: [id number]   DG Chest Portable 1 View Result Date: 09/30/2024 CLINICAL DATA:  Altered mental status.  EXAM: PORTABLE CHEST 1 VIEW COMPARISON:  Chest radiograph dated 07/20/2024. FINDINGS: No focal consolidation, pleural effusion or pneumothorax. The cardiac silhouette is within normal limits. No acute osseous pathology. IMPRESSION: No active disease. Electronically Signed   By: Vanetta Chou M.D.   On: 09/30/2024 15:56    EKG: Independently reviewed.  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  He has no bleeding  Elevated blood pressure-hydralazine  PRN, he does not appear to be on blood pressure medications at home  DVT prophylaxis: Lovenox   Code Status: Full code  Family Communication: no family in the room Bed Type: Progressive  Consults called: none  Obs/Inp: Obs  Nilda Fendt, MD, PhD [facility]  Contact via [email or web address]  09/30/2024, 6:18 PM         [1] No Known Allergies

## 2024-09-30 NOTE — ED Triage Notes (Signed)
 BIBA from home - Found this AM on his couch with snoring respirations- received CPR for 2 minutes from neighbor. Pt is cold to touch, no heat in home. Pt also has pain in bilateral legs. 25 g D10 given PTA. 20 gauge left hand CBG 40 initially,  213 after D10

## 2024-10-01 DIAGNOSIS — Z794 Long term (current) use of insulin: Secondary | ICD-10-CM | POA: Diagnosis not present

## 2024-10-01 DIAGNOSIS — Z79899 Other long term (current) drug therapy: Secondary | ICD-10-CM | POA: Diagnosis not present

## 2024-10-01 DIAGNOSIS — X58XXXA Exposure to other specified factors, initial encounter: Secondary | ICD-10-CM | POA: Diagnosis present

## 2024-10-01 DIAGNOSIS — E11649 Type 2 diabetes mellitus with hypoglycemia without coma: Secondary | ICD-10-CM | POA: Diagnosis present

## 2024-10-01 DIAGNOSIS — T383X1A Poisoning by insulin and oral hypoglycemic [antidiabetic] drugs, accidental (unintentional), initial encounter: Secondary | ICD-10-CM | POA: Diagnosis present

## 2024-10-01 DIAGNOSIS — E162 Hypoglycemia, unspecified: Secondary | ICD-10-CM | POA: Diagnosis not present

## 2024-10-01 DIAGNOSIS — K769 Liver disease, unspecified: Secondary | ICD-10-CM | POA: Diagnosis present

## 2024-10-01 DIAGNOSIS — E1165 Type 2 diabetes mellitus with hyperglycemia: Secondary | ICD-10-CM | POA: Diagnosis not present

## 2024-10-01 DIAGNOSIS — Z8505 Personal history of malignant neoplasm of liver: Secondary | ICD-10-CM | POA: Diagnosis not present

## 2024-10-01 DIAGNOSIS — E872 Acidosis, unspecified: Secondary | ICD-10-CM | POA: Diagnosis present

## 2024-10-01 DIAGNOSIS — Z833 Family history of diabetes mellitus: Secondary | ICD-10-CM | POA: Diagnosis not present

## 2024-10-01 DIAGNOSIS — D696 Thrombocytopenia, unspecified: Secondary | ICD-10-CM | POA: Diagnosis present

## 2024-10-01 DIAGNOSIS — R400 Somnolence: Secondary | ICD-10-CM | POA: Diagnosis present

## 2024-10-01 DIAGNOSIS — I10 Essential (primary) hypertension: Secondary | ICD-10-CM | POA: Diagnosis present

## 2024-10-01 LAB — COMPREHENSIVE METABOLIC PANEL WITH GFR
ALT: 34 U/L (ref 0–44)
AST: 51 U/L — ABNORMAL HIGH (ref 15–41)
Albumin: 3.7 g/dL (ref 3.5–5.0)
Alkaline Phosphatase: 54 U/L (ref 38–126)
Anion gap: 10 (ref 5–15)
BUN: 14 mg/dL (ref 8–23)
CO2: 21 mmol/L — ABNORMAL LOW (ref 22–32)
Calcium: 8.7 mg/dL — ABNORMAL LOW (ref 8.9–10.3)
Chloride: 96 mmol/L — ABNORMAL LOW (ref 98–111)
Creatinine, Ser: 0.76 mg/dL (ref 0.61–1.24)
GFR, Estimated: >60 mL/min (ref >60)
Glucose, Bld: 619 mg/dL (ref 70–99)
Potassium: 4.2 mmol/L (ref 3.5–5.1)
Sodium: 127 mmol/L — ABNORMAL LOW (ref 135–145)
Total Bilirubin: 1 mg/dL (ref 0.0–1.2)
Total Protein: 6.9 g/dL (ref 6.5–8.1)

## 2024-10-01 LAB — CBC
HCT: 35.7 % — ABNORMAL LOW (ref 39.0–52.0)
Hemoglobin: 12 g/dL — ABNORMAL LOW (ref 13.0–17.0)
MCH: 30.2 pg (ref 26.0–34.0)
MCHC: 33.6 g/dL (ref 30.0–36.0)
MCV: 89.9 fL (ref 80.0–100.0)
Platelets: 84 K/uL — ABNORMAL LOW (ref 150–400)
RBC: 3.97 MIL/uL — ABNORMAL LOW (ref 4.22–5.81)
RDW: 12.6 % (ref 11.5–15.5)
WBC: 5.6 K/uL (ref 4.0–10.5)
nRBC: 0 % (ref 0.0–0.2)

## 2024-10-01 LAB — GLUCOSE, CAPILLARY
Glucose-Capillary: 85 mg/dL (ref 70–99)
Glucose-Capillary: 211 mg/dL — ABNORMAL HIGH (ref 70–99)
Glucose-Capillary: 201 mg/dL — ABNORMAL HIGH (ref 70–99)

## 2024-10-01 LAB — CBG MONITORING, ED
Glucose-Capillary: 388 mg/dL — ABNORMAL HIGH (ref 70–99)
Glucose-Capillary: 295 mg/dL — ABNORMAL HIGH (ref 70–99)
Glucose-Capillary: 314 mg/dL — ABNORMAL HIGH (ref 70–99)
Glucose-Capillary: 329 mg/dL — ABNORMAL HIGH (ref 70–99)
Glucose-Capillary: 384 mg/dL — ABNORMAL HIGH (ref 70–99)
Glucose-Capillary: 370 mg/dL — ABNORMAL HIGH (ref 70–99)

## 2024-10-01 LAB — BASIC METABOLIC PANEL WITH GFR
Anion gap: 10 (ref 5–15)
BUN: 13 mg/dL (ref 8–23)
CO2: 23 mmol/L (ref 22–32)
Calcium: 8.9 mg/dL (ref 8.9–10.3)
Chloride: 102 mmol/L (ref 98–111)
Creatinine, Ser: 0.9 mg/dL (ref 0.61–1.24)
GFR, Estimated: >60 mL/min (ref >60)
Glucose, Bld: 149 mg/dL — ABNORMAL HIGH (ref 70–99)
Potassium: 3.8 mmol/L (ref 3.5–5.1)
Sodium: 135 mmol/L (ref 135–145)

## 2024-10-01 LAB — MRSA NEXT GEN BY PCR, NASAL: MRSA by PCR Next Gen: DETECTED — AB

## 2024-10-01 LAB — MAGNESIUM: Magnesium: 2.1 mg/dL (ref 1.7–2.4)

## 2024-10-01 MED ORDER — BLOOD GLUCOSE TEST VI STRP: 1.0000 | ORAL_STRIP | 0 refills | Status: AC

## 2024-10-01 MED ORDER — LISINOPRIL 2.5 MG PO TABS
5.0000 mg | ORAL_TABLET | Freq: Every day | ORAL | Status: DC
  Administered 2024-10-01 – 2024-10-02 (×2): 5 mg via ORAL
  Filled 2024-10-01 (×2): qty 2

## 2024-10-01 MED ORDER — LISINOPRIL 5 MG PO TABS: 5.0000 mg | ORAL_TABLET | Freq: Every day | ORAL | 0 refills | Status: AC

## 2024-10-01 MED ORDER — BLOOD GLUCOSE MONITORING SUPPL DEVI: 1.0000 | 0 refills | Status: AC

## 2024-10-01 MED ORDER — MUPIROCIN 2 % EX OINT
TOPICAL_OINTMENT | Freq: Two times a day (BID) | CUTANEOUS | Status: DC
  Filled 2024-10-01: qty 22

## 2024-10-01 MED ORDER — SODIUM CHLORIDE 0.9 % IV BOLUS
1000.0000 mL | Freq: Once | INTRAVENOUS | Status: AC
  Administered 2024-10-01: 09:00:00 1000 mL via INTRAVENOUS

## 2024-10-01 MED ORDER — ORAL CARE MOUTH RINSE: 15.0000 mL | OROMUCOSAL | Status: DC | PRN

## 2024-10-01 MED ORDER — LANCETS MISC: 1.0000 | 0 refills | Status: AC

## 2024-10-01 MED ORDER — LANCET DEVICE MISC: 1.0000 | 0 refills | Status: AC

## 2024-10-01 MED ORDER — INSULIN ASPART 100 UNIT/ML IJ SOLN
0.0000 [IU] | Freq: Three times a day (TID) | INTRAMUSCULAR | Status: DC
  Administered 2024-10-01: 17:00:00 5 [IU] via SUBCUTANEOUS
  Administered 2024-10-01: 08:00:00 15 [IU] via SUBCUTANEOUS
  Administered 2024-10-02: 09:00:00 3 [IU] via SUBCUTANEOUS
  Filled 2024-10-01: qty 15
  Filled 2024-10-01: qty 3
  Filled 2024-10-01: qty 5

## 2024-10-01 MED ORDER — INSULIN GLARGINE 100 UNIT/ML ~~LOC~~ SOLN
10.0000 [IU] | Freq: Every day | SUBCUTANEOUS | Status: DC
  Filled 2024-10-01: qty 0.1

## 2024-10-01 MED ORDER — CHLORHEXIDINE GLUCONATE CLOTH 2 % EX PADS
6.0000 | MEDICATED_PAD | Freq: Every day | CUTANEOUS | Status: DC
  Administered 2024-10-01: 11:00:00 6 via TOPICAL

## 2024-10-01 MED ORDER — INSULIN ASPART 100 UNIT/ML IJ SOLN
0.0000 [IU] | Freq: Every day | INTRAMUSCULAR | Status: DC
  Administered 2024-10-01: 22:00:00 2 [IU] via SUBCUTANEOUS
  Filled 2024-10-01: qty 2

## 2024-10-01 MED ORDER — INSULIN GLARGINE 100 UNIT/ML ~~LOC~~ SOLN: 20.0000 [IU] | Freq: Every day | SUBCUTANEOUS | Status: DC

## 2024-10-01 MED ORDER — INSULIN GLARGINE 100 UNITS/ML SOLOSTAR PEN: 10.0000 [IU] | PEN_INJECTOR | SUBCUTANEOUS | Status: DC

## 2024-10-01 MED ORDER — INSULIN GLARGINE 100 UNIT/ML ~~LOC~~ SOLN: 10.0000 [IU] | Freq: Every day | SUBCUTANEOUS | Status: DC

## 2024-10-01 MED ORDER — INSULIN GLARGINE 100 UNIT/ML ~~LOC~~ SOLN
15.0000 [IU] | Freq: Two times a day (BID) | SUBCUTANEOUS | Status: DC
  Administered 2024-10-01 – 2024-10-02 (×3): 15 [IU] via SUBCUTANEOUS
  Filled 2024-10-01 (×4): qty 0.15

## 2024-10-01 MED ORDER — INSULIN GLARGINE 100 UNIT/ML ~~LOC~~ SOLN
20.0000 [IU] | Freq: Every day | SUBCUTANEOUS | Status: DC
  Filled 2024-10-01: qty 0.2

## 2024-10-01 NOTE — ED Notes (Signed)
 Repositioned patient and took some blankets off. Patient is resting at this time. Urinal was emptied and patient has no other request at this time.

## 2024-10-01 NOTE — Plan of Care (Signed)
   Problem: Education: Goal: Ability to describe self-care measures that may prevent or decrease complications (Diabetes Survival Skills Education) will improve Outcome: Progressing Goal: Individualized Educational Video(s) Outcome: Progressing   Problem: Coping: Goal: Ability to adjust to condition or change in health will improve Outcome: Progressing   Problem: Health Behavior/Discharge Planning: Goal: Ability to identify and utilize available resources and services will improve Outcome: Progressing Goal: Ability to manage health-related needs will improve Outcome: Progressing   Problem: Nutritional: Goal: Maintenance of adequate nutrition will improve Outcome: Progressing Goal: Progress toward achieving an optimal weight will improve Outcome: Progressing

## 2024-10-01 NOTE — Progress Notes (Signed)
 PROGRESS NOTE  Shawn Bailey FMW:[id number] DOB: 14-Jan-1946 DOA: 09/30/2024 PCP: Norval Kettle, MD  Hospital Course/Subjective: 78 year old male with history of insulin -dependent type 2 diabetes, HCC status post immunotherapy and embolization currently in observation, who admitted to the hospital with symptomatic hypoglycemia.  Patient tells me that he takes insulin  but does not check his blood sugars at home, his PCP increased his Lantus  to 35 units every morning last month, and Lantus  20 units nightly.  His ex-wife who lives next-door and helps him with his medication told him not to take the bedtime Lantus  as she was afraid it was too much.  Patient states he has been taking Lantus  35 units every morning for the last month or so.  States that maybe he did not eat enough in the last day.  His family and ex-wife are having trouble getting in touch with him, he his neighbor went into his home and found him lying on the floor barely awake.  Workup in the emergency department was pretty benign other than severe and recurrent hypoglycemia.  He was admitted to the hospitalist service on D10 infusion.  This morning he feels fine and is completely asymptomatic.  Assessment/Plan: Hypoglycemia-seems the patient was not eating very well, his insulin  dosing was significantly increased and he does not check his blood sugars at home.  He was placed on D5 infusion overnight, and not given any insulin .  This morning his blood sugar is significantly elevated.  Hyperglycemia, DM2-due to long-acting insulin  being discontinued, and being placed on D5 infusion overnight.  Blood sugar is now currently elevated over 600, with evidence of minimal acidosis. -Will start Lantus  15 units twice daily -Moderate dose sliding scale, will give 15 units NovoLog  now -Carb modified diet -hemoglobin A1c 10/25 was 8.6 -Will prescribe glucometer and supplies, patient was advised to document his blood sugars at least twice daily  for PCP to review  Pseudohyponatremia-due to hyperglycemia  Metabolic acidosis-due to hyperglycemia, will give IV fluids now and monitor blood sugars closely.  Will recheck BMP this afternoon.  Hypertension-does not appear to be on any home medications, will start low-dose lisinopril   DVT Prophylaxis: Lovenox   Code Status: Full code  Family Communication: No family present  Disposition Plan: Observation  Antimicrobials: Anti-infectives (From admission, onward)    None       Objective: Vitals:   10/01/24 0215 10/01/24 0336 10/01/24 0540 10/01/24 0750  BP:   135/82 (!) 147/88  Pulse: 84  87 93  Resp: 14  16 16   Temp:  98.9 F (37.2 C) 97.8 F (36.6 C)   TempSrc:  Oral Oral   SpO2: 98%  99% 100%   No intake or output data in the 24 hours ending 10/01/24 0826 There were no vitals filed for this visit. Exam: General:  Alert, oriented, calm, in no acute distress Eyes: EOMI, clear sclerea Neck: supple, no masses, trachea mildline  Cardiovascular: RRR, no murmurs or rubs, no peripheral edema  Respiratory: clear to auscultation bilaterally, no wheezes, no crackles  Abdomen: soft, nontender, nondistended, normal bowel tones heard  Skin: dry, no rashes  Musculoskeletal: no joint effusions, normal range of motion  Psychiatric: appropriate affect, normal speech  Neurologic: extraocular muscles intact, clear speech, moving all extremities with intact sensorium   Data Reviewed: CBC: Recent Labs  Lab 09/30/24 1600 10/01/24 0731  WBC 7.1 5.6  NEUTROABS 6.1  --   HGB 13.3 12.0*  HCT 39.0 35.7*  MCV 88.0 89.9  PLT 117* 84*  Basic Metabolic Panel: Recent Labs  Lab 09/30/24 1600 10/01/24 0731  NA 136 127*  K 3.7 4.2  CL 102 96*  CO2 25 21*  GLUCOSE 50* 619*  BUN 16 14  CREATININE 0.59* 0.76  CALCIUM 9.0 8.7*  MG  --  2.1   GFR: CrCl cannot be calculated (Unknown ideal weight.). Liver Function Tests: Recent Labs  Lab 09/30/24 1600 10/01/24 0731  AST 61*  51*  ALT 39 34  ALKPHOS 55 54  BILITOT 0.7 1.0  PROT 7.5 6.9  ALBUMIN  3.9 3.7   Recent Labs  Lab 09/30/24 1600  LIPASE 16   Recent Labs  Lab 09/30/24 1558  AMMONIA 34   Coagulation Profile: Recent Labs  Lab 09/30/24 1600  INR 1.0   Cardiac Enzymes: No results for input(s): CKTOTAL, CKMB, CKMBINDEX, TROPONINI in the last 168 hours. BNP (last 3 results) No results for input(s): PROBNP in the last 8760 hours. HbA1C: No results for input(s): HGBA1C in the last 72 hours. CBG: Recent Labs  Lab 10/01/24 0334 10/01/24 0429 10/01/24 0538 10/01/24 0655 10/01/24 0756  GLUCAP 314* 295* 329* 384* 370*   Lipid Profile: No results for input(s): CHOL, HDL, LDLCALC, TRIG, CHOLHDL, LDLDIRECT in the last 72 hours. Thyroid  Function Tests: Recent Labs    09/30/24 1559  TSH 1.150   Anemia Panel: No results for input(s): VITAMINB12, FOLATE, FERRITIN, TIBC, IRON, RETICCTPCT in the last 72 hours. Urine analysis:    Component Value Date/Time   COLORURINE YELLOW 09/30/2024 1837   APPEARANCEUR HAZY (A) 09/30/2024 1837   LABSPEC 1.019 09/30/2024 1837   PHURINE 7.0 09/30/2024 1837   GLUCOSEU 150 (A) 09/30/2024 1837   HGBUR NEGATIVE 09/30/2024 1837   BILIRUBINUR NEGATIVE 09/30/2024 1837   KETONESUR 5 (A) 09/30/2024 1837   PROTEINUR NEGATIVE 09/30/2024 1837   UROBILINOGEN 0.2 05/05/2007 2151   NITRITE NEGATIVE 09/30/2024 1837   LEUKOCYTESUR NEGATIVE 09/30/2024 1837   Sepsis Labs: @LABRCNTIP (procalcitonin:4,lacticidven:4)  )No results found for this or any previous visit (from the past 240 hours).   Studies: CT Head Wo Contrast Result Date: 09/30/2024 EXAM: CT HEAD WITHOUT 09/30/2024 04:38:30 PM TECHNIQUE: CT of the head was performed without the administration of intravenous contrast. Automated exposure control, iterative reconstruction, and/or weight based adjustment of the mA/kV was utilized to reduce the radiation dose to as low as  reasonably achievable. COMPARISON: head CT and MRI 07/20/2024 CLINICAL HISTORY: Mental status change, unknown cause; Hypoglycemia at home with altered mental status and somnolence here. Arousable to loud voice. FINDINGS: BRAIN AND VENTRICLES: There is no evidence of an acute infarct, intracranial hemorrhage, mass, midline shift, hydrocephalus, or extra-axial fluid collection. Cerebral volume is normal. Cerebral white matter hypodensities are unchanged and nonspecific but compatible with mild chronic small vessel ischemic disease. Calcified atherosclerosis at the skull base. ORBITS: Bilateral cataract extraction. SINUSES AND MASTOIDS: No acute abnormality. SOFT TISSUES AND SKULL: No acute skull fracture. No acute soft tissue abnormality. IMPRESSION: 1. No acute intracranial abnormality. 2. Mild chronic small vessel ischemic disease. Electronically signed by: Dasie Hamburg MD 09/30/2024 05:08 PM EST RP Workstation: [id number]   DG Chest Portable 1 View Result Date: 09/30/2024 CLINICAL DATA:  Altered mental status. EXAM: PORTABLE CHEST 1 VIEW COMPARISON:  Chest radiograph dated 07/20/2024. FINDINGS: No focal consolidation, pleural effusion or pneumothorax. The cardiac silhouette is within normal limits. No acute osseous pathology. IMPRESSION: No active disease. Electronically Signed   By: Vanetta Chou M.D.   On: 09/30/2024 15:56    Scheduled Meds:  enoxaparin  (  LOVENOX ) injection  40 mg Subcutaneous Q24H   insulin  aspart  0-15 Units Subcutaneous TID WC   insulin  aspart  0-5 Units Subcutaneous QHS   insulin  glargine  15 Units Subcutaneous BID   lisinopril   5 mg Oral Daily    Continuous Infusions:  sodium chloride        LOS: 0 days   Time spent: 31 minutes  Merisa Julio CHRISTELLA Gail, MD [facility] Pager [phone]  If 7PM-7AM, please contact night-coverage [email or web address] Password [email or web address] 10/01/2024, 8:26 AM

## 2024-10-01 NOTE — ED Notes (Addendum)
 CBG Q1 hr next @0430 

## 2024-10-02 ENCOUNTER — Other Ambulatory Visit (HOSPITAL_COMMUNITY): Payer: Self-pay

## 2024-10-02 LAB — GLUCOSE, CAPILLARY
Glucose-Capillary: 164 mg/dL — ABNORMAL HIGH (ref 70–99)
Glucose-Capillary: 154 mg/dL — ABNORMAL HIGH (ref 70–99)

## 2024-10-02 MED ORDER — OXYCODONE HCL 5 MG PO TABS
5.0000 mg | ORAL_TABLET | Freq: Once | ORAL | Status: AC | PRN
  Administered 2024-10-02: 01:00:00 10 mg via ORAL
  Filled 2024-10-02: qty 2

## 2024-10-02 MED ORDER — INSULIN GLARGINE 100 UNIT/ML ~~LOC~~ SOLN: 30.0000 [IU] | Freq: Every day | SUBCUTANEOUS | 0 refills | Status: AC

## 2024-10-02 MED ORDER — INSULIN LISPRO (1 UNIT DIAL) 100 UNIT/ML (KWIKPEN)
2.0000 [IU] | PEN_INJECTOR | Freq: Three times a day (TID) | SUBCUTANEOUS | 11 refills | Status: AC
  Filled 2024-10-02 (×2): qty 15, 250d supply, fill #0

## 2024-10-02 NOTE — Discharge Summary (Signed)
 Physician Discharge Summary  Shawn Bailey FMW:[id number] DOB: 25-Sep-1946 DOA: 09/30/2024  PCP: Norval Kettle, MD  Admit date: 09/30/2024 Discharge date: 10/04/2024     Discharge Diagnoses:   Principal Problem:   Hypoglycemia Active Problems:   DM type 2 (diabetes mellitus, type 2) (HCC)   Benign essential HTN     Hospital Course/Subjective: 78 year old male with history of insulin -dependent type 2 diabetes, HCC status post immunotherapy and embolization currently in observation, who admitted to the hospital with symptomatic hypoglycemia.  Patient tells me that he takes insulin  but does not check his blood sugars at home, his PCP increased his Lantus  to 35 units every morning last month, and Lantus  20 units nightly.  His ex-wife who lives next-door and helps him with his medication told him not to take the bedtime Lantus  as she was afraid it was too much.  Patient states he has been taking Lantus  35 units every morning for the last month or so.  States that maybe he did not eat enough in the last day.  His family and ex-wife are having trouble getting in touch with him, he his neighbor went into his home and found him lying on the floor barely awake.  Workup in the emergency department was pretty benign other than severe and recurrent hypoglycemia.  He was admitted to the hospitalist service on D10 infusion.    Assessment/Plan: Hypoglycemia-States his dose of Lantus  was increased at home but his sugars after the meals and at bedtime were more of a problem than the fasting glucose. He typically uses Humalog  based on a sliding scale prior to eating. I have prescribed 2 U of Humalog  with each meal which should help with improving the glucose levels after meals. He has been explained to keep a recording of his CBGs and take the record to his doctor who will decide on further adjustments of his insulin  doses.     Pseudohyponatremia-due to hyperglycemia- resolved    Metabolic acidosis-Bicarb  21- improved to 23   Hypertension-does not appear to be on any home medications- Lisinopril  initiated      Discharge Instructions   Allergies as of 10/02/2024   No Known Allergies      Medication List     STOP taking these medications    amoxicillin -clavulanate 875-125 MG tablet Commonly known as: AUGMENTIN    bacitracin  ointment   blood glucose meter kit and supplies       TAKE these medications    ascorbic acid  500 MG tablet Commonly known as: VITAMIN C Take 500 mg by mouth daily.   Blood Glucose Monitoring Suppl Devi 1 each by Does not apply route as directed. Dispense based on patient and insurance preference. Use up to four times daily as directed. (FOR ICD-10 E10.9, E11.9).   BLOOD GLUCOSE TEST STRIPS Strp 1 each by Does not apply route as directed. Dispense based on patient and insurance preference. Use up to four times daily as directed. (FOR ICD-10 E10.9, E11.9).   fluticasone 50 MCG/ACT nasal spray Commonly known as: FLONASE Place 2 sprays into both nostrils 2 (two) times daily as needed for allergies or rhinitis.   Gvoke HypoPen  2-Pack 1 MG/0.2ML Soaj Generic drug: Glucagon  Inject 1 mg into the skin as needed for up to 2 doses (Severe low blood sugar).   insulin  glargine 100 UNIT/ML injection Commonly known as: LANTUS  Inject 0.3 mLs (30 Units total) into the skin daily. What changed: how much to take   insulin  lispro 100 UNIT/ML KwikPen Commonly known  as: HUMALOG  Check your blood sugar prior to your meal, three times daily. Then depending on your blood sugar, inject insulin  as follows: Blood Glucose 121 - 150: 1 units.. BG 151 - 200: 2 units.. BG 201 - 250: 3 units.. BG 251 - 300: 5 units.. BG 301 - 350: 7 units.. BG 351 - 400: 9 units. What changed: Another medication with the same name was added. Make sure you understand how and when to take each.   insulin  lispro 100 UNIT/ML KwikPen Commonly known as: HUMALOG  Inject 2 Units into the skin 3  (three) times daily. What changed: You were already taking a medication with the same name, and this prescription was added. Make sure you understand how and when to take each.   Lancet Device Misc 1 each by Does not apply route as directed. Dispense based on patient and insurance preference. Use up to four times daily as directed. (FOR ICD-10 E10.9, E11.9).   Lancets Misc 1 each by Does not apply route as directed. Dispense based on patient and insurance preference. Use up to four times daily as directed. (FOR ICD-10 E10.9, E11.9).   lisinopril  5 MG tablet Commonly known as: ZESTRIL  Take 1 tablet (5 mg total) by mouth daily.   multivitamin with minerals Tabs tablet Take 1 tablet by mouth daily with breakfast.   Oxycodone  HCl 10 MG Tabs Take 10 mg by mouth 4 (four) times daily as needed (for pain).   Pen Needles 3/16 31G X 5 MM Misc 1 each by Does not apply route with breakfast, with lunch, and with evening meal.   vitamin B-12 100 MCG tablet Commonly known as: CYANOCOBALAMIN  Take 100 mcg by mouth daily.        Follow-up Information     Norval Kettle, MD Follow up.   Specialty: Internal Medicine Contact information: [address] [place] [address] [phone]                    The results of significant diagnostics from this hospitalization (including imaging, microbiology, ancillary and laboratory) are listed below for reference.    CT Head Wo Contrast Result Date: 09/30/2024 EXAM: CT HEAD WITHOUT 09/30/2024 04:38:30 PM TECHNIQUE: CT of the head was performed without the administration of intravenous contrast. Automated exposure control, iterative reconstruction, and/or weight based adjustment of the mA/kV was utilized to reduce the radiation dose to as low as reasonably achievable. COMPARISON: head CT and MRI 07/20/2024 CLINICAL HISTORY: Mental status change, unknown cause; Hypoglycemia at home with altered mental status and somnolence here.  Arousable to loud voice. FINDINGS: BRAIN AND VENTRICLES: There is no evidence of an acute infarct, intracranial hemorrhage, mass, midline shift, hydrocephalus, or extra-axial fluid collection. Cerebral volume is normal. Cerebral white matter hypodensities are unchanged and nonspecific but compatible with mild chronic small vessel ischemic disease. Calcified atherosclerosis at the skull base. ORBITS: Bilateral cataract extraction. SINUSES AND MASTOIDS: No acute abnormality. SOFT TISSUES AND SKULL: No acute skull fracture. No acute soft tissue abnormality. IMPRESSION: 1. No acute intracranial abnormality. 2. Mild chronic small vessel ischemic disease. Electronically signed by: Dasie Hamburg MD 09/30/2024 05:08 PM EST RP Workstation: [id number]   DG Chest Portable 1 View Result Date: 09/30/2024 CLINICAL DATA:  Altered mental status. EXAM: PORTABLE CHEST 1 VIEW COMPARISON:  Chest radiograph dated 07/20/2024. FINDINGS: No focal consolidation, pleural effusion or pneumothorax. The cardiac silhouette is within normal limits. No acute osseous pathology. IMPRESSION: No active disease. Electronically Signed   By: Vanetta  Radparvar M.D.   On: 09/30/2024 15:56   DG Finger Index Left Result Date: 09/08/2024 CLINICAL DATA:  Infection. EXAM: LEFT INDEX FINGER 2+V COMPARISON:  Left hand radiograph dated 07/20/2024. FINDINGS: No acute fracture or dislocation. 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 Mild arthritic changes of the interphalangeal joints. There is diffuse soft tissue swelling. Focal area of skin thickening and probable ulceration of the proximal index finger. No radiopaque foreign body or soft tissue gas. IMPRESSION: 1. No acute fracture or dislocation. 2. Diffuse soft tissue swelling. Electronically Signed   By: Vanetta Chou M.D.   On: 09/08/2024 13:38   Labs:   Basic Metabolic Panel: Recent Labs  Lab 09/30/24 1600 10/01/24 0731 10/01/24 1240  NA 136 127* 135  K 3.7 4.2 3.8  CL 102 96* 102  CO2 25 21* 23   GLUCOSE 50* 619* 149*  BUN 16 14 13   CREATININE 0.59* 0.76 0.90  CALCIUM 9.0 8.7* 8.9  MG  --  2.1  --      CBC: Recent Labs  Lab 09/30/24 1600 10/01/24 0731  WBC 7.1 5.6  NEUTROABS 6.1  --   HGB 13.3 12.0*  HCT 39.0 35.7*  MCV 88.0 89.9  PLT 117* 84*         SIGNED:   True Atlas, MD  [facility] 10/04/2024, 4:36 PM Time taking on discharge: 50 minutes

## 2024-10-02 NOTE — Discharge Instructions (Signed)
 Please take 2 units of insulin  with each meal. Check your sugars with each meal and take this record to your doctor in 2 weeks.   You were cared for by a hospitalist during your hospital stay. Please review all of you discharge paperwork on the day of discharge and be sure you have all of your prescribed medications and please read the below instructions:  Once you are discharged, your primary care physician will handle any further medical issues. Please note that NO REFILLS for any discharge medications will be authorized once you are discharged as it is imperative that you return to your primary care physician (or establish a relationship with a primary care physician if you do not have one) for your aftercare needs. Please obtain a follow up appointment with your primary care physician within 1-2 weeks of discharge. Please take all your medications with you for your next visit with your Primary MD. Please request your Primary MD to go over all Hospital Tests and Procedures, Radiological results at the follow up appointment. In some cases, there will be blood work, cultures and biopsy results pending at the time of your discharge. Please request that your primary care M.D. goes through all the records of your hospital data and follows up on these results. Please get all hospital records sent to your primary MD by signing hospital release before you go home or request your primary care doctor's office to assist with obtaining medical records.   You must read complete instructions/literature along with all the possible adverse reactions/side effects for all the medicines that have been prescribed to you. Take any new medicines after you have completely understood and accpet all the possible adverse reactions/side effects.  Please take medications as prescribed and speak with your doctor if changes are needed.   If you have smoked or chewed tobacco in the last 2 yrs please stop. Stop any regular  alcohol  and or any recreational drug use. Wear Seat belts while driving.   If you had Pneumonia at the Hospital: Please get a 2 view Chest X ray done in 6-8 weeks after hospital discharge or sooner if instructed by your Primary MD.   If you have Congestive Heart Failure: Follow a cardiac low salt diet and 1.5 lit/day fluid restriction. Please call your Cardiologist or Primary MD anytime you have any of the following symptoms:  1) 3 pound weight gain in 24 hours or 5 pounds in 1 week  2) shortness of breath, with or without a dry hacking cough  3) increasing swelling in the feet or stomach  4) if you have to sleep on extra pillows at night in order to breathe   If you have Diabetes: Check blood sugars 4 times/day- once on AM empty stomach and then before each meal. Log in all results and show them to your primary doctor at your next visit. If glucose readings are often under 60 or above 400 call your primary MD to see if medication dosages need to be adjusted   If you have Syncope (passing out) or Seizure/Convulsions/Epilepsy: Please do not drive, operate heavy machinery, participate in activities at heights or participate in high speed sports until you have seen by Primary MD or a Neurologist and advised to do so again. Per [place]  DMV statutes, patients with seizures are not allowed to drive until they have been seizure-free for six months.  Use caution when using heavy equipment or power tools. Avoid working on ladders or at heights.  Take showers instead of baths. Ensure the water temperature is not too high on the home water heater. Do not go swimming alone. Do not lock yourself in a room alone (i.e. bathroom). When caring for infants or small children, sit down when holding, feeding, or changing them to minimize risk of injury to the child in the event you have a seizure. Maintain good sleep hygiene. Avoid alcohol.    If you had Gastrointestinal Bleeding: Please ask your Primary  MD to check a complete blood count within one week of discharge or at your next visit. Your endoscopic/colonoscopic biopsies that are pending at the time of discharge will also need to followed by your Primary MD.    [place] can reach the hospitalist office at phone [phone] or fax [phone]   If you do not have a primary care physician, you can call [phone] for a physician referral.

## 2024-10-04 DIAGNOSIS — I10 Essential (primary) hypertension: Secondary | ICD-10-CM | POA: Insufficient documentation

## 2024-11-19 ENCOUNTER — Other Ambulatory Visit: Payer: Self-pay

## 2024-11-19 DIAGNOSIS — C220 Liver cell carcinoma: Secondary | ICD-10-CM

## 2024-11-19 DIAGNOSIS — R160 Hepatomegaly, not elsewhere classified: Secondary | ICD-10-CM

## 2024-11-19 NOTE — Assessment & Plan Note (Signed)
 cT3N0M, stage IIIA    -diagnosed in 02/2021, liver biopsy 03/08/21 confirmed moderately differentiated hepatocellular carcinoma. No nodal or distant metastasis on CT 03/05/21.  --He is not a candidate for liver resection or transplant. Dr. Romero at [facility] did not recommend surgery given the location and extent of disease. -He started atezolizumab  and bevacizumab  on 04/28/21, held after 09/2021 due to newly diagnosed type 1 DM with hyperglycemia which was probably related to immunotherapy. --he underwent segment 8 Y90 on 07/22/21 and 06/15/2022  -he had good response to therapies  -Reviewed his restaging CT 09/15/2022 showed stable treated liver lesion, and a enlarging liver lesion in segment VIII (from 4mm to 8mm, LI-RADS category 4) -I personally reviewed his restaging MRI from last week, which showed a stable treated liver lesion, previously LI-RADS [name] category 4 lesion in right lobe of liver is overall stable in size, no definitive washout and enhancing capsule, it is considered LI RADS 3 now.  No additional new lesion. -Patient has developed significant intermittent pain in the right upper quadrant of abdomen, exam showed enlarged left lobe of liver, mild tenderness. -Restaging liver MRI on March 30, 2023 showed stable disease, no new lesions. -His tumor marker AFP remains to be negative (initially at 1479) -Repeated MRI from October 16, 2023 showed stable post embolization appearance of multiple liver lesions, and a slightly enlargement of in arterially hyperenhancing lesion in segment 4A measuring 1.3 cm.  He was referred back to IR and underwent Y90 on 11/22/2023 - Restaging liver MRI on August 10, 2024 showed stable post treatment change, no evidence of residual or new disease.

## 2024-11-20 ENCOUNTER — Inpatient Hospital Stay: Attending: Hematology

## 2024-11-20 ENCOUNTER — Inpatient Hospital Stay: Admitting: Hematology

## 2024-11-20 VITALS — BP 147/89 | HR 82 | Temp 98.0°F | Resp 17 | Wt 130.0 lb

## 2024-11-20 DIAGNOSIS — R160 Hepatomegaly, not elsewhere classified: Secondary | ICD-10-CM

## 2024-11-20 DIAGNOSIS — C220 Liver cell carcinoma: Secondary | ICD-10-CM | POA: Diagnosis not present

## 2024-11-20 LAB — CBC WITH DIFFERENTIAL (CANCER CENTER ONLY)
Abs Immature Granulocytes: 0.01 10*3/uL (ref 0.00–0.07)
Basophils Absolute: 0 10*3/uL (ref 0.0–0.1)
Basophils Relative: 0 %
Eosinophils Absolute: 0.1 10*3/uL (ref 0.0–0.5)
Eosinophils Relative: 2 %
HCT: 37.6 % — ABNORMAL LOW (ref 39.0–52.0)
Hemoglobin: 12.8 g/dL — ABNORMAL LOW (ref 13.0–17.0)
Immature Granulocytes: 0 %
Lymphocytes Relative: 20 %
Lymphs Abs: 0.7 10*3/uL (ref 0.7–4.0)
MCH: 29.4 pg (ref 26.0–34.0)
MCHC: 34 g/dL (ref 30.0–36.0)
MCV: 86.4 fL (ref 80.0–100.0)
Monocytes Absolute: 0.4 10*3/uL (ref 0.1–1.0)
Monocytes Relative: 12 %
Neutro Abs: 2.3 10*3/uL (ref 1.7–7.7)
Neutrophils Relative %: 66 %
Platelet Count: 106 10*3/uL — ABNORMAL LOW (ref 150–400)
RBC: 4.35 MIL/uL (ref 4.22–5.81)
RDW: 12.5 % (ref 11.5–15.5)
WBC Count: 3.4 10*3/uL — ABNORMAL LOW (ref 4.0–10.5)
nRBC: 0 % (ref 0.0–0.2)

## 2024-11-20 LAB — CMP (CANCER CENTER ONLY)
ALT: 41 U/L (ref 0–44)
AST: 52 U/L — ABNORMAL HIGH (ref 15–41)
Albumin: 4.3 g/dL (ref 3.5–5.0)
Alkaline Phosphatase: 59 U/L (ref 38–126)
Anion gap: 8 (ref 5–15)
BUN: 17 mg/dL (ref 8–23)
CO2: 27 mmol/L (ref 22–32)
Calcium: 9.7 mg/dL (ref 8.9–10.3)
Chloride: 97 mmol/L — ABNORMAL LOW (ref 98–111)
Creatinine: 0.85 mg/dL (ref 0.61–1.24)
GFR, Estimated: >60 mL/min
Glucose, Bld: 342 mg/dL — ABNORMAL HIGH (ref 70–99)
Potassium: 4.8 mmol/L (ref 3.5–5.1)
Sodium: 132 mmol/L — ABNORMAL LOW (ref 135–145)
Total Bilirubin: 0.8 mg/dL (ref 0.0–1.2)
Total Protein: 8 g/dL (ref 6.5–8.1)

## 2024-11-20 NOTE — Progress Notes (Signed)
 " [facility]   [phone] Fax:([phone]   Clinic Follow up Note   Patient Care Team: Norval Kettle, MD as PCP - General (Internal Medicine) Lanny Callander, MD as Consulting Physician (Hematology and Oncology)  Date of Service:  11/20/2024  CHIEF COMPLAINT: f/u of [facility]  CURRENT THERAPY:  Observation  Oncology History   Hepatocellular carcinoma (HCC) cT3N0M, stage IIIA    -diagnosed in 02/2021, liver biopsy 03/08/21 confirmed moderately differentiated hepatocellular carcinoma. No nodal or distant metastasis on CT 03/05/21.  --He is not a candidate for liver resection or transplant. Dr. Romero at [facility] did not recommend surgery given the location and extent of disease. -He started atezolizumab  and bevacizumab  on 04/28/21, held after 09/2021 due to newly diagnosed type 1 DM with hyperglycemia which was probably related to immunotherapy. --he underwent segment 8 Y90 on 07/22/21 and 06/15/2022  -he had good response to therapies  -Reviewed his restaging CT 09/15/2022 showed stable treated liver lesion, and a enlarging liver lesion in segment VIII (from 4mm to 8mm, LI-RADS category 4) -I personally reviewed his restaging MRI from last week, which showed a stable treated liver lesion, previously LI-RADS [name] category 4 lesion in right lobe of liver is overall stable in size, no definitive washout and enhancing capsule, it is considered LI RADS 3 now.  No additional new lesion. -Patient has developed significant intermittent pain in the right upper quadrant of abdomen, exam showed enlarged left lobe of liver, mild tenderness. -Restaging liver MRI on March 30, 2023 showed stable disease, no new lesions. -His tumor marker AFP remains to be negative (initially at 1479) -Repeated MRI from October 16, 2023 showed stable post embolization appearance of multiple liver lesions, and a slightly enlargement of in arterially hyperenhancing lesion in segment 4A measuring 1.3 cm.  He was  referred back to IR and underwent Y90 on 11/22/2023 - Restaging liver MRI on August 10, 2024 showed stable post treatment change, no evidence of residual or new disease.  Assessment & Plan Hepatocellular carcinoma in remission No evidence of residual or new disease on recent imaging. Current status is favorable with no symptoms attributable to [facility]. Surveillance and monitoring for long-term effects, including diabetes and liver function, are necessary. - Ordered MRI for late April 2026 for surveillance. - Ordered laboratory studies to be drawn one week prior to next visit. - Scheduled follow-up visit for early May 2026 to review imaging and laboratory results.  Type 2 diabetes mellitus with recent hypoglycemic event Recent severe hypoglycemic event requiring hospitalization, likely due to confusion between long-acting and short-acting insulin  administration. Current regimen adjusted to morning-only long-acting insulin  with sliding scale short-acting insulin . Blood glucose is currently elevated. He remains at risk for both hyperglycemia and hypoglycemia and requires careful monitoring and education. - Reinforced insulin  administration instructions, emphasizing morning-only dosing for long-acting insulin . - Recommended labeling insulin  pens to prevent confusion. - Advised administration of short-acting insulin  prior to lunch due to elevated blood glucose. - Encouraged continued blood glucose monitoring and communication with caregivers.  Hypertension Recently initiated antihypertensive therapy following hospitalization. Blood pressure is well controlled at home, with occasional elevations during clinic visits attributed to anxiety. - Encouraged relaxation techniques to manage anxiety-related blood pressure elevations during clinic visits.  History of treated chronic hepatitis C Previously treated and resolved. Not currently active or contagious. Residual irreversible liver damage remains, likely  the etiology of HCC. No current symptoms or need for active treatment. - Provided reassurance regarding non-contagious status. - Advised continued  monitoring of liver function as part of HCC surveillance.   Plan - Chart reviewed, including his recent hospital admission.  He will follow-up with his PCP for his diabetes management. - He otherwise doing well, no clinical concern for liver cancer recurrence. - Follow-up in 3 months with labs and liver MRI 1 week before.  SUMMARY OF ONCOLOGIC HISTORY: Oncology History Overview Note  Cancer Staging Hepatocellular carcinoma [facility]) Staging form: Liver, AJCC 8th Edition - Clinical stage from 03/08/2021: Stage IIIA (cT3, cN0, cM0) - Signed by Lanny Callander, MD on 03/11/2021 Stage prefix: Initial diagnosis Histologic grade (G): G2 Histologic grading system: 4 grade system     Hepatocellular carcinoma (HCC)   Initial Diagnosis   Liver mass, right lobe   03/05/2021 Imaging   CT CAP  IMPRESSION: 1. Right hepatic lobe masses which are suspicious for either metastatic disease or primary malignancy such as hepatocellular carcinoma. These are suboptimally evaluated on this noncontrast exam. Consider multidisciplinary oncology consultation with potential clinical strategies of tissue sampling versus PET to direct sampling. 2. No primary malignancy identified within the chest, abdomen, or pelvis, given limitation of noncontrast exam. 3. Left nephrolithiasis. 4. Coronary artery atherosclerosis. Aortic Atherosclerosis (ICD10-I70.0). 5. Esophageal air fluid level suggests dysmotility or gastroesophageal reflux.   03/06/2021 Imaging   MRI Liver  IMPRESSION: 9.5 cm heterogeneous hypovascular mass in anterior right hepatic lobe, and 1.3 cm hypervascular mass in the posterior right hepatic lobe. Differential diagnosis includes liver metastases and multifocal hepatocellular carcinoma. Suggest correlation with serum AFP level, and possible tissue  sampling.   No evidence of abdominal metastatic disease.   03/08/2021 Initial Biopsy   A. LIVER, RIGHT, BIOPSY:  - Hepatocellular carcinoma.   COMMENT:  The hepatocellular carcinoma is moderately differentiated and associated with areas of necrosis.  Immunohistochemistry shows positivity with glypican-3 and weak staining with HepPar 1.  The tumor is negative with arginase 1, alpha-fetoprotein, CDX2, cytokeratin 7, cytokeratin 20, cytokeratin AE1/AE3, PSA, prostein and MOC31.  Polyclonal CEA shows focal canalicular pattern.  The morphology and immunophenotype are consistent with hepatocellular carcinoma.    03/08/2021 Cancer Staging   Staging form: Liver, AJCC 8th Edition - Clinical stage from 03/08/2021: Stage IIIA (cT3, cN0, cM0) - Signed by Lanny Callander, MD on 03/11/2021 Stage prefix: Initial diagnosis Histologic grade (G): G2 Histologic grading system: 4 grade system   04/28/2021 - 10/13/2021 Chemotherapy   Patient is on Treatment Plan : LUNG Atezolizumab  + Bevacizumab  q21d Maintenance     06/16/2021 Imaging   CT Abdomen  IMPRESSION: Marked interval decrease in size of the large RIGHT hepatic lobe lesion, biopsy-proven hepatocellular carcinoma. No arterial phase enhancement on early phase, vague enhancement on later phase greater than 20 Hounsfield unit enhancement on venous phase. Less pronounced arterial enhancement than on previous imaging with small peripheral foci of enhancement on arterial phase particularly along the margin of the tumor near the dome of the liver (image 12/2) 11 mm focus of enhancement in this area.   Interval response to therapy of a small lesion previously compatible with LI-RADS category 5 lesion in the posterior RIGHT hepatic lobe.   No new hepatic lesion.   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   Signs of nephrolithiasis calculus on the LEFT measuring approximately 7 mm.   Colonic diverticulosis without evidence of acute  diverticulitis.   Aortic Atherosclerosis (ICD10-I70.0).   11/02/2021 Imaging   EXAM: CT ABDOMEN AND PELVIS WITH CONTRAST   IMPRESSION: 1. Right hepatic mass has minimally decreased in  size. No new focal hepatic masses are seen. 2. New small amount of ascites in the right upper quadrant. 3. Wall thickening of the cecum and ascending colon versus normal under distension. Correlate for nonspecific colitis. 4. Nonobstructing left renal calculus. 5.  Aortic Atherosclerosis (ICD10-I70.0).   09/15/2022 Imaging   IMPRESSION: 1. No significant change in post treatment appearance of hepatic segment VIII, with a well circumscribed, intrinsically T1 hyperintense lesion of the liver dome as well as additional heterogeneous, intrinsic T1 hyperintensity just superiorly. LI-RADS TR, nonviable. 2. Following radioembolization, new background parenchymal hyperenhancement of hepatic segment VII, with faint rim enhancing, although internally nonenhancing lesions in the posterior liver dome corresponding to previously arterially hyperenhancing liver lesions. This is consistent with post treatment hyperemia without evidence of residual viable tumor. LI-RADS TR, nonviable. 3. Interval enlargement of an arterially hyperenhancing lesion in the anterior right lobe of the liver, near the margin of hepatic segment VIII, measuring 0.8 x 0.8 cm, previously no greater than 0.4 cm. This does not demonstrate clear evidence of washout or capsular enhancement. LI-RADS category 4 due to threshold growth.      Imaging     09/15/2022 Imaging    IMPRESSION: 1. No significant change in post treatment appearance of hepatic segment VIII, with a well circumscribed, intrinsically T1 hyperintense lesion of the liver dome as well as additional heterogeneous, intrinsic T1 hyperintensity just superiorly. LI-RADS TR, nonviable. 2. Following radioembolization, new background parenchymal hyperenhancement of hepatic segment  VII, with faint rim enhancing, although internally nonenhancing lesions in the posterior liver dome corresponding to previously arterially hyperenhancing liver lesions. This is consistent with post treatment hyperemia without evidence of residual viable tumor. LI-RADS TR, nonviable. 3. Interval enlargement of an arterially hyperenhancing lesion in the anterior right lobe of the liver, near the margin of hepatic segment VIII, measuring 0.8 x 0.8 cm, previously no greater than 0.4 cm. This does not demonstrate clear evidence of washout or capsular enhancement. LI-RADS category 4 due to threshold growth.     12/18/2022 Imaging    IMPRESSION: 1. Stable appearance of the previously treated segment VIII lesion. LI-RADS TR, nonviable. 2. Stable appearance of the index lesions in the posterior right hepatic dome. As before, there is early heterogeneous but relatively diffuse enhancement in the posterior right hepatic dome, likely treatment related. Index lesions documented previously are similar in size and enhancement characteristics without suspicious enhancement characteristics. LI-RADS TR, nonviable. 3. Small lesion showing arterial phase hyperenhancement in the anterior right liver (segment II right) persists. As before there is no definite washout and no enhancing capsule associated with this lesion. Given lack of interval growth, this lesion is downgraded to LI-RADS 3. Close continued follow-up recommended. 4. No new or progressive findings in the abdomen.     03/16/2024 Imaging   MR abdomen with and without contrast  IMPRESSION: 1. Redemonstrated treated appearance of the liver dome, involving segments IVA, VII, and VIII with overlying capsular retraction. A previously seen new arterially hyperenhancing lesion in hepatic segment IVA is no longer internally enhancing, and previously treated lesions in hepatic segment VII and VIII are unchanged in appearance, likewise without residual  enhancement. LI-RADS TR, nonviable. 2. No new liver lesions. 3. No evidence of lymphadenopathy or metastatic disease in the abdomen.      03/16/2024 Imaging   CT chest, abdomen, and pelvis with contrast  IMPRESSION: 1. Post treatment appearance of the liver dome, with scattered, heterogeneous areas of arterial enhancement and multiple treated lesions. Please see separately reported  same day MR examination of the abdomen for detailed comparison to prior MR and LI-RADS characterization of treated liver lesions. 2. No evidence of lymphadenopathy or extrahepatic metastatic disease in the chest, abdomen, or pelvis. 3. Prostatomegaly. 4. Colonic diverticulosis without evidence of acute diverticulitis.   Aortic Atherosclerosis (ICD10-I70.0).      Discussed the use of AI scribe software for clinical note transcription with the patient, who gave verbal consent to proceed.  History of Present Illness Shawn Bailey is a 79 year old male with stage IIIA hepatocellular carcinoma in remission who presents for routine oncology follow-up.  He remains on routine surveillance without additional oncologic treatment. The most recent imaging was 3 months ago. He denies abdominal pain or other new symptoms.  He was recently hospitalized for severe hypoglycemia after an inadvertent overdose of short-acting insulin  and was found unresponsive at home. His insulin  regimen was changed to 30 units of long-acting insulin  each morning with short-acting insulin  on a sliding scale. His blood glucose was 342 mg/dL before lunch today after only eating breakfast. He denies abdominal pain and otherwise feels well. He also started antihypertensive therapy after that hospitalization, with blood pressure generally controlled at home and occasional clinic elevations that he attributes to anxiety.  He has prior hepatitis with residual irreversible liver damage.  Aug 22, 2024: Follow-up for hepatocellular carcinoma; MRI  from August 10, 2024 showed stable post-treatment changes with no new lesions. Alpha-fetoprotein remains negative, no pain or discomfort, stable weight. Recent hypoglycemic episode in October 2025 led to fall and head injury, prompting insulin  regimen adjustment. Continues surveillance with MRI every six months.     All other systems were reviewed with the patient and are negative.  MEDICAL HISTORY:  Past Medical History:  Diagnosis Date   Depression    Diabetes mellitus without complication (HCC)    Hepatocellular carcinoma (HCC) 03/08/2021   Parasite infestation     SURGICAL HISTORY: Past Surgical History:  Procedure Laterality Date   IR 3D INDEPENDENT WKST  05/25/2022   IR 3D INDEPENDENT WKST  11/09/2023   IR ANGIOGRAM SELECTIVE EACH ADDITIONAL VESSEL  07/01/2021   IR ANGIOGRAM SELECTIVE EACH ADDITIONAL VESSEL  07/22/2021   IR ANGIOGRAM SELECTIVE EACH ADDITIONAL VESSEL  07/22/2021   IR ANGIOGRAM SELECTIVE EACH ADDITIONAL VESSEL  07/22/2021   IR ANGIOGRAM SELECTIVE EACH ADDITIONAL VESSEL  07/22/2021   IR ANGIOGRAM SELECTIVE EACH ADDITIONAL VESSEL  05/25/2022   IR ANGIOGRAM SELECTIVE EACH ADDITIONAL VESSEL  05/25/2022   IR ANGIOGRAM SELECTIVE EACH ADDITIONAL VESSEL  05/25/2022   IR ANGIOGRAM SELECTIVE EACH ADDITIONAL VESSEL  05/25/2022   IR ANGIOGRAM SELECTIVE EACH ADDITIONAL VESSEL  05/25/2022   IR ANGIOGRAM SELECTIVE EACH ADDITIONAL VESSEL  05/25/2022   IR ANGIOGRAM SELECTIVE EACH ADDITIONAL VESSEL  06/15/2022   IR ANGIOGRAM SELECTIVE EACH ADDITIONAL VESSEL  06/15/2022   IR ANGIOGRAM SELECTIVE EACH ADDITIONAL VESSEL  06/15/2022   IR ANGIOGRAM SELECTIVE EACH ADDITIONAL VESSEL  11/09/2023   IR ANGIOGRAM SELECTIVE EACH ADDITIONAL VESSEL  11/09/2023   IR ANGIOGRAM SELECTIVE EACH ADDITIONAL VESSEL  11/22/2023   IR ANGIOGRAM SELECTIVE EACH ADDITIONAL VESSEL  11/22/2023   IR ANGIOGRAM VISCERAL SELECTIVE  07/01/2021   IR ANGIOGRAM VISCERAL SELECTIVE  07/22/2021   IR ANGIOGRAM VISCERAL SELECTIVE  05/25/2022   IR  ANGIOGRAM VISCERAL SELECTIVE  06/15/2022   IR ANGIOGRAM VISCERAL SELECTIVE  11/09/2023   IR ANGIOGRAM VISCERAL SELECTIVE  11/09/2023   IR ANGIOGRAM VISCERAL SELECTIVE  11/22/2023   IR EMBO ARTERIAL NOT HEMORR HEMANG INC GUIDE ROADMAPPING  07/01/2021   IR EMBO TUMOR ORGAN ISCHEMIA INFARCT INC GUIDE ROADMAPPING  07/22/2021   IR EMBO TUMOR ORGAN ISCHEMIA INFARCT INC GUIDE ROADMAPPING  06/15/2022   IR EMBO TUMOR ORGAN ISCHEMIA INFARCT INC GUIDE ROADMAPPING  11/22/2023   IR RADIOLOGIST EVAL & MGMT  06/01/2021   IR RADIOLOGIST EVAL & MGMT  04/26/2022   IR RADIOLOGIST EVAL & MGMT  07/12/2022   IR RADIOLOGIST EVAL & MGMT  10/05/2023   IR US  GUIDE VASC ACCESS RIGHT  07/01/2021   IR US  GUIDE VASC ACCESS RIGHT  07/22/2021   IR US  GUIDE VASC ACCESS RIGHT  05/25/2022   IR US  GUIDE VASC ACCESS RIGHT  06/15/2022   IR US  GUIDE VASC ACCESS RIGHT  11/09/2023   IR US  GUIDE VASC ACCESS RIGHT  11/22/2023   NO PAST SURGERIES      I have reviewed the social history and family history with the patient and they are unchanged from previous note.  ALLERGIES:  has no known allergies.  MEDICATIONS:  Current Outpatient Medications  Medication Sig Dispense Refill   ascorbic acid  (VITAMIN C) 500 MG tablet Take 500 mg by mouth daily.     Blood Glucose Monitoring Suppl DEVI 1 each by Does not apply route as directed. Dispense based on patient and insurance preference. Use up to four times daily as directed. (FOR ICD-10 E10.9, E11.9). 1 each 0   fluticasone (FLONASE) 50 MCG/ACT nasal spray Place 2 sprays into both nostrils 2 (two) times daily as needed for allergies or rhinitis.     Glucagon  (GVOKE HYPOPEN  2-PACK) 1 MG/0.2ML SOAJ Inject 1 mg into the skin as needed for up to 2 doses (Severe low blood sugar). 0.4 mL 0   Glucose Blood (BLOOD GLUCOSE TEST STRIPS) STRP 1 each by Does not apply route as directed. Dispense based on patient and insurance preference. Use up to four times daily as directed. (FOR ICD-10 E10.9, E11.9). 100 strip 0    insulin  glargine (LANTUS ) 100 UNIT/ML injection Inject 0.3 mLs (30 Units total) into the skin daily. 10 mL 0   insulin  lispro (HUMALOG ) 100 UNIT/ML KwikPen Check your blood sugar prior to your meal, three times daily. Then depending on your blood sugar, inject insulin  as follows: Blood Glucose 121 - 150: 1 units.. BG 151 - 200: 2 units.. BG 201 - 250: 3 units.. BG 251 - 300: 5 units.. BG 301 - 350: 7 units.. BG 351 - 400: 9 units. 15 mL 11   insulin  lispro (HUMALOG ) 100 UNIT/ML KwikPen Inject 2 Units into the skin 3 (three) times daily. 15 mL 11   Insulin  Pen Needle (PEN NEEDLES 3/16) 31G X 5 MM MISC 1 each by Does not apply route with breakfast, with lunch, and with evening meal. 100 each 3   Lancet Device MISC 1 each by Does not apply route as directed. Dispense based on patient and insurance preference. Use up to four times daily as directed. (FOR ICD-10 E10.9, E11.9). 1 each 0   Lancets MISC 1 each by Does not apply route as directed. Dispense based on patient and insurance preference. Use up to four times daily as directed. (FOR ICD-10 E10.9, E11.9). 100 each 0   lisinopril  (ZESTRIL ) 5 MG tablet Take 1 tablet (5 mg total) by mouth daily. 30 tablet 0   Multiple Vitamin (MULTIVITAMIN WITH MINERALS) TABS tablet Take 1 tablet by mouth daily with breakfast.     Oxycodone  HCl 10 MG TABS Take 10 mg by mouth 4 (four) times  daily as needed (for pain).     vitamin B-12 (CYANOCOBALAMIN ) 100 MCG tablet Take 100 mcg by mouth daily.     No current facility-administered medications for this visit.   Facility-Administered Medications Ordered in Other Visits  Medication Dose Route Frequency Provider Last Rate Last Admin   0.9 %  sodium chloride  infusion   Intravenous Continuous Lanny Callander, MD        PHYSICAL EXAMINATION: ECOG PERFORMANCE STATUS: 1 - Symptomatic but completely ambulatory  Vitals:   11/20/24 1313 11/20/24 1314  BP: (!) 144/89 (!) 147/89  Pulse: 82   Resp: 17   Temp: 98 F (36.7 C)    SpO2: 100%    Wt Readings from Last 3 Encounters:  11/20/24 130 lb (59 kg)  10/01/24 130 lb 1.1 oz (59 kg)  08/22/24 133 lb 4.8 oz (60.5 kg)     GENERAL:alert, no distress and comfortable SKIN: skin color, texture, turgor are normal, no rashes or significant lesions EYES: normal, Conjunctiva are pink and non-injected, sclera clear NECK: supple, thyroid  normal size, non-tender, without nodularity LYMPH:  no palpable lymphadenopathy in the cervical, axillary  LUNGS: clear to auscultation and percussion with normal breathing effort HEART: regular rate & rhythm and no murmurs and no lower extremity edema ABDOMEN:abdomen soft, non-tender and normal bowel sounds Musculoskeletal:no cyanosis of digits and no clubbing  NEURO: alert & oriented x 3 with fluent speech, no focal motor/sensory deficits  Physical Exam    LABORATORY DATA:  I have reviewed the data as listed    Latest Ref Rng & Units 11/20/2024   12:32 PM 10/01/2024    7:31 AM 09/30/2024    4:00 PM  CBC  WBC 4.0 - 10.5 K/uL 3.4  5.6  7.1   Hemoglobin 13.0 - 17.0 g/dL 87.1  87.9  86.6   Hematocrit 39.0 - 52.0 % 37.6  35.7  39.0   Platelets 150 - 400 K/uL 106  84  117         Latest Ref Rng & Units 11/20/2024   12:32 PM 10/01/2024   12:40 PM 10/01/2024    7:31 AM  CMP  Glucose 70 - 99 mg/dL 657  850  380   BUN 8 - 23 mg/dL 17  13  14    Creatinine 0.61 - 1.24 mg/dL 9.14  9.09  9.23   Sodium 135 - 145 mmol/L 132  135  127   Potassium 3.5 - 5.1 mmol/L 4.8  3.8  4.2   Chloride 98 - 111 mmol/L 97  102  96   CO2 22 - 32 mmol/L 27  23  21    Calcium 8.9 - 10.3 mg/dL 9.7  8.9  8.7   Total Protein 6.5 - 8.1 g/dL 8.0   6.9   Total Bilirubin 0.0 - 1.2 mg/dL 0.8   1.0   Alkaline Phos 38 - 126 U/L 59   54   AST 15 - 41 U/L 52   51   ALT 0 - 44 U/L 41   34       RADIOGRAPHIC STUDIES: I have personally reviewed the radiological images as listed and agreed with the findings in the report. No results found.    Orders  Placed This Encounter  Procedures   MR LIVER W WO CONTRAST    Standing Status:   Future    Expected Date:   02/11/2025    Expiration Date:   11/20/2025    If indicated for the ordered procedure, I authorize  the administration of contrast media per Radiology protocol:   Yes    What is the patient's sedation requirement?:   No Sedation    Does the patient have a pacemaker or implanted devices?:   No    Preferred imaging location?:   [facility] (table limit - 500lbs)   All questions were answered. The patient knows to call the clinic with any problems, questions or concerns. No barriers to learning was detected. The total time spent in the appointment was 30 minutes, including review of chart and various tests results, discussions about plan of care and coordination of care plan     Onita Mattock, MD 11/20/2024     "

## 2024-11-21 LAB — AFP TUMOR MARKER: AFP, Serum, Tumor Marker: <1.8 ng/mL (ref 0.0–8.4)

## 2025-02-11 ENCOUNTER — Inpatient Hospital Stay: Attending: Hematology

## 2025-02-19 ENCOUNTER — Inpatient Hospital Stay: Attending: Hematology | Admitting: Hematology
